# Patient Record
Sex: Male | Born: 1955 | Race: Black or African American | Hispanic: No | Smoking: Former smoker
Health system: Southern US, Community
[De-identification: ages and names within clinical notes are randomized; demographics above are authoritative.]

## PROBLEM LIST (undated history)

## (undated) DIAGNOSIS — Z860100 Personal history of colon polyps, unspecified: Secondary | ICD-10-CM

## (undated) DIAGNOSIS — R945 Abnormal results of liver function studies: Secondary | ICD-10-CM

## (undated) DIAGNOSIS — R350 Frequency of micturition: Secondary | ICD-10-CM

## (undated) DIAGNOSIS — M1990 Unspecified osteoarthritis, unspecified site: Secondary | ICD-10-CM

## (undated) DIAGNOSIS — D693 Immune thrombocytopenic purpura: Secondary | ICD-10-CM

## (undated) DIAGNOSIS — F32A Depression, unspecified: Secondary | ICD-10-CM

## (undated) DIAGNOSIS — F419 Anxiety disorder, unspecified: Secondary | ICD-10-CM

## (undated) DIAGNOSIS — K219 Gastro-esophageal reflux disease without esophagitis: Secondary | ICD-10-CM

## (undated) DIAGNOSIS — Z9081 Acquired absence of spleen: Secondary | ICD-10-CM

## (undated) DIAGNOSIS — D696 Thrombocytopenia, unspecified: Secondary | ICD-10-CM

## (undated) DIAGNOSIS — R600 Localized edema: Secondary | ICD-10-CM

## (undated) DIAGNOSIS — R7989 Other specified abnormal findings of blood chemistry: Secondary | ICD-10-CM

## (undated) DIAGNOSIS — F329 Major depressive disorder, single episode, unspecified: Secondary | ICD-10-CM

## (undated) DIAGNOSIS — I10 Essential (primary) hypertension: Secondary | ICD-10-CM

## (undated) DIAGNOSIS — E785 Hyperlipidemia, unspecified: Secondary | ICD-10-CM

## (undated) DIAGNOSIS — J302 Other seasonal allergic rhinitis: Secondary | ICD-10-CM

## (undated) HISTORY — DX: Immune thrombocytopenic purpura: D69.3

## (undated) HISTORY — DX: Personal history of colonic polyps: Z86.010

## (undated) HISTORY — PX: LACERATION REPAIR: SHX5168

## (undated) HISTORY — DX: Abnormal results of liver function studies: R94.5

## (undated) HISTORY — DX: Personal history of colon polyps, unspecified: Z86.0100

## (undated) HISTORY — DX: Hyperlipidemia, unspecified: E78.5

## (undated) HISTORY — DX: Acquired absence of spleen: Z90.81

## (undated) HISTORY — DX: Essential (primary) hypertension: I10

## (undated) HISTORY — PX: POLYPECTOMY: SHX149

## (undated) HISTORY — DX: Other seasonal allergic rhinitis: J30.2

## (undated) HISTORY — DX: Other specified abnormal findings of blood chemistry: R79.89

---
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
## 2001-08-25 ENCOUNTER — Ambulatory Visit (HOSPITAL_BASED_OUTPATIENT_CLINIC_OR_DEPARTMENT_OTHER): Admission: RE | Admit: 2001-08-25 | Discharge: 2001-08-25 | Payer: Self-pay | Admitting: Orthopaedic Surgery

## 2001-08-25 HISTORY — PX: SHOULDER ARTHROSCOPY: SHX128

## 2004-10-17 ENCOUNTER — Ambulatory Visit: Payer: Self-pay | Admitting: Internal Medicine

## 2004-10-30 ENCOUNTER — Ambulatory Visit: Payer: Self-pay | Admitting: Internal Medicine

## 2005-01-15 ENCOUNTER — Ambulatory Visit: Payer: Self-pay | Admitting: Internal Medicine

## 2005-10-25 ENCOUNTER — Ambulatory Visit: Payer: Self-pay | Admitting: Internal Medicine

## 2005-12-02 ENCOUNTER — Ambulatory Visit: Payer: Self-pay | Admitting: Internal Medicine

## 2005-12-17 ENCOUNTER — Encounter: Payer: Self-pay | Admitting: Internal Medicine

## 2005-12-17 ENCOUNTER — Encounter (INDEPENDENT_AMBULATORY_CARE_PROVIDER_SITE_OTHER): Payer: Self-pay | Admitting: *Deleted

## 2005-12-17 ENCOUNTER — Ambulatory Visit: Payer: Self-pay | Admitting: Internal Medicine

## 2005-12-18 LAB — HM COLONOSCOPY: HM Colonoscopy: NORMAL

## 2006-02-21 ENCOUNTER — Ambulatory Visit: Payer: Self-pay | Admitting: Internal Medicine

## 2007-08-10 DIAGNOSIS — I10 Essential (primary) hypertension: Secondary | ICD-10-CM

## 2007-08-12 ENCOUNTER — Ambulatory Visit: Payer: Self-pay | Admitting: Internal Medicine

## 2007-08-14 LAB — CONVERTED CEMR LAB
ALT: 33 units/L (ref 0–53)
AST: 30 units/L (ref 0–37)
Albumin: 4 g/dL (ref 3.5–5.2)
Alkaline Phosphatase: 73 units/L (ref 39–117)
BUN: 16 mg/dL (ref 6–23)
Bilirubin, Direct: 0.1 mg/dL (ref 0.0–0.3)
CO2: 31 meq/L (ref 19–32)
Calcium: 9.4 mg/dL (ref 8.4–10.5)
Chloride: 103 meq/L (ref 96–112)
Cholesterol: 209 mg/dL (ref 0–200)
Creatinine, Ser: 1.4 mg/dL (ref 0.4–1.5)
Direct LDL: 128 mg/dL
GFR calc Af Amer: 68 mL/min
GFR calc non Af Amer: 56 mL/min
Glucose, Bld: 82 mg/dL (ref 70–99)
HDL: 28.2 mg/dL — ABNORMAL LOW (ref >39.0)
Potassium: 4.1 meq/L (ref 3.5–5.1)
Sodium: 141 meq/L (ref 135–145)
Total Bilirubin: 0.7 mg/dL (ref 0.3–1.2)
Total CHOL/HDL Ratio: 7.4
Total Protein: 6.6 g/dL (ref 6.0–8.3)
Triglycerides: 261 mg/dL (ref 0–149)
VLDL: 52 mg/dL — ABNORMAL HIGH (ref 0–40)

## 2007-10-14 ENCOUNTER — Ambulatory Visit: Payer: Self-pay | Admitting: Internal Medicine

## 2007-10-20 LAB — CONVERTED CEMR LAB
ALT: 62 units/L — ABNORMAL HIGH (ref 0–53)
AST: 52 units/L — ABNORMAL HIGH (ref 0–37)
Albumin: 4.2 g/dL (ref 3.5–5.2)
Alkaline Phosphatase: 81 units/L (ref 39–117)
Bilirubin, Direct: 0.1 mg/dL (ref 0.0–0.3)
Cholesterol: 175 mg/dL (ref 0–200)
HDL: 40.1 mg/dL (ref >39.0)
LDL Cholesterol: 115 mg/dL — ABNORMAL HIGH (ref 0–99)
Total Bilirubin: 0.9 mg/dL (ref 0.3–1.2)
Total CHOL/HDL Ratio: 4.4
Total Protein: 7.4 g/dL (ref 6.0–8.3)
Triglycerides: 97 mg/dL (ref 0–149)
VLDL: 19 mg/dL (ref 0–40)

## 2008-04-15 ENCOUNTER — Ambulatory Visit: Payer: Self-pay | Admitting: Internal Medicine

## 2008-04-18 LAB — CONVERTED CEMR LAB
BUN: 16 mg/dL (ref 6–23)
CO2: 30 meq/L (ref 19–32)
Calcium: 9.4 mg/dL (ref 8.4–10.5)
Chloride: 108 meq/L (ref 96–112)
Cholesterol: 193 mg/dL (ref 0–200)
Creatinine, Ser: 1.2 mg/dL (ref 0.4–1.5)
GFR calc Af Amer: 81 mL/min
GFR calc non Af Amer: 67 mL/min
Glucose, Bld: 90 mg/dL (ref 70–99)
HDL: 32.2 mg/dL — ABNORMAL LOW (ref >39.0)
LDL Cholesterol: 129 mg/dL — ABNORMAL HIGH (ref 0–99)
Potassium: 4.1 meq/L (ref 3.5–5.1)
Sodium: 141 meq/L (ref 135–145)
Total CHOL/HDL Ratio: 6
Triglycerides: 159 mg/dL — ABNORMAL HIGH (ref 0–149)
VLDL: 32 mg/dL (ref 0–40)

## 2008-04-22 ENCOUNTER — Ambulatory Visit: Payer: Self-pay | Admitting: Internal Medicine

## 2008-09-12 ENCOUNTER — Telehealth: Payer: Self-pay | Admitting: *Deleted

## 2009-03-14 ENCOUNTER — Ambulatory Visit: Payer: Self-pay | Admitting: Internal Medicine

## 2009-03-14 LAB — CONVERTED CEMR LAB
Bilirubin Urine: NEGATIVE
Ketones, urine, test strip: NEGATIVE
Specific Gravity, Urine: 1.02
Urobilinogen, UA: 0.2

## 2009-03-16 LAB — CONVERTED CEMR LAB
BUN: 14 mg/dL (ref 6–23)
Basophils Relative: 5.5 % — ABNORMAL HIGH (ref 0.0–3.0)
Bilirubin, Direct: 0 mg/dL (ref 0.0–0.3)
CO2: 30 meq/L (ref 19–32)
Chloride: 104 meq/L (ref 96–112)
Cholesterol: 162 mg/dL (ref 0–200)
Creatinine, Ser: 1.2 mg/dL (ref 0.4–1.5)
Eosinophils Absolute: 0.1 10*3/uL (ref 0.0–0.7)
Glucose, Bld: 82 mg/dL (ref 70–99)
MCHC: 33.2 g/dL (ref 30.0–36.0)
MCV: 91 fL (ref 78.0–100.0)
Monocytes Absolute: 0.5 10*3/uL (ref 0.1–1.0)
Neutrophils Relative %: 45.3 % (ref 43.0–77.0)
PSA: 1.32 ng/mL (ref 0.10–4.00)
Platelets: 265 10*3/uL (ref 150.0–400.0)
Total Bilirubin: 0.9 mg/dL (ref 0.3–1.2)
Total Protein: 7.3 g/dL (ref 6.0–8.3)
Triglycerides: 102 mg/dL (ref 0.0–149.0)

## 2009-09-12 ENCOUNTER — Ambulatory Visit: Payer: Self-pay | Admitting: Internal Medicine

## 2009-10-06 ENCOUNTER — Ambulatory Visit: Payer: Self-pay | Admitting: Internal Medicine

## 2009-10-06 LAB — CONVERTED CEMR LAB
AST: 41 units/L — ABNORMAL HIGH (ref 0–37)
Basophils Absolute: 0.1 10*3/uL (ref 0.0–0.1)
CO2: 28 meq/L (ref 19–32)
Calcium: 9 mg/dL (ref 8.4–10.5)
Chloride: 105 meq/L (ref 96–112)
Eosinophils Absolute: 0.1 10*3/uL (ref 0.0–0.7)
Glucose, Bld: 96 mg/dL (ref 70–99)
HCT: 40.3 % (ref 39.0–52.0)
Hemoglobin: 13.5 g/dL (ref 13.0–17.0)
Lymphocytes Relative: 36.8 % (ref 12.0–46.0)
Lymphs Abs: 1.4 10*3/uL (ref 0.7–4.0)
MCHC: 33.4 g/dL (ref 30.0–36.0)
MCV: 93.1 fL (ref 78.0–100.0)
Monocytes Absolute: 0.5 10*3/uL (ref 0.1–1.0)
Neutro Abs: 1.6 10*3/uL (ref 1.4–7.7)
RDW: 13.2 % (ref 11.5–14.6)
Sodium: 143 meq/L (ref 135–145)
Total Bilirubin: 0.9 mg/dL (ref 0.3–1.2)

## 2009-10-13 ENCOUNTER — Ambulatory Visit: Payer: Self-pay | Admitting: Internal Medicine

## 2010-01-15 ENCOUNTER — Ambulatory Visit: Payer: Self-pay | Admitting: Internal Medicine

## 2010-01-23 ENCOUNTER — Telehealth: Payer: Self-pay | Admitting: *Deleted

## 2010-01-23 LAB — CONVERTED CEMR LAB
Albumin: 3.9 g/dL (ref 3.5–5.2)
Total Protein: 7.3 g/dL (ref 6.0–8.3)

## 2010-04-13 ENCOUNTER — Ambulatory Visit: Payer: Self-pay | Admitting: Internal Medicine

## 2010-04-13 LAB — CONVERTED CEMR LAB
ALT: 46 units/L (ref 0–53)
AST: 42 units/L — ABNORMAL HIGH (ref 0–37)
Albumin: 4.1 g/dL (ref 3.5–5.2)
Alkaline Phosphatase: 82 units/L (ref 39–117)
CO2: 30 meq/L (ref 19–32)
Chloride: 106 meq/L (ref 96–112)
Cholesterol: 145 mg/dL (ref 0–200)
Creatinine, Ser: 1.2 mg/dL (ref 0.4–1.5)
Direct LDL: 73 mg/dL
Eosinophils Relative: 2.2 % (ref 0.0–5.0)
HCT: 39.5 % (ref 39.0–52.0)
Hemoglobin: 13.3 g/dL (ref 13.0–17.0)
Lymphocytes Relative: 27.5 % (ref 12.0–46.0)
Lymphs Abs: 1.3 10*3/uL (ref 0.7–4.0)
Monocytes Relative: 13 % — ABNORMAL HIGH (ref 3.0–12.0)
Neutro Abs: 2.6 10*3/uL (ref 1.4–7.7)
PSA: 1.86 ng/mL (ref 0.10–4.00)
Potassium: 4.1 meq/L (ref 3.5–5.1)
RBC: 4.33 M/uL (ref 4.22–5.81)
TSH: 1.25 microintl units/mL (ref 0.35–5.50)
Total Bilirubin: 0.6 mg/dL (ref 0.3–1.2)
Total CHOL/HDL Ratio: 4
VLDL: 66.2 mg/dL — ABNORMAL HIGH (ref 0.0–40.0)
WBC: 4.7 10*3/uL (ref 4.5–10.5)

## 2010-04-20 ENCOUNTER — Ambulatory Visit: Payer: Self-pay | Admitting: Internal Medicine

## 2010-06-06 ENCOUNTER — Telehealth: Payer: Self-pay | Admitting: Internal Medicine

## 2010-10-17 ENCOUNTER — Ambulatory Visit: Payer: Self-pay | Admitting: Internal Medicine

## 2010-10-17 LAB — CONVERTED CEMR LAB
Bilirubin, Direct: 0.1 mg/dL (ref 0.0–0.3)
Total Bilirubin: 0.5 mg/dL (ref 0.3–1.2)
Total CHOL/HDL Ratio: 3.9
VLDL: 26 mg/dL (ref 0–40)

## 2010-10-26 ENCOUNTER — Ambulatory Visit: Payer: Self-pay | Admitting: Internal Medicine

## 2010-12-25 NOTE — Progress Notes (Signed)
Summary: Schedule Colonoscopy  Phone Note Outgoing Call Call back at [facility] Phone [phone]   Call placed by: Harlow Mares CMA Duncan Dull),  June 06, 2010 9:57 AM Call placed to: Patient Summary of Call: patient is due for a colonoscopy, due to pt having family hx of colon cancer. Left message on patients machine to call back.  Initial call taken by: Harlow Mares CMA Duncan Dull),  June 06, 2010 9:58 AM  Follow-up for Phone Call        patient states that he will call back and schedule   Follow-up by: Harlow Mares CMA Duncan Dull),  June 13, 2010 4:15 PM

## 2010-12-25 NOTE — Assessment & Plan Note (Signed)
Summary: 6 MO ROV/MM   Vital Signs:  Patient profile:   55 year old male Height:      68 inches Weight:      211 pounds BMI:     32.20 BP sitting:   120 / 80  (right arm)  Vitals Entered By: Kathrynn Speed CMA (Apr 13, 2010 8:36 AM)  Nutrition Counseling: Patient's BMI is greater than 25 and therefore counseled on weight management options. CC: Refills, remoles of moles, Hypertension Management   History of Present Illness: Scott Waters comes in today  for follow up of multiple medical problems . HT: no se of meds  and readings seem controlled .   trying to lose weight by  small frequent meals  Allergies : helped by the allegra  no sob  or wheeze LIPIDS: no se of meds and takign regularly .  SKin tags moles around neck and right axilla get irritated with his clothes and sometimes shear off .  would like removal.     Hypertension History:      He denies headache, chest pain, palpitations, dyspnea with exertion, peripheral edema, visual symptoms, neurologic problems, syncope, and side effects from treatment.  He notes no problems with any antihypertensive medication side effects.        Positive major cardiovascular risk factors include male age 11 years old or older, hyperlipidemia, and hypertension.  Negative major cardiovascular risk factors include non-tobacco-user status.      Preventive Screening-Counseling & Management  Alcohol-Tobacco     Alcohol drinks/day: 3     Alcohol type: vodka and beer     Alcohol Counseling: to decrease amount and/or frequency of alcohol intake     Smoking Status: quit     Year Quit: 2005  Caffeine-Diet-Exercise     Caffeine use/day: 2 -3     Does Patient Exercise: no  Current Medications (verified): 1)  Lisinopril-Hydrochlorothiazide 20-12.5 Mg  Tabs (Lisinopril-Hydrochlorothiazide) .... 2  By Mouth Once Daily. 2)  Zocor 40 Mg  Tabs (Simvastatin) .Marland Kitchen.. 1 By Mouth Once Daily 3)  Glucosamine Sulfate   Powd (Glucosamine Sulfate) 4)   Acetaminophen 500 Mg  Caps (Acetaminophen) 5)  Omega-3 1000 Mg  Caps (Omega-3 Fatty Acids) 6)  Flonase 50 Mcg/act  Susp (Fluticasone Propionate) .... 2 Sprays  Each Nares Once Daily 7)  Fexofenadine Hcl 180 Mg  Tabs (Fexofenadine Hcl) .Marland Kitchen.. 1 By Mouth Once Daily  For Allergy 8)  Vitamin C 500 Mg Tabs (Ascorbic Acid) .... Once Daily  Allergies (verified): No Known Drug Allergies  Past History:  Past medical, surgical, family and social histories (including risk factors) reviewed, and no changes noted (except as noted below).  Past Medical History: Reviewed history from 03/14/2009 and no changes required. Alleriges Hypertension Allergic rhinitis Hyperlipidemia Colonic polyps, hx of 2007    Past Surgical History: Reviewed history from 03/14/2009 and no changes required. Tonsillectomy  polypectomy   Family History: Reviewed history from 03/14/2009 and no changes required. Family History of Alcoholism/Addiction Family History of Colon CA 1st degree relative <60 father 73 died 29  Family History Diabetes 1st degree relative Family History Kidney disease  dyalisi from DM ht mom  Family History Psychiatric care Family History of Stroke M 1st degree relative <50   Social History: Reviewed history from 03/14/2009 and no changes required. Occupation:  Chartered certified accountant Married Former Smoker still  tobacco free Alcohol use-yes Drug use-no Regular exercise-yes HH of 3    no pets      Caffeine use/day:  2 -3  Review of Systems  The patient denies anorexia, fever, weight gain, vision loss, chest pain, syncope, dyspnea on exertion, peripheral edema, prolonged cough, headaches, hemoptysis, abdominal pain, melena, hematochezia, severe indigestion/heartburn, hematuria, muscle weakness, transient blindness, difficulty walking, depression, unusual weight change, abnormal bleeding, enlarged lymph nodes, and angioedema.    Physical Exam  General:  Well-developed,well-nourished,in no acute distress;  alert,appropriate and cooperative throughout examination Head:  normocephalic and atraumatic.   Eyes:  vision grossly intact, pupils equal, and pupils round.   Neck:  No deformities, masses, or tenderness noted. no bruits heard Lungs:  Normal respiratory effort, chest expands symmetrically. Lungs are clear to auscultation, no crackles or wheezes. Heart:  Normal rate and regular rhythm. S1 and S2 normal without gallop, murmur, click, rub or other extra sounds. Abdomen:  Bowel sounds positive,abdomen soft and non-tender without masses, organomegaly or   noted. Pulses:  pulses intact without delay  no bruits heard  Extremities:  no clubbing cyanosis or edema  Neurologic:  grossly non focal  Skin:  turgor normal, color normal, no ecchymoses, and no petechiae.    multiple achrochordon around neck and 2 right axilla no bleeding Cervical Nodes:  No lymphadenopathy noted Psych:  Oriented X3, good eye contact, not anxious appearing, and not depressed appearing.     Impression & Recommendations:  Problem # 1:  HYPERLIPIDEMIA (ICD-272.4) due for labs  His updated medication list for this problem includes:    Zocor 40 Mg Tabs (Simvastatin) .Marland Kitchen... 1 by mouth once daily  Orders: TLB-TSH (Thyroid Stimulating Hormone) (84443-TSH) TLB-Lipid Panel (80061-LIPID) TLB-Hepatic/Liver Function Pnl (80076-HEPATIC) Venipuncture (16109)  Labs Reviewed: SGOT: 36 (01/15/2010)   SGPT: 39 (01/15/2010)  Prior 10 Yr Risk Heart Disease: 6 % (10/13/2009)   HDL:42.20 (03/14/2009), 32.2 (04/15/2008)  LDL:99 (03/14/2009), 129 (60/45/4098)  Chol:162 (03/14/2009), 193 (04/15/2008)  Trig:102.0 (03/14/2009), 159 (04/15/2008)  Problem # 2:  HYPERTENSION (ICD-401.9) Assessment: Unchanged  His updated medication list for this problem includes:    Lisinopril-hydrochlorothiazide 20-12.5 Mg Tabs (Lisinopril-hydrochlorothiazide) .Marland Kitchen... 2  by mouth once daily.  Orders: TLB-BMP (Basic Metabolic Panel-BMET)  (80048-METABOL) Venipuncture (11914)  BP today: 120/80 Prior BP: 120/80 (10/13/2009)  Prior 10 Yr Risk Heart Disease: 6 % (10/13/2009)  Labs Reviewed: K+: 4.0 (10/06/2009) Creat: : 1.2 (10/06/2009)   Chol: 162 (03/14/2009)   HDL: 42.20 (03/14/2009)   LDL: 99 (03/14/2009)   TG: 102.0 (03/14/2009)  Problem # 3:  LEUKOPENIA, MILD (ICD-288.50) no infection    recheck labs  Orders: TLB-CBC Platelet - w/Differential (85025-CBCD)  Problem # 4:  ALLERGIC RHINITIS (ICD-477.9) Assessment: Unchanged continue meds  His updated medication list for this problem includes:    Flonase 50 Mcg/act Susp (Fluticasone propionate) .Marland Kitchen... 2 sprays  each nares once daily    Fexofenadine Hcl 180 Mg Tabs (Fexofenadine hcl) .Marland Kitchen... 1 by mouth once daily  for allergy  Problem # 5:  ACROCHORDON (ICD-701.9) multiple    irritated   resched for  removal.  Complete Medication List: 1)  Lisinopril-hydrochlorothiazide 20-12.5 Mg Tabs (Lisinopril-hydrochlorothiazide) .... 2  by mouth once daily. 2)  Zocor 40 Mg Tabs (Simvastatin) .Marland Kitchen.. 1 by mouth once daily 3)  Glucosamine Sulfate Powd (Glucosamine sulfate) 4)  Acetaminophen 500 Mg Caps (Acetaminophen) 5)  Omega-3 1000 Mg Caps (Omega-3 fatty acids) 6)  Flonase 50 Mcg/act Susp (Fluticasone propionate) .... 2 sprays  each nares once daily 7)  Fexofenadine Hcl 180 Mg Tabs (Fexofenadine hcl) .Marland Kitchen.. 1 by mouth once daily  for allergy 8)  Vitamin C 500  Mg Tabs (Ascorbic acid) .... Once daily  Other Orders: TLB-PSA (Prostate Specific Antigen) (84153-PSA)  Hypertension Assessment/Plan:      The patient's hypertensive risk group is category B: At least one risk factor (excluding diabetes) with no target organ damage.  His calculated 10 year risk of coronary heart disease is 6 %.  Today's blood pressure is 120/80.  His blood pressure goal is < 140/90.  Patient Instructions: 1)  schedule for mole skin tag removals    2)  can make a slot    in afternoon that fits with his  schedule .   3)  You will be informed of lab results when available. and then  4)  plan follow up . Prescriptions: FEXOFENADINE HCL 180 MG  TABS (FEXOFENADINE HCL) 1 by mouth once daily  for allergy  #30 Tablet x 12   Entered and Authorized by:   Madelin Headings MD   Signed by:   Madelin Headings MD on 04/13/2010   Method used:   Electronically to        [facility] (retail)       [address]       [place], [place]  [id number]       Ph: [phone]       Fax: [phone]   RxID:   [id number] ZOCOR 40 MG  TABS (SIMVASTATIN) 1 by mouth once daily  #30 Tablet x 12   Entered and Authorized by:   Madelin Headings MD   Signed by:   Madelin Headings MD on 04/13/2010   Method used:   Electronically to        [facility] (retail)       [address]       [place], [place]  [id number]       Ph: [phone]       Fax: [phone]   RxID:   [id number]

## 2010-12-25 NOTE — Assessment & Plan Note (Signed)
Summary: 6 MONTH FUP//CCM   Vital Signs:  Patient profile:   55 year old male Weight:      212 pounds Temp:     100.1 degrees F oral Pulse rate:   84 / minute Pulse rhythm:   regular BP sitting:   130 / 90  (right arm) Cuff size:   regular  Vitals Entered By: Kern Reap CMA Duncan Dull) (October 26, 2010 8:39 AM)  Serial Vital Signs/Assessments:  Time      Position  BP       Pulse  Resp  Temp     By                     130/78                         Madelin Headings MD  Comments: large cuff sitting By: Madelin Headings MD   CC: follow-up visit Is Patient Diabetic? No Pain Assessment Patient in pain? no        History of Present Illness: Scott Waters comes in today  for   follow up of BP and  LIPIDs  BP doing ok and no se but ran out of med LIPDS: no se of meds  Acitivy high at work feels good no sp sob or swelling or cough. Allergy on nose stpray allegra but hard to get  cause OTC.   Preventive Screening-Counseling & Management  Alcohol-Tobacco     Alcohol drinks/day: 3     Alcohol type: vodka and beer     Alcohol Counseling: to decrease amount and/or frequency of alcohol intake     Smoking Status: quit     Year Quit: 2005  Current Problems (verified): 1)  Leukopenia, Mild  (ICD-288.50) 2)  Colonic Polyps, Hx of  (ICD-V12.72) 3)  Health Maintenance Exam, Adult  (ICD-V70.0) 4)  Hyperlipidemia  (ICD-272.4) 5)  Allergic Rhinitis  (ICD-477.9) 6)  Family History Diabetes 1st Degree Relative  (ICD-V18.0) 7)  Family History of Colon Ca 1st Degree Relative <60  (ICD-V16.0) 8)  Family History of Alcoholism/addiction  (ICD-V61.41) 9)  Hypertension  (ICD-401.9)  Allergies (verified): No Known Drug Allergies  Past History:  Past medical, surgical, family and social histories (including risk factors) reviewed, and no changes noted (except as noted below).  Past Medical History: Reviewed history from 03/14/2009 and no changes  required. Alleriges Hypertension Allergic rhinitis Hyperlipidemia Colonic polyps, hx of 2007    Past Surgical History: Reviewed history from 03/14/2009 and no changes required. Tonsillectomy  polypectomy   Family History: Reviewed history from 03/14/2009 and no changes required. Family History of Alcoholism/Addiction Family History of Colon CA 1st degree relative <60 father 20 died 25  Family History Diabetes 1st degree relative Family History Kidney disease  dyalisi from DM ht mom  Family History Psychiatric care Family History of Stroke M 1st degree relative <50   Social History: Reviewed history from 03/14/2009 and no changes required. Occupation:  Chartered certified accountant Married Former Smoker still  tobacco free Alcohol use-yes Drug use-no Regular exercise-yes HH of 3    no pets       Review of Systems       neg cv pulm gi    no fever   temp poss from  drink before   Physical Exam  General:  Well-developed,well-nourished,in no acute distress; alert,appropriate and cooperative throughout examination Head:  normocephalic and atraumatic.   Neck:  No deformities, masses,  or tenderness noted. Lungs:  Normal respiratory effort, chest expands symmetrically. Lungs are clear to auscultation, no crackles or wheezes. Heart:  Normal rate and regular rhythm. S1 and S2 normal without gallop, murmur, click, rub or other extra sounds. Extremities:  neg edema Neurologic:  non focal  Skin:  turgor normal, color normal, and no ecchymoses.   Cervical Nodes:  No lymphadenopathy noted Psych:  Oriented X3, normally interactive, and good eye contact.     Impression & Recommendations:  Problem # 1:  HYPERTENSION (ICD-401.9) out of med for a few days     monitor  His updated medication list for this problem includes:    Lisinopril-hydrochlorothiazide 20-12.5 Mg Tabs (Lisinopril-hydrochlorothiazide) .Marland Kitchen... 2  by mouth once daily.  BP today: 130/90 Prior BP: 142/82 (04/20/2010)  Prior 10 Yr Risk  Heart Disease: 6 % (10/13/2009)  Labs Reviewed: K+: 4.1 (04/13/2010) Creat: : 1.2 (04/13/2010)   Chol: 166 (10/17/2010)   HDL: 43 (10/17/2010)   LDL: 97 (10/17/2010)   TG: 132 (10/17/2010)  Problem # 2:  HYPERLIPIDEMIA (ICD-272.4) Assessment: Improved  His updated medication list for this problem includes:    Zocor 40 Mg Tabs (Simvastatin) .Marland Kitchen... 1 by mouth once daily  Labs Reviewed: SGOT: 33 (10/17/2010)   SGPT: 38 (10/17/2010)  Prior 10 Yr Risk Heart Disease: 6 % (10/13/2009)   HDL:43 (10/17/2010), 37.90 (04/13/2010)  LDL:97 (10/17/2010), 99 (03/14/2009)  Chol:166 (10/17/2010), 145 (04/13/2010)  Trig:132 (10/17/2010), 331.0 (04/13/2010)  Complete Medication List: 1)  Lisinopril-hydrochlorothiazide 20-12.5 Mg Tabs (Lisinopril-hydrochlorothiazide) .... 2  by mouth once daily. 2)  Zocor 40 Mg Tabs (Simvastatin) .Marland Kitchen.. 1 by mouth once daily 3)  Glucosamine Sulfate Powd (Glucosamine sulfate) 4)  Acetaminophen 500 Mg Caps (Acetaminophen) 5)  Omega-3 1000 Mg Caps (Omega-3 fatty acids) 6)  Flonase 50 Mcg/act Susp (Fluticasone propionate) .... 2 sprays  each nares once daily 7)  Fexofenadine Hcl 180 Mg Tabs (Fexofenadine hcl) .Marland Kitchen.. 1 by mouth once daily  for allergy 8)  Vitamin C 500 Mg Tabs (Ascorbic acid) .... Once daily  Patient Instructions: 1)  continue  medications. 2)  healthy eating  get sleep.  3)  CPX with labs  in 6 months.  Prescriptions: LISINOPRIL-HYDROCHLOROTHIAZIDE 20-12.5 MG  TABS (LISINOPRIL-HYDROCHLOROTHIAZIDE) 2  by mouth once daily.  #180 Tablet x 3   Entered and Authorized by:   Madelin Headings MD   Signed by:   Madelin Headings MD on 10/26/2010   Method used:   Electronically to        [facility] [address] (retail)       [address]       [place], [place]  [id number]       Ph: [phone]       Fax: [phone]   RxID:   [id number]    Orders Added: 1)  Est. Patient Level III [[address]253]

## 2010-12-25 NOTE — Procedures (Signed)
Summary: Colonoscopy / [facility]  Colonoscopy /  Healthcare   Imported By: Lennie Odor 05/10/2010 16:20:06  _____________________________________________________________________  External Attachment:    Type:   Image     Comment:   External Document

## 2010-12-25 NOTE — Progress Notes (Signed)
Summary: cell ph #  Phone Note Call from Patient   Caller: Spouse Summary of Call: cell ph [phone] Initial call taken by: Raechel Ache, RN,  January 23, 2010 2:48 PM  Follow-up for Phone Call        See lab results. Follow-up by: Romualdo Bolk, CMA (AAMA),  January 23, 2010 3:26 PM

## 2010-12-25 NOTE — Assessment & Plan Note (Signed)
Summary: skin tag removal--ok per dr Amauri Keefe//ccm   Vital Signs:  Patient profile:   55 year old male Weight:      214 pounds Temp:     98.6 degrees F Resp:     12 per minute BP sitting:   142 / 82  Vitals Entered By: Lynann Beaver CMA (Apr 20, 2010 4:28 PM) CC: Skin tag excision Is Patient Diabetic? No Pain Assessment Patient in pain? no        History of Present Illness: Scott Waters comesin  for skin tag removal  ( see last ov)   No change in health since last visit    Procedure Note Last Tetanus: Tdap (03/14/2009)  Skin Tag Removal: Onset of lesion: > 3 months Indication: inflamed lesion Consent signed: no  Procedure # 1: skin tag(s) removed    Location: neck and   right axilla     # lesions removed: 10    Instrument used: scissors    Anesthesia: 1% lidocaine w/epinephrine    Comment: drysol  local care   Additional Instructions: local care  antibioitc ointment   recheck as needed for this   Current Medications (verified): 1)  Lisinopril-Hydrochlorothiazide 20-12.5 Mg  Tabs (Lisinopril-Hydrochlorothiazide) .... 2  By Mouth Once Daily. 2)  Zocor 40 Mg  Tabs (Simvastatin) .Marland Kitchen.. 1 By Mouth Once Daily 3)  Glucosamine Sulfate   Powd (Glucosamine Sulfate) 4)  Acetaminophen 500 Mg  Caps (Acetaminophen) 5)  Omega-3 1000 Mg  Caps (Omega-3 Fatty Acids) 6)  Flonase 50 Mcg/act  Susp (Fluticasone Propionate) .... 2 Sprays  Each Nares Once Daily 7)  Fexofenadine Hcl 180 Mg  Tabs (Fexofenadine Hcl) .Marland Kitchen.. 1 By Mouth Once Daily  For Allergy 8)  Vitamin C 500 Mg Tabs (Ascorbic Acid) .... Once Daily  Allergies (verified): No Known Drug Allergies   Complete Medication List: 1)  Lisinopril-hydrochlorothiazide 20-12.5 Mg Tabs (Lisinopril-hydrochlorothiazide) .... 2  by mouth once daily. 2)  Zocor 40 Mg Tabs (Simvastatin) .Marland Kitchen.. 1 by mouth once daily 3)  Glucosamine Sulfate Powd (Glucosamine sulfate) 4)  Acetaminophen 500 Mg Caps (Acetaminophen) 5)  Omega-3 1000 Mg Caps  (Omega-3 fatty acids) 6)  Flonase 50 Mcg/act Susp (Fluticasone propionate) .... 2 sprays  each nares once daily 7)  Fexofenadine Hcl 180 Mg Tabs (Fexofenadine hcl) .Marland Kitchen.. 1 by mouth once daily  for allergy 8)  Vitamin C 500 Mg Tabs (Ascorbic acid) .... Once daily  Other Orders: Removal of Skin Tags up to 15 Lesions (11200)  Patient Instructions: 1)  rov in 6 months   Appended Document: skin tag removal--ok per dr Scott Waters//ccm lTell patient that labs are  normal except minor liver  elevation and  very elevated triglycerides .blood count is now normal .   Can send copy of labs to patient and advise to minimize  sweets sugars and animal fats and simple carbohydrates . Increase exercise .  ROV in 6 months as we discussed  and should repeat lipids and liver before that visit .  Appended Document: skin tag removal--ok per dr Scott Waters//ccm LMTOCB  Appended Document: skin tag removal--ok per dr Scott Waters//ccm Pt aware and will call back to schedule the lab appt 1 week prior to appt.

## 2010-12-29 ENCOUNTER — Other Ambulatory Visit: Payer: Self-pay | Admitting: Internal Medicine

## 2011-04-12 NOTE — Op Note (Signed)
[place]  Patient:    Scott Waters, Scott Waters Visit Number: [id number] MRN: [id number]          Service Type: DSU Location: [facility] Attending Physician:  Marcene Corning Proc. Date: 08/25/01 Admit Date:  08/25/2001                             Operative Report  PREOPERATIVE DIAGNOSIS:  Left shoulder impingement.  POSTOPERATIVE DIAGNOSIS:  Left shoulder impingement.  PROCEDURE:  Left shoulder arthroscopic acromioplasty.  Left shoulder arthroscopic debridement.  ANESTHESIA:  General and block.  SURGEON:  Lubertha Basque. Jerl Santos, M.D.  ASSISTANT:  Prince Rome, P.A.  INDICATIONS:  The patient is a 54 year old man with about a year of left greater than right shoulder pain.  This occurs mostly with overhead use and it makes it very difficult for him to sleep at night.  He failed some oral anti-inflammatories.  By x-ray he has a very large subacromial spur.  He is offered an arthroscopy.  The procedure was discussed with the patient and informed consent was obtained under discussion of possible complications of, reaction to anesthesia, and infection.  DESCRIPTION OF PROCEDURE:  The patient was taken to the operating room after preanesthetic block was given.  He was then given a general anesthesia and positioned in beach chair position.  He was prepped and draped in the usual sterile fashion.  After administration of preoperative IV antibiotics, an arthroscopy of the left shoulder was performed through a total of two portals. Glenohumeral joint showed no degenerative change in the biceps tendon and all labral structures were well attached.  The rotator cuff appeared benign on undersurface inspection.  From above he had some bursitis and perhaps some partial thickness cuff tearing.  This was addressed with a brief debridement. He had a very prominent subacromial morphology with a spur in the anterior aspect.  This was addressed with an acromioplasty.   The corticoacromial ligament was released, but not resected.  The acromioplasty was done with a bur in the lateral position followed by transfer of the bur to the posterior position.  The [facility] joint was not addressed as he had no pain in that area and no significant spurring on the undersurface of this joint.  The soulder was thoroughly irrigated followed by the placement of Marcaine with epinephrine and morphine.  Simple sutures of nylon were used to reapproximate the portals followed by Adaptic and a dry gauze dressing and tape.  Estimated blood loss and intraoperative fluids can be obtained from anesthesia records.  DISPOSITION:  The patient was extubated in the operating room and taken to the recovery room in stable condition.  Plans were for him to go home the same day and to follow up in the office in less than a week.  I will contact him by phone tonight. Attending Physician:  Marcene Corning DD:  08/25/01 TD:  08/25/01 Job: [id number]

## 2011-04-16 ENCOUNTER — Encounter: Payer: Self-pay | Admitting: Internal Medicine

## 2011-04-16 ENCOUNTER — Other Ambulatory Visit (INDEPENDENT_AMBULATORY_CARE_PROVIDER_SITE_OTHER): Payer: BC Managed Care – PPO | Admitting: Internal Medicine

## 2011-04-16 DIAGNOSIS — Z0000 Encounter for general adult medical examination without abnormal findings: Secondary | ICD-10-CM

## 2011-04-16 LAB — LIPID PANEL
Cholesterol: 148 mg/dL (ref 0–200)
Triglycerides: 195 mg/dL — ABNORMAL HIGH (ref 0.0–149.0)

## 2011-04-16 LAB — TSH: TSH: 1.9 u[IU]/mL (ref 0.35–5.50)

## 2011-04-16 LAB — BASIC METABOLIC PANEL
BUN: 18 mg/dL (ref 6–23)
Creatinine, Ser: 1.1 mg/dL (ref 0.4–1.5)
GFR: 88.69 mL/min (ref >60.00)
Glucose, Bld: 97 mg/dL (ref 70–99)
Potassium: 4.4 mEq/L (ref 3.5–5.1)

## 2011-04-16 LAB — POCT URINALYSIS DIPSTICK
Glucose, UA: NEGATIVE
Ketones, UA: NEGATIVE
Spec Grav, UA: 1.02

## 2011-04-16 LAB — HEPATIC FUNCTION PANEL
ALT: 47 U/L (ref 0–53)
AST: 39 U/L — ABNORMAL HIGH (ref 0–37)
Albumin: 3.9 g/dL (ref 3.5–5.2)

## 2011-04-16 LAB — CBC WITH DIFFERENTIAL/PLATELET
Basophils Absolute: 0 10*3/uL (ref 0.0–0.1)
Basophils Relative: 1.1 % (ref 0.0–3.0)
Eosinophils Relative: 5.6 % — ABNORMAL HIGH (ref 0.0–5.0)
Hemoglobin: 13.2 g/dL (ref 13.0–17.0)
Lymphocytes Relative: 35.2 % (ref 12.0–46.0)
Monocytes Relative: 16 % — ABNORMAL HIGH (ref 3.0–12.0)
Neutro Abs: 1.8 10*3/uL (ref 1.4–7.7)
RBC: 4.38 Mil/uL (ref 4.22–5.81)
WBC: 4.3 10*3/uL — ABNORMAL LOW (ref 4.5–10.5)

## 2011-04-16 LAB — PSA: PSA: 1.69 ng/mL (ref 0.10–4.00)

## 2011-04-16 NOTE — Progress Notes (Signed)
Addended by: Rossie Muskrat on: 04/16/2011 08:30 AM   Modules accepted: Orders

## 2011-04-19 ENCOUNTER — Other Ambulatory Visit: Payer: Self-pay | Admitting: Internal Medicine

## 2011-04-23 ENCOUNTER — Ambulatory Visit (INDEPENDENT_AMBULATORY_CARE_PROVIDER_SITE_OTHER): Payer: BC Managed Care – PPO | Admitting: Internal Medicine

## 2011-04-23 ENCOUNTER — Encounter: Payer: Self-pay | Admitting: Internal Medicine

## 2011-04-23 VITALS — BP 144/90 | HR 88 | Ht 67.5 in | Wt 216.0 lb

## 2011-04-23 DIAGNOSIS — M1990 Unspecified osteoarthritis, unspecified site: Secondary | ICD-10-CM

## 2011-04-23 DIAGNOSIS — Z0000 Encounter for general adult medical examination without abnormal findings: Secondary | ICD-10-CM

## 2011-04-23 DIAGNOSIS — I10 Essential (primary) hypertension: Secondary | ICD-10-CM

## 2011-04-23 DIAGNOSIS — J309 Allergic rhinitis, unspecified: Secondary | ICD-10-CM

## 2011-04-23 DIAGNOSIS — E785 Hyperlipidemia, unspecified: Secondary | ICD-10-CM

## 2011-04-23 DIAGNOSIS — Z86010 Personal history of colonic polyps: Secondary | ICD-10-CM

## 2011-04-23 DIAGNOSIS — R945 Abnormal results of liver function studies: Secondary | ICD-10-CM

## 2011-04-23 DIAGNOSIS — D72819 Decreased white blood cell count, unspecified: Secondary | ICD-10-CM

## 2011-04-23 NOTE — Patient Instructions (Signed)
Your blood pressure is elevated today. Poss from taking the ibuprofen that can interfere with the BP medication. For now stop the supplement in case there is an issue with this. Check you own readings at least 3 x per week.    Then ROV  In about  4-5 week   And bring in machine . If  BP is 155 and over call and we may add medication in addition to above.  Avoid salt and sodium products.

## 2011-04-23 NOTE — Progress Notes (Signed)
  Subjective:    Patient ID: Scott Waters, male    DOB: 1956/11/15, 55 y.o.   MRN: [id number]  HPI Comes in today for PV and also fu of medical issues . Since last visit he had a joint problem  ? Gout 2 months ago.    Right big toe  Was painful and  and used soaking and epsoms and finally went away  .  NO hx of gout.  No other redness. Has had some other foot toenail issue but this was different.   BP:  Usu 130 128     Has been taking  A supplement   For joint .   Allergy stable   Review of Systems Knees  12 system review neg cv pulmonary...nocturia x 1 snores but no OSA  Hx of right great toe injury.    Objective:   Physical Exam Physical Exam: Vital signs reviewed XLK:GMWN is a well-developed well-nourished alert cooperative   AAmale  who appears   stated age in no acute distress.  HEENT: normocephalic  traumatic , Eyes: PERRL EOM's full, conjunctiva clear, Nares: patent no deformity discharge or tenderness., Ears: no deformity EAC's clear TMs with normal landmarks. Mouth: clear OP, no lesions, edema.  Moist mucous membranes. Dentition in adequate repair. NECK: supple without masses, thyromegaly or bruit CHEST/PULM:  Clear to auscultation and percussion breath sounds equal no wheeze , rales or rhonchi. No chest wall deformities or tenderness. CV: PMI is nondisplaced, S1 S2 no gallops, murmurs, rubs. Peripheral pulses are full without delay.No JVD .  See bp readings  ABDOMEN: Bowel sounds normal nontender  No guard or rebound, no hepato splenomegal no CVA tenderness.  No hernia. Extremtities:  No clubbing cyanosis or edema, no acute joint swelling or redness no focal atrophy NEURO:  Oriented x3, cranial nerves 3-12 appear to be intact, no obvious focal weakness,gait within normal limits no abnormal reflexes or asymmetrical SKIN: No acute rashes normal turgor, color, no bruising or petechiae. PSYCH: Oriented, good eye contact, no obvious depression anxiety, cognition and judgment appear  normal. LN:  No cervical axillary or inguinal adenopathy Rectal no masses prostate 1+ no nodules    Labs reviewed  . EKG NSR  Assessment & Plan:  Preventive Health Care Counseled regarding healthy nutrition, exercise, sleep, injury prevention, calcium vit d and healthy weight .  UTD Hypertension  A bit up today Lipids look good today Joint pains  / mild djd  And using  nsaids ibu Lfts up and down poss from the nsaid or other will folllow ... WBC about the same follow yearly.

## 2011-05-24 ENCOUNTER — Ambulatory Visit (INDEPENDENT_AMBULATORY_CARE_PROVIDER_SITE_OTHER): Payer: BC Managed Care – PPO | Admitting: Internal Medicine

## 2011-05-24 ENCOUNTER — Encounter: Payer: Self-pay | Admitting: Internal Medicine

## 2011-05-24 VITALS — BP 151/104 | HR 83 | Wt 215.0 lb

## 2011-05-24 DIAGNOSIS — I10 Essential (primary) hypertension: Secondary | ICD-10-CM

## 2011-05-24 MED ORDER — VERAPAMIL HCL ER 180 MG PO TBCR: 180.0000 mg | EXTENDED_RELEASE_TABLET | Freq: Every day | ORAL | Status: DC

## 2011-05-24 NOTE — Progress Notes (Signed)
  Subjective:    Patient ID: Scott Waters, male    DOB: 09/15/1956, 55 y.o.   MRN: [id number]  HPI Patient comes in today for followup as directed for his high blood pressure. Since his last visit he has his own machine and has been checking his readings and they tend to be in the 1 40-150 range. He is taking his medicine regularly and has no side effect. New change in his health status.   Review of Systems Negative for chest pain shortness of breath syncope unusual swelling or bleeding Past history family history social history reviewed in the electronic medical record.     Objective:   Physical Exam Well-developed well-nourished in no acute distress blood pressure readings reviewed patient  Bp machine 151/104 Nursing 140/100  Left  ... MD  right 146/96        Assessment & Plan:  HT  Not controlled  Although poss slight WC phenom  Disc options  Will add on verapamil 180 hs and then follow up rov  in 2 months or so  He may be due for bmp and lfts at that point.

## 2011-05-24 NOTE — Patient Instructions (Signed)
Add on  Verapamil medication for BP  Continue monitoring bp reading.   Return in 2 months  To reevaluate but call in the meantime  If very high   160 and up.

## 2011-07-22 ENCOUNTER — Ambulatory Visit (INDEPENDENT_AMBULATORY_CARE_PROVIDER_SITE_OTHER): Payer: BC Managed Care – PPO | Admitting: Internal Medicine

## 2011-07-22 ENCOUNTER — Encounter: Payer: Self-pay | Admitting: Internal Medicine

## 2011-07-22 VITALS — BP 170/90 | HR 72 | Wt 217.0 lb

## 2011-07-22 DIAGNOSIS — I10 Essential (primary) hypertension: Secondary | ICD-10-CM

## 2011-07-22 MED ORDER — CHLORTHALIDONE 25 MG PO TABS: 25.0000 mg | ORAL_TABLET | Freq: Every day | ORAL | Status: DC

## 2011-07-22 NOTE — Patient Instructions (Addendum)
Continue the verapamil  Add low dose diuretic as we discussed  Water is ok but decrease  Salt   Sodium  And increase potassium rich foods.  Recheck in 6-8 weeks .  Or as needed  Sodium-Controlled Diet Sodium is a mineral. It is found in many foods. Sodium may be found naturally or added during the making of a food. The most common form of sodium is salt, which is made up of sodium and chloride. Reducing your sodium intake involves changing your eating habits. The following guidelines will help you reduce the sodium in your diet:  Stop using the salt shaker.   Use salt sparingly in cooking and baking.   Substitute with sodium-free seasonings and spices.   Do not use a salt substitute (potassium chloride) without your caregiver's permission.   Include a variety of fresh, unprocessed foods in your diet.   Limit the use of processed and convenience foods that are high in sodium.  USE THE FOLLOWING FOODS SPARINGLY: Breads/Starches  Commercial bread stuffing, commercial pancake or waffle mixes, coating mixes. Waffles. Croutons. Prepared (boxed or frozen) potato, rice, or noodle mixes that contain salt or sodium. Salted [place] fries or hash browns. Salted popcorn, breads, crackers, chips, or snack foods.  Vegetables  Vegetables canned with salt or prepared in cream, butter, or cheese sauces. Sauerkraut. Tomato or vegetable juices canned with salt.   Fresh vegetables are allowed if rinsed thoroughly.  Fruit  Fruit is okay to eat.  Meat and Meat Substitutes  Salted or smoked meats, such as bacon or Canadian bacon, chipped or corned beef, hot dogs, salt pork, luncheon meats, pastrami, ham, or sausage. Canned or smoked fish, poultry, or meat. Processed cheese or cheese spreads, blue or Roquefort cheese. Battered or frozen fish products. Prepared spaghetti sauce. Baked beans. Reuben sandwiches. Salted nuts. Caviar.  Milk  Limit buttermilk to 1 cup per week.  Soups and Combination  Foods  Bouillon cubes, canned or dried soups, broth, consomm. Convenience (frozen or packaged) dinners with more than 600 milligrams (mg) sodium. Pot pies, pizza, Asian food, fast food cheeseburgers, and specialty sandwiches.  Desserts and Sweets  Regular (salted) desserts, pie, commercial fruit snack pies, commercial snack cakes, canned puddings.   Eat desserts and sweets in moderation.  Fats and Oils  Gravy mixes or canned gravy. No more than 1 to 2 tablespoons of salad dressing. Chip dips.   Eat fats and oils in moderation.  Beverages  See those listed under the vegetables and milk groups.  Condiments/Miscellaneous  Ketchup, mustard, meat sauces, salsa, regular (salted) and lite soy sauce or mustard. Dill pickles, olives, meat tenderizer. Prepared horseradish or pickle relish. Dutch-processed cocoa. Baking powder or baking soda used medicinally. Worcestershire sauce. "Light" salt. Salt substitute, unless approved by your caregiver.  Document Released: 05/03/2002 Document Re-Released: 02/05/2010 [facility] Patient Information 2011 [place], [place].

## 2011-07-22 NOTE — Progress Notes (Signed)
  Subjective:    Patient ID: Scott Waters, male    DOB: [birth year], 55 y.o.   MRN: [id number]  HPI comesin for follow up of hypertension since last visit he has increased the verapamil without sig se . Bp high at home.   Also 170   TO 150 AT HOME  Stress could be contributing.  Sleep  Ok  Does use etoh every night 2-3  . No osa . No cp sob swelling  Is snot taking the Lis hctz.  ? thoguth we said to stop cause not working [employer] Neg change vision cp sob some ha feels ok. Just stresssed. Past history family history social history reviewed in the electronic medical record.     Objective:   Physical Exam  WDWN in nad  Repeat bp right arm  160/82  Large cuff sitting Wt Readings from Last 3 Encounters:  07/22/11 217 lb (98.431 kg)  05/24/11 215 lb (97.523 kg)  04/23/11 216 lb (97.977 kg)   Boots on today Chest:  Clear to A&P without wheezes rales or rhonchi CV:  S1-S2 no gallops or murmurs peripheral perfusion is normal Oriented x 3 and no noted deficits in memory, attention, and speech.  Record review  For med tracking very confusing in EPIC EHR ...  Much time spent    Assessment & Plan:  Hypertension   Not controlled but now only on monotherapy and  I don't remember nor see in record that we asked to dc the past med although not extremely effective.   No se noted. Poss miscommunication .  Disc about this limit  etoh contributing stress also dec salt in diet  And increase fruits and veges.  Will add low dose chlorthalidone and monitor the potassium  .   Take in am to avoid urinary disruption.  Total visit > 50% spent counseling and coordinating care

## 2011-07-27 NOTE — Assessment & Plan Note (Signed)
Unclear why on mono therapy poss miscommunication  Was on lis hctz without se .  Will add low dose chlorthalidone , dec sodium inc fruits veges and limit etoh and the follow up.

## 2011-08-19 ENCOUNTER — Other Ambulatory Visit: Payer: Self-pay | Admitting: Internal Medicine

## 2011-09-09 ENCOUNTER — Encounter: Payer: Self-pay | Admitting: Internal Medicine

## 2011-09-09 ENCOUNTER — Ambulatory Visit (INDEPENDENT_AMBULATORY_CARE_PROVIDER_SITE_OTHER): Payer: BC Managed Care – PPO | Admitting: Internal Medicine

## 2011-09-09 VITALS — BP 152/80 | HR 80 | Wt 215.0 lb

## 2011-09-09 DIAGNOSIS — R7989 Other specified abnormal findings of blood chemistry: Secondary | ICD-10-CM

## 2011-09-09 DIAGNOSIS — Z9189 Other specified personal risk factors, not elsewhere classified: Secondary | ICD-10-CM

## 2011-09-09 DIAGNOSIS — Z5739 Occupational exposure to other air contaminants: Secondary | ICD-10-CM

## 2011-09-09 DIAGNOSIS — R945 Abnormal results of liver function studies: Secondary | ICD-10-CM

## 2011-09-09 DIAGNOSIS — I10 Essential (primary) hypertension: Secondary | ICD-10-CM

## 2011-09-09 LAB — BASIC METABOLIC PANEL
GFR: 81.67 mL/min (ref >60.00)
Glucose, Bld: 102 mg/dL — ABNORMAL HIGH (ref 70–99)
Potassium: 3.1 mEq/L — ABNORMAL LOW (ref 3.5–5.1)
Sodium: 139 mEq/L (ref 135–145)

## 2011-09-09 LAB — HEPATIC FUNCTION PANEL
AST: 43 U/L — ABNORMAL HIGH (ref 0–37)
Albumin: 4.2 g/dL (ref 3.5–5.2)
Alkaline Phosphatase: 80 U/L (ref 39–117)
Bilirubin, Direct: 0 mg/dL (ref 0.0–0.3)

## 2011-09-09 LAB — MAGNESIUM: Magnesium: 2.1 mg/dL (ref 1.5–2.5)

## 2011-09-09 MED ORDER — VERAPAMIL HCL ER 240 MG PO TBCR: 240.0000 mg | EXTENDED_RELEASE_TABLET | Freq: Every day | ORAL | Status: DC

## 2011-09-09 NOTE — Assessment & Plan Note (Signed)
Better  Nl with large cuff slightly elevated with reg cuff.  Increase to 240 verapamil and check bmp mg today .  Continue lifestyle intervention healthy eating and exercise . And then follow up.

## 2011-09-09 NOTE — Patient Instructions (Addendum)
Increase the verapamil to 240 mg per day and stay on the chlorthalidone.   Recheck in 3 months or as needed. Will notify you  of labs when available.

## 2011-09-09 NOTE — Progress Notes (Signed)
  Subjective:    Patient ID: Scott Waters, male    DOB: 14-Feb-1956, 55 y.o.   MRN: [id number]  HPI Pt comes in for follow up of HT management.  Since last visit has begun chlorthalidone . No se but some incr urination. No cp sob  Has had ur congestion .Marland Kitchen Dust and smoked from work machines  . No fever wheezing   Review of Systems See  Above.   No cramps other change in health Past history family history social history reviewed in the electronic medical record.     Objective:   Physical Exam  Wt Readings from Last 3 Encounters:  09/09/11 215 lb (97.523 kg)  07/22/11 217 lb (98.431 kg)  05/24/11 215 lb (97.523 kg)   wdwn in nad Respirations quiet   BP readings 140/84 right reg cuff 128/72 large cuff sitting  Cv rr No clubbing cyanosis or edema.      Assessment & Plan:  HT  Better increase to 240 verapamil and Continue lifestyle intervention healthy eating and exercise .  Labs today and recheck in 3 months Occupational exposures  With resp sx  Wear mask  And consider see pulm if having any chronic sx.   Routine monitoring of lfts  Minor abnormalities.

## 2011-09-11 ENCOUNTER — Telehealth: Payer: Self-pay | Admitting: *Deleted

## 2011-09-11 DIAGNOSIS — E876 Hypokalemia: Secondary | ICD-10-CM

## 2011-09-11 MED ORDER — POTASSIUM CHLORIDE ER 10 MEQ PO TBCR: 10.0000 meq | EXTENDED_RELEASE_TABLET | Freq: Every day | ORAL | Status: DC

## 2011-09-11 NOTE — Telephone Encounter (Signed)
Pt aware of results. Rx sent to pharmacy and order placed in Epic.

## 2011-09-11 NOTE — Telephone Encounter (Signed)
Left message for pt to call back  °

## 2011-09-11 NOTE — Telephone Encounter (Signed)
Message copied by Romualdo Bolk on Wed Sep 11, 2011 10:05 AM ------      Message from: [facility], [place] [facility]      Created: Tue Sep 10, 2011  5:31 PM       Advise patient that potassium level has dropped some .  We need to add potassium supplement  20 meq  1 po qd and recheck BMP in   2-3 weeks( nonfasting ok  dont need ov if ok)  Continue other meds .            Potassium 20 meq 1 po qd disp 30 refill x 3

## 2011-10-04 ENCOUNTER — Other Ambulatory Visit (INDEPENDENT_AMBULATORY_CARE_PROVIDER_SITE_OTHER): Payer: BC Managed Care – PPO

## 2011-10-04 DIAGNOSIS — E876 Hypokalemia: Secondary | ICD-10-CM

## 2011-10-04 LAB — BASIC METABOLIC PANEL
BUN: 21 mg/dL (ref 6–23)
CO2: 29 mEq/L (ref 19–32)
Calcium: 9.1 mg/dL (ref 8.4–10.5)
Creatinine, Ser: 1.2 mg/dL (ref 0.4–1.5)
Glucose, Bld: 111 mg/dL — ABNORMAL HIGH (ref 70–99)

## 2011-11-24 ENCOUNTER — Other Ambulatory Visit: Payer: Self-pay | Admitting: Internal Medicine

## 2011-12-02 ENCOUNTER — Ambulatory Visit (INDEPENDENT_AMBULATORY_CARE_PROVIDER_SITE_OTHER): Payer: BC Managed Care – PPO | Admitting: Internal Medicine

## 2011-12-02 ENCOUNTER — Encounter: Payer: Self-pay | Admitting: Internal Medicine

## 2011-12-02 VITALS — BP 130/80 | HR 78 | Wt 218.0 lb

## 2011-12-02 DIAGNOSIS — I10 Essential (primary) hypertension: Secondary | ICD-10-CM

## 2011-12-02 NOTE — Patient Instructions (Signed)
10 # weight loss  May help  Your  BP control.   Continue same meds for now.

## 2011-12-02 NOTE — Progress Notes (Signed)
  Subjective:    Patient ID: Scott Waters, male    DOB: 09-Jul-1956, 56 y.o.   MRN: [id number]  HPI Patient comes in today for follow up of  medical problems.   HT: since last visit we there has been an increase in his  Verapamil . No problem  With this and was 128 range and up some he feels from Stress    At home.   No chest pain shortness of breath edema  Review of Systems A cyst on left knee and back no change  Past history family history social history reviewed in the electronic medical record.     Objective:   Physical Exam Wt Readings from Last 3 Encounters:  12/02/11 218 lb (98.884 kg)  09/09/11 215 lb (97.523 kg)  07/22/11 217 lb (98.431 kg)  WDWN in nad  Bp 130/80 right sitting No clubbing cyanosis or edema Chest:  Clear to A&P without wheezes rales or rhonchi CV:  S1-S2 no gallops or murmurs peripheral perfusion is normal Lipoma question left upper scapula small cyst left laterally skin superficial Last potassium 3.5     Assessment & Plan:  Hypertension  grow could be somewhat better but adequate control today. Reviewed diet sodium cutting out french fries sweet drinks etc. Weight loss of 10 pounds may be quite helpful for control to work on this.  The lipoma left scapular area. Observe recheck as needed  otherwise see BX with labs in 6 months.

## 2012-02-29 ENCOUNTER — Other Ambulatory Visit: Payer: Self-pay | Admitting: Internal Medicine

## 2012-02-29 NOTE — Telephone Encounter (Signed)
Per last office it states medication was discontinued.  Pls advise.

## 2012-03-02 NOTE — Telephone Encounter (Signed)
Call patient  And ask exactly what he is taking    As i dont remember stopping this  i dont see where we stopped it . It is poss the EHR dropped it from the list .  Would make sure he gets his refill x 6 months if  On it and correct the EHR

## 2012-03-06 NOTE — Telephone Encounter (Signed)
Left a message for pt to return call 

## 2012-03-09 NOTE — Telephone Encounter (Signed)
Pt states he has been taking the OTC potassium for about a month or two since his prescription ran out.  Pls advise.

## 2012-03-10 NOTE — Telephone Encounter (Signed)
Refill th e potassium   X 3 months   He is due for cpx with labs in July have him make appt.

## 2012-03-11 MED ORDER — POTASSIUM CHLORIDE CRYS ER 10 MEQ PO TBCR: 10.0000 meq | EXTENDED_RELEASE_TABLET | Freq: Every day | ORAL | Status: DC

## 2012-03-11 NOTE — Telephone Encounter (Signed)
Rx sent to pharmacy.  Left a message for pt to call to make a cpx appt for July.

## 2012-03-11 NOTE — Telephone Encounter (Signed)
Addended by: Azucena Freed on: 03/11/2012 11:30 AM   Modules accepted: Orders

## 2012-05-02 ENCOUNTER — Other Ambulatory Visit: Payer: Self-pay | Admitting: Internal Medicine

## 2012-06-04 ENCOUNTER — Encounter: Payer: Self-pay | Admitting: Internal Medicine

## 2012-06-04 ENCOUNTER — Ambulatory Visit (INDEPENDENT_AMBULATORY_CARE_PROVIDER_SITE_OTHER): Payer: BC Managed Care – PPO | Admitting: Internal Medicine

## 2012-06-04 VITALS — BP 178/100 | HR 79 | Temp 99.2°F | Wt 212.0 lb

## 2012-06-04 DIAGNOSIS — W57XXXA Bitten or stung by nonvenomous insect and other nonvenomous arthropods, initial encounter: Secondary | ICD-10-CM

## 2012-06-04 DIAGNOSIS — IMO0001 Reserved for inherently not codable concepts without codable children: Secondary | ICD-10-CM

## 2012-06-04 DIAGNOSIS — T148XXA Other injury of unspecified body region, initial encounter: Secondary | ICD-10-CM

## 2012-06-04 DIAGNOSIS — I10 Essential (primary) hypertension: Secondary | ICD-10-CM

## 2012-06-04 MED ORDER — DOXYCYCLINE HYCLATE 100 MG PO CAPS: 100.0000 mg | ORAL_CAPSULE | Freq: Two times a day (BID) | ORAL | Status: AC

## 2012-06-04 NOTE — Patient Instructions (Addendum)
Taking antibiotic for probable infection.  Contact us if it is not improving after 3-4 days next week or you get fever or other concerns.  Check your blood pressure to make sure it is coming down and controlled.  Second reading today was 154/88.

## 2012-06-04 NOTE — Progress Notes (Signed)
Subjective:    Patient ID: Scott Waters, male    DOB: 1956/08/25, 56 y.o.   MRN: [id number]  HPI Patient comes in today for SDA for  new problem evaluation. He was working in the yard this weekend about 4-5 days ago. pulled a tick off of his left leg  and since then has had a rash around his it is getting bigger mildly itchy somewhat irritated using hydrocortisone and Neosporin on it. He denies any systemic symptoms such as fever chills or cough. He thinks it was a regular tick and not a small deer tick. Moderate discomfort . Blood pressures been okay was a little what rushed getting here thinks his blood pressure might be up because of stress.  Review of Systems Negative for constitutional symptoms cough chest pain shortness of breath swelling joint pains. Other rashes. Rest as per history of present illness Past history family history social history reviewed in the electronic medical record.  Outpatient Encounter Prescriptions as of 06/04/2012  Medication Sig Dispense Refill  . Ascorbic Acid (VITAMIN C) 500 MG tablet Take 500 mg by mouth daily.        . chlorthalidone (HYGROTON) 25 MG tablet take 1 tablet by mouth once daily  30 tablet  2  . Cholecalciferol (VITAMIN D3) 3000 UNITS TABS Take by mouth.        . Cyanocobalamin (VITAMIN B 12) 100 MCG LOZG Take by mouth.        . fexofenadine (ALLEGRA) 180 MG tablet Take 180 mg by mouth daily.        . fish oil-omega-3 fatty acids 1000 MG capsule Take 1 g by mouth daily.        . fluticasone (FLONASE) 50 MCG/ACT nasal spray INSTILL 2 SPRAYS INTO EACH NOSTRIL ONCE DAILY  16 g  3  . glucosamine-chondroitin 500-400 MG tablet Take 1 tablet by mouth 3 (three) times daily.        Marland Kitchen ibuprofen (ADVIL,MOTRIN) 200 MG tablet Take 200 mg by mouth every 6 (six) hours as needed.        Marland Kitchen OVER THE COUNTER MEDICATION Liver aid       . OVER THE COUNTER MEDICATION Text x 180 Testosterone Booster       . potassium chloride (K-DUR) 10 MEQ tablet Take 20 mEq by  mouth daily.        . potassium chloride (KLOR-CON M10) 10 MEQ tablet Take 1 tablet (10 mEq total) by mouth daily.  30 tablet  3  . simvastatin (ZOCOR) 40 MG tablet take 1 tablet by mouth once daily  30 tablet  2  . verapamil (CALAN-SR) 240 MG CR tablet Take 1 tablet (240 mg total) by mouth at bedtime.  30 tablet  5  . vitamin A 40981 UNIT capsule Take 10,000 Units by mouth daily.        . vitamin E 100 UNIT capsule Take 100 Units by mouth daily.        Marland Kitchen DISCONTD: chlorthalidone (HYGROTON) 25 MG tablet take 1 tablet by mouth once daily  30 tablet  0       Objective:   Physical Exam BP 178/100  Pulse 79  Temp 99.2 F (37.3 C) (Oral)  Wt 212 lb (96.163 kg)  SpO2 99%  Repeat blood pressure was 150s 8/88 range Well-developed well-nourished in no acute distress looks well. Right lower extremity shows a right central with irregular beefy redness about 5 cm round with some blotchiness interrupted there is no induration  fluctuance discharge or vesicles. There is no ulcer. No joint pain or edema Cor rr      Assessment & Plan:  Tick bite  either large local reaction or secondary infection not typical of tick fever or DM but is getting worse after 4-5 days. We'll add antibiotic doxycycline 100 mg 1 by mouth twice a day for 7 days.  Expectant management contact us with fever or lack of resolution or as directed.  Ht up today.  Second reading coming down monitor at home to make sure it's in the normal range. Contact us if it is not controlled.

## 2012-06-21 ENCOUNTER — Other Ambulatory Visit: Payer: Self-pay | Admitting: Internal Medicine

## 2012-06-23 NOTE — Telephone Encounter (Signed)
Pt needs CPX.  Left message on voicemail for him to return my call.

## 2012-07-08 ENCOUNTER — Other Ambulatory Visit: Payer: Self-pay | Admitting: Internal Medicine

## 2012-09-07 ENCOUNTER — Other Ambulatory Visit: Payer: Self-pay | Admitting: Internal Medicine

## 2012-09-21 ENCOUNTER — Other Ambulatory Visit: Payer: Self-pay | Admitting: Internal Medicine

## 2012-10-01 ENCOUNTER — Ambulatory Visit (INDEPENDENT_AMBULATORY_CARE_PROVIDER_SITE_OTHER): Payer: BC Managed Care – PPO | Admitting: Internal Medicine

## 2012-10-01 ENCOUNTER — Encounter: Payer: Self-pay | Admitting: Internal Medicine

## 2012-10-01 VITALS — BP 154/92 | HR 89 | Temp 98.7°F | Wt 218.0 lb

## 2012-10-01 DIAGNOSIS — N411 Chronic prostatitis: Secondary | ICD-10-CM

## 2012-10-01 DIAGNOSIS — Z125 Encounter for screening for malignant neoplasm of prostate: Secondary | ICD-10-CM

## 2012-10-01 DIAGNOSIS — E785 Hyperlipidemia, unspecified: Secondary | ICD-10-CM

## 2012-10-01 DIAGNOSIS — D72819 Decreased white blood cell count, unspecified: Secondary | ICD-10-CM

## 2012-10-01 DIAGNOSIS — I10 Essential (primary) hypertension: Secondary | ICD-10-CM

## 2012-10-01 LAB — LIPID PANEL
Cholesterol: 129 mg/dL (ref 0–200)
HDL: 33 mg/dL — ABNORMAL LOW (ref >39.00)
LDL Cholesterol: 59 mg/dL (ref 0–99)
Triglycerides: 186 mg/dL — ABNORMAL HIGH (ref 0.0–149.0)
VLDL: 37.2 mg/dL (ref 0.0–40.0)

## 2012-10-01 LAB — CBC WITH DIFFERENTIAL/PLATELET
Basophils Absolute: 0 10*3/uL (ref 0.0–0.1)
Eosinophils Absolute: 0.2 10*3/uL (ref 0.0–0.7)
HCT: 40.1 % (ref 39.0–52.0)
Lymphs Abs: 1.1 10*3/uL (ref 0.7–4.0)
MCHC: 32.5 g/dL (ref 30.0–36.0)
MCV: 91.2 fl (ref 78.0–100.0)
Monocytes Absolute: 0.6 10*3/uL (ref 0.1–1.0)
Neutrophils Relative %: 57.3 % (ref 43.0–77.0)
Platelets: 339 10*3/uL (ref 150.0–400.0)
RDW: 14.4 % (ref 11.5–14.6)

## 2012-10-01 LAB — HEPATIC FUNCTION PANEL
ALT: 56 U/L — ABNORMAL HIGH (ref 0–53)
Bilirubin, Direct: 0.1 mg/dL (ref 0.0–0.3)
Total Bilirubin: 0.6 mg/dL (ref 0.3–1.2)

## 2012-10-01 LAB — BASIC METABOLIC PANEL
BUN: 17 mg/dL (ref 6–23)
Chloride: 103 mEq/L (ref 96–112)
Creatinine, Ser: 1.2 mg/dL (ref 0.4–1.5)
Glucose, Bld: 110 mg/dL — ABNORMAL HIGH (ref 70–99)
Potassium: 3.7 mEq/L (ref 3.5–5.1)

## 2012-10-01 MED ORDER — AMLODIPINE-OLMESARTAN 5-20 MG PO TABS: 1.0000 | ORAL_TABLET | Freq: Every day | ORAL | Status: DC

## 2012-10-01 NOTE — Patient Instructions (Addendum)
Your blood pressure is still not in control. Limit alcohol to 14 beverages a week to avoid it affecting your blood pressure. Switch ibuprofen to naproxen or Aleve-type products to reduce cardiovascular risk. These medicines can still interfere with blood pressure medications. Would stop the extra vitamins and take a regular multivitamin for men should have the majority of the ingredients  require included. Decrease processed foods with extra salt and fats. Continued to get good sleep.  Stop the verapamil potassium and chlorthalidone and change to the new medication one pill a day. Decrease the simvastatin to 20 mg per day or take 1/2 tablet to avoid drug interactions.  We will notify you of lab reports when we get that  Followup visit for your blood pressure evaluation in 4-6 weeks earlier if needed contact us if you are unable to follow through with the above plan.

## 2012-10-01 NOTE — Progress Notes (Signed)
Chief Complaint  Patient presents with  . Follow-up    HPI: Patient comes in today for follow up of  multiple medical problems.  BP has been taking meds  No se of these not checking bp readings . Fasting today and didn't take med today.  Taking a lot of Supplements  For presumption of positive health effects .  MS : for various aches and pain takes 2 ibupr in am  LIPIDS taking med no se per pt .  2-3 etoh per day.  Sleep ok   ROS: See pertinent positives and negatives per HPI. No cp sob syncope rash swelling cramps.   Past Medical History  Diagnosis Date  . Seasonal allergies   . Hypertension   . Hyperlipidemia   . Hx of colonic polyps 2007    Family History  Problem Relation Age of Onset  . Colon cancer Father 10    Died at 35  . Hypertension Mother   . Diabetes Mother   . Kidney disease Mother     diailysis from dm   . Stroke      History   Social History  . Marital Status: Married    Spouse Name: N/A    Number of Children: N/A  . Years of Education: N/A   Social History Main Topics  . Smoking status: Former Smoker    Types: Cigarettes  . Smokeless tobacco: None  . Alcohol Use: 7.0 oz/week    14 drink(s) per week  . Drug Use: No  . Sexually Active: None   Other Topics Concern  . 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  , Disp: , Rfl: ;  Cholecalciferol (VITAMIN D3) 3000 UNITS TABS, Take by mouth.  , Disp: , Rfl:  Cyanocobalamin (VITAMIN B 12) 100 MCG LOZG, Take by mouth.  , Disp: , Rfl: ;  fexofenadine (ALLEGRA) 180 MG tablet, Take 180 mg by mouth  daily.  , Disp: , Rfl: ;  fish oil-omega-3 fatty acids 1000 MG capsule, Take 1 g by mouth daily.  , Disp: , Rfl: ;  fluticasone (FLONASE) 50 MCG/ACT nasal spray, INSTILL 2 SPRAYS INTO EACH NOSTRIL ONCE DAILY, Disp: 16 g, Rfl: 3 glucosamine-chondroitin 500-400 MG tablet, Take 1 tablet by mouth 3 (three) times daily.  , Disp: , Rfl: ;  ibuprofen (ADVIL,MOTRIN) 200 MG tablet, Take 200 mg by mouth every 6 (six) hours as needed.  , Disp: , Rfl: ;  OVER THE COUNTER MEDICATION, Liver aid , Disp: , Rfl: ;  potassium chloride (K-DUR) 10 MEQ tablet, Take 20 mEq by mouth daily.  , Disp: , Rfl:  simvastatin (ZOCOR) 40 MG tablet, Take 0.5 tablets (20 mg total) by mouth at bedtime., Disp: 30 tablet, Rfl: 0;  vitamin A 96045 UNIT capsule, Take 10,000 Units by mouth daily.  , Disp: , Rfl: ;  vitamin E 100 UNIT capsule, Take 100 Units by mouth daily.  , Disp: , Rfl: ;  [DISCONTINUED] simvastatin (ZOCOR) 40 MG tablet, take 1 tablet by mouth once daily, Disp:  30 tablet, Rfl: 0  EXAM: BP 154/92  Pulse 89  Temp 98.7 F (37.1 C) (Oral)  Wt 218 lb (98.884 kg)  SpO2 97%  GENERAL: vitals reviewed and listed above, alert, oriented, appears well hydrated and in no acute distress  HEENT: atraumatic, conjunttiva clear, no obvious abnormalities on inspection of external nose and ears    NECK: no obvious masses on inspection palpation   LUNGS: clear to auscultation bilaterally, no wheezes, rales or rhonchi, good air movement  CV: HRRR, no clubbing cyanosis or  peripheral edema nl cap refill   MS: moves all extremities without noticeable focal  abnormality  PSYCH: pleasant and cooperative, no obvious depression or anxiety   ASSESSMENT AND PLAN:  Discussed the following assessment and plan:  1. HYPERLIPIDEMIA  CINNAMON PO, GINSENG PO, amLODipine-olmesartan (AZOR) 5-20 MG per tablet, Basic metabolic panel, CBC with Differential, Hepatic function panel, Lipid panel, TSH  2. LEUKOPENIA, MILD  CINNAMON PO, GINSENG PO,  amLODipine-olmesartan (AZOR) 5-20 MG per tablet, Basic metabolic panel, CBC with Differential, Hepatic function panel, Lipid panel, TSH  3. HYPERTENSION  CINNAMON PO, GINSENG PO, amLODipine-olmesartan (AZOR) 5-20 MG per tablet, Basic metabolic panel, CBC with Differential, Hepatic function panel, Lipid panel, TSH  4. Requests screen PSA   Labs monitoring today  Counseled. Total visit > 50% spent counseling and coordinating care   -Patient advised to return or notify us immediately ifnew concerns arise. Change to aleve type products  Instead of Ibuprofen .  No need for all of these supplements . lsi better  Reduce etoh use can contribute to problems. Patient Instructions  Your blood pressure is still not in control. Limit alcohol to 14 beverages a week to avoid it affecting your blood pressure. Switch ibuprofen to naproxen or Aleve-type products to reduce cardiovascular risk. These medicines can still interfere with blood pressure medications. Would stop the extra vitamins and take a regular multivitamin for men should have the majority of the ingredients  require included. Decrease processed foods with extra salt and fats. Continued to get good sleep.  Stop the verapamil potassium and chlorthalidone and change to the new medication one pill a day. Decrease the simvastatin to 20 mg per day or take 1/2 tablet to avoid drug interactions.  We will notify you of lab reports when we get that  Followup visit for your blood pressure evaluation in 4-6 weeks earlier if needed contact us if you are unable to follow through with the above plan.     Lorretta Harp

## 2012-10-29 ENCOUNTER — Encounter: Payer: Self-pay | Admitting: Internal Medicine

## 2012-10-29 ENCOUNTER — Ambulatory Visit (INDEPENDENT_AMBULATORY_CARE_PROVIDER_SITE_OTHER): Payer: BC Managed Care – PPO | Admitting: Internal Medicine

## 2012-10-29 VITALS — BP 130/80 | HR 76 | Temp 99.0°F | Wt 222.0 lb

## 2012-10-29 DIAGNOSIS — R945 Abnormal results of liver function studies: Secondary | ICD-10-CM

## 2012-10-29 DIAGNOSIS — R7989 Other specified abnormal findings of blood chemistry: Secondary | ICD-10-CM

## 2012-10-29 DIAGNOSIS — E785 Hyperlipidemia, unspecified: Secondary | ICD-10-CM

## 2012-10-29 DIAGNOSIS — I10 Essential (primary) hypertension: Secondary | ICD-10-CM

## 2012-10-29 NOTE — Patient Instructions (Signed)
Continue same medication    ROV  in another 4-5 weeks  Bring in monitor for blood pressure that you have  Limit etoh to 2 per day max .

## 2012-10-29 NOTE — Progress Notes (Signed)
Chief Complaint  Patient presents with  . Follow-up  . Hypertension    HPI: Pt comes in after change in blood pressure meds . Now on Azore and off diuretic and potassium  No se of meds samples.. Has also dec simva to 1/2 tab  .  No change.  Has dec etoh to 2-3 per night . No new sx  bp readings at homw seem to be 140 range . ROS: See pertinent positives and negatives per HPI.  Past Medical History  Diagnosis Date  . Seasonal allergies   . Hypertension   . Hyperlipidemia   . Hx of colonic polyps 2007    Family History  Problem Relation Age of Onset  . Colon cancer Father 77    Died at 49  . Hypertension Mother   . Diabetes Mother   . Kidney disease Mother     diailysis from dm   . Stroke      History   Social History  . Marital Status: Married    Spouse Name: N/A    Number of Children: N/A  . Years of Education: N/A   Social History Main Topics  . Smoking status: Former Smoker    Types: Cigarettes  . Smokeless tobacco: None  . Alcohol Use: 7.0 oz/week    14 drink(s) per week  . Drug Use: No  . Sexually Active: None   Other Topics Concern  . 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 BP on left arm large cuff 136/80 GENERAL: vitals reviewed and listed above, alert, oriented, appears well hydrated and in no acute distress  HEENT: atraumatic, conjunctiva  clear, no obvious abnormalities on inspection of external nose and ears NECK: no obvious masses on inspection palpation   LUNGS: clear to auscultation bilaterally, no wheezes, rales or rhonchi, good air movement  CV: HRRR, no clubbing cyanosis or  peripheral edema nl cap refill   MS: moves all extremities without noticeable focal  Abnormality no sig edema    PSYCH: pleasant and cooperative, no obvious depression or anxiety Lab Results  Component Value Date   WBC 4.6 10/01/2012   HGB 13.0 10/01/2012   HCT 40.1 10/01/2012   PLT 339.0 10/01/2012   GLUCOSE 110* 10/01/2012   CHOL 129 10/01/2012   TRIG 186.0* 10/01/2012   HDL 33.00* 10/01/2012   LDLDIRECT 73.0 04/13/2010   LDLCALC 59 10/01/2012   ALT 56* 10/01/2012   AST 46* 10/01/2012   NA 140 10/01/2012   K 3.7 10/01/2012   CL 103 10/01/2012   CREATININE 1.2 10/01/2012   BUN 17 10/01/2012   CO2 29 10/01/2012   TSH 1.24 10/01/2012   PSA 1.60 10/01/2012    ASSESSMENT AND PLAN:  Discussed the following assessment and plan:  1. HYPERTENSION    some better with my readings and less etoh close fu in 1 months plan labs then   2. Abnormal LFTs    will recheck at next visit inanother month  3. HYPERLIPIDEMIA    plan recheck on dec dose  of simva at next blood draw   -Patient advised to return or notify health care team  immediately if symptoms  worsen or persist or new concerns arise.  Patient Instructions  Continue same medication    ROV  in another 4-5 weeks  Bring in monitor for blood pressure that you have  Limit etoh to 2 per day max .    Lipid lfts bmp at next visit or as appropriate.  He is not on potassium but the med list prints out as active med . Cant fix it.right now. Neta Mends. Mionna Advincula M.D.

## 2012-11-25 HISTORY — PX: MULTIPLE TOOTH EXTRACTIONS: SHX2053

## 2012-11-30 ENCOUNTER — Encounter: Payer: Self-pay | Admitting: Internal Medicine

## 2012-11-30 ENCOUNTER — Ambulatory Visit (INDEPENDENT_AMBULATORY_CARE_PROVIDER_SITE_OTHER): Payer: BC Managed Care – PPO | Admitting: Internal Medicine

## 2012-11-30 VITALS — BP 148/80 | HR 74 | Temp 98.6°F | Wt 220.0 lb

## 2012-11-30 DIAGNOSIS — E785 Hyperlipidemia, unspecified: Secondary | ICD-10-CM

## 2012-11-30 DIAGNOSIS — R7989 Other specified abnormal findings of blood chemistry: Secondary | ICD-10-CM

## 2012-11-30 DIAGNOSIS — I10 Essential (primary) hypertension: Secondary | ICD-10-CM

## 2012-11-30 DIAGNOSIS — R945 Abnormal results of liver function studies: Secondary | ICD-10-CM

## 2012-11-30 LAB — BASIC METABOLIC PANEL
BUN: 16 mg/dL (ref 6–23)
Chloride: 103 mEq/L (ref 96–112)
GFR: 88.18 mL/min (ref >60.00)
Potassium: 3.5 mEq/L (ref 3.5–5.1)
Sodium: 140 mEq/L (ref 135–145)

## 2012-11-30 LAB — LIPID PANEL
Cholesterol: 157 mg/dL (ref 0–200)
LDL Cholesterol: 86 mg/dL (ref 0–99)
Total CHOL/HDL Ratio: 4
VLDL: 31.8 mg/dL (ref 0.0–40.0)

## 2012-11-30 LAB — HEPATIC FUNCTION PANEL
AST: 34 U/L (ref 0–37)
Bilirubin, Direct: 0.1 mg/dL (ref 0.0–0.3)
Total Bilirubin: 0.8 mg/dL (ref 0.3–1.2)

## 2012-11-30 MED ORDER — AMLODIPINE-OLMESARTAN 5-40 MG PO TABS: 1.0000 | ORAL_TABLET | Freq: Every day | ORAL | Status: DC

## 2012-11-30 NOTE — Progress Notes (Signed)
Chief Complaint  Patient presents with  . Follow-up    HPI: FU HT management bp readings once a week   etoh 2-3 per day.  Feeling ok.  bp readings in 150 range  No se of med urinary frequency/ no  New sx  .  ROS: See pertinent positives and negatives per HPI.  Past Medical History  Diagnosis Date  . Seasonal allergies   . Hypertension   . Hyperlipidemia   . Hx of colonic polyps 2007    Family History  Problem Relation Age of Onset  . Colon cancer Father 71    Died at 45  . Hypertension Mother   . Diabetes Mother   . Kidney disease Mother     diailysis from dm   . Stroke      History   Social History  . Marital Status: Married    Spouse Name: N/A    Number of Children: N/A  . Years of Education: N/A   Social History Main Topics  . Smoking status: Former Smoker    Types: Cigarettes  . Smokeless tobacco: None  . Alcohol Use: 7.0 oz/week    14 drink(s) per week  . Drug Use: No  . Sexually Active: None   Other Topics Concern  . 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 amLODipine-olmesartan (AZOR) 5-20 MG per tablet Take 1 tablet by mouth daily.  30 tablet  1  . Ascorbic Acid (VITAMIN C) 500 MG tablet Take 500 mg by mouth daily.        Marland Kitchen CINNAMON PO Take by mouth.      . fexofenadine (ALLEGRA) 180 MG tablet Take 180 mg by mouth daily.        . fish oil-omega-3 fatty acids 1000 MG capsule Take 1 g by mouth daily.        . fluticasone (FLONASE) 50 MCG/ACT nasal spray INSTILL 2 SPRAYS INTO EACH NOSTRIL ONCE DAILY  16 g  3  . GINSENG PO Take by mouth.      Marland Kitchen glucosamine-chondroitin 500-400 MG tablet Take 1 tablet by mouth 3 (three) times daily.        Marland Kitchen ibuprofen (ADVIL,MOTRIN) 200 MG tablet Take 200 mg by mouth every 6 (six) hours as needed.        Marland Kitchen OVER THE COUNTER  MEDICATION Liver aid       . simvastatin (ZOCOR) 40 MG tablet Take 0.5 tablets (20 mg total) by mouth at bedtime.  30 tablet  0  . vitamin A 16109 UNIT capsule Take 10,000 Units by mouth daily.        . vitamin E 100 UNIT capsule Take 100 Units by mouth daily.        . potassium chloride (K-DUR) 10 MEQ tablet Take 20 mEq by mouth daily.        . [DISCONTINUED] Cholecalciferol (VITAMIN D3) 3000 UNITS TABS Take by mouth.        . [DISCONTINUED] Cyanocobalamin (VITAMIN B 12) 100 MCG LOZG Take by mouth.          EXAM:  BP 148/80  Pulse 74  Temp 98.6 F (37 C) (Oral)  Wt 220 lb (99.791 kg)  SpO2 96%  There is no height on file to calculate BMI.  GENERAL: vitals reviewed and listed above, alert, oriented, appears well hydrated and in no acute distress  HEENT: grossly normal  NECK: no obvious masses on inspection palpation     CV: HRRR, no clubbing cyanosis or  peripheral edema nl cap refill   MS: moves all extremities without noticeable focal  abnormality  PSYCH: pleasant and cooperative, no obvious depression or anxiety  ASSESSMENT AND PLAN:  Discussed the following assessment and plan:  1. HYPERTENSION  Basic metabolic panel, Hepatic function panel, Lipid panel   back up increase to 40/5  fo rnow although may do better with inc ccb and restart diuretic tyring to minimize pill count. consider carvediloladd on also   2. HYPERLIPIDEMIA  Basic metabolic panel, Hepatic function panel, Lipid panel   check on dec dose of med  3. Abnormal LFTs  Basic metabolic panel, Hepatic function panel, Lipid panel    -Patient advised to return or notify health care team  immediately if symptoms worsen or persist or new concerns arise.  Patient Instructions  Increase to new samples azor 40 / 5      There  Is a higher dose of 40 10 also that is avaialbe .   rov in another 6 weeks   May need to add diuretic back into mix or other med  .  Will notify you  of labs when  available.    Declined flu vaccine today   Burna Mortimer K. Mikiah Durall M.D.

## 2012-11-30 NOTE — Addendum Note (Signed)
Addended byMadelin Headings on: 11/30/2012 09:42 AM   Modules accepted: Orders

## 2012-11-30 NOTE — Patient Instructions (Signed)
Increase to new samples azor 40 / 5      There  Is a higher dose of 40 10 also that is avaialbe .   rov in another 6 weeks   May need to add diuretic back into mix or other med  .  Will notify you  of labs when available.

## 2012-12-02 NOTE — Progress Notes (Signed)
Quick Note:  Left a message for pt to return call. ______ 

## 2012-12-04 ENCOUNTER — Other Ambulatory Visit: Payer: Self-pay | Admitting: Internal Medicine

## 2013-01-11 ENCOUNTER — Ambulatory Visit (INDEPENDENT_AMBULATORY_CARE_PROVIDER_SITE_OTHER): Payer: BC Managed Care – PPO | Admitting: Internal Medicine

## 2013-01-11 ENCOUNTER — Encounter: Payer: Self-pay | Admitting: Internal Medicine

## 2013-01-11 VITALS — BP 158/98 | HR 90 | Temp 98.8°F | Wt 222.0 lb

## 2013-01-11 DIAGNOSIS — E669 Obesity, unspecified: Secondary | ICD-10-CM

## 2013-01-11 DIAGNOSIS — R945 Abnormal results of liver function studies: Secondary | ICD-10-CM

## 2013-01-11 DIAGNOSIS — I10 Essential (primary) hypertension: Secondary | ICD-10-CM

## 2013-01-11 DIAGNOSIS — R7989 Other specified abnormal findings of blood chemistry: Secondary | ICD-10-CM

## 2013-01-11 MED ORDER — HYDROCHLOROTHIAZIDE 25 MG PO TABS: 25.0000 mg | ORAL_TABLET | Freq: Every day | ORAL | Status: DC

## 2013-01-11 MED ORDER — AMLODIPINE-OLMESARTAN 10-40 MG PO TABS: 1.0000 | ORAL_TABLET | Freq: Every day | ORAL | Status: DC

## 2013-01-11 NOTE — Assessment & Plan Note (Signed)
Change ot azore 40 10 and add hctz    Check 4-6 weeks

## 2013-01-11 NOTE — Progress Notes (Signed)
Chief Complaint  Patient presents with  . Follow-up    Patient is not sure whether he should be taking potassium.    HPI: Fu bp and  lfts  Since last visit    Since last visit no change no cp sob . Not losing weight etoh some less then sometimes 3 -4 drinks per night . Taking sample azore 5 40 no se feels fine. bp readings a t home are in the 160 range!.  taking 20 simva no change not taking potassium ROS: See pertinent positives and negatives per HPI.  Past Medical History  Diagnosis Date  . Seasonal allergies   . Hypertension   . Hyperlipidemia   . Hx of colonic polyps 2007  . Gout 04/27/2011    One episode of joint pain at [facility]   Presumed    . Abnormal LFTs 04/27/2011    Family History  Problem Relation Age of Onset  . Colon cancer Father 27    Died at 53  . Hypertension Mother   . Diabetes Mother   . Kidney disease Mother     diailysis from dm   . Stroke      History   Social History  . Marital Status: Married    Spouse Name: N/A    Number of Children: N/A  . Years of Education: N/A   Social History Main Topics  . Smoking status: Former Smoker    Types: Cigarettes  . Smokeless tobacco: None  . Alcohol Use: 7.0 oz/week    14 drink(s) per week  . Drug Use: No  . Sexually Active: None   Other Topics Concern  . 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 amLODipine-olmesartan (AZOR) 10-40 MG per tablet Take 1 tablet by mouth daily.  42 tablet  0  . Ascorbic Acid (VITAMIN C) 500 MG tablet Take 500 mg by mouth daily.        Marland Kitchen CINNAMON PO Take by mouth.      . fexofenadine (ALLEGRA) 180 MG tablet Take 180 mg by mouth daily.        . fish oil-omega-3 fatty acids 1000 MG capsule Take 1 g by mouth daily.        . fluticasone (FLONASE) 50 MCG/ACT nasal  spray INSTILL 2 SPRAYS INTO EACH NOSTRIL ONCE DAILY  16 g  3  . GINSENG PO Take by mouth.      Marland Kitchen glucosamine-chondroitin 500-400 MG tablet Take 1 tablet by mouth 3 (three) times daily.        . hydrochlorothiazide (HYDRODIURIL) 25 MG tablet Take 1 tablet (25 mg total) by mouth daily.  90 tablet  3  . ibuprofen (ADVIL,MOTRIN) 200 MG tablet Take 200 mg by mouth every 6 (six) hours as needed.        Marland Kitchen OVER THE COUNTER MEDICATION Liver aid       . potassium chloride (K-DUR) 10 MEQ tablet Take 20 mEq by mouth daily.        . simvastatin (ZOCOR) 40 MG tablet Take 0.5 tablets (20 mg total) by mouth at bedtime.  30 tablet  0  . vitamin A 96295 UNIT capsule Take 10,000 Units by mouth daily.        Marland Kitchen  vitamin E 100 UNIT capsule Take 100 Units by mouth daily.        . [DISCONTINUED] amLODipine-olmesartan (AZOR) 5-40 MG per tablet Take 1 tablet by mouth daily.  56 tablet  0  . [DISCONTINUED] simvastatin (ZOCOR) 40 MG tablet take 1 tablet by mouth once daily  30 tablet  1   No facility-administered encounter medications on file as of 01/11/2013.    EXAM:  BP 158/98  Pulse 90  Temp(Src) 98.8 F (37.1 C) (Oral)  Wt 222 lb (100.699 kg)  BMI 34.24 kg/m2  SpO2 98%  Body mass index is 34.24 kg/(m^2).  GENERAL: vitals reviewed and listed above, alert, oriented, appears well hydrated and in no acute distress  HEENT: atraumatic, conjunctiva  clear, no obvious abnormalities on inspection of external nose and ears   NECK: no obvious masses on inspection palpation  No jvd   LUNGS: clear to auscultation bilaterally, no wheezes, rales or rhonchi, good air movement  CV: HRRR, no clubbing cyanosis trc   peripheral edema nl cap refill   MS: moves all extremities without noticeable focal  abnormality  PSYCH: pleasant and cooperative, no obvious depression or anxiety   Chemistry      Component Value Date/Time   NA 140 11/30/2012 0859   K 3.5 11/30/2012 0859   CL 103 11/30/2012 0859   CO2 26 11/30/2012 0859    BUN 16 11/30/2012 0859   CREATININE 1.1 11/30/2012 0859      Component Value Date/Time   CALCIUM 9.3 11/30/2012 0859   ALKPHOS 87 11/30/2012 0859   AST 34 11/30/2012 0859   ALT 43 11/30/2012 0859   BILITOT 0.8 11/30/2012 0859     Wt Readings from Last 3 Encounters:  01/11/13 222 lb (100.699 kg)  11/30/12 220 lb (99.791 kg)  10/29/12 222 lb (100.699 kg)    ASSESSMENT AND PLAN:  Discussed the following assessment and plan:  HYPERTENSION - not better  maximize amol dose and ad back diuretic sampels of azore 10 40 and hcts 25 given fu 4-6 wks  Abnormal LFTs - better at last check  Obesity, unspecified - weight loss will help  If not responding to max of 3 meds  consider resistant ?    Consider  Carvedilol  potass sparing. Clonidine  But tring to minimized se  -Patient advised to return or notify health care team  if symptoms worsen or persist or new concerns arise.  Patient Instructions  xchange dosage  Of azore   And add back the extra fuid pill( a different one)     ROV in 1 month at that time will recheck potassium   Level and bp    Neta Mends. Tishara Pizano M.D.

## 2013-01-11 NOTE — Patient Instructions (Addendum)
xchange dosage  Of azore   And add back the extra fuid pill( a different one)     ROV in 1 month at that time will recheck potassium   Level and bp

## 2013-02-08 ENCOUNTER — Encounter: Payer: Self-pay | Admitting: Internal Medicine

## 2013-02-08 ENCOUNTER — Ambulatory Visit (INDEPENDENT_AMBULATORY_CARE_PROVIDER_SITE_OTHER): Payer: BC Managed Care – PPO | Admitting: Internal Medicine

## 2013-02-08 VITALS — BP 154/70 | HR 92 | Temp 99.2°F | Wt 217.0 lb

## 2013-02-08 DIAGNOSIS — I10 Essential (primary) hypertension: Secondary | ICD-10-CM

## 2013-02-08 LAB — MAGNESIUM: Magnesium: 2.1 mg/dL (ref 1.5–2.5)

## 2013-02-08 LAB — BASIC METABOLIC PANEL
BUN: 19 mg/dL (ref 6–23)
Creatinine, Ser: 1.1 mg/dL (ref 0.4–1.5)
GFR: 86.31 mL/min (ref >60.00)
Potassium: 3.5 mEq/L (ref 3.5–5.1)

## 2013-02-08 MED ORDER — AMLODIPINE-OLMESARTAN 10-40 MG PO TABS: 1.0000 | ORAL_TABLET | Freq: Every day | ORAL | Status: DC

## 2013-02-08 NOTE — Patient Instructions (Addendum)
Check bp readings out of office and record ; Sounds like you need to get a different BP monitor cuff.  It is  possible  You  Have a component of   White coat syndrome.  Will notify you  of labs when available. To check potassium.   Levels.    Wellness visit in 2 months   ( no labs  )  BRING YOUR MONITOR WITH YOU WHEN YOU COME>

## 2013-02-08 NOTE — Progress Notes (Signed)
Chief Complaint  Patient presents with  . Follow-up    HPI: Pt here for ht fu.  Since his last visit he has been taking medication every day a sore 1040 and HCTZ except for today.  At health screening    this past week his blood pressure Was 128 range .  hasnt checked at home . The cuff doesn't adhere well to his arm azore 10 /40 and hctz 25  Since last visit  Not on  Potassium; ran out and for a while did otc but prob hasnt been on this for a few months .  No muscle cramps but does get some numbness when he wakes up in the morning on some of his extremities that goes away with position change.  Is down to 2 alcohol beverages a day. Asks about prostate screening.   ROS: See pertinent positives and negatives per HPI.  Past Medical History  Diagnosis Date  . Seasonal allergies   . Hypertension   . Hyperlipidemia   . Hx of colonic polyps 2007  . Gout 04/27/2011    One episode of joint pain at [facility]   Presumed    . Abnormal LFTs 04/27/2011    Family History  Problem Relation Age of Onset  . Colon cancer Father 2    Died at 58  . Hypertension Mother   . Diabetes Mother   . Kidney disease Mother     diailysis from dm   . Stroke      History   Social History  . Marital Status: Married    Spouse Name: N/A    Number of Children: N/A  . Years of Education: N/A   Social History Main Topics  . Smoking status: Former Smoker    Types: Cigarettes  . Smokeless tobacco: None  . Alcohol Use: 7.0 oz/week    14 drink(s) per week  . Drug Use: No  . Sexually Active: None   Other Topics Concern  . 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 amLODipine-olmesartan (AZOR) 10-40 MG per tablet Take 1 tablet by mouth daily.  30 tablet  6  . Ascorbic Acid (VITAMIN C) 500  MG tablet Take 500 mg by mouth daily.        Marland Kitchen CINNAMON PO Take by mouth.      . fexofenadine (ALLEGRA) 180 MG tablet Take 180 mg by mouth daily.        . fish oil-omega-3 fatty acids 1000 MG capsule Take 1 g by mouth daily.        . fluticasone (FLONASE) 50 MCG/ACT nasal spray INSTILL 2 SPRAYS INTO EACH NOSTRIL ONCE DAILY  16 g  3  . GINSENG PO Take by mouth.      Marland Kitchen glucosamine-chondroitin 500-400 MG tablet Take 1 tablet by mouth 3 (three) times daily.        . hydrochlorothiazide (HYDRODIURIL) 25 MG tablet Take 1 tablet (25 mg total) by mouth daily.  90 tablet  3  . ibuprofen (ADVIL,MOTRIN) 200 MG tablet Take 200 mg by mouth every 6 (six) hours as needed.        Marland Kitchen OVER THE COUNTER MEDICATION Liver aid       .  simvastatin (ZOCOR) 40 MG tablet Take 0.5 tablets (20 mg total) by mouth at bedtime.  30 tablet  0  . vitamin A 16109 UNIT capsule Take 10,000 Units by mouth daily.        . vitamin E 100 UNIT capsule Take 100 Units by mouth daily.        . [DISCONTINUED] amLODipine-olmesartan (AZOR) 10-40 MG per tablet Take 1 tablet by mouth daily.  42 tablet  0  . potassium chloride (K-DUR) 10 MEQ tablet Take 20 mEq by mouth daily.         No facility-administered encounter medications on file as of 02/08/2013.    EXAM:  BP 154/70  Pulse 92  Temp(Src) 99.2 F (37.3 C) (Oral)  Wt 217 lb (98.431 kg)  BMI 33.47 kg/m2  SpO2 98%  Body mass index is 33.47 kg/(m^2).  GENERAL: vitals reviewed and listed above, alert, oriented, appears well hydrated and in no acute distress  HEENT: atraumatic, conjunctiva  clear, no obvious abnormalities on inspection of external nose and ears  NECK: no obvious masses on inspection palpation   LUNGS: clear to auscultation bilaterally, no wheezes, rales or rhonchi, good air movement  CV: HRRR, no clubbing cyanosis or  peripheral edema nl cap refill   MS: moves all extremities without noticeable focal  abnormality  PSYCH: pleasant and cooperative, no obvious  depression or anxiety Lab Results  Component Value Date   WBC 4.6 10/01/2012   HGB 13.0 10/01/2012   HCT 40.1 10/01/2012   PLT 339.0 10/01/2012   GLUCOSE 94 11/30/2012   CHOL 157 11/30/2012   TRIG 159.0* 11/30/2012   HDL 39.50 11/30/2012   LDLDIRECT 73.0 04/13/2010   LDLCALC 86 11/30/2012   ALT 43 11/30/2012   AST 34 11/30/2012   NA 140 11/30/2012   K 3.5 11/30/2012   CL 103 11/30/2012   CREATININE 1.1 11/30/2012   BUN 16 11/30/2012   CO2 26 11/30/2012   TSH 1.24 10/01/2012   PSA 1.60 10/01/2012    ASSESSMENT AND PLAN:  Discussed the following assessment and plan:  HYPERTENSION poss WC effect. - Possible white coat effect based on patient information productive bring his machine today important to document out of office readings. - Plan: Basic metabolic panel, Magnesium Samples and rx and coupon.  For now  Check k and decide on  potass supp plan.   Contact us if blood pressure readings are elevated at home. Until he comes back in in 2 months. -Patient advised to return or notify health care team  if symptoms worsen or persist or new concerns arise. Reviewed record for patient PSA was done about 6 months ago was normal uncertain when his last wellness visit was and prostate exam. We'll plan for this at the next check. Patient Instructions  Check bp readings out of office and record ; Sounds like you need to get a different BP monitor cuff.  It is  possible  You  Have a component of   White coat syndrome.  Will notify you  of labs when available. To check potassium.   Levels.    Wellness visit in 2 months   ( no labs  )  Teacher, adult education MONITOR WITH YOU WHEN YOU COME>       Neta Mends. Panosh M.D.  We'll decide on potassium refill depending on lab tests.

## 2013-04-01 ENCOUNTER — Other Ambulatory Visit: Payer: Self-pay | Admitting: Internal Medicine

## 2013-04-12 ENCOUNTER — Ambulatory Visit (INDEPENDENT_AMBULATORY_CARE_PROVIDER_SITE_OTHER): Payer: BC Managed Care – PPO | Admitting: Internal Medicine

## 2013-04-12 VITALS — BP 142/82 | HR 77 | Temp 98.5°F | Ht 67.75 in | Wt 214.0 lb

## 2013-04-12 DIAGNOSIS — I10 Essential (primary) hypertension: Secondary | ICD-10-CM

## 2013-04-12 DIAGNOSIS — Z860100 Personal history of colon polyps, unspecified: Secondary | ICD-10-CM

## 2013-04-12 DIAGNOSIS — R609 Edema, unspecified: Secondary | ICD-10-CM

## 2013-04-12 DIAGNOSIS — E785 Hyperlipidemia, unspecified: Secondary | ICD-10-CM

## 2013-04-12 DIAGNOSIS — Z0000 Encounter for general adult medical examination without abnormal findings: Secondary | ICD-10-CM

## 2013-04-12 NOTE — Progress Notes (Signed)
Chief Complaint  Patient presents with  . Annual Exam    Pt is not sure if he is taking potassium.    HPI: Patient comes in today for Preventive Health Care visit    a bit of swelling right leg at end of day.  No injury   2 ibuprofen .     Feet hurt  Standing all day.   etoh still 6 + many days with son   Taking mht meds no cp sob no se of meds   hcm ? Dur for colon check hx of polyps  Scott Waters   Lipids no se of med reported   Ran out of potassium and thus not taking and uncertain if should take it  ROS:  GEN/ HEENT: No fever, significant weight changes sweats headaches vision problems hearing changes, CV/ PULM; No chest pain shortness of breath cough, syncope,edema  change in exercise tolerance. GI /GU: No adominal pain, vomiting, change in bowel habits. No blood in the stool. No significant GU symptoms. SKIN/HEME: ,no acute skin rashes suspicious lesions or bleeding. No lymphadenopathy, nodules, masses.  NEURO/ PSYCH:  No neurologic signs such as weakness numbness. No depression anxiety. IMM/ Allergy: No unusual infections.  Allergy .   REST of 12 system review negative except as per HPI   Past Medical History  Diagnosis Date  . Seasonal allergies   . Hypertension   . Hyperlipidemia   . Hx of colonic polyps 2007  . Gout 04/27/2011    One episode of joint pain at [facility]   Presumed    . Abnormal LFTs 04/27/2011    Family History  Problem Relation Age of Onset  . Colon cancer Father 5    Died at 5  . Hypertension Mother   . Diabetes Mother   . Kidney disease Mother     diailysis from dm   . Stroke      History   Social History  . Marital Status: Married    Spouse Name: N/A    Number of Children: N/A  . Years of Education: N/A   Social History Main Topics  . Smoking status: Former Smoker    Types: Cigarettes  . Smokeless tobacco: Not on file  . Alcohol Use: 7.0 oz/week    14 drink(s) per week  . Drug Use: No  . Sexually Active: Not on file   Other Topics  Concern  . 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 amLODipine-olmesartan (AZOR) 10-40 MG per tablet Take 1 tablet by mouth daily.  30 tablet  6  . Ascorbic Acid (VITAMIN C) 500 MG tablet Take 500 mg by mouth daily.        Marland Kitchen CINNAMON PO Take by mouth.      . fexofenadine (ALLEGRA) 180 MG tablet Take 180 mg by mouth daily.        . fish oil-omega-3 fatty acids 1000 MG capsule Take 1 g by mouth daily.        . fluticasone (FLONASE) 50 MCG/ACT nasal spray INSTILL 2 SPRAYS INTO EACH NOSTRIL ONCE DAILY  16 g  3  . GINSENG PO Take by mouth.      Marland Kitchen glucosamine-chondroitin 500-400  MG tablet Take 1 tablet by mouth 3 (three) times daily.        . hydrochlorothiazide (HYDRODIURIL) 25 MG tablet Take 1 tablet (25 mg total) by mouth daily.  90 tablet  3  . ibuprofen (ADVIL,MOTRIN) 200 MG tablet Take 200 mg by mouth every 6 (six) hours as needed.        Marland Kitchen OVER THE COUNTER MEDICATION Liver aid       . simvastatin (ZOCOR) 40 MG tablet take 1 tablet by mouth once daily  30 tablet  10  . vitamin A 65784 UNIT capsule Take 10,000 Units by mouth daily.        . vitamin E 100 UNIT capsule Take 100 Units by mouth daily.        . potassium chloride (K-DUR) 10 MEQ tablet Take 20 mEq by mouth daily.        . [DISCONTINUED] simvastatin (ZOCOR) 40 MG tablet Take 0.5 tablets (20 mg total) by mouth at bedtime.  30 tablet  0   No facility-administered encounter medications on file as of 04/12/2013.    EXAM:  BP 142/82  Pulse 77  Temp(Src) 98.5 F (36.9 C) (Oral)  Ht 5' 7.75" (1.721 m)  Wt 214 lb (97.07 kg)  BMI 32.77 kg/m2  SpO2 96%  Body mass index is 32.77 kg/(m^2). 144/72  142/82      Also oat monitor 152/95    Patient  Monitor right  Physical Exam: Vital signs reviewed ONG:EXBM  is a well-developed well-nourished alert cooperative   male who appears  stated age in no acute distress.  HEENT: normocephalic atraumatic , Eyes: PERRL EOM's full, conjunctiva clear, Nares: paten,t no deformity discharge or tenderness., Ears: no deformity EAC's clear TMs with normal landmarks. Mouth: clear OP, no lesions, edema.  Moist mucous membranes. Dentition in adequate repair. NECK: supple without masses, thyromegaly or bruits. CHEST/PULM:  Clear to auscultation and percussion breath sounds equal no wheeze , rales or rhonchi. No chest wall deformities or tenderness. CV: PMI is nondisplaced, S1 S2 no gallops, murmurs, rubs. Peripheral pulses are full without delay.No JVD .  ABDOMEN: Bowel sounds normal nontender  No guard or rebound, no hepato splenomegal no CVA tenderness.  No hernia. Extremtities:  No clubbing cyanosis or trc to 1 + edema  Lower exxt no redness or warmth  No ulcers or skin changes , no acute joint swelling or redness no focal atrophy NEURO:  Oriented x3, cranial nerves 3-12 appear to be intact, no obvious focal weakness,gait within normal limits no abnormal reflexes or asymmetrical SKIN: No acute rashes normal turgor, color, no bruising or petechiae. PSYCH: Oriented, good eye contact, no obvious depression anxiety, cognition and judgment appear normal. LN: no cervical axillary inguinal adenopathy Rectal prostate no nodules 1 +   Lab Results  Component Value Date   WBC 4.6 10/01/2012   HGB 13.0 10/01/2012   HCT 40.1 10/01/2012   PLT 339.0 10/01/2012   GLUCOSE 97 02/08/2013   CHOL 157 11/30/2012   TRIG 159.0* 11/30/2012   HDL 39.50 11/30/2012   LDLDIRECT 73.0 04/13/2010   LDLCALC 86 11/30/2012   ALT 43 11/30/2012   AST 34 11/30/2012   NA 139 02/08/2013   K 3.5 02/08/2013   CL 102 02/08/2013   CREATININE 1.1 02/08/2013   BUN 19 02/08/2013   CO2 29 02/08/2013   TSH 1.24 10/01/2012   PSA 1.60 10/01/2012    ASSESSMENT AND PLAN:  Discussed the following assessment and  plan:  Encounter  for preventive health examination  HYPERTENSION - monitor here reads slightly higher but farily accurate  still not optimal i think etoh contributing consider change if not working   HYPERLIPIDEMIA  COLONIC POLYPS, HX OF  Edema - seems dependent  poss med contirbutor and weight     Consider change to  Chlorthalidone instead of hctz  Patient Care Team: Madelin Headings, MD as PCP - General Hilarie Fredrickson, MD as Attending Physician (Gastroenterology) Patient Instructions  Intensify lifestyle interventions. Call and get your colonoscopy as you are  Late fore this.  Cut down on etoh.  Take    azor and  hctz  At the same time to help  Your blood pressure monitor  Reads  A bit higher than our readings with a larger cuff. But that being said  If  Better  At home then control may be better than in the office .   Labs and  OV in 4 months or as needed.     Neta Mends. Page Lancon M.D.  Health Maintenance  Topic Date Due  . Influenza Vaccine  07/26/2013  . Colonoscopy  12/19/2015  . Tetanus/tdap  03/15/2019   Health Maintenance Review

## 2013-04-12 NOTE — Patient Instructions (Addendum)
Intensify lifestyle interventions. Call and get your colonoscopy as you are  Late fore this.  Cut down on etoh.  Take    azor and  hctz  At the same time to help  Your blood pressure monitor  Reads  A bit higher than our readings with a larger cuff. But that being said  If  Better  At home then control may be better than in the office .   Labs and  OV in 4 months or as needed.

## 2013-04-18 ENCOUNTER — Encounter: Payer: Self-pay | Admitting: Internal Medicine

## 2013-06-25 ENCOUNTER — Other Ambulatory Visit: Payer: Self-pay | Admitting: Internal Medicine

## 2013-08-03 ENCOUNTER — Other Ambulatory Visit: Payer: Self-pay | Admitting: Internal Medicine

## 2013-08-06 ENCOUNTER — Other Ambulatory Visit (INDEPENDENT_AMBULATORY_CARE_PROVIDER_SITE_OTHER): Payer: BC Managed Care – PPO

## 2013-08-06 DIAGNOSIS — I10 Essential (primary) hypertension: Secondary | ICD-10-CM

## 2013-08-06 LAB — BASIC METABOLIC PANEL
BUN: 19 mg/dL (ref 6–23)
Chloride: 107 mEq/L (ref 96–112)
Creatinine, Ser: 1.4 mg/dL (ref 0.4–1.5)
GFR: 69.46 mL/min (ref >60.00)

## 2013-08-13 ENCOUNTER — Ambulatory Visit (INDEPENDENT_AMBULATORY_CARE_PROVIDER_SITE_OTHER): Payer: BC Managed Care – PPO | Admitting: Internal Medicine

## 2013-08-13 ENCOUNTER — Encounter: Payer: Self-pay | Admitting: Internal Medicine

## 2013-08-13 VITALS — BP 130/80 | HR 83 | Temp 98.3°F | Wt 205.0 lb

## 2013-08-13 DIAGNOSIS — E876 Hypokalemia: Secondary | ICD-10-CM

## 2013-08-13 DIAGNOSIS — I10 Essential (primary) hypertension: Secondary | ICD-10-CM

## 2013-08-13 MED ORDER — POTASSIUM CHLORIDE ER 10 MEQ PO TBCR: 20.0000 meq | EXTENDED_RELEASE_TABLET | Freq: Every day | ORAL | Status: DC

## 2013-08-13 NOTE — Patient Instructions (Signed)
bp is better  Add on potassium  As we discussed .  Check bmp  Level in 3-4 weeks and if ok then  Wellness in MAY  2014 .

## 2013-08-13 NOTE — Progress Notes (Signed)
Chief Complaint  Patient presents with  . Follow-up    HPI: Patient comes in today for follow up of  multiple medical problems.  Mostly for his hypertension bp at home seems ok  No se of med. Seems to be in the 12/15/1928 range. Lots of stresses work but is trying to cope with it still drinking some alcohol 1-3. No other change in his health status no cramps chest pain shortness of breath. He is no longer on the potassium. Had lab work done. ROS: See pertinent positives and negatives per HPI.  Past Medical History  Diagnosis Date  . Seasonal allergies   . Hypertension   . Hyperlipidemia   . Hx of colonic polyps 2007  . Gout 04/27/2011    One episode of joint pain at [facility]   Presumed    . Abnormal LFTs 04/27/2011    Family History  Problem Relation Age of Onset  . Colon cancer Father 34    Died at 71  . Hypertension Mother   . Diabetes Mother   . Kidney disease Mother     diailysis from dm   . Stroke      History   Social History  . Marital Status: Married    Spouse Name: N/A    Number of Children: N/A  . Years of Education: N/A   Social History Main Topics  . Smoking status: Former Smoker    Types: Cigarettes  . Smokeless tobacco: None  . Alcohol Use: 7.0 oz/week    14 drink(s) per week  . Drug Use: No  . Sexual Activity: None   Other Topics Concern  . 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 amLODipine-olmesartan (AZOR) 10-40 MG per tablet Take 1 tablet by mouth daily.  30 tablet  6  . CINNAMON PO Take by mouth.      . fexofenadine (ALLEGRA) 180 MG tablet Take 180 mg by mouth daily.        . fish oil-omega-3 fatty acids 1000 MG capsule Take 1 g by mouth daily.        . fluticasone (FLONASE) 50 MCG/ACT nasal spray INSTILL 2 SPRAYS INTO EACH  NOSTRIL ONCE DAILY  16 g  5  . GINSENG PO Take by mouth.      Marland Kitchen glucosamine-chondroitin 500-400 MG tablet Take 1 tablet by mouth 3 (three) times daily.        . hydrochlorothiazide (HYDRODIURIL) 25 MG tablet Take 1 tablet (25 mg total) by mouth daily.  90 tablet  3  . ibuprofen (ADVIL,MOTRIN) 200 MG tablet Take 200 mg by mouth every 6 (six) hours as needed.        . MULTIPLE VITAMIN PO Take by mouth.      Marland Kitchen OVER THE COUNTER MEDICATION Liver aid       . simvastatin (ZOCOR) 40 MG tablet take 1 tablet by mouth once daily  30 tablet  0  . potassium chloride (K-DUR) 10 MEQ tablet Take 2 tablets (20 mEq total) by mouth daily.  60 tablet  3  . [DISCONTINUED] Ascorbic Acid (VITAMIN C) 500 MG tablet Take 500 mg by mouth daily.        . [DISCONTINUED] potassium  chloride (K-DUR) 10 MEQ tablet Take 20 mEq by mouth daily.        . [DISCONTINUED] vitamin A 16109 UNIT capsule Take 10,000 Units by mouth daily.        . [DISCONTINUED] vitamin E 100 UNIT capsule Take 100 Units by mouth daily.         No facility-administered encounter medications on file as of 08/13/2013.    EXAM:  BP 130/80  Pulse 83  Temp(Src) 98.3 F (36.8 C) (Oral)  Wt 205 lb (92.987 kg)  BMI 31.4 kg/m2  SpO2 98%  Body mass index is 31.4 kg/(m^2).  GENERAL: vitals reviewed and listed above, alert, oriented, appears well hydrated and in no acute distress  LUNGS: clear to auscultation bilaterally, no wheezes, rales or rhonchi, good air movement  CV: HRRR, no clubbing cyanosis or  peripheral edema nl cap refill   MS: moves all extremities without noticeable focal  abnormality  PSYCH: pleasant and cooperative, no obvious depression or anxiety.    Chemistry      Component Value Date/Time   NA 141 08/06/2013 1331   K 3.4* 08/06/2013 1331   CL 107 08/06/2013 1331   CO2 29 08/06/2013 1331   BUN 19 08/06/2013 1331   CREATININE 1.4 08/06/2013 1331      Component Value Date/Time   CALCIUM 8.9 08/06/2013 1331   ALKPHOS 87 11/30/2012  0859   AST 34 11/30/2012 0859   ALT 43 11/30/2012 0859   BILITOT 0.8 11/30/2012 0859       ASSESSMENT AND PLAN:  Discussed the following assessment and plan:  HYPERTENSION - Improved on current regimen triple therapy continue  Low blood potassium - borderline prob from meds restart potassium supplement check chemistry 3 weeks Weight loss has helped some -Patient advised to return or notify health care team  if symptoms worsen or persist or new concerns arise.  Patient Instructions  bp is better  Add on potassium  As we discussed .  Check bmp  Level in 3-4 weeks and if ok then  Wellness in MAY  2014 .   Neta Mends. Panosh M.D.  Wt Readings from Last 3 Encounters:  08/13/13 205 lb (92.987 kg)  04/12/13 214 lb (97.07 kg)  02/08/13 217 lb (98.431 kg)

## 2013-09-07 ENCOUNTER — Other Ambulatory Visit (INDEPENDENT_AMBULATORY_CARE_PROVIDER_SITE_OTHER): Payer: BC Managed Care – PPO

## 2013-09-07 DIAGNOSIS — I10 Essential (primary) hypertension: Secondary | ICD-10-CM

## 2013-09-07 LAB — BASIC METABOLIC PANEL
Calcium: 9.4 mg/dL (ref 8.4–10.5)
GFR: 89.83 mL/min (ref >60.00)
Glucose, Bld: 105 mg/dL — ABNORMAL HIGH (ref 70–99)
Potassium: 4 mEq/L (ref 3.5–5.1)
Sodium: 140 mEq/L (ref 135–145)

## 2013-09-09 ENCOUNTER — Encounter: Payer: Self-pay | Admitting: Family Medicine

## 2013-09-12 ENCOUNTER — Other Ambulatory Visit: Payer: Self-pay | Admitting: Internal Medicine

## 2013-12-31 ENCOUNTER — Other Ambulatory Visit: Payer: Self-pay | Admitting: Internal Medicine

## 2014-01-27 ENCOUNTER — Other Ambulatory Visit: Payer: Self-pay | Admitting: Internal Medicine

## 2014-02-09 ENCOUNTER — Other Ambulatory Visit: Payer: Self-pay | Admitting: Internal Medicine

## 2014-04-15 ENCOUNTER — Inpatient Hospital Stay (HOSPITAL_COMMUNITY)
Admission: EM | Admit: 2014-04-15 | Discharge: 2014-04-22 | DRG: 813 | Disposition: A | Discharge status: Home / Self Care | Payer: BC Managed Care – PPO | Attending: Internal Medicine | Admitting: Internal Medicine

## 2014-04-15 ENCOUNTER — Telehealth: Payer: Self-pay

## 2014-04-15 ENCOUNTER — Ambulatory Visit (INDEPENDENT_AMBULATORY_CARE_PROVIDER_SITE_OTHER): Payer: BC Managed Care – PPO | Admitting: Internal Medicine

## 2014-04-15 ENCOUNTER — Ambulatory Visit: Payer: BC Managed Care – PPO

## 2014-04-15 ENCOUNTER — Encounter: Payer: Self-pay | Admitting: Internal Medicine

## 2014-04-15 ENCOUNTER — Encounter (HOSPITAL_COMMUNITY): Payer: Self-pay | Admitting: Internal Medicine

## 2014-04-15 VITALS — BP 138/60 | HR 83 | Temp 98.6°F | Ht 67.5 in | Wt 206.0 lb

## 2014-04-15 DIAGNOSIS — M3119 Other thrombotic microangiopathy: Secondary | ICD-10-CM

## 2014-04-15 DIAGNOSIS — Z79899 Other long term (current) drug therapy: Secondary | ICD-10-CM

## 2014-04-15 DIAGNOSIS — D72819 Decreased white blood cell count, unspecified: Secondary | ICD-10-CM

## 2014-04-15 DIAGNOSIS — D649 Anemia, unspecified: Secondary | ICD-10-CM | POA: Diagnosis present

## 2014-04-15 DIAGNOSIS — I10 Essential (primary) hypertension: Secondary | ICD-10-CM | POA: Diagnosis present

## 2014-04-15 DIAGNOSIS — T148XXA Other injury of unspecified body region, initial encounter: Secondary | ICD-10-CM

## 2014-04-15 DIAGNOSIS — D696 Thrombocytopenia, unspecified: Secondary | ICD-10-CM | POA: Diagnosis present

## 2014-04-15 DIAGNOSIS — R040 Epistaxis: Secondary | ICD-10-CM

## 2014-04-15 DIAGNOSIS — D693 Immune thrombocytopenic purpura: Principal | ICD-10-CM | POA: Diagnosis present

## 2014-04-15 DIAGNOSIS — E785 Hyperlipidemia, unspecified: Secondary | ICD-10-CM

## 2014-04-15 DIAGNOSIS — K921 Melena: Secondary | ICD-10-CM

## 2014-04-15 DIAGNOSIS — M1990 Unspecified osteoarthritis, unspecified site: Secondary | ICD-10-CM

## 2014-04-15 DIAGNOSIS — M311 Thrombotic microangiopathy: Secondary | ICD-10-CM

## 2014-04-15 DIAGNOSIS — Z0000 Encounter for general adult medical examination without abnormal findings: Secondary | ICD-10-CM

## 2014-04-15 DIAGNOSIS — K148 Other diseases of tongue: Secondary | ICD-10-CM

## 2014-04-15 DIAGNOSIS — R609 Edema, unspecified: Secondary | ICD-10-CM

## 2014-04-15 DIAGNOSIS — J309 Allergic rhinitis, unspecified: Secondary | ICD-10-CM

## 2014-04-15 DIAGNOSIS — Z87891 Personal history of nicotine dependence: Secondary | ICD-10-CM

## 2014-04-15 DIAGNOSIS — Z86010 Personal history of colonic polyps: Secondary | ICD-10-CM

## 2014-04-15 DIAGNOSIS — Z5739 Occupational exposure to other air contaminants: Secondary | ICD-10-CM

## 2014-04-15 HISTORY — DX: Thrombocytopenia, unspecified: D69.6

## 2014-04-15 HISTORY — DX: Gastro-esophageal reflux disease without esophagitis: K21.9

## 2014-04-15 HISTORY — DX: Unspecified osteoarthritis, unspecified site: M19.90

## 2014-04-15 LAB — CBC WITH DIFFERENTIAL/PLATELET
BASOS ABS: 0 10*3/uL (ref 0.0–0.1)
Basophils Relative: 0.4 % (ref 0.0–3.0)
Eosinophils Absolute: 0.2 10*3/uL (ref 0.0–0.7)
Eosinophils Relative: 3.7 % (ref 0.0–5.0)
HCT: 32.6 % — ABNORMAL LOW (ref 39.0–52.0)
Hemoglobin: 10.9 g/dL — ABNORMAL LOW (ref 13.0–17.0)
LYMPHS PCT: 15.6 % (ref 12.0–46.0)
Lymphs Abs: 1 10*3/uL (ref 0.7–4.0)
MCHC: 33.4 g/dL (ref 30.0–36.0)
MCV: 89.7 fl (ref 78.0–100.0)
MONOS PCT: 7.3 % (ref 3.0–12.0)
Monocytes Absolute: 0.5 10*3/uL (ref 0.1–1.0)
Neutro Abs: 4.7 10*3/uL (ref 1.4–7.7)
Neutrophils Relative %: 73 % (ref 43.0–77.0)
RBC: 3.63 Mil/uL — ABNORMAL LOW (ref 4.22–5.81)
RDW: 15.9 % — ABNORMAL HIGH (ref 11.5–15.5)
WBC: 6.4 10*3/uL (ref 4.0–10.5)

## 2014-04-15 LAB — PROTIME-INR
INR: 1 ratio (ref 0.8–1.0)
Prothrombin Time: 10.9 s (ref 9.6–13.1)

## 2014-04-15 LAB — LIPID PANEL
CHOL/HDL RATIO: 4
Cholesterol: 144 mg/dL (ref 0–200)
HDL: 39.9 mg/dL (ref >39.00)
LDL CALC: 76 mg/dL (ref 0–99)
Triglycerides: 142 mg/dL (ref 0.0–149.0)
VLDL: 28.4 mg/dL (ref 0.0–40.0)

## 2014-04-15 LAB — CBC
HCT: 31.2 % — ABNORMAL LOW (ref 39.0–52.0)
Hemoglobin: 10.6 g/dL — ABNORMAL LOW (ref 13.0–17.0)
MCH: 30 pg (ref 26.0–34.0)
MCHC: 34 g/dL (ref 30.0–36.0)
MCV: 88.4 fL (ref 78.0–100.0)
Platelets: 15 10*3/uL — CL (ref 150–400)
RBC: 3.53 MIL/uL — ABNORMAL LOW (ref 4.22–5.81)
RDW: 15.5 % (ref 11.5–15.5)
WBC: 7.4 10*3/uL (ref 4.0–10.5)

## 2014-04-15 LAB — COMPREHENSIVE METABOLIC PANEL
ALT: 42 U/L (ref 0–53)
AST: 47 U/L — ABNORMAL HIGH (ref 0–37)
Albumin: 3.6 g/dL (ref 3.5–5.2)
Alkaline Phosphatase: 110 U/L (ref 39–117)
BUN: 21 mg/dL (ref 6–23)
CO2: 22 mEq/L (ref 19–32)
Calcium: 8.9 mg/dL (ref 8.4–10.5)
Chloride: 105 mEq/L (ref 96–112)
Creatinine, Ser: 1.09 mg/dL (ref 0.50–1.35)
GFR calc Af Amer: 83 mL/min — ABNORMAL LOW (ref >90)
GFR calc non Af Amer: 72 mL/min — ABNORMAL LOW (ref >90)
Glucose, Bld: 124 mg/dL — ABNORMAL HIGH (ref 70–99)
Potassium: 3.4 mEq/L — ABNORMAL LOW (ref 3.7–5.3)
Sodium: 141 mEq/L (ref 137–147)
Total Bilirubin: 0.5 mg/dL (ref 0.3–1.2)
Total Protein: 6.8 g/dL (ref 6.0–8.3)

## 2014-04-15 LAB — TSH: TSH: 1.69 u[IU]/mL (ref 0.35–4.50)

## 2014-04-15 LAB — HEPATIC FUNCTION PANEL
ALK PHOS: 97 U/L (ref 39–117)
ALT: 44 U/L (ref 0–53)
AST: 49 U/L — AB (ref 0–37)
Albumin: 3.8 g/dL (ref 3.5–5.2)
BILIRUBIN DIRECT: 0.2 mg/dL (ref 0.0–0.3)
BILIRUBIN TOTAL: 1.1 mg/dL (ref 0.2–1.2)
Total Protein: 7 g/dL (ref 6.0–8.3)

## 2014-04-15 LAB — BASIC METABOLIC PANEL
BUN: 15 mg/dL (ref 6–23)
CALCIUM: 9.2 mg/dL (ref 8.4–10.5)
CO2: 25 mEq/L (ref 19–32)
Chloride: 105 mEq/L (ref 96–112)
Creatinine, Ser: 1 mg/dL (ref 0.4–1.5)
GFR: 103.95 mL/min (ref >60.00)
Glucose, Bld: 105 mg/dL — ABNORMAL HIGH (ref 70–99)
Potassium: 3.5 mEq/L (ref 3.5–5.1)
SODIUM: 139 meq/L (ref 135–145)

## 2014-04-15 LAB — TYPE AND SCREEN
ABO/RH(D): A POS
ANTIBODY SCREEN: NEGATIVE

## 2014-04-15 LAB — LACTATE DEHYDROGENASE: LDH: 535 U/L — ABNORMAL HIGH (ref 94–250)

## 2014-04-15 LAB — SAVE SMEAR

## 2014-04-15 LAB — APTT: aPTT: 27.8 s (ref 21.7–28.8)

## 2014-04-15 LAB — POC OCCULT BLOOD, ED: Fecal Occult Bld: POSITIVE — AB

## 2014-04-15 LAB — TECHNOLOGIST SMEAR REVIEW

## 2014-04-15 LAB — D-DIMER, QUANTITATIVE (NOT AT ARMC): D-Dimer, Quant: 1.41 ug/mL-FEU — ABNORMAL HIGH (ref 0.00–0.48)

## 2014-04-15 MED ORDER — SODIUM CHLORIDE 0.9 % IJ SOLN
3.0000 mL | Freq: Two times a day (BID) | INTRAMUSCULAR | Status: DC
  Administered 2014-04-15 – 2014-04-21 (×10): 3 mL via INTRAVENOUS

## 2014-04-15 MED ORDER — VITAMIN B-1 100 MG PO TABS
100.0000 mg | ORAL_TABLET | Freq: Every day | ORAL | Status: DC
  Administered 2014-04-16 – 2014-04-22 (×8): 100 mg via ORAL
  Filled 2014-04-15 (×8): qty 1

## 2014-04-15 MED ORDER — IMMUNE GLOBULIN (HUMAN) 10 GM/100ML IV SOLN: 1.0000 g/kg | INTRAVENOUS | Status: DC

## 2014-04-15 MED ORDER — FLUTICASONE PROPIONATE 50 MCG/ACT NA SUSP
2.0000 | Freq: Every day | NASAL | Status: DC | PRN
  Filled 2014-04-15: qty 16

## 2014-04-15 MED ORDER — BIOTENE DRY MOUTH MT LIQD
15.0000 mL | Freq: Two times a day (BID) | OROMUCOSAL | Status: DC
  Administered 2014-04-16 – 2014-04-20 (×9): 15 mL via OROMUCOSAL

## 2014-04-15 MED ORDER — FOLIC ACID 1 MG PO TABS
1.0000 mg | ORAL_TABLET | Freq: Every day | ORAL | Status: DC
  Administered 2014-04-16 – 2014-04-19 (×5): 1 mg via ORAL
  Filled 2014-04-15 (×6): qty 1

## 2014-04-15 MED ORDER — AMLODIPINE BESYLATE 5 MG PO TABS
5.0000 mg | ORAL_TABLET | Freq: Every day | ORAL | Status: DC
  Administered 2014-04-15 – 2014-04-22 (×8): 5 mg via ORAL
  Filled 2014-04-15 (×8): qty 1

## 2014-04-15 MED ORDER — IMMUNE GLOBULIN (HUMAN) 10 GM/200ML IV SOLN
1.0000 g/kg | INTRAVENOUS | Status: DC
  Administered 2014-04-15: 90 g via INTRAVENOUS
  Filled 2014-04-15 (×2): qty 1800

## 2014-04-15 MED ORDER — CHLORHEXIDINE GLUCONATE 0.12 % MT SOLN
15.0000 mL | Freq: Two times a day (BID) | OROMUCOSAL | Status: DC
  Administered 2014-04-15 – 2014-04-22 (×14): 15 mL via OROMUCOSAL
  Filled 2014-04-15 (×16): qty 15

## 2014-04-15 MED ORDER — LORAZEPAM 1 MG PO TABS: 1.0000 mg | ORAL_TABLET | Freq: Four times a day (QID) | ORAL | Status: AC | PRN

## 2014-04-15 MED ORDER — ACETAMINOPHEN 325 MG PO TABS: 650.0000 mg | ORAL_TABLET | Freq: Four times a day (QID) | ORAL | Status: DC | PRN

## 2014-04-15 MED ORDER — ACETAMINOPHEN 650 MG RE SUPP: 650.0000 mg | Freq: Four times a day (QID) | RECTAL | Status: DC | PRN

## 2014-04-15 MED ORDER — PANTOPRAZOLE SODIUM 40 MG IV SOLR
40.0000 mg | Freq: Two times a day (BID) | INTRAVENOUS | Status: DC
  Administered 2014-04-15 – 2014-04-16 (×3): 40 mg via INTRAVENOUS
  Filled 2014-04-15 (×5): qty 40

## 2014-04-15 MED ORDER — LORAZEPAM 2 MG/ML IJ SOLN: 1.0000 mg | Freq: Four times a day (QID) | INTRAMUSCULAR | Status: AC | PRN

## 2014-04-15 MED ORDER — DEXAMETHASONE 4 MG PO TABS
40.0000 mg | ORAL_TABLET | Freq: Every day | ORAL | Status: DC
  Administered 2014-04-16 (×2): 40 mg via ORAL
  Filled 2014-04-15 (×3): qty 1

## 2014-04-15 MED ORDER — THIAMINE HCL 100 MG/ML IJ SOLN
100.0000 mg | Freq: Every day | INTRAMUSCULAR | Status: DC
  Filled 2014-04-15 (×6): qty 1

## 2014-04-15 NOTE — ED Notes (Signed)
Doctors are aware of Platelet count

## 2014-04-15 NOTE — Telephone Encounter (Signed)
Spoke to the pt.  Advised that he go directly to [facility] ED per Dr. Regis Bill and Dr. Beryle Beams.  Asked if he had any questions.  No questions at this time.

## 2014-04-15 NOTE — ED Notes (Signed)
Pt came to the ED because his doctor told him that he had abnormal labs. Pt stated that he has been having intermittent nose bleeds, bleeding of gums and dark tarry stools for several weeks to months. No cardiac or respiratory distress.

## 2014-04-15 NOTE — ED Notes (Addendum)
Presents from [facility] over due to abnormal labs. Pt has no complaints. He says he feels fine but the doctor called and told him to go to the ER because he had no "White blood cells and could bleed out" The labs in chart show a slightly lower RBC and Hemoglobin of 10.6- pt reports dark stools for one week. Denies pain, denies dizziness. denies use of blood thinners.

## 2014-04-15 NOTE — Patient Instructions (Addendum)
Continue lifestyle intervention healthy eating and exercise . Advise seeing counselor about anger  Lab today about LE bruising. . Use compressino stockings  15 - 20 when working and legs down .  Use lots of moisturizer for the flaking area s  Saline nose spray everu day for the nose blood and if not getting better  We can have ent see you . Plan to refer to get someone to look at leasion on  Your tongue Get your eye check. We may need to stop the potassium if you are off the diuretic   unbeknowns to me  If you bp is controlled without it we can continue off of this medication . Will let you know after labs.  Plan rov in 3-4 months depending on labs and how  You are doing,

## 2014-04-15 NOTE — H&P (Signed)
[facility] History and Physical  Patient: Scott Waters  XBM:[id number]  DOB: Dec 22, 1955  DOS: the patient was seen and examined on 04/15/2014 PCP: [name], MD  Chief Complaint: Nosebleed  HPI: [name] is a 58 y.o. male with Past medical history of hypertension, dyslipidemia, gout. The patient was sent from his PCPs office. 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 He mentions that since last month he has been having on and off spontaneous nosebleed as well as become bleed. He denies any loose tarry black stool but does mention he has daily hard black bowel movement. He denies any abdominal pain or acid reflux. At present he mentions he does not have any active bleeding. He denies any focal neurological deficit. He denies being on any new medication in last to 3 months. He also denies being on any antibiotics recently. He denies any travel. Denies any rash or any bug bites. He denies any shortness of breath or chest pain but does complain of some dizziness and generalized weakness. He denies any similar episodes in the past. He denies any blood transfusion in recent years. He mentions he is compliant with his medication and does not take any over-the-counter medications. He denies any blood in his urine or bright red blood anywhere else. He mentions almost a year ago he had injured his right ankle and had some swelling there this was followed by spontaneous bruise on his left ankle 6 months ago and has chronic swelling that. He denies any immobilization and mentions he walks 6-8 miles daily on his work.  The patient is coming from home. And at his baseline independent for most of his ADL.  Review of Systems: as mentioned in the history of present illness.  A Comprehensive review of the other systems is negative.  Past Medical History  Diagnosis Date  . Seasonal allergies   . Hypertension   . Hyperlipidemia   . Hx of colonic polyps 2007  . Gout  04/27/2011    One episode of joint pain at [facility]   Presumed    . Abnormal LFTs 04/27/2011  . Osteoarthritis    Past Surgical History  Procedure Laterality Date  . Tonsillectomy    . Polypectomy    . Multiple tooth extractions     Social History:  reports that he has quit smoking. His smoking use included Cigarettes. He smoked 0.00 packs per day. He does not have any smokeless tobacco history on file. He reports that he drinks about 10.5 ounces of alcohol per week. He reports that he does not use illicit drugs.  No Known Allergies  Family History  Problem Relation Age of Onset  . Colon cancer Father 57    Died at 82  . Hypertension Mother   . Diabetes Mother   . Kidney disease Mother     diailysis from dm   . Stroke      Prior to Admission medications   Medication Sig Start Date End Date Taking? Authorizing Provider  amLODipine-olmesartan (AZOR) 10-40 MG per tablet Take 1 tablet by mouth daily.   Yes Historical Provider, MD  CINNAMON PO Take 2 capsules by mouth daily.    Yes Historical Provider, MD  fexofenadine (ALLEGRA) 180 MG tablet Take 180 mg by mouth daily.    Yes Historical Provider, MD  fish oil-omega-3 fatty acids 1000 MG capsule Take 2 g by mouth daily.    Yes Historical Provider, MD  fluticasone (FLONASE) 50 MCG/ACT nasal spray Place 2 sprays  into both nostrils daily as needed for allergies.   Yes Historical Provider, MD  GINSENG PO Take 2 capsules by mouth daily.    Yes Historical Provider, MD  GLUCOSAMINE HCL-MSM PO Take 3 tablets by mouth daily.   Yes Historical Provider, MD  ibuprofen (ADVIL,MOTRIN) 200 MG tablet Take 400 mg by mouth 2 (two) times daily as needed (pain).    Yes Historical Provider, MD  Multiple Vitamin (MULTIVITAMIN WITH MINERALS) TABS tablet Take 1 tablet by mouth daily.   Yes Historical Provider, MD  OVER THE COUNTER MEDICATION Take 2 tablets by mouth daily. Liver aid from [place]   Yes Historical Provider, MD  potassium chloride (K-DUR) 10 MEQ tablet  Take 10 mEq by mouth daily.   Yes Historical Provider, MD  simvastatin (ZOCOR) 40 MG tablet Take 40 mg by mouth daily.   Yes Historical Provider, MD    Physical Exam: Filed Vitals:   04/15/14 1652 04/15/14 2000 04/15/14 2015 04/15/14 2030  BP: 148/83 120/61 133/75 134/82  Pulse: 100 89 86 84  Temp: 98.7 F (37.1 C)     TempSrc: Oral     Resp: 16     Height: 5\' 8"  (1.727 m)     Weight: 92.987 kg (205 lb)     SpO2: 100% 98% 98% 99%    General: Alert, Awake and Oriented to Time, Place and Person. Appear in mild distress Eyes: PERRL ENT: Oral Mucosa clear moist. Neck: No  JVD Cardiovascular: S1 and S2 Present, no  Murmur, Peripheral Pulses Present Respiratory: Bilateral Air entry equal and Decreased, Clear to Auscultation,  No  Crackles,no  wheezes Abdomen: Bowel Sound Present, Soft and Non tender Skin: No  Rash Extremities: Trace  Pedal edema, no  calf tenderness Neurologic: Grossly no focal neuro deficit. Labs on Admission:  CBC:  Recent Labs Lab 04/15/14 0956 04/15/14 1700  WBC 6.4 7.4  NEUTROABS 4.7  --   HGB 10.9* 10.6*  HCT 32.6* 31.2*  MCV 89.7 88.4  PLT <20.0 Repeated and verified X2.* 15*    CMP     Component Value Date/Time   NA 141 04/15/2014 1700   K 3.4* 04/15/2014 1700   CL 105 04/15/2014 1700   CO2 22 04/15/2014 1700   GLUCOSE 124* 04/15/2014 1700   BUN 21 04/15/2014 1700   CREATININE 1.09 04/15/2014 1700   CALCIUM 8.9 04/15/2014 1700   PROT 6.8 04/15/2014 1700   ALBUMIN 3.6 04/15/2014 1700   AST 47* 04/15/2014 1700   ALT 42 04/15/2014 1700   ALKPHOS 110 04/15/2014 1700   BILITOT 0.5 04/15/2014 1700   GFRNONAA 72* 04/15/2014 1700   GFRAA 83* 04/15/2014 1700    No results found for this basename: LIPASE, AMYLASE,  in the last 168 hours No results found for this basename: AMMONIA,  in the last 168 hours  No results found for this basename: CKTOTAL, CKMB, CKMBINDEX, TROPONINI,  in the last 168 hours BNP (last 3 results) No results found for this basename:  PROBNP,  in the last 8760 hours  Radiological Exams on Admission: No results found.   Assessment/Plan Principal Problem:   Thrombocytopenia Active Problems:   HYPERTENSION   Nosebleed   Bruising   1. Thrombocytopenia The patient is presenting with pancytopenia. Hematologic has consulted and has evaluated the patient. 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 There is no significant evidence of hemolysis. Patient is alert he started on Decadron and IVIG. At present we will be trying to avoid any medication that  can affect platelet count. Continue to monitor his platelet counts. Monitor on telemetry at present.  2.Positive Hemoccult. Most likely secondary to gum bleeding as well as nose bleeding. Patient does not appear to have any active GI bleeding as he does not have any significant diarrhea. Would continue to monitor his telemetry, Hemoccult stool as well as CBC. 2 doses of IV Protonix at present GI consult in the morning.  3.Hypertension Continue amlodipine.  Consults: Hematology oncology appreciate consult  DVT Prophylaxis: mechanical compression device Nutrition: N.p.o. except medication  Code Status: Full  Disposition: Admitted to inpatient in telemetry unit.  Author: Berle Mull, MD [facility] Pager: [phone] 04/15/2014, 9:17 PM    If 7PM-7AM, please contact night-coverage [email or web address] Password [email or web address]

## 2014-04-15 NOTE — Consult Note (Signed)
ID: Scott Waters   DOB: 1956-07-29  MR#: [id number]  SPQ#:[id number]  PCP: [name], MD  HISTORY OF PRESENT ILLNESS: Scott Waters is a 58 y.o. male with h/o HTN and HLD who presents with thrombocytopenia.  He was in his usual state of health until about 4 weeks ago when he noticed increased bruising and formation of hematomas on his legs bilaterally. He works at a [employer] and frequently has minor injuries associated with the job. He never had this problem prior to 4 weeks ago. In addition, he notes increasing epistaxis. This started ~1-2 weeks ago and was unprovoked, but self-limited. In the last few days he hasn't had any outright bleeding, but has had blood-streeked sputum. He does have melena for a few weeks.   All of these symptoms are given in retrospect. His actually reason for presentation was for an undetectable PLT count found by his PCP on routine lab testing. He was initially sent to the hematology clinic for consultation, but was instead advised to present directly to the ED.  He currently denies any recent exposure to antibiotics, blood transfusions, IV drugs, or risk factors for HIV or Hepatitis C. He has no f/c, night sweats, weight loss, headache, sob, cough, wheezing, cp, ap, n/v/d/c, dysuria. He has had BLE edema for about a year which is followed by his PCP. In regards to bleeding history, he had a full upper and partial lower dental extraction about a year ago and did not have any significant bleeding. He had a polypectomy about 7 years ago and did not have any significant bleeding after that, either.  REVIEW OF SYSTEMS: 10-system review was negative except as per hpi  PAST MEDICAL HISTORY: Past Medical History  Diagnosis Date  . Seasonal allergies   . Hypertension   . Hyperlipidemia   . Hx of colonic polyps 2007  . Gout 04/27/2011    One episode of joint pain at [facility]   Presumed    . Abnormal LFTs 04/27/2011  . Osteoarthritis     PAST SURGICAL HISTORY: Past  Surgical History  Procedure Laterality Date  . Tonsillectomy    . Polypectomy    . Multiple tooth extractions      FAMILY HISTORY Family History  Problem Relation Age of Onset  . Colon cancer Father 3    Died at 45  . Hypertension Mother   . Diabetes Mother   . Kidney disease Mother     diailysis from dm   . Stroke      SOCIAL HISTORY: He lives at home with his son. He is employed at [employer] and works loading/unloading trucks. He quit tobacco 7 years ago, drinks 3-4 beers per night, and denies any history of IVDU. He was married for >20 years and fully monogamous with his wife.   HEALTH MAINTENANCE: History  Substance Use Topics  . Smoking status: Former Smoker    Types: Cigarettes  . Smokeless tobacco: Not on file  . Alcohol Use: 10.5 oz/week    21 drink(s) per week     Colonoscopy: 6-7 years ago. Polyps, but no cancer  Lipid panel: drawn today, see labs  No Known Allergies  No current facility-administered medications for this encounter.    OBJECTIVE: Filed Vitals:   04/15/14 2030  BP: 134/82  Pulse: 84  Temp:   Resp:      Body mass index is 31.18 kg/(m^2).    ECOG FS:  GEN: well-nourished male in NAD HEENT: op shows prior dental extraction  with dentures in place. There is blood in the oropharynx with adherent clot to the posterior aspect of the lower incisors. There are no bullae or wet purpura. His sclerae are anicteric CV: nrrr. No mrg. PULM: ctab, no wrr GI: soft, ntnd, no rebound/guarding, nabs, no HSM MSK: no spinal tenderness GU: no CVA tenderness LYMPH: no cervical, supra/infraclavicular, or axillary LAD EXT: wwp. No cc. BLE have 1+ pitting edema. There are scattered hematomas from the ankles to 1/2 way up the shins bilaterally. SKIN: petechiae noted in the BLE from the ankles to 1/2 way up the calves. NEURO: A&Ox4. CNII-XII intact. Muscle tone and bulk is normal. Sensation is normal throughout and strength is 5/5 in all 4 extremities.  LAB  RESULTS: Lab Results  Component Value Date   WBC 7.4 04/15/2014   NEUTROABS 4.7 04/15/2014   HGB 10.6* 04/15/2014   HCT 31.2* 04/15/2014   MCV 88.4 04/15/2014   PLT 15* 04/15/2014     Recent Labs Lab 04/15/14 0956  INR 1.0    Urinalysis    Component Value Date/Time   COLORURINE yellow 03/14/2009 0848   APPEARANCEUR Clear 03/14/2009 0848   LABSPEC 1.020 03/14/2009 0848   PHURINE 5.0 03/14/2009 0848   HGBUR negative 03/14/2009 0848   BILIRUBINUR n 04/16/2011   BILIRUBINUR negative 03/14/2009 0848   PROTEINUR n 04/16/2011   UROBILINOGEN 0.2 04/16/2011   UROBILINOGEN 0.2 03/14/2009 0848   NITRITE n 04/16/2011   NITRITE negative 03/14/2009 0848   LEUKOCYTESUR n 04/16/2011    No imaging studies done.  Assessment: Scott Waters is a 58 y.o. male with severe thrombocytopenia that began within the last several weeks based on history. His last normal platelet count in the EPIC system is fro 10/01/12 (339) and he has normal counts going back to 02/2009. His history is significant for oropharyngeal bleeding, epistaxis, and likely melena. He has multiple drug exposures, though none of them are a slam dunk for drug-induced ITP. He is on ibuprofen several times per day, but has been on this for years. He has not had any recent antibiotic or heparin exposure and has never had a blood transfusion. He does drink 3-4 drinks of alcohol per day, which may be having some overall suppressive effect. The physical exam is significant for petechiae, but no hepatosplenomegaly, lymphadenopathy.   The peripheral smear was damaged and not ideal for interpretation, but shows thrombocytopenia without evidence of platelet clumping. There are rare schistocytes, but <1 per HPF and certainly not the predominant finding.  The white cell population is polytypic. Other pertinent labs show Platelets of 15, Hb 10.6, with normal values for WBC, ANC, PT/INR, aPTT, creatinine, and TBili. Fibrinogen, LDH, haptoglobin are all pending. D-dimer  was elevated, but this is nonspecific. There are no sign/symptoms of VTE at this point.  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 Whether or not the ITP is secondary to drug exposure is more difficult to elucidate There's no clear evidence of DIC or thrombotic microangiopathy and he has no risk facotrs for HIT or posttransfusion purpura.  Plan:  - dexamethasone 56m po x 4 days - IVIg 1g/kg daily for 1-2 days depending on platelet response - follow up ldh, haptoglobin, fibrinogen  - would check BU38 Folic acid. Also HIV and HepC (can cause ITP) - Type and screen. Avoid plt transfusion unless there is clinically significant bleeding (more because they'll be ineffective in ITP). No need for  pRBC at this point. Would transfuse for symptoms related to anemia or Hb<8 in the setting of low-grade bleeding - consider consultation by GI for GI bleed/melena (not urgent). Rarely, H. Pylori can cause ITP, so consider evaluating this as well. - no need for bone marrow biopsy at this point. - would d/c ibuprofen and avoid other NSAIDS (he can use APAP for pain) - recommend scaling back on EtOH use - Padua score = 1 (for obesity), so low risk for VTE. He does not meet criteria for requiring DVT prophylaxis, however if prophylaxis is used by the primary team, please use SCD and avoid medications until/unless platelet count is >50.  Dr. [name] will follow up with the patient in the morning.  Scott Waters    04/15/2014

## 2014-04-15 NOTE — ED Notes (Signed)
Completed Occoult of patient. Robin EMT witness. Pt agreed. No issues or occurences.

## 2014-04-15 NOTE — Telephone Encounter (Signed)
Left a message on home/cell for the pt to return my call ASAP to discuss lab work.

## 2014-04-15 NOTE — ED Notes (Signed)
Transported

## 2014-04-15 NOTE — Progress Notes (Signed)
Chief Complaint  Patient presents with  . Annual Exam    HPI: Patient comes in today for Preventive Health Care visit  Number of issues to discuss.  Hypertension taking medication but ran out of the HCTZ couple months ago and he didn't get refilled however he is taking one potassium a day.  Sinuses are bothering him and gets Clots of blood    Nose . No fever    tongue spot off and on. Over the last few months. Due to see his dentist soon no pain  No weight loss does get swelling of his legs and briefly looking areas Above no bruising on torso sometimes on extremities has an active job.  . Taking fish oil supplement / ibuprofen every day for joint pain.  LIFESTYLE:  Exercise:  work Tobacco/ETS:no Alcohol: per day  2-+  Sugar beverages: Sleep:pk  Drug use: no  Colonoscopy:    Health Maintenance  Topic Date Due  . Zostavax  02/20/2014  . Influenza Vaccine  06/25/2014  . Colonoscopy  12/19/2015  . Tetanus/tdap  03/15/2019   Health Maintenance Review ROS:  GEN/ HEENT: No fever, significant weight changes sweats headaches vision problems hearing changes, CV/ PULM; No chest pain shortness of breath cough, syncope,edema  change in exercise tolerance. GI /GU: No adominal pain, vomiting, change in bowel habits. No blood in the stool. No significant GU symptoms. SKIN/HEME: ,no acute skin rashes are on tongue off and on for months  . No lymphadenopathy, nodules, masses.  Some bruising spots on le .  NEURO/ PSYCH:  No neurologic signs such as weakness numbness. No depression anxiety. Does get mad about things happening in his life IMM/ Allergy: No unusual infections.  Allergy .   REST of 12 system review negative except as per HPI   Past Medical History  Diagnosis Date  . Seasonal allergies   . Hypertension   . Hyperlipidemia   . Hx of colonic polyps 2007  . Gout 04/27/2011    One episode of joint pain at [facility]   Presumed    . Abnormal LFTs 04/27/2011    Family History    Problem Relation Age of Onset  . Colon cancer Father 60    Died at 15  . Hypertension Mother   . Diabetes Mother   . Kidney disease Mother     diailysis from dm   . Stroke      History   Social History  . Marital Status: Married    Spouse Name: N/A    Number of Children: N/A  . Years of Education: N/A   Social History Main Topics  . Smoking status: Former Smoker    Types: Cigarettes  . Smokeless tobacco: None  . Alcohol Use: 7.0 oz/week    14 drink(s) per week  . Drug Use: No  . Sexual Activity: None   Other Topics Concern  . 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 AZOR 10-40 MG per tablet take 1 tablet by mouth once daily  . CINNAMON PO Take by mouth.  . fexofenadine (ALLEGRA) 180 MG tablet Take 180 mg by mouth daily.    . fish oil-omega-3 fatty acids 1000 MG capsule  Take 1 g by mouth daily.    Marland Kitchen GINSENG PO Take by mouth.  Marland Kitchen glucosamine-chondroitin 500-400 MG tablet Take 1 tablet by mouth 3 (three) times daily.    Marland Kitchen ibuprofen (ADVIL,MOTRIN) 200 MG tablet Take 200 mg by mouth every 6 (six) hours as needed.    . MULTIPLE VITAMIN PO Take by mouth.  Marland Kitchen OVER THE COUNTER MEDICATION Liver aid   . potassium chloride (K-DUR,KLOR-CON) 10 MEQ tablet take 2 tablets by mouth once daily  . simvastatin (ZOCOR) 40 MG tablet take 1 tablet by mouth once daily  . fluticasone (FLONASE) 50 MCG/ACT nasal spray INSTILL 2 SPRAYS INTO EACH NOSTRIL ONCE DAILY  . hydrochlorothiazide (HYDRODIURIL) 25 MG tablet take 1 tablet by mouth once daily    EXAM:  BP 138/60  Pulse 83  Temp(Src) 98.6 F (37 C) (Oral)  Ht 5' 7.5" (1.715 m)  Wt 206 lb (93.441 kg)  BMI 31.77 kg/m2  SpO2 98%  Body mass index is 31.77 kg/(m^2).  Physical Exam: Vital signs reviewed IEP:PIRJ is a  well-developed well-nourished alert cooperative    who appearsr stated age in no acute distress.  HEENT: normocephalic atraumatic , Eyes: PERRL EOM's full, conjunctiva clear, Nares: paten,t no deformity discharge or tenderness. Has small amount of blood in each nostril noted specific lesions no active bleeding, Ears: no deformity at the tip at this time he has a 1 mm dark.that I believe is purplish black no bleeding no petechiae EAC's clear TMs with normal landmarks. Mouth: clear OP, no , edema.  Moist mucous membranes. Dentition i no acute findings NECK: supple without masses, thyromegaly or bruits. CHEST/PULM:  Clear to auscultation and percussion breath sounds equal no wheeze , rales or rhonchi. No chest wall deformities or tenderness. CV: PMI is nondisplaced, S1 S2 no gallops, murmurs, rubs. Peripheral pulses are full without delay.No JVD .  ABDOMEN: Bowel sounds normal nontender  No guard or rebound, no hepato splenomegal felt, no CVA tenderness.  Extremtities:  No clubbing cyanosis , no acute joint swelling or redness no focal atrophy +1-2 edema lower extremity mid calf down NEURO:  Oriented x3, cranial nerves 3-12 appear to be intact, no obvious focal weakness,gait within normal limits no abnormal reflexes or asymmetrical SKIN: No acute rashes normal turgor, color,    fadings bruise left medial thigh  Petechial cayenne  type rash on pretibial area   fiarly extensive  Palms soles ok  PSYCH: Oriented, good eye contact, no obvious depression anxiety, cognition and judgment appear normal. LN: no cervical axillary inguinal adenopathy obvious   ASSESSMENT AND PLAN:  Discussed the following assessment and plan:  Encounter for preventive health examination - Plan: Basic metabolic panel, CBC with Differential, Hepatic function panel, Lipid panel, TSH, Protime-INR, APTT  HYPERLIPIDEMIA - Plan: Basic metabolic panel, CBC with Differential, Hepatic function panel, Lipid panel, TSH, Protime-INR,  APTT  HYPERTENSION - Plan: Basic metabolic panel, CBC with Differential, Hepatic function panel, Lipid panel, TSH, Protime-INR, APTT  Bruising - Plan: Basic metabolic panel, CBC with Differential, Hepatic function panel, Lipid panel, TSH, Protime-INR, APTT  Nosebleed - Plan: Basic metabolic panel, CBC with Differential, Hepatic function panel, Lipid panel, TSH, Protime-INR, APTT  Lesion of tongue - Plan: Basic metabolic panel, CBC with Differential, Hepatic function panel, Lipid panel, TSH, Protime-INR, APTT  Thrombocytopenia, unspecified - new onset  below 20 with physical findings   Patient Care Team: Burnis Medin, MD as PCP - General Irene Shipper, MD as Attending Physician (Gastroenterology) Patient Instructions  Continue lifestyle intervention healthy eating and exercise . Advise seeing counselor about anger  Lab today about LE bruising. . Use compressino stockings  15 - 20 when working and legs down .  Use lots of moisturizer for the flaking area s  Saline nose spray everu day for the nose blood and if not getting better  We can have ent see you . Plan to refer to get someone to look at leasion on  Your tongue Get your eye check. We may need to stop the potassium if you are off the diuretic   unbeknowns to me  If you bp is controlled without it we can continue off of this medication . Will let you know after labs.  Plan rov in 3-4 months depending on labs and how  You are doing,   Standley Brooking. Panosh M.D. Pre visit review using our clinic review tool, if applicable. No additional management support is needed unless otherwise documented below in the visit note.   Lab Results  Component Value Date   WBC 6.4 04/15/2014   HGB 10.9* 04/15/2014   HCT 32.6* 04/15/2014   PLT <20.0 Repeated and verified X2.* 04/15/2014   GLUCOSE 105* 04/15/2014   CHOL 144 04/15/2014   TRIG 142.0 04/15/2014   HDL 39.90 04/15/2014   LDLDIRECT 73.0 04/13/2010   LDLCALC 76 04/15/2014   ALT 44 04/15/2014    AST 49* 04/15/2014   NA 139 04/15/2014   K 3.5 04/15/2014   CL 105 04/15/2014   CREATININE 1.0 04/15/2014   BUN 15 04/15/2014   CO2 25 04/15/2014   TSH 1.69 04/15/2014   PSA 1.60 10/01/2012   INR 1.0 04/15/2014  platelets undetectable on  Machine  Discussed with Dr. Beryle Beams on call  Advised have pt to go to [facility] ED  Patient notified and  To go to ed . By MA CMA .

## 2014-04-15 NOTE — ED Notes (Signed)
Pt transported upstairs by Robin EMT. No tele orders noted.

## 2014-04-15 NOTE — Telephone Encounter (Signed)
Pt's platelet count was less than 20; the machine is getting 5 (critical low).  Scott Waters does not feel that machine can handle that low.  It will be sent for a path review.

## 2014-04-15 NOTE — ED Notes (Signed)
Pt transported upstairs by Robin EMT. No tele order noted.

## 2014-04-15 NOTE — ED Provider Notes (Signed)
CSN: [id number]     Arrival date & time 04/15/14  1646 History   First MD Initiated Contact with Patient 04/15/14 1844     Chief Complaint  Patient presents with  . abnormal labs      (Consider location/radiation/quality/duration/timing/severity/associated sxs/prior Treatment) HPI  [age]-year-old male referred to the emergency room for further evaluation of thrombocytopenia. For the past 3-4 weeks patient has noted gingival bleeding, bruising, epistaxis and black colored stool. She had labs drawn by PCP which is notable for mild anemia and platelets less than 20,000.  No history of prior thrombocytopenia. No recent medication changes. No history of any malignancy or significant rheumatological disease. Denies any headaches.   Past Medical History  Diagnosis Date  . Seasonal allergies   . Hypertension   . Hyperlipidemia   . Hx of colonic polyps 2007  . Gout 04/27/2011    One episode of joint pain at [facility]   Presumed    . Abnormal LFTs 04/27/2011   Past Surgical History  Procedure Laterality Date  . Tonsillectomy    . Polypectomy     Family History  Problem Relation Age of Onset  . Colon cancer Father 3    Died at 42  . Hypertension Mother   . Diabetes Mother   . Kidney disease Mother     diailysis from dm   . Stroke     History  Substance Use Topics  . Smoking status: Former Smoker    Types: Cigarettes  . Smokeless tobacco: Not on file  . Alcohol Use: 7.0 oz/week    14 drink(s) per week    Review of Systems  All systems reviewed and negative, other than as noted in HPI.   Allergies  Review of patient's allergies indicates no known allergies.  Home Medications   Prior to Admission medications   Medication Sig Start Date End Date Taking? Authorizing Provider  AZOR 10-40 MG per tablet take 1 tablet by mouth once daily 12/31/13   Burnis Medin, MD  CINNAMON PO Take by mouth.    Historical Provider, MD  fexofenadine (ALLEGRA) 180 MG tablet Take 180 mg by mouth daily.       Historical Provider, MD  fish oil-omega-3 fatty acids 1000 MG capsule Take 1 g by mouth daily.      Historical Provider, MD  fluticasone (FLONASE) 50 MCG/ACT nasal spray INSTILL 2 SPRAYS INTO EACH NOSTRIL ONCE DAILY 06/25/13   Burnis Medin, MD  GINSENG PO Take by mouth.    Historical Provider, MD  glucosamine-chondroitin 500-400 MG tablet Take 1 tablet by mouth 3 (three) times daily.      Historical Provider, MD  hydrochlorothiazide (HYDRODIURIL) 25 MG tablet take 1 tablet by mouth once daily    Burnis Medin, MD  ibuprofen (ADVIL,MOTRIN) 200 MG tablet Take 200 mg by mouth every 6 (six) hours as needed.      Historical Provider, MD  MULTIPLE VITAMIN PO Take by mouth.    Historical Provider, MD  OVER THE COUNTER MEDICATION Liver aid     Historical Provider, MD  potassium chloride (K-DUR,KLOR-CON) 10 MEQ tablet take 2 tablets by mouth once daily 01/27/14   Burnis Medin, MD  simvastatin (ZOCOR) 40 MG tablet take 1 tablet by mouth once daily 09/12/13   Burnis Medin, MD   BP 148/83  Pulse 100  Temp(Src) 98.7 F (37.1 C) (Oral)  Resp 16  Ht 5\' 8"  (1.727 m)  Wt 205 lb (92.987 kg)  BMI  31.18 kg/m2  SpO2 100% Physical Exam  Nursing note and vitals reviewed. Constitutional: He appears well-developed and well-nourished. No distress.  HENT:  Head: Normocephalic and atraumatic.  Eyes: Conjunctivae are normal. Right eye exhibits no discharge. Left eye exhibits no discharge.  Neck: Neck supple.  Cardiovascular: Normal rate, regular rhythm and normal heart sounds.  Exam reveals no gallop and no friction rub.   No murmur heard. Pulmonary/Chest: Effort normal and breath sounds normal. No respiratory distress.  Abdominal: Soft. He exhibits no distension. There is no tenderness.  Musculoskeletal: He exhibits no edema and no tenderness.  Neurological: He is alert.  Skin: Skin is warm and dry.  Scattered ecchymosis to the extremities  Psychiatric: He has a normal mood and affect. His behavior  is normal. Thought content normal.    ED Course  Procedures (including critical care time) Labs Review Labs Reviewed  CBC - Abnormal; Notable for the following:    RBC 3.53 (*)    Hemoglobin 10.6 (*)    HCT 31.2 (*)    Platelets 15 (*)    All other components within normal limits  COMPREHENSIVE METABOLIC PANEL - Abnormal; Notable for the following:    Potassium 3.4 (*)    Glucose, Bld 124 (*)    AST 47 (*)    GFR calc non Af Amer 72 (*)    GFR calc Af Amer 83 (*)    All other components within normal limits  LACTATE DEHYDROGENASE - Abnormal; Notable for the following:    LDH 535 (*)    All other components within normal limits  D-DIMER, QUANTITATIVE - Abnormal; Notable for the following:    D-Dimer, Quant 1.41 (*)    All other components within normal limits  HAPTOGLOBIN - Abnormal; Notable for the following:    Haptoglobin <25 (*)    All other components within normal limits  COMPREHENSIVE METABOLIC PANEL - Abnormal; Notable for the following:    Sodium 134 (*)    Potassium 3.5 (*)    Glucose, Bld 172 (*)    Albumin 3.0 (*)    GFR calc non Af Amer 88 (*)    All other components within normal limits  CBC - Abnormal; Notable for the following:    RBC 3.28 (*)    Hemoglobin 9.6 (*)    HCT 29.5 (*)    Platelets 3 (*)    All other components within normal limits  RETICULOCYTES - Abnormal; Notable for the following:    Retic Ct Pct 4.1 (*)    RBC. 3.15 (*)    All other components within normal limits  POC OCCULT BLOOD, ED - Abnormal; Notable for the following:    Fecal Occult Bld POSITIVE (*)    All other components within normal limits  SAVE SMEAR  TECHNOLOGIST SMEAR REVIEW  PROTIME-INR  VITAMIN B12  FOLATE  HIV ANTIBODY (ROUTINE TESTING)  HEPATITIS C ANTIBODY  URINALYSIS W MICROSCOPIC  ADAMTS13 ACTIVITY  RETICULOCYTES  CBC  LACTATE DEHYDROGENASE  COMPREHENSIVE METABOLIC PANEL  TYPE AND SCREEN  ABO/RH  THERAPEUTIC PLASMA EXCHANGE (BLOOD BANK)    Imaging  Review No results found.   EKG Interpretation None      MDM   Final diagnoses:  Thrombocytopenia    [age]-year-old male with thrombocytopenia. Platelets 15,000. Reports gingival bleeding, epistaxis, bruising and melanotic-appearing stool. Stool was heme-positive. Mild anemia. No history of this. Etiology is not completely clear. Appreciate prompt evaluation by Heme/Onc on patient's arrival to the emergency room. Possible ITP. Treatment  recommendations pending review of peripheral smear. Discussed with medicine for admission.   Virgel Manifold, MD 04/16/14 [phone]

## 2014-04-15 NOTE — Progress Notes (Signed)
Attempted to call report. Jasmine , RN asked if she could call back. Given number [phone].

## 2014-04-15 NOTE — Telephone Encounter (Signed)
Tell patient that his platelet count is very low  And could be cause of the bleeding  dont know why it is low   . Do not take any aspirin ibuprofen . Stop the fish oil and any supplements  Can stay on the BP meds  We need  To get him a  Hematology consult stat. If any excess bleeding over the weekend go to the ed.

## 2014-04-15 NOTE — ED Notes (Signed)
2 RNs attempted to complete IV. No success. IV team called.

## 2014-04-15 NOTE — ED Notes (Signed)
Pt has swelling in left hand from lab attempt. Icepack given. 5W RN made aware. 5W RN also made aware of bleeding in mouth and previous nose bleeds. Also, that sign and held orders in system. Will continue to monitor.

## 2014-04-15 NOTE — ED Notes (Signed)
Oncologist at bedside. 

## 2014-04-15 NOTE — Progress Notes (Signed)
Report received from [place], [place] in ED.

## 2014-04-15 NOTE — Telephone Encounter (Signed)
Also tried reaching the contact person.

## 2014-04-15 NOTE — ED Notes (Signed)
IV team stated they will come start an IV

## 2014-04-16 ENCOUNTER — Inpatient Hospital Stay (HOSPITAL_COMMUNITY): Payer: BC Managed Care – PPO

## 2014-04-16 ENCOUNTER — Encounter (HOSPITAL_COMMUNITY): Payer: Self-pay | Admitting: General Practice

## 2014-04-16 DIAGNOSIS — R799 Abnormal finding of blood chemistry, unspecified: Secondary | ICD-10-CM

## 2014-04-16 DIAGNOSIS — R7401 Elevation of levels of liver transaminase levels: Secondary | ICD-10-CM

## 2014-04-16 DIAGNOSIS — I10 Essential (primary) hypertension: Secondary | ICD-10-CM

## 2014-04-16 DIAGNOSIS — R7402 Elevation of levels of lactic acid dehydrogenase (LDH): Secondary | ICD-10-CM

## 2014-04-16 DIAGNOSIS — R740 Nonspecific elevation of levels of transaminase and lactic acid dehydrogenase [LDH]: Secondary | ICD-10-CM

## 2014-04-16 DIAGNOSIS — K921 Melena: Secondary | ICD-10-CM

## 2014-04-16 DIAGNOSIS — D65 Disseminated intravascular coagulation [defibrination syndrome]: Secondary | ICD-10-CM

## 2014-04-16 LAB — CBC
HEMATOCRIT: 29.5 % — AB (ref 39.0–52.0)
HEMOGLOBIN: 9.6 g/dL — AB (ref 13.0–17.0)
MCH: 29.3 pg (ref 26.0–34.0)
MCHC: 32.5 g/dL (ref 30.0–36.0)
MCV: 89.9 fL (ref 78.0–100.0)
Platelets: 3 10*3/uL — CL (ref 150–400)
RBC: 3.28 MIL/uL — ABNORMAL LOW (ref 4.22–5.81)
RDW: 15.5 % (ref 11.5–15.5)
WBC: 6.9 10*3/uL (ref 4.0–10.5)

## 2014-04-16 LAB — URINALYSIS W MICROSCOPIC (NOT AT ARMC)
BILIRUBIN URINE: NEGATIVE
GLUCOSE, UA: NEGATIVE mg/dL
Hgb urine dipstick: NEGATIVE
Ketones, ur: NEGATIVE mg/dL
LEUKOCYTES UA: NEGATIVE
NITRITE: NEGATIVE
PH: 5.5 (ref 5.0–8.0)
Protein, ur: 30 mg/dL — AB
Specific Gravity, Urine: 1.023 (ref 1.005–1.030)
Urobilinogen, UA: 0.2 mg/dL (ref 0.0–1.0)

## 2014-04-16 LAB — PROTIME-INR
INR: 1.07 (ref 0.00–1.49)
Prothrombin Time: 13.7 seconds (ref 11.6–15.2)

## 2014-04-16 LAB — COMPREHENSIVE METABOLIC PANEL
ALT: 32 U/L (ref 0–53)
AST: 33 U/L (ref 0–37)
Albumin: 3 g/dL — ABNORMAL LOW (ref 3.5–5.2)
Alkaline Phosphatase: 93 U/L (ref 39–117)
BUN: 15 mg/dL (ref 6–23)
CALCIUM: 8.4 mg/dL (ref 8.4–10.5)
CO2: 24 mEq/L (ref 19–32)
Chloride: 100 mEq/L (ref 96–112)
Creatinine, Ser: 0.96 mg/dL (ref 0.50–1.35)
GFR calc Af Amer: >90 mL/min (ref >90)
GFR calc non Af Amer: 88 mL/min — ABNORMAL LOW (ref >90)
Glucose, Bld: 172 mg/dL — ABNORMAL HIGH (ref 70–99)
Potassium: 3.5 mEq/L — ABNORMAL LOW (ref 3.7–5.3)
SODIUM: 134 meq/L — AB (ref 137–147)
TOTAL PROTEIN: 7.7 g/dL (ref 6.0–8.3)
Total Bilirubin: 0.7 mg/dL (ref 0.3–1.2)

## 2014-04-16 LAB — HEPATITIS C ANTIBODY: HCV AB: NEGATIVE

## 2014-04-16 LAB — ABO/RH: ABO/RH(D): A POS

## 2014-04-16 LAB — RETICULOCYTES
RBC.: 3.15 MIL/uL — ABNORMAL LOW (ref 4.22–5.81)
Retic Count, Absolute: 129.2 10*3/uL (ref 19.0–186.0)
Retic Ct Pct: 4.1 % — ABNORMAL HIGH (ref 0.4–3.1)

## 2014-04-16 LAB — HIV ANTIBODY (ROUTINE TESTING W REFLEX): HIV: NONREACTIVE

## 2014-04-16 LAB — FOLATE: Folate: >20 ng/mL

## 2014-04-16 LAB — HAPTOGLOBIN: Haptoglobin: <25 mg/dL — ABNORMAL LOW (ref 45–215)

## 2014-04-16 LAB — VITAMIN B12: Vitamin B-12: 326 pg/mL (ref 211–911)

## 2014-04-16 MED ORDER — ACD FORMULA A 0.73-2.45-2.2 GM/100ML VI SOLN
Status: AC
  Administered 2014-04-16: 500 mL via INTRAVENOUS
  Filled 2014-04-16: qty 500

## 2014-04-16 MED ORDER — HEPARIN SODIUM (PORCINE) 1000 UNIT/ML IJ SOLN
INTRAMUSCULAR | Status: AC
  Filled 2014-04-16: qty 1

## 2014-04-16 MED ORDER — ACD FORMULA A 0.73-2.45-2.2 GM/100ML VI SOLN
500.0000 mL | Status: DC
  Administered 2014-04-16: 500 mL via INTRAVENOUS
  Filled 2014-04-16: qty 500

## 2014-04-16 MED ORDER — DIPHENHYDRAMINE HCL 25 MG PO CAPS
25.0000 mg | ORAL_CAPSULE | Freq: Four times a day (QID) | ORAL | Status: DC | PRN
  Administered 2014-04-16 – 2014-04-18 (×2): 25 mg via ORAL

## 2014-04-16 MED ORDER — ACETAMINOPHEN 325 MG PO TABS: 650.0000 mg | ORAL_TABLET | ORAL | Status: DC | PRN

## 2014-04-16 MED ORDER — ANTICOAGULANT SODIUM CITRATE 4% (200MG/5ML) IV SOLN
5.0000 mL | Freq: Once | Status: AC
  Administered 2014-04-16: 5 mL
  Filled 2014-04-16: qty 250

## 2014-04-16 MED ORDER — DIPHENHYDRAMINE HCL 50 MG/ML IJ SOLN
INTRAMUSCULAR | Status: AC
  Filled 2014-04-16: qty 1

## 2014-04-16 MED ORDER — DIPHENHYDRAMINE HCL 50 MG/ML IJ SOLN
12.5000 mg | Freq: Once | INTRAMUSCULAR | Status: AC
  Administered 2014-04-16: 12.5 mg via INTRAVENOUS

## 2014-04-16 MED ORDER — CALCIUM GLUCONATE 10 % IV SOLN
4.0000 g | Freq: Every day | INTRAVENOUS | Status: DC
  Administered 2014-04-16: 4 g via INTRAVENOUS
  Filled 2014-04-16 (×4): qty 40

## 2014-04-16 MED ORDER — FAMOTIDINE IN NACL 20-0.9 MG/50ML-% IV SOLN
20.0000 mg | Freq: Once | INTRAVENOUS | Status: AC
  Administered 2014-04-16: 20 mg via INTRAVENOUS
  Filled 2014-04-16 (×2): qty 50

## 2014-04-16 MED ORDER — CALCIUM CARBONATE ANTACID 500 MG PO CHEW
2.0000 | CHEWABLE_TABLET | ORAL | Status: AC
  Administered 2014-04-16 (×2): 400 mg via ORAL
  Filled 2014-04-16 (×2): qty 2

## 2014-04-16 MED ORDER — DIPHENHYDRAMINE HCL 25 MG PO CAPS
ORAL_CAPSULE | ORAL | Status: AC
  Filled 2014-04-16: qty 1

## 2014-04-16 MED ORDER — CALCIUM CARBONATE ANTACID 500 MG PO CHEW
CHEWABLE_TABLET | ORAL | Status: AC
  Filled 2014-04-16: qty 2

## 2014-04-16 NOTE — Progress Notes (Signed)
Pt admitted to unit by stretcher, pt walks to bed in room [phone]. Pt alert and oriented x4. IV intact, skin intact. Pt has old dried blood to tongue, and ecchymotic areas to BLE. Oral care protocol placed. Pt understands and can demonstrate on how to use call bell and get into contact with nursing staff. Call bell within reach. Will continue to monitor patient per MD orders.

## 2014-04-16 NOTE — Progress Notes (Signed)
Noted pt had small hive like bumps upon return from plasmapheresis. Dr. Fenton Malling orders received will cont to monitor.

## 2014-04-16 NOTE — Progress Notes (Signed)
PATIENT DETAILS Name: Scott Waters Age: 58 y.o. 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 Have contacted IR for catheter placement. Dr [name] will make arrangements for Plasmapharesis. Dr [name] recommends to stop IVIG, but to continue to steroids. Currently completely awake and alert, no further nose bleeding. Mild bruising in left lower leg-since last week-but no new bruises seen. Stools now brown in color  Epistaxis -resolved -secondary to thrombocytopenia  Melena -resolved-likely secondary to swallowed blood from Epistaxis. Do not think this is a GI bleed at this time. Continue PPI, and follow clinical course. Admitting MD d/w GI on call-unable to perform endoscopic procedures given severe thrombytopenia  Anemia/Thrombocytopenia -at this time, this is likely secondary to TTP-for plasmapharesis once Catheter is placed  HTN -stable with Amlodipine  Disposition: Remain inpatient  DVT Prophylaxis: TED Hose  Code Status: Full code   Family Communication None at bedside  Procedures:  None  CONSULTS:  hematology/oncology and IR  Time spent 40 minutes-which includes 50% of the time with face-to-face with patient/ family and coordinating care related to the above assessment and plan.    MEDICATIONS: Scheduled Meds: . amLODipine  5 mg Oral Daily  . antiseptic oral rinse  15 mL Mouth Rinse q12n4p  . chlorhexidine  15 mL Mouth Rinse BID  . dexamethasone  40 mg Oral Daily  . folic acid  1 mg Oral Daily  . Immune Globulin 5%  1 g/kg Intravenous Q24 Hr x 2  .  pantoprazole (PROTONIX) IV  40 mg Intravenous Q12H  . sodium chloride  3 mL Intravenous Q12H  . thiamine  100 mg Oral Daily   Or  . thiamine  100 mg Intravenous Daily   Continuous Infusions:  PRN Meds:.acetaminophen, acetaminophen, fluticasone, LORazepam, LORazepam  Antibiotics: Anti-infectives   None       PHYSICAL EXAM: Vital signs in last 24 hours: Filed Vitals:   04/16/14 0254 04/16/14 0500 04/16/14 0539 04/16/14 1013  BP: 134/75  125/73 100/80  Pulse: 81  76   Temp: 98.6 F (37 C)  98.2 F (36.8 C)   TempSrc: Oral  Oral   Resp:   16   Height:      Weight:  92.4 kg (203 lb 11.3 oz)    SpO2: 98%  97%     Weight change:  Filed Weights   04/15/14 1652 04/15/14 2125 04/16/14 0500  Weight: 92.987 kg (205 lb) 91.8 kg (202 lb 6.1 oz) 92.4 kg (203 lb 11.3 oz)   Body mass index is 30.98 kg/(m^2).   Gen Exam: Awake and alert with clear speech.   Neck: Supple, No JVD.   Chest: B/L Clear.   CVS: S1 S2 Regular, no murmurs.  Abdomen: soft, BS +, non tender, non distended.  Extremities: no edema, lower extremities warm to touch. Neurologic: Non Focal.   Skin: Mild bruising in the left lower leg-no other purpuric sites seen.  Wounds: N/A.    Intake/Output from previous day:  Intake/Output Summary (Last 24 hours) at 04/16/14 1128 Last data filed at 04/16/14 0014  Gross per 24 hour  Intake    120 ml  Output  0 ml  Net    120 ml     LAB RESULTS: CBC  Recent Labs Lab 04/15/14 0956 04/15/14 1700 04/16/14 0428  WBC 6.4 7.4 6.9  HGB 10.9* 10.6* 9.6*  HCT 32.6* 31.2* 29.5*  PLT <20.0 Repeated and verified X2.* 15* 3*  MCV 89.7 88.4 89.9  MCH  --  30.0 29.3  MCHC 33.4 34.0 32.5  RDW 15.9* 15.5 15.5  LYMPHSABS 1.0  --   --   MONOABS 0.5  --   --   EOSABS 0.2  --   --   BASOSABS 0.0  --   --     Chemistries   Recent Labs Lab 04/15/14 0956 04/15/14 1700 04/16/14 0428  NA 139 141 134*  K 3.5 3.4* 3.5*  CL 105 105 100  CO2 25 22 24   GLUCOSE 105*  124* 172*  BUN 15 21 15   CREATININE 1.0 1.09 0.96  CALCIUM 9.2 8.9 8.4    CBG: No results found for this basename: GLUCAP,  in the last 168 hours  GFR Estimated Creatinine Clearance: 90.3 ml/min (by C-G formula based on Cr of 0.96).  Coagulation profile  Recent Labs Lab 04/15/14 0956 04/16/14 0428  INR 1.0 1.07    Cardiac Enzymes No results found for this basename: CK, CKMB, TROPONINI, MYOGLOBIN,  in the last 168 hours  No components found with this basename: POCBNP,   Recent Labs  04/15/14 1920  DDIMER 1.41*   No results found for this basename: HGBA1C,  in the last 72 hours  Recent Labs  04/15/14 0956  CHOL 144  HDL 39.90  LDLCALC 76  TRIG 142.0  CHOLHDL 4    Recent Labs  04/15/14 0956  TSH 1.69   No results found for this basename: VITAMINB12, FOLATE, FERRITIN, TIBC, IRON, RETICCTPCT,  in the last 72 hours No results found for this basename: LIPASE, AMYLASE,  in the last 72 hours  Urine Studies No results found for this basename: UACOL, UAPR, USPG, UPH, UTP, UGL, UKET, UBIL, UHGB, UNIT, UROB, ULEU, UEPI, UWBC, URBC, UBAC, CAST, CRYS, UCOM, BILUA,  in the last 72 hours  MICROBIOLOGY: No results found for this or any previous visit (from the past 240 hour(s)).  RADIOLOGY STUDIES/RESULTS: No results found.  Jonetta Osgood, MD  [facility] Pager:[phone]  If 7PM-7AM, please contact night-coverage [email or web address] Password [email or web address] 04/16/2014, 11:28 AM   LOS: 1 day   **Disclaimer: This note may have been dictated with voice recognition software. Similar sounding words can inadvertently be transcribed and this note may contain transcription errors which may not have been corrected upon publication of note.**

## 2014-04-16 NOTE — Procedures (Signed)
Interventional Radiology Procedure Note  Procedure:  Placement of a right IJ Trialysis catheter.  Tip in mid RA and ready for use.  Complications: none Recommendations: - Routine line care  Signed,  Criselda Peaches, MD [facility]

## 2014-04-16 NOTE — Progress Notes (Addendum)
He is looking real good.  However, I think that he may have TTP.  This is the 1 diagnoses that we cannot overlook. It appears that he has hemolysis. His LDH is high. His haptoglobin is low. We do not have a reticulocyte count on him. However, I think that given the profound thrombocytopenia and the hemolysis, I do we have to presume that this is TTP and treated with plasma exchange.  I looked at his blood smear. He has a rare schistocytes. However, with acute TTP, there may not be schistocytes.  He has no neurological issues. There is no fever. He is not bleeding.  He did get some Decadron and then IVIG. His platelet count is 3000 today. Hemoglobin is 9.6.  On his exam, he is afebrile. Temperature is 98.2. Pulse 76. Blood pressure 100/80. Head and neck exam shows no ocular or oral lesions. He has no palpable cervical or supraclavicular lymph nodes. Lungs are clear. Cardiac exam regular rate and rhythm. Abdomen soft. There is no palpable liver or spleen tip. Extremities shows some ecchymoses. She has good range of motion of the joints. Skin exam shows ecchymoses. He has some petechia. Neurological exam is non-focal.  Again, I think we have to presume TTP unless proven negative. I think we need to be proactive and start plasma  exchange on him.  [name] send off a ADAMTS-13 assay. Of course, with the weekend,we probably will not get this back for several days.  We will continue the Decadron for now. I will d/c the IVIG.  I spoke to Mr. Millington at length. I told him of my concerns. I told him that would have to be aggressive and start plasma exchange  just so that we can prevent any potential complication if he does have TTP. He does understand this. This is. a lot for him to handle right now. He is doing a good job.  I have spoken with the dialysis unit. I have spoken with the blood bank. Have spoken with Dr. Sloan Leiter. We will start everything today.  We will have to follow along closely.  This is a  potentially serious problem and I believe that we have to be aggressive.  He is doing well and I want to make sure that we try to minimize any complications if there is TTP.  [name]  Romans 8:28  I have

## 2014-04-17 DIAGNOSIS — M1990 Unspecified osteoarthritis, unspecified site: Secondary | ICD-10-CM

## 2014-04-17 DIAGNOSIS — J309 Allergic rhinitis, unspecified: Secondary | ICD-10-CM

## 2014-04-17 DIAGNOSIS — M311 Thrombotic microangiopathy, unspecified: Secondary | ICD-10-CM

## 2014-04-17 DIAGNOSIS — R609 Edema, unspecified: Secondary | ICD-10-CM

## 2014-04-17 DIAGNOSIS — D72819 Decreased white blood cell count, unspecified: Secondary | ICD-10-CM

## 2014-04-17 DIAGNOSIS — T148XXA Other injury of unspecified body region, initial encounter: Secondary | ICD-10-CM

## 2014-04-17 DIAGNOSIS — D696 Thrombocytopenia, unspecified: Secondary | ICD-10-CM

## 2014-04-17 LAB — THERAPEUTIC PLASMA EXCHANGE (BLOOD BANK)
Plasma Exchange: 3501
Plasma volume needed: 3500
UNIT DIVISION: 0
UNIT DIVISION: 0
UNIT DIVISION: 0
UNIT DIVISION: 0
UNIT DIVISION: 0
UNIT DIVISION: 0
Unit division: 0
Unit division: 0
Unit division: 0
Unit division: 0
Unit division: 0
Unit division: 0
Unit division: 0

## 2014-04-17 LAB — COMPREHENSIVE METABOLIC PANEL
ALBUMIN: 3.2 g/dL — AB (ref 3.5–5.2)
ALT: 23 U/L (ref 0–53)
AST: 22 U/L (ref 0–37)
Alkaline Phosphatase: 79 U/L (ref 39–117)
BUN: 22 mg/dL (ref 6–23)
CHLORIDE: 101 meq/L (ref 96–112)
CO2: 26 mEq/L (ref 19–32)
CREATININE: 0.98 mg/dL (ref 0.50–1.35)
Calcium: 8.8 mg/dL (ref 8.4–10.5)
GFR calc Af Amer: >90 mL/min (ref >90)
GFR calc non Af Amer: 88 mL/min — ABNORMAL LOW (ref >90)
Glucose, Bld: 147 mg/dL — ABNORMAL HIGH (ref 70–99)
Potassium: 3.5 mEq/L — ABNORMAL LOW (ref 3.7–5.3)
Sodium: 139 mEq/L (ref 137–147)
TOTAL PROTEIN: 6.9 g/dL (ref 6.0–8.3)
Total Bilirubin: 0.4 mg/dL (ref 0.3–1.2)

## 2014-04-17 LAB — LACTATE DEHYDROGENASE: LDH: 297 U/L — AB (ref 94–250)

## 2014-04-17 LAB — RETICULOCYTES
RBC.: 3.07 MIL/uL — AB (ref 4.22–5.81)
RETIC CT PCT: 4.6 % — AB (ref 0.4–3.1)
Retic Count, Absolute: 141.2 10*3/uL (ref 19.0–186.0)

## 2014-04-17 LAB — CBC
HCT: 27.2 % — ABNORMAL LOW (ref 39.0–52.0)
Hemoglobin: 9.3 g/dL — ABNORMAL LOW (ref 13.0–17.0)
MCH: 30.3 pg (ref 26.0–34.0)
MCHC: 34.2 g/dL (ref 30.0–36.0)
MCV: 88.6 fL (ref 78.0–100.0)
PLATELETS: 18 10*3/uL — AB (ref 150–400)
RBC: 3.07 MIL/uL — AB (ref 4.22–5.81)
RDW: 15.7 % — AB (ref 11.5–15.5)
WBC: 19.3 10*3/uL — ABNORMAL HIGH (ref 4.0–10.5)

## 2014-04-17 MED ORDER — CALCIUM GLUCONATE 10 % IV SOLN
4.0000 g | Freq: Once | INTRAVENOUS | Status: DC
  Filled 2014-04-17: qty 40

## 2014-04-17 MED ORDER — SODIUM CHLORIDE 0.9 % IJ SOLN
10.0000 mL | INTRAMUSCULAR | Status: DC | PRN
  Administered 2014-04-17 – 2014-04-22 (×9): 10 mL

## 2014-04-17 MED ORDER — CALCIUM CARBONATE ANTACID 500 MG PO CHEW
2.0000 | CHEWABLE_TABLET | ORAL | Status: AC
  Administered 2014-04-18: 400 mg via ORAL
  Filled 2014-04-17 (×2): qty 2

## 2014-04-17 MED ORDER — ACD FORMULA A 0.73-2.45-2.2 GM/100ML VI SOLN
500.0000 mL | Status: DC
Start: 2014-04-17 — End: 2014-04-17

## 2014-04-17 MED ORDER — ACETAMINOPHEN 325 MG PO TABS: 650.0000 mg | ORAL_TABLET | ORAL | Status: DC | PRN

## 2014-04-17 MED ORDER — PANTOPRAZOLE SODIUM 40 MG PO TBEC
40.0000 mg | DELAYED_RELEASE_TABLET | Freq: Two times a day (BID) | ORAL | Status: DC
  Administered 2014-04-17 – 2014-04-19 (×5): 40 mg via ORAL
  Filled 2014-04-17 (×5): qty 1

## 2014-04-17 MED ORDER — DIPHENHYDRAMINE HCL 25 MG PO CAPS
25.0000 mg | ORAL_CAPSULE | Freq: Four times a day (QID) | ORAL | Status: DC | PRN
  Administered 2014-04-18: 25 mg via ORAL

## 2014-04-17 MED ORDER — SODIUM CHLORIDE 0.9 % IJ SOLN
10.0000 mL | Freq: Two times a day (BID) | INTRAMUSCULAR | Status: DC
Start: 2014-04-17 — End: 2014-04-22

## 2014-04-17 MED ORDER — ANTICOAGULANT SODIUM CITRATE 4% (200MG/5ML) IV SOLN
5.0000 mL | Freq: Once | Status: AC
  Administered 2014-04-18: 2.8 mL
  Filled 2014-04-17: qty 250

## 2014-04-17 NOTE — Progress Notes (Signed)
PATIENT DETAILS Name: Scott Waters Age: 58 y.o. Sex: male Date of Birth: 01-Jan-1956 Admit Date: 04/15/2014 Admitting Physician Berle Mull, MD GMW:[id number] Harmon Pier, MD  Subjective Denies any complaints, denies any bleeding from his mouth or rectally.  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 Have contacted IR for catheter placement. Dr [name] will make arrangements for Plasmapharesis.  -Steroids and IVIG discontinued per oncology recommendation. -Started plasmapheresis yesterday, platelets improved to 18 from 3. -Per oncology continue diaphoresis and follow platelets closely, likely to continue plasmapheresis till platelets >100,000.  Epistaxis -resolved -secondary to thrombocytopenia  Melena -resolved-likely secondary to swallowed blood from Epistaxis. Do not think this is a GI bleed at this time. Continue PPI, and follow clinical course. Admitting MD d/w GI on call-unable to perform endoscopic procedures given severe thrombytopenia  Anemia/Thrombocytopenia -at this time, this is likely secondary to TTP-for plasmapharesis once Catheter is placed  HTN -stable with Amlodipine  Disposition: Remain inpatient  DVT Prophylaxis: TED Hose  Code Status: Full code   Family Communication None at bedside  Procedures:  None  CONSULTS:  hematology/oncology and IR  Time spent 40 minutes-which includes 50% of the time with face-to-face with patient/ family and coordinating care related to the above assessment and plan.    MEDICATIONS: Scheduled Meds: . amLODipine  5 mg Oral Daily  . antiseptic oral rinse  15 mL Mouth Rinse q12n4p  . calcium gluconate IVPB  4 g Intravenous Daily  . chlorhexidine  15 mL Mouth Rinse BID  . folic acid  1 mg Oral Daily  . pantoprazole  40 mg Oral BID  . sodium chloride  10-40 mL Intracatheter Q12H   . sodium chloride  3 mL Intravenous Q12H  . thiamine  100 mg Oral Daily   Or  . thiamine  100 mg Intravenous Daily   Continuous Infusions: . citrate dextrose     PRN Meds:.acetaminophen, acetaminophen, diphenhydrAMINE, fluticasone, LORazepam, LORazepam, sodium chloride  Antibiotics: Anti-infectives   None       PHYSICAL EXAM: Vital signs in last 24 hours: Filed Vitals:   04/16/14 2134 04/17/14 0500 04/17/14 0534 04/17/14 1004  BP: 132/71  118/66 151/74  Pulse: 82  82   Temp: 97.9 F (36.6 C)  98.1 F (36.7 C)   TempSrc: Oral  Oral   Resp: 18  18   Height:      Weight:  92.9 kg (204 lb 12.9 oz)    SpO2: 97%  98%     Weight change: -0.087 kg (-3.1 oz) Filed Weights   04/15/14 2125 04/16/14 0500 04/17/14 0500  Weight: 91.8 kg (202 lb 6.1 oz) 92.4 kg (203 lb 11.3 oz) 92.9 kg (204 lb 12.9 oz)   Body mass index is 31.15 kg/(m^2).   Gen Exam: Awake and alert with clear speech.   Neck: Supple, No JVD.   Chest: B/L Clear.   CVS: S1 S2 Regular, no murmurs.  Abdomen: soft, BS +, non tender, non distended.  Extremities: no edema, lower extremities warm to touch. Neurologic: Non Focal.   Skin: Mild bruising in the left lower leg-no other purpuric sites seen.  Wounds: N/A.    Intake/Output from previous day:  Intake/Output Summary (Last 24 hours) at 04/17/14 1144 Last data filed at 04/16/14 2000  Gross per 24 hour  Intake    480 ml  Output  0 ml  Net    480 ml     LAB RESULTS: CBC  Recent Labs Lab 04/15/14 0956 04/15/14 1700 04/16/14 0428 04/17/14 0650  WBC 6.4 7.4 6.9 19.3*  HGB 10.9* 10.6* 9.6* 9.3*  HCT 32.6* 31.2* 29.5* 27.2*  PLT <20.0 Repeated and verified X2.* 15* 3* 18*  MCV 89.7 88.4 89.9 88.6  MCH  --  30.0 29.3 30.3  MCHC 33.4 34.0 32.5 34.2  RDW 15.9* 15.5 15.5 15.7*  LYMPHSABS 1.0  --   --   --   MONOABS 0.5  --   --   --   EOSABS 0.2  --   --   --   BASOSABS 0.0  --   --   --     Chemistries   Recent Labs Lab 04/15/14 0956  04/15/14 1700 04/16/14 0428 04/17/14 0650  NA 139 141 134* 139  K 3.5 3.4* 3.5* 3.5*  CL 105 105 100 101  CO2 25 22 24 26   GLUCOSE 105* 124* 172* 147*  BUN 15 21 15 22   CREATININE 1.0 1.09 0.96 0.98  CALCIUM 9.2 8.9 8.4 8.8    CBG: No results found for this basename: GLUCAP,  in the last 168 hours  GFR Estimated Creatinine Clearance: 88.7 ml/min (by C-G formula based on Cr of 0.98).  Coagulation profile  Recent Labs Lab 04/15/14 0956 04/16/14 0428  INR 1.0 1.07    Cardiac Enzymes No results found for this basename: CK, CKMB, TROPONINI, MYOGLOBIN,  in the last 168 hours  No components found with this basename: POCBNP,   Recent Labs  04/15/14 1920  DDIMER 1.41*   No results found for this basename: HGBA1C,  in the last 72 hours  Recent Labs  04/15/14 0956  CHOL 144  HDL 39.90  LDLCALC 76  TRIG 142.0  CHOLHDL 4    Recent Labs  04/15/14 0956  TSH 1.69    Recent Labs  04/15/14 2250 04/16/14 1345 04/17/14 0650  VITAMINB12 326  --   --   FOLATE >20.0  --   --   RETICCTPCT  --  4.1* 4.6*   No results found for this basename: LIPASE, AMYLASE,  in the last 72 hours  Urine Studies No results found for this basename: UACOL, UAPR, USPG, UPH, UTP, UGL, UKET, UBIL, UHGB, UNIT, UROB, ULEU, UEPI, UWBC, URBC, UBAC, CAST, CRYS, UCOM, BILUA,  in the last 72 hours  MICROBIOLOGY: No results found for this or any previous visit (from the past 240 hour(s)).  RADIOLOGY STUDIES/RESULTS: No results found.  Verlee Monte, MD  [facility] Pager:[phone]  If 7PM-7AM, please contact night-coverage [email or web address] Password [email or web address] 04/17/2014, 11:44 AM   LOS: 2 days   **Disclaimer: This note may have been dictated with voice recognition software. Similar sounding words can inadvertently be transcribed and this note may contain transcription errors which may not have been corrected upon publication of note.**

## 2014-04-17 NOTE — Progress Notes (Signed)
Scott Waters tolerated the first day of plasma exchange. He had no problems with bleeding. His central I went without difficulties.  He's had no fever. He's had no bleeding. He's had no nausea vomiting.  His platelet count is 18,000 today. His hemoglobin is 9.3. LDH is down to 297. Reticulocyte count is 4.6.  His vital signs are all stable. Temperature is 98.1. Blood pressure 151/74. His head and neck exam shows no ocular or oral lesions. There are no palpable cervical or supraclavicular lymph nodes. Lungs are clear. Cardiac exam regular in rhythm. Abdomen is soft. Has good bowel sounds. There is no fluid wave. There is no palpable liver or spleen tip. Extremities shows some slight edema in his lower legs. Has good range of motion of his joints. He has no rashes. There's still some scattered ecchymoses. Neurological exam is nonfocal.  We will continue the plasma exchange for right now. I still feel that the diagnosis of TTP is probable. We will await the ADAMTS-13 assay. This is sent out. Am not sure how long it will take him back.  We will continue to monitor his counts and labs daily.  Once we get his platelet count above 100,000, that we can begin to transition him as an outpatient.  [name]

## 2014-04-18 DIAGNOSIS — R110 Nausea: Secondary | ICD-10-CM

## 2014-04-18 DIAGNOSIS — M7989 Other specified soft tissue disorders: Secondary | ICD-10-CM

## 2014-04-18 DIAGNOSIS — R040 Epistaxis: Secondary | ICD-10-CM

## 2014-04-18 LAB — THERAPEUTIC PLASMA EXCHANGE (BLOOD BANK)
PLASMA VOLUME NEEDED: 3500
UNIT DIVISION: 0
UNIT DIVISION: 0
UNIT DIVISION: 0
UNIT DIVISION: 0
UNIT DIVISION: 0
UNIT DIVISION: 0
UNIT DIVISION: 0
Unit division: 0
Unit division: 0
Unit division: 0
Unit division: 0
Unit division: 0
Unit division: 0
Unit division: 0

## 2014-04-18 LAB — CBC
HCT: 28.2 % — ABNORMAL LOW (ref 39.0–52.0)
HCT: 30.4 % — ABNORMAL LOW (ref 39.0–52.0)
Hemoglobin: 9.1 g/dL — ABNORMAL LOW (ref 13.0–17.0)
Hemoglobin: 9.8 g/dL — ABNORMAL LOW (ref 13.0–17.0)
MCH: 29.3 pg (ref 26.0–34.0)
MCH: 29.3 pg (ref 26.0–34.0)
MCHC: 32.3 g/dL (ref 30.0–36.0)
MCHC: 32.2 g/dL (ref 30.0–36.0)
MCV: 90.7 fL (ref 78.0–100.0)
MCV: 91 fL (ref 78.0–100.0)
PLATELETS: 22 10*3/uL — AB (ref 150–400)
PLATELETS: 26 10*3/uL — AB (ref 150–400)
RBC: 3.11 MIL/uL — ABNORMAL LOW (ref 4.22–5.81)
RBC: 3.34 MIL/uL — AB (ref 4.22–5.81)
RDW: 15.7 % — AB (ref 11.5–15.5)
RDW: 15.4 % (ref 11.5–15.5)
WBC: 13.9 10*3/uL — AB (ref 4.0–10.5)
WBC: 14.6 10*3/uL — ABNORMAL HIGH (ref 4.0–10.5)

## 2014-04-18 LAB — RENAL FUNCTION PANEL
Albumin: 3.5 g/dL (ref 3.5–5.2)
BUN: 16 mg/dL (ref 6–23)
CO2: 32 meq/L (ref 19–32)
Calcium: 9.1 mg/dL (ref 8.4–10.5)
Chloride: 100 mEq/L (ref 96–112)
Creatinine, Ser: 0.96 mg/dL (ref 0.50–1.35)
GFR, EST NON AFRICAN AMERICAN: 88 mL/min — AB (ref >90)
Glucose, Bld: 137 mg/dL — ABNORMAL HIGH (ref 70–99)
PHOSPHORUS: 2.9 mg/dL (ref 2.3–4.6)
Potassium: 3 mEq/L — ABNORMAL LOW (ref 3.7–5.3)
Sodium: 142 mEq/L (ref 137–147)

## 2014-04-18 LAB — COMPREHENSIVE METABOLIC PANEL
ALBUMIN: 3.3 g/dL — AB (ref 3.5–5.2)
ALT: 26 U/L (ref 0–53)
AST: 25 U/L (ref 0–37)
Alkaline Phosphatase: 63 U/L (ref 39–117)
BUN: 16 mg/dL (ref 6–23)
CALCIUM: 9.2 mg/dL (ref 8.4–10.5)
CO2: 36 mEq/L — ABNORMAL HIGH (ref 19–32)
CREATININE: 0.97 mg/dL (ref 0.50–1.35)
Chloride: 100 mEq/L (ref 96–112)
GFR calc Af Amer: >90 mL/min (ref >90)
GFR calc non Af Amer: 88 mL/min — ABNORMAL LOW (ref >90)
Glucose, Bld: 100 mg/dL — ABNORMAL HIGH (ref 70–99)
Potassium: 3.2 mEq/L — ABNORMAL LOW (ref 3.7–5.3)
Sodium: 144 mEq/L (ref 137–147)
TOTAL PROTEIN: 6 g/dL (ref 6.0–8.3)
Total Bilirubin: 0.4 mg/dL (ref 0.3–1.2)

## 2014-04-18 LAB — POCT I-STAT, CHEM 8
BUN: 15 mg/dL (ref 6–23)
CALCIUM ION: 1.19 mmol/L (ref 1.13–1.30)
Chloride: 95 mEq/L — ABNORMAL LOW (ref 96–112)
Creatinine, Ser: 1.1 mg/dL (ref 0.50–1.35)
Glucose, Bld: 141 mg/dL — ABNORMAL HIGH (ref 70–99)
HEMATOCRIT: 37 % — AB (ref 39.0–52.0)
HEMOGLOBIN: 12.6 g/dL — AB (ref 13.0–17.0)
Potassium: 2.9 mEq/L — CL (ref 3.7–5.3)
Sodium: 143 mEq/L (ref 137–147)
TCO2: 30 mmol/L (ref 0–100)

## 2014-04-18 LAB — DIRECT ANTIGLOBULIN TEST (NOT AT ARMC)
DAT, IgG: NEGATIVE
DAT, complement: NEGATIVE

## 2014-04-18 LAB — RETICULOCYTES
RBC.: 3.11 MIL/uL — ABNORMAL LOW (ref 4.22–5.81)
RETIC CT PCT: 4.9 % — AB (ref 0.4–3.1)
Retic Count, Absolute: 152.4 10*3/uL (ref 19.0–186.0)

## 2014-04-18 LAB — LACTATE DEHYDROGENASE: LDH: 280 U/L — ABNORMAL HIGH (ref 94–250)

## 2014-04-18 MED ORDER — TAMSULOSIN HCL 0.4 MG PO CAPS: 0.4000 mg | ORAL_CAPSULE | Freq: Every day | ORAL | Status: DC

## 2014-04-18 MED ORDER — ACD FORMULA A 0.73-2.45-2.2 GM/100ML VI SOLN: 500.0000 mL | Status: DC

## 2014-04-18 MED ORDER — POTASSIUM CHLORIDE CRYS ER 20 MEQ PO TBCR
40.0000 meq | EXTENDED_RELEASE_TABLET | Freq: Every day | ORAL | Status: DC
  Administered 2014-04-18 – 2014-04-22 (×5): 40 meq via ORAL
  Filled 2014-04-18 (×6): qty 2

## 2014-04-18 MED ORDER — CALCIUM CARBONATE ANTACID 500 MG PO CHEW
CHEWABLE_TABLET | ORAL | Status: AC
  Administered 2014-04-18: 400 mg via ORAL
  Filled 2014-04-18: qty 2

## 2014-04-18 MED ORDER — SODIUM CHLORIDE 0.9 % IV SOLN
4.0000 g | Freq: Once | INTRAVENOUS | Status: DC
  Filled 2014-04-18: qty 40

## 2014-04-18 MED ORDER — DIPHENHYDRAMINE HCL 25 MG PO CAPS
ORAL_CAPSULE | ORAL | Status: AC
  Administered 2014-04-18: 25 mg via ORAL
  Filled 2014-04-18: qty 1

## 2014-04-18 MED ORDER — CALCIUM CARBONATE ANTACID 500 MG PO CHEW
2.0000 | CHEWABLE_TABLET | ORAL | Status: AC
  Administered 2014-04-18 (×2): 400 mg via ORAL
  Filled 2014-04-18: qty 2

## 2014-04-18 MED ORDER — ACD FORMULA A 0.73-2.45-2.2 GM/100ML VI SOLN
Status: AC
  Filled 2014-04-18: qty 500

## 2014-04-18 MED ORDER — DIPHENHYDRAMINE HCL 25 MG PO CAPS
ORAL_CAPSULE | ORAL | Status: AC
  Filled 2014-04-18: qty 1

## 2014-04-18 MED ORDER — DIPHENHYDRAMINE HCL 25 MG PO CAPS: 25.0000 mg | ORAL_CAPSULE | Freq: Four times a day (QID) | ORAL | Status: DC | PRN

## 2014-04-18 MED ORDER — ANTICOAGULANT SODIUM CITRATE 4% (200MG/5ML) IV SOLN
5.0000 mL | Freq: Once | Status: DC
  Filled 2014-04-18: qty 250

## 2014-04-18 MED ORDER — ACETAMINOPHEN 325 MG PO TABS: 650.0000 mg | ORAL_TABLET | ORAL | Status: DC | PRN

## 2014-04-18 MED ORDER — CALCIUM CARBONATE ANTACID 500 MG PO CHEW
CHEWABLE_TABLET | ORAL | Status: AC
  Administered 2014-04-18: 200 mg
  Filled 2014-04-18: qty 2

## 2014-04-18 NOTE — Progress Notes (Signed)
Scott Waters is still doing okay. He is getting his third exchange today. He has tolerated these so far without any problems. He's had no fever. He's had no bleeding. He's had no headache. He's had a little nausea but no vomiting.  His platelet count is 22,000. His LDH is down to 280. His reticulocyte count, corrected is about 3.5. He still has good renal function.  He's had no fever. His vital signs are stable. On his physical exam, there is no neurological findings. He has some slight swelling in his legs. There is decreased petechia on his legs. Lungs are clear. Cardiac exam regular rate and rhythm. Abdomen is soft. There is no palpable liver or spleen tip.  We will continue his platelets changes for right now. He is tolerating these well. We will see how his platelet count response.  I did send off the ADAMTS-13 assay. I am not sure when this will come back.  [name] 1:5-7

## 2014-04-18 NOTE — Progress Notes (Signed)
PATIENT DETAILS Name: Scott Waters Age: 58 y.o. 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 Subsequently IR consulted for Dialysis catheter placement, and patient underwent Plasmapharesis. Initially started on Steroids and IVIG-which have been discontinued. Platelet count now rebounding.  Epistaxis -resolved -secondary to thrombocytopenia  Melena -resolved-likely secondary to swallowed blood from Epistaxis. Do not think this is a GI bleed at this time. Continue PPI, and follow clinical course. Admitting MD d/w GI on call-unable to perform endoscopic procedures given severe thrombytopenia. Hb stable.  Anemia/Thrombocytopenia -at this time, this is likely secondary to TTP. Platelets improving   HTN -stable with Amlodipine  Disposition: Remain inpatient  DVT Prophylaxis: TED Hose  Code Status: Full code   Family Communication None at bedside  Procedures:  None  CONSULTS:  hematology/oncology and IR  MEDICATIONS: Scheduled Meds: . amLODipine  5 mg Oral Daily  . antiseptic oral rinse  15 mL Mouth Rinse q12n4p  . chlorhexidine  15 mL Mouth Rinse BID  . citrate dextrose      . citrate dextrose      . diphenhydrAMINE      . folic acid  1 mg Oral Daily  . pantoprazole  40 mg Oral BID  . sodium chloride  10-40 mL Intracatheter Q12H  . sodium chloride  3 mL Intravenous Q12H  . thiamine  100 mg Oral Daily   Or  . thiamine  100 mg Intravenous Daily   Continuous Infusions:  PRN Meds:.acetaminophen, fluticasone, LORazepam, LORazepam, sodium chloride  Antibiotics: Anti-infectives   None       PHYSICAL EXAM: Vital signs in last 24 hours: Filed Vitals:   04/18/14 1214 04/18/14 1219 04/18/14 1224 04/18/14 1231  BP: 138/70 139/80 138/79 140/81  Pulse: 84 92 88 89  Temp: 98.1 F (36.7 C) 98.3 F (36.8 C) 98.1 F (36.7 C) 98.1 F (36.7 C)  TempSrc: Oral Oral Oral Oral  Resp: 15 16 21 25   Height:      Weight:      SpO2: 96% 95% 99% 100%    Weight change: -0.729 kg (-1 lb 9.7 oz) Filed Weights   04/17/14 0500 04/18/14 0550 04/18/14 1020  Weight: 92.9 kg (204 lb 12.9 oz) 92.171 kg (203 lb 3.2 oz) 92.1 kg (203 lb 0.7 oz)   Body mass index is 30.88 kg/(m^2).   Gen Exam: Awake and alert with clear speech.   Neck: Supple, No JVD.   Chest: B/L Clear.   CVS: S1 S2 Regular, no murmurs.  Abdomen: soft, BS +, non tender, non distended.  Extremities: no edema, lower extremities warm to touch. Neurologic: Non Focal.   Skin: Mild bruising in the left lower leg-no other purpuric sites seen.  Wounds: N/A.    Intake/Output from previous day:  Intake/Output Summary (Last 24 hours) at 04/18/14 1320 Last data filed at 04/18/14 0915  Gross per 24 hour  Intake    243 ml  Output      0 ml  Net    243 ml     LAB RESULTS: CBC  Recent Labs Lab 04/15/14 0956 04/15/14 1700 04/16/14 0428 04/17/14 0650 04/18/14 0500 04/18/14 1148  WBC 6.4 7.4 6.9 19.3* 13.9* 14.6*  HGB 10.9* 10.6* 9.6* 9.3*  9.1* 9.8*  HCT 32.6* 31.2* 29.5* 27.2* 28.2* 30.4*  PLT <20.0 Repeated and verified X2.* 15* 3* 18* 22* 26*  MCV 89.7 88.4 89.9 88.6 90.7 91.0  MCH  --  30.0 29.3 30.3 29.3 29.3  MCHC 33.4 34.0 32.5 34.2 32.3 32.2  RDW 15.9* 15.5 15.5 15.7* 15.7* 15.4  LYMPHSABS 1.0  --   --   --   --   --   MONOABS 0.5  --   --   --   --   --   EOSABS 0.2  --   --   --   --   --   BASOSABS 0.0  --   --   --   --   --     Chemistries   Recent Labs Lab 04/15/14 1700 04/16/14 0428 04/17/14 0650 04/18/14 0500 04/18/14 1148  NA 141 134* 139 144 142  K 3.4* 3.5* 3.5* 3.2* 3.0*  CL 105 100 101 100 100  CO2 22 24 26  36* 32  GLUCOSE 124* 172* 147* 100* 137*    BUN 21 15 22 16 16   CREATININE 1.09 0.96 0.98 0.97 0.96  CALCIUM 8.9 8.4 8.8 9.2 9.1    CBG: No results found for this basename: GLUCAP,  in the last 168 hours  GFR Estimated Creatinine Clearance: 90.2 ml/min (by C-G formula based on Cr of 0.96).  Coagulation profile  Recent Labs Lab 04/15/14 0956 04/16/14 0428  INR 1.0 1.07    Cardiac Enzymes No results found for this basename: CK, CKMB, TROPONINI, MYOGLOBIN,  in the last 168 hours  No components found with this basename: POCBNP,   Recent Labs  04/15/14 1920  DDIMER 1.41*   No results found for this basename: HGBA1C,  in the last 72 hours No results found for this basename: CHOL, HDL, LDLCALC, TRIG, CHOLHDL, LDLDIRECT,  in the last 72 hours No results found for this basename: TSH, T4TOTAL, FREET3, T3FREE, THYROIDAB,  in the last 72 hours  Recent Labs  04/15/14 2250  04/17/14 0650 04/18/14 0500  VITAMINB12 326  --   --   --   FOLATE >20.0  --   --   --   RETICCTPCT  --   < > 4.6* 4.9*  < > = values in this interval not displayed. No results found for this basename: LIPASE, AMYLASE,  in the last 72 hours  Urine Studies No results found for this basename: UACOL, UAPR, USPG, UPH, UTP, UGL, UKET, UBIL, UHGB, UNIT, UROB, ULEU, UEPI, UWBC, URBC, UBAC, CAST, CRYS, UCOM, BILUA,  in the last 72 hours  MICROBIOLOGY: No results found for this or any previous visit (from the past 240 hour(s)).  RADIOLOGY STUDIES/RESULTS: No results found.  Jonetta Osgood, MD  [facility] Pager:[phone]  If 7PM-7AM, please contact night-coverage [email or web address] Password [email or web address] 04/18/2014, 1:20 PM   LOS: 3 days   **Disclaimer: This note may have been dictated with voice recognition software. Similar sounding words can inadvertently be transcribed and this note may contain transcription errors which may not have been corrected upon publication of note.**

## 2014-04-19 ENCOUNTER — Telehealth: Payer: Self-pay | Admitting: Internal Medicine

## 2014-04-19 LAB — POCT I-STAT, CHEM 8
BUN: 15 mg/dL (ref 6–23)
CALCIUM ION: 0.94 mmol/L — AB (ref 1.13–1.30)
CREATININE: 1.2 mg/dL (ref 0.50–1.35)
Chloride: 95 mEq/L — ABNORMAL LOW (ref 96–112)
Glucose, Bld: 141 mg/dL — ABNORMAL HIGH (ref 70–99)
HCT: 29 % — ABNORMAL LOW (ref 39.0–52.0)
Hemoglobin: 9.9 g/dL — ABNORMAL LOW (ref 13.0–17.0)
Potassium: 3.6 mEq/L — ABNORMAL LOW (ref 3.7–5.3)
Sodium: 140 mEq/L (ref 137–147)
TCO2: 27 mmol/L (ref 0–100)

## 2014-04-19 LAB — THERAPEUTIC PLASMA EXCHANGE (BLOOD BANK)
Plasma Exchange: 3506
Plasma volume needed: 3500
Unit division: 0
Unit division: 0
Unit division: 0
Unit division: 0
Unit division: 0
Unit division: 0
Unit division: 0
Unit division: 0
Unit division: 0
Unit division: 0
Unit division: 0
Unit division: 0

## 2014-04-19 LAB — BASIC METABOLIC PANEL
BUN: 18 mg/dL (ref 6–23)
CO2: 32 mEq/L (ref 19–32)
CREATININE: 0.95 mg/dL (ref 0.50–1.35)
Calcium: 8.6 mg/dL (ref 8.4–10.5)
Chloride: 101 mEq/L (ref 96–112)
GFR calc Af Amer: >90 mL/min (ref >90)
GFR, EST NON AFRICAN AMERICAN: 89 mL/min — AB (ref >90)
Glucose, Bld: 103 mg/dL — ABNORMAL HIGH (ref 70–99)
Potassium: 3.5 mEq/L — ABNORMAL LOW (ref 3.7–5.3)
Sodium: 141 mEq/L (ref 137–147)

## 2014-04-19 LAB — CBC
HCT: 28.4 % — ABNORMAL LOW (ref 39.0–52.0)
Hemoglobin: 9.3 g/dL — ABNORMAL LOW (ref 13.0–17.0)
MCH: 29.6 pg (ref 26.0–34.0)
MCHC: 32.7 g/dL (ref 30.0–36.0)
MCV: 90.4 fL (ref 78.0–100.0)
Platelets: 16 10*3/uL — CL (ref 150–400)
RBC: 3.14 MIL/uL — ABNORMAL LOW (ref 4.22–5.81)
RDW: 15.4 % (ref 11.5–15.5)
WBC: 8.9 10*3/uL (ref 4.0–10.5)

## 2014-04-19 LAB — RETICULOCYTES
RBC.: 3.14 MIL/uL — ABNORMAL LOW (ref 4.22–5.81)
RETIC CT PCT: 5 % — AB (ref 0.4–3.1)
Retic Count, Absolute: 157 10*3/uL (ref 19.0–186.0)

## 2014-04-19 LAB — LACTATE DEHYDROGENASE: LDH: 230 U/L (ref 94–250)

## 2014-04-19 LAB — PATHOLOGIST SMEAR REVIEW

## 2014-04-19 MED ORDER — SODIUM CHLORIDE 0.9 % IV SOLN
4.0000 g | Freq: Once | INTRAVENOUS | Status: AC
  Administered 2014-04-19: 4 g via INTRAVENOUS
  Filled 2014-04-19: qty 40

## 2014-04-19 MED ORDER — ANTICOAGULANT SODIUM CITRATE 4% (200MG/5ML) IV SOLN
5.0000 mL | Freq: Once | Status: AC
  Administered 2014-04-19: 2.8 mL
  Filled 2014-04-19: qty 250

## 2014-04-19 MED ORDER — CALCIUM CARBONATE ANTACID 500 MG PO CHEW
CHEWABLE_TABLET | ORAL | Status: AC
  Filled 2014-04-19: qty 2

## 2014-04-19 MED ORDER — DEXAMETHASONE SODIUM PHOSPHATE 10 MG/ML IJ SOLN
40.0000 mg | INTRAMUSCULAR | Status: DC
  Administered 2014-04-19: 40 mg via INTRAVENOUS
  Filled 2014-04-19 (×2): qty 4

## 2014-04-19 MED ORDER — DIPHENHYDRAMINE HCL 25 MG PO CAPS
ORAL_CAPSULE | ORAL | Status: AC
  Administered 2014-04-19: 25 mg via ORAL
  Filled 2014-04-19: qty 1

## 2014-04-19 MED ORDER — DEXAMETHASONE SODIUM PHOSPHATE 4 MG/ML IJ SOLN
40.0000 mg | INTRAMUSCULAR | Status: DC
  Filled 2014-04-19 (×2): qty 10

## 2014-04-19 MED ORDER — ACETAMINOPHEN 325 MG PO TABS: 650.0000 mg | ORAL_TABLET | ORAL | Status: DC | PRN

## 2014-04-19 MED ORDER — ACD FORMULA A 0.73-2.45-2.2 GM/100ML VI SOLN
Status: AC
  Filled 2014-04-19: qty 500

## 2014-04-19 MED ORDER — CALCIUM CARBONATE ANTACID 500 MG PO CHEW
2.0000 | CHEWABLE_TABLET | ORAL | Status: AC
Start: 2014-04-19 — End: 2014-04-19
  Administered 2014-04-19 (×2): 400 mg via ORAL
  Filled 2014-04-19: qty 2

## 2014-04-19 MED ORDER — CALCIUM CARBONATE ANTACID 500 MG PO CHEW
CHEWABLE_TABLET | ORAL | Status: AC
  Administered 2014-04-19: 400 mg via ORAL
  Filled 2014-04-19: qty 2

## 2014-04-19 MED ORDER — PANTOPRAZOLE SODIUM 40 MG PO TBEC
40.0000 mg | DELAYED_RELEASE_TABLET | Freq: Every day | ORAL | Status: DC
  Administered 2014-04-20 – 2014-04-22 (×3): 40 mg via ORAL
  Filled 2014-04-19 (×4): qty 1

## 2014-04-19 MED ORDER — ACD FORMULA A 0.73-2.45-2.2 GM/100ML VI SOLN
500.0000 mL | Status: DC
  Filled 2014-04-19: qty 500

## 2014-04-19 MED ORDER — DIPHENHYDRAMINE HCL 25 MG PO CAPS
25.0000 mg | ORAL_CAPSULE | Freq: Four times a day (QID) | ORAL | Status: DC | PRN
Start: 2014-04-19 — End: 2014-04-20
  Administered 2014-04-19: 25 mg via ORAL

## 2014-04-19 NOTE — Progress Notes (Signed)
CRITICAL VALUE ALERT  Critical value received:  Platelets 16  Date of notification:  04/19/2014  Time of notification:  0639  Critical value read back:yes  Nurse who received alert:  Aamya Orellana, RN  MD notified (1st page):  Schorr, NP  Time of first page:  [phone]  MD notified (2nd page):  Time of second page:  Responding MD:    Time MD responded:    Notified NP that pt to receive plasmapheresis today. No new orders given.

## 2014-04-19 NOTE — Progress Notes (Signed)
Scott Waters has a tolerated plasma exchange okay. Unfortunately,, his platelet count has come back down a little bit.. I am not sure exactly what this is indicative of. His LDH has kept coming down. It is now 230.  His reticulocyte count still slightly elevated.  We did do a direct Coombs test. This was negative.  He says his legs are doing better. Says ecchymoses or no better.  He's had a little nausea but otherwise has tolerated the plasma exchange okay. He's had no bleeding. He's had no fever. He's had no problems with bowels or bladder. Is up has been doing okay. He's had no cough or shortness of breath.  His vital signs are all stable. His blood pressure is 151/76. He is afebrile. Lungs are clear. Cardiac exam regular rate and rhythm with no murmurs rubs or bruits. Abdomen is soft. Has good bowel sounds. There is no fluid wave. There is no palpable liver or spleen tip. Back exam no tenderness over the spine ribs or hips. Extremities shows no clubbing cyanosis or edema. Neurological exam is nonfocal. Skin exam shows some scattered ecchymoses.  Again, his platelet count has come down a little bit. I think we just have to be persistent and continue with the plasma exchange. I will re\re add Decadron. I do not see a problem with doing this.  He is asymptomatic.  I will check a haptoglobin level on him again.  I am not sure when his ADAMTS-13 assay will return.

## 2014-04-19 NOTE — Telephone Encounter (Signed)
Relevant patient education mailed to patient.  

## 2014-04-19 NOTE — Progress Notes (Signed)
PATIENT DETAILS Name: Scott Waters Age: 58 y.o. 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 Subsequently IR consulted for Dialysis catheter placement, and patient underwent Plasmapharesis. Initially started on Steroids and IVIG-which have been discontinued. Platelet count slowly improving, but has decreased down to 16 K today. Await ADAMTS13 level, and await further Hematology recommendations.   Epistaxis -resolved -secondary to thrombocytopenia  Melena -resolved-likely secondary to swallowed blood from Epistaxis. Do not think this is a GI bleed at this time. Continue PPI, and follow clinical course. Admitting MD d/w GI on call-unable to perform endoscopic procedures given severe thrombytopenia. Hb stable.  Anemia/Thrombocytopenia -at this time, this is likely secondary to TTP. Platelets improving   HTN -stable with Amlodipine  Disposition: Remain inpatient  DVT Prophylaxis: TED Hose  Code Status: Full code   Family Communication None at bedside  Procedures:  None  CONSULTS:  hematology/oncology and IR  MEDICATIONS: Scheduled Meds: . amLODipine  5 mg Oral Daily  . anticoagulant sodium citrate  5 mL Intracatheter Once  . antiseptic oral rinse  15 mL Mouth Rinse q12n4p  . calcium carbonate  2 tablet Oral Q3H  . calcium gluconate IVPB  4 g Intravenous Once  . chlorhexidine  15 mL Mouth Rinse BID  . citrate dextrose      . folic acid  1 mg Oral Daily  . pantoprazole  40 mg Oral BID  . potassium chloride  40 mEq Oral Daily  . sodium chloride  10-40 mL Intracatheter Q12H  . sodium chloride  3 mL Intravenous Q12H  . thiamine  100 mg Oral Daily   Or  .  thiamine  100 mg Intravenous Daily   Continuous Infusions: . citrate dextrose     PRN Meds:.acetaminophen, acetaminophen, diphenhydrAMINE, fluticasone, sodium chloride  Antibiotics: Anti-infectives   None       PHYSICAL EXAM: Vital signs in last 24 hours: Filed Vitals:   04/19/14 1040 04/19/14 1107 04/19/14 1117 04/19/14 1129  BP: 151/76 137/82 140/72 135/81  Pulse: 80 81 77 80  Temp: 98.1 F (36.7 C) 98.3 F (36.8 C) 98.3 F (36.8 C) 98.2 F (36.8 C)  TempSrc: Oral Oral Oral Oral  Resp: 25 18 18 20   Height:      Weight:      SpO2: 99% 100% 100% 98%    Weight change: -0.071 kg (-2.5 oz) Filed Weights   04/18/14 0550 04/18/14 1020 04/19/14 0522  Weight: 92.171 kg (203 lb 3.2 oz) 92.1 kg (203 lb 0.7 oz) 91.763 kg (202 lb 4.8 oz)   Body mass index is 30.77 kg/(m^2).   Gen Exam: Awake and alert with clear speech.   Neck: Supple, No JVD.   Chest: B/L Clear.   CVS: S1 S2 Regular, no murmurs.  Abdomen: soft, BS +, non tender, non distended.  Extremities: no edema, lower extremities warm to touch. Neurologic: Non Focal.   Skin: Mild bruising in the left lower leg and left upper thigh-no other purpuric sites seen.  Wounds: N/A.    Intake/Output from previous day:  Intake/Output Summary (Last 24 hours) at 04/19/14 1135 Last data filed at 04/19/14 1017  Gross per 24 hour  Intake      3 ml  Output  0 ml  Net      3 ml     LAB RESULTS: CBC  Recent Labs Lab 04/15/14 0956  04/16/14 0428 04/17/14 0650 04/18/14 0500 04/18/14 1110 04/18/14 1148 04/19/14 0520  WBC 6.4  < > 6.9 19.3* 13.9*  --  14.6* 8.9  HGB 10.9*  < > 9.6* 9.3* 9.1* 12.6* 9.8* 9.3*  HCT 32.6*  < > 29.5* 27.2* 28.2* 37.0* 30.4* 28.4*  PLT <20.0 Repeated and verified X2.*  < > 3* 18* 22*  --  26* 16*  MCV 89.7  < > 89.9 88.6 90.7  --  91.0 90.4  MCH  --   < > 29.3 30.3 29.3  --  29.3 29.6  MCHC 33.4  < > 32.5 34.2 32.3  --  32.2 32.7  RDW 15.9*  < > 15.5 15.7* 15.7*  --  15.4 15.4    LYMPHSABS 1.0  --   --   --   --   --   --   --   MONOABS 0.5  --   --   --   --   --   --   --   EOSABS 0.2  --   --   --   --   --   --   --   BASOSABS 0.0  --   --   --   --   --   --   --   < > = values in this interval not displayed.  Chemistries   Recent Labs Lab 04/16/14 0428 04/17/14 0650 04/18/14 0500 04/18/14 1110 04/18/14 1148 04/19/14 0520  NA 134* 139 144 143 142 141  K 3.5* 3.5* 3.2* 2.9* 3.0* 3.5*  CL 100 101 100 95* 100 101  CO2 24 26 36*  --  32 32  GLUCOSE 172* 147* 100* 141* 137* 103*  BUN 15 22 16 15 16 18   CREATININE 0.96 0.98 0.97 1.10 0.96 0.95  CALCIUM 8.4 8.8 9.2  --  9.1 8.6    CBG: No results found for this basename: GLUCAP,  in the last 168 hours  GFR Estimated Creatinine Clearance: 91 ml/min (by C-G formula based on Cr of 0.95).  Coagulation profile  Recent Labs Lab 04/15/14 0956 04/16/14 0428  INR 1.0 1.07    Cardiac Enzymes No results found for this basename: CK, CKMB, TROPONINI, MYOGLOBIN,  in the last 168 hours  No components found with this basename: POCBNP,  No results found for this basename: DDIMER,  in the last 72 hours No results found for this basename: HGBA1C,  in the last 72 hours No results found for this basename: CHOL, HDL, LDLCALC, TRIG, CHOLHDL, LDLDIRECT,  in the last 72 hours No results found for this basename: TSH, T4TOTAL, FREET3, T3FREE, THYROIDAB,  in the last 72 hours  Recent Labs  04/18/14 0500 04/19/14 0520  RETICCTPCT 4.9* 5.0*   No results found for this basename: LIPASE, AMYLASE,  in the last 72 hours  Urine Studies No results found for this basename: UACOL, UAPR, USPG, UPH, UTP, UGL, UKET, UBIL, UHGB, UNIT, UROB, ULEU, UEPI, UWBC, URBC, UBAC, CAST, CRYS, UCOM, BILUA,  in the last 72 hours  MICROBIOLOGY: No results found for this or any previous visit (from the past 240 hour(s)).  RADIOLOGY STUDIES/RESULTS: No results found.  Jonetta Osgood, MD  [facility] Pager:[phone]  [phone]  If 7PM-7AM, please contact night-coverage [email or web address] Password [email or web address] 04/19/2014, 11:35 AM   LOS: 4 days   **Disclaimer:  This note may have been dictated with voice recognition software. Similar sounding words can inadvertently be transcribed and this note may contain transcription errors which may not have been corrected upon publication of note.**

## 2014-04-20 LAB — THERAPEUTIC PLASMA EXCHANGE (BLOOD BANK)
Plasma volume needed: 3500
UNIT DIVISION: 0
UNIT DIVISION: 0
UNIT DIVISION: 0
UNIT DIVISION: 0
UNIT DIVISION: 0
Unit division: 0
Unit division: 0
Unit division: 0
Unit division: 0
Unit division: 0
Unit division: 0

## 2014-04-20 LAB — RETICULOCYTES
RBC.: 3.09 MIL/uL — ABNORMAL LOW (ref 4.22–5.81)
Retic Count, Absolute: 151.4 10*3/uL (ref 19.0–186.0)
Retic Ct Pct: 4.9 % — ABNORMAL HIGH (ref 0.4–3.1)

## 2014-04-20 LAB — POCT I-STAT, CHEM 8
BUN: 17 mg/dL (ref 6–23)
Calcium, Ion: 1.11 mmol/L — ABNORMAL LOW (ref 1.13–1.30)
Chloride: 97 mEq/L (ref 96–112)
Creatinine, Ser: 1.1 mg/dL (ref 0.50–1.35)
Glucose, Bld: 171 mg/dL — ABNORMAL HIGH (ref 70–99)
HEMATOCRIT: 27 % — AB (ref 39.0–52.0)
HEMOGLOBIN: 9.2 g/dL — AB (ref 13.0–17.0)
POTASSIUM: 3.7 meq/L (ref 3.7–5.3)
Sodium: 139 mEq/L (ref 137–147)
TCO2: 24 mmol/L (ref 0–100)

## 2014-04-20 LAB — HAPTOGLOBIN: Haptoglobin: 84 mg/dL (ref 45–215)

## 2014-04-20 LAB — ADAMTS13 ACTIVITY: Adamts 13 Activity: 99 % Activity (ref 68–163)

## 2014-04-20 LAB — LACTATE DEHYDROGENASE: LDH: 282 U/L — ABNORMAL HIGH (ref 94–250)

## 2014-04-20 LAB — CBC
HEMATOCRIT: 27.5 % — AB (ref 39.0–52.0)
HEMOGLOBIN: 8.9 g/dL — AB (ref 13.0–17.0)
MCH: 28.8 pg (ref 26.0–34.0)
MCHC: 32.4 g/dL (ref 30.0–36.0)
MCV: 89 fL (ref 78.0–100.0)
Platelets: 23 10*3/uL — CL (ref 150–400)
RBC: 3.09 MIL/uL — ABNORMAL LOW (ref 4.22–5.81)
RDW: 14.9 % (ref 11.5–15.5)
WBC: 12.4 10*3/uL — ABNORMAL HIGH (ref 4.0–10.5)

## 2014-04-20 LAB — SAVE SMEAR

## 2014-04-20 MED ORDER — DIPHENHYDRAMINE HCL 25 MG PO CAPS: 25.0000 mg | ORAL_CAPSULE | Freq: Four times a day (QID) | ORAL | Status: DC | PRN

## 2014-04-20 MED ORDER — ACD FORMULA A 0.73-2.45-2.2 GM/100ML VI SOLN
Status: AC
  Filled 2014-04-20: qty 500

## 2014-04-20 MED ORDER — SODIUM CHLORIDE 0.9 % IV SOLN
40.0000 mg | Freq: Once | INTRAVENOUS | Status: AC
  Administered 2014-04-20: 40 mg via INTRAVENOUS
  Filled 2014-04-20 (×2): qty 4

## 2014-04-20 MED ORDER — SODIUM CHLORIDE 0.9 % IV SOLN
4.0000 g | Freq: Once | INTRAVENOUS | Status: AC
  Administered 2014-04-20: 4 g via INTRAVENOUS
  Filled 2014-04-20 (×2): qty 40

## 2014-04-20 MED ORDER — ACD FORMULA A 0.73-2.45-2.2 GM/100ML VI SOLN
Status: AC
  Administered 2014-04-20: 12:00:00
  Filled 2014-04-20: qty 500

## 2014-04-20 MED ORDER — DIPHENHYDRAMINE HCL 25 MG PO CAPS
ORAL_CAPSULE | ORAL | Status: AC
  Administered 2014-04-20: 25 mg
  Filled 2014-04-20: qty 1

## 2014-04-20 MED ORDER — ACETAMINOPHEN 325 MG PO TABS: 650.0000 mg | ORAL_TABLET | ORAL | Status: DC | PRN

## 2014-04-20 MED ORDER — CALCIUM CARBONATE ANTACID 500 MG PO CHEW
CHEWABLE_TABLET | ORAL | Status: AC
  Administered 2014-04-21: 500 mg
  Filled 2014-04-20: qty 2

## 2014-04-20 MED ORDER — FOLIC ACID 1 MG PO TABS
2.0000 mg | ORAL_TABLET | Freq: Every day | ORAL | Status: DC
  Administered 2014-04-20 – 2014-04-22 (×3): 2 mg via ORAL
  Filled 2014-04-20 (×3): qty 2

## 2014-04-20 MED ORDER — CALCIUM CARBONATE ANTACID 500 MG PO CHEW
2.0000 | CHEWABLE_TABLET | ORAL | Status: DC
  Administered 2014-04-20: 400 mg via ORAL
  Filled 2014-04-20 (×2): qty 2

## 2014-04-20 MED ORDER — ACD FORMULA A 0.73-2.45-2.2 GM/100ML VI SOLN
500.0000 mL | Status: DC
  Administered 2014-04-20: 500 mL via INTRAVENOUS

## 2014-04-20 MED ORDER — ANTICOAGULANT SODIUM CITRATE 4% (200MG/5ML) IV SOLN
5.0000 mL | Freq: Once | Status: AC
  Administered 2014-04-20: 5 mL
  Filled 2014-04-20 (×2): qty 250

## 2014-04-20 NOTE — Progress Notes (Signed)
PATIENT DETAILS Name: Scott Waters Age: 58 y.o. Sex: male Date of Birth: [birth year] Admit Date: 04/15/2014 Admitting Physician Berle Mull, MD [id number] KOTVAN, MD  Subjective Feels well.  Has been walking.  No complaints.  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 Subsequently IR consulted for Dialysis catheter placement, and patient underwent Plasmapharesis. Initially started on IVIG-which has been discontinued. Platelet count slowly improving.. Await ADAMTS13 level, and await further Hematology recommendations.   Epistaxis -resolved -secondary to thrombocytopenia  Melena -resolved-likely secondary to swallowed blood from Epistaxis. Do not think this is a GI bleed at this time. Continue PPI, and follow clinical course. Admitting MD d/w GI on call-unable to perform endoscopic procedures given severe thrombytopenia. Hb stable.  Anemia/Thrombocytopenia -at this time, this is likely secondary to TTP. Platelets improving  -Hgb slowly trending down (5/27).  No frank bleeding or buising.  HTN -stable with Amlodipine  Disposition: Remain inpatient  DVT Prophylaxis: TED Hose  Code Status: Full code   Family Communication None at bedside  Procedures:  None  CONSULTS:  hematology/oncology and IR  MEDICATIONS: Scheduled Meds: . amLODipine  5 mg Oral Daily  . anticoagulant sodium citrate  5 mL Intracatheter Once  . antiseptic oral rinse  15 mL Mouth Rinse q12n4p  . calcium carbonate      . calcium carbonate  2 tablet Oral Q3H  . calcium gluconate IVPB  4 g Intravenous Once  . chlorhexidine  15 mL Mouth Rinse BID  . citrate dextrose      . dexamethasone (DECADRON) IVPB  40 mg Intravenous Once  . diphenhydrAMINE      . folic acid  2 mg Oral Daily  . pantoprazole  40 mg Oral Daily  . potassium chloride  40 mEq Oral  Daily  . sodium chloride  10-40 mL Intracatheter Q12H  . sodium chloride  3 mL Intravenous Q12H  . thiamine  100 mg Oral Daily   Continuous Infusions: . citrate dextrose     PRN Meds:.acetaminophen, acetaminophen, diphenhydrAMINE, fluticasone, sodium chloride  Antibiotics: Anti-infectives   None       PHYSICAL EXAM: Vital signs in last 24 hours: Filed Vitals:   04/19/14 1240 04/19/14 2100 04/20/14 0500 04/20/14 0503  BP: 185/91 153/83  131/80  Pulse: 87 89  70  Temp: 97.5 F (36.4 C) 98.2 F (36.8 C)  97.8 F (36.6 C)  TempSrc: Axillary Oral  Oral  Resp: 18 18  18   Height:      Weight:   92 kg (202 lb 13.2 oz) 92.6 kg (204 lb 2.3 oz)  SpO2: 100% 100%  97%    Weight change: -0.1 kg (-3.5 oz) Filed Weights   04/19/14 0522 04/20/14 0500 04/20/14 0503  Weight: 91.763 kg (202 lb 4.8 oz) 92 kg (202 lb 13.2 oz) 92.6 kg (204 lb 2.3 oz)   Body mass index is 31.05 kg/(m^2).   Gen Exam: Awake and alert with clear speech.  Pleasant.  Well appearing. Neck: Supple, No JVD.   Chest: B/L Clear.   CVS: S1 S2 Regular, no murmurs.  Abdomen: soft, BS +, non tender, non distended.  Extremities: no edema, lower extremities warm to touch. Neurologic: Non Focal.   Skin: Mild bruising in the left lower leg and left upper thigh-no other purpuric sites seen.  Wounds: N/A.    Intake/Output from previous day:  Intake/Output  Summary (Last 24 hours) at 04/20/14 0929 Last data filed at 04/19/14 1017  Gross per 24 hour  Intake      3 ml  Output      0 ml  Net      3 ml     LAB RESULTS: CBC  Recent Labs Lab 04/15/14 0956  04/17/14 0650 04/18/14 0500 04/18/14 1110 04/18/14 1148 04/19/14 0520 04/19/14 1106 04/20/14 0700  WBC 6.4  < > 19.3* 13.9*  --  14.6* 8.9  --  12.4*  HGB 10.9*  < > 9.3* 9.1* 12.6* 9.8* 9.3* 9.9* 8.9*  HCT 32.6*  < > 27.2* 28.2* 37.0* 30.4* 28.4* 29.0* 27.5*  PLT <20.0 Repeated and verified X2.*  < > 18* 22*  --  26* 16*  --  23*  MCV 89.7  < > 88.6  90.7  --  91.0 90.4  --  89.0  MCH  --   < > 30.3 29.3  --  29.3 29.6  --  28.8  MCHC 33.4  < > 34.2 32.3  --  32.2 32.7  --  32.4  RDW 15.9*  < > 15.7* 15.7*  --  15.4 15.4  --  14.9  LYMPHSABS 1.0  --   --   --   --   --   --   --   --   MONOABS 0.5  --   --   --   --   --   --   --   --   EOSABS 0.2  --   --   --   --   --   --   --   --   BASOSABS 0.0  --   --   --   --   --   --   --   --   < > = values in this interval not displayed.  Chemistries   Recent Labs Lab 04/16/14 0428 04/17/14 0650 04/18/14 0500 04/18/14 1110 04/18/14 1148 04/19/14 0520 04/19/14 1106  NA 134* 139 144 143 142 141 140  K 3.5* 3.5* 3.2* 2.9* 3.0* 3.5* 3.6*  CL 100 101 100 95* 100 101 95*  CO2 24 26 36*  --  32 32  --   GLUCOSE 172* 147* 100* 141* 137* 103* 141*  BUN 15 22 16 15 16 18 15   CREATININE 0.96 0.98 0.97 1.10 0.96 0.95 1.20  CALCIUM 8.4 8.8 9.2  --  9.1 8.6  --      Coagulation profile  Recent Labs Lab 04/15/14 0956 04/16/14 0428  INR 1.0 1.07    Recent Labs  04/19/14 0520 04/20/14 0700  RETICCTPCT 5.0* 4.9*    RADIOLOGY STUDIES/RESULTS: No results found.  Karen Kitchens [facility] Pager:[phone]  If 7PM-7AM, please contact night-coverage [email or web address] Password [email or web address] 04/20/2014, 9:29 AM   LOS: 5 days   **Disclaimer: This note may have been dictated with voice recognition software. Similar sounding words can inadvertently be transcribed and this note may contain transcription errors which may not have been corrected upon publication of note.**

## 2014-04-20 NOTE — Progress Notes (Addendum)
Mr. Rembold still doing okay. His laboratories not yet back from this morning. He is still on plasma exchange. He has tolerated this well so far. The LDH has normalized. We will have to see what his haptoglobin might be. I am not sure when the ADAMTS-13 will be back. Hopefully it got sent off..  He's had no bleeding. He's ecchymoses or petechia have pretty much resolved. He's had no fever. There is no nausea vomiting.  His vital signs all stable. Blood pressure 131/80. His head and neck exam shows no ocular or oral lesions. No palatal or mucosal petechia are noted. Lungs are clear. Cardiac exam regular in rhythm. Abdomen is soft. There is no palpable liver spleen. Extremities shows no clubbing cyanosis or edema.  We will see how the platelet count today.  I still am not 100% convinced that he has TTP but I felt we had to initiate therapy for this given the lab studies that we had on Saturday. I would much rather be proactive and treat. If we find them the chance of him having TTP is decreasing, then we can stop plasma exchange and get him back on steroids. He currently is on Decadron.   Again, I would continue to do his plasma exchange today.  I really think that in the next day or so, we will have a good idea as to the chance of him having TTP.  Lum Keas  Psalm 69:29  ADDENDUM: The haptoglobin is up to 84.  retic count is corrected at 3.7%.  Platelets are 23K.  I still have to treat for TTP right now.

## 2014-04-20 NOTE — Progress Notes (Signed)
  I have directly reviewed the clinical findings, lab, imaging studies and management of this patient in detail. I have interviewed and examined the patient and agree with the documentation,  as recorded by the Physician extender.  Thurnell Lose M.D on 04/20/2014 at 2:23 PM  [facility] Office  [phone]

## 2014-04-21 ENCOUNTER — Inpatient Hospital Stay (HOSPITAL_COMMUNITY): Payer: BC Managed Care – PPO

## 2014-04-21 LAB — THERAPEUTIC PLASMA EXCHANGE (BLOOD BANK)
Plasma volume needed: 3500
UNIT DIVISION: 0
UNIT DIVISION: 0
UNIT DIVISION: 0
UNIT DIVISION: 0
UNIT DIVISION: 0
Unit division: 0
Unit division: 0
Unit division: 0
Unit division: 0
Unit division: 0
Unit division: 0
Unit division: 0

## 2014-04-21 LAB — RETICULOCYTES
RBC.: 3.03 MIL/uL — ABNORMAL LOW (ref 4.22–5.81)
Retic Count, Absolute: 163.6 10*3/uL (ref 19.0–186.0)
Retic Ct Pct: 5.4 % — ABNORMAL HIGH (ref 0.4–3.1)

## 2014-04-21 LAB — CBC
HEMATOCRIT: 27.1 % — AB (ref 39.0–52.0)
Hemoglobin: 9 g/dL — ABNORMAL LOW (ref 13.0–17.0)
MCH: 29.7 pg (ref 26.0–34.0)
MCHC: 33.2 g/dL (ref 30.0–36.0)
MCV: 89.4 fL (ref 78.0–100.0)
Platelets: 92 10*3/uL — ABNORMAL LOW (ref 150–400)
RBC: 3.03 MIL/uL — ABNORMAL LOW (ref 4.22–5.81)
RDW: 15.2 % (ref 11.5–15.5)
WBC: 14.8 10*3/uL — ABNORMAL HIGH (ref 4.0–10.5)

## 2014-04-21 LAB — POCT I-STAT, CHEM 8
BUN: 20 mg/dL (ref 6–23)
CHLORIDE: 98 meq/L (ref 96–112)
Calcium, Ion: 0.95 mmol/L — ABNORMAL LOW (ref 1.13–1.30)
Creatinine, Ser: 1.1 mg/dL (ref 0.50–1.35)
GLUCOSE: 163 mg/dL — AB (ref 70–99)
HEMATOCRIT: 29 % — AB (ref 39.0–52.0)
Hemoglobin: 9.9 g/dL — ABNORMAL LOW (ref 13.0–17.0)
Potassium: 4 mEq/L (ref 3.7–5.3)
Sodium: 140 mEq/L (ref 137–147)
TCO2: 24 mmol/L (ref 0–100)

## 2014-04-21 LAB — LACTATE DEHYDROGENASE: LDH: 288 U/L — ABNORMAL HIGH (ref 94–250)

## 2014-04-21 MED ORDER — ACETAMINOPHEN 325 MG PO TABS: 650.0000 mg | ORAL_TABLET | ORAL | Status: DC | PRN

## 2014-04-21 MED ORDER — ACD FORMULA A 0.73-2.45-2.2 GM/100ML VI SOLN
Status: AC
  Filled 2014-04-21: qty 500

## 2014-04-21 MED ORDER — SODIUM CHLORIDE 0.9 % IV SOLN
40.0000 mg | Freq: Once | INTRAVENOUS | Status: AC
  Administered 2014-04-21: 40 mg via INTRAVENOUS
  Filled 2014-04-21: qty 4

## 2014-04-21 MED ORDER — CALCIUM CARBONATE ANTACID 500 MG PO CHEW
2.0000 | CHEWABLE_TABLET | ORAL | Status: AC
  Administered 2014-04-21: 400 mg via ORAL
  Filled 2014-04-21 (×2): qty 2

## 2014-04-21 MED ORDER — DIPHENHYDRAMINE HCL 25 MG PO CAPS: 25.0000 mg | ORAL_CAPSULE | Freq: Four times a day (QID) | ORAL | Status: DC | PRN

## 2014-04-21 MED ORDER — SODIUM CHLORIDE 0.9 % IV SOLN
4.0000 g | Freq: Once | INTRAVENOUS | Status: AC
  Administered 2014-04-21: 4 g via INTRAVENOUS
  Filled 2014-04-21: qty 40

## 2014-04-21 MED ORDER — IOHEXOL 300 MG/ML  SOLN
25.0000 mL | INTRAMUSCULAR | Status: AC
  Administered 2014-04-21 (×2): 25 mL via ORAL

## 2014-04-21 MED ORDER — ANTICOAGULANT SODIUM CITRATE 4% (200MG/5ML) IV SOLN
5.0000 mL | Freq: Once | Status: AC
  Administered 2014-04-21: 5 mL
  Filled 2014-04-21: qty 250

## 2014-04-21 MED ORDER — DIPHENHYDRAMINE HCL 25 MG PO CAPS
ORAL_CAPSULE | ORAL | Status: AC
  Administered 2014-04-21: 25 mg
  Filled 2014-04-21: qty 1

## 2014-04-21 MED ORDER — ACD FORMULA A 0.73-2.45-2.2 GM/100ML VI SOLN
Status: AC
  Administered 2014-04-21: 16:00:00
  Filled 2014-04-21: qty 500

## 2014-04-21 MED ORDER — PREDNISONE 50 MG PO TABS
80.0000 mg | ORAL_TABLET | Freq: Every day | ORAL | Status: DC
  Administered 2014-04-22: 80 mg via ORAL
  Filled 2014-04-21 (×2): qty 1

## 2014-04-21 MED ORDER — ACD FORMULA A 0.73-2.45-2.2 GM/100ML VI SOLN
500.0000 mL | Status: DC
  Administered 2014-04-21: 500 mL via INTRAVENOUS
  Filled 2014-04-21: qty 500

## 2014-04-21 NOTE — Progress Notes (Signed)
PATIENT DETAILS Name: Scott Waters Age: 58 y.o. Sex: male Date of Birth: 07/31/1956 Admit Date: 04/15/2014 Admitting Physician Berle Mull, MD IRC:[id number] KOTVAN, MD  Subjective Still feels good. 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 Subsequently IR consulted for Dialysis catheter placement, and patient underwent Plasmapharesis. Initially started on IVIG-which has been discontinued. Platelet count slowly improving.  Increased significantly on 5/28. Will recheck CBC. ADAMTS13 level does not indicate TTP.   Per hematology, patient will undergo plasmapheresis on 5/28. He will likely be discharged time on 5/29 on prednisone.  Epistaxis -resolved -secondary to thrombocytopenia  Melena -resolved-likely secondary to swallowed blood from Epistaxis. No GI bleed at this time. Continue PPI, and follow clinical course. Admitting MD d/w GI on call-unable to perform endoscopic procedures given severe thrombytopenia. Hb stable at approximately 9.0.  Anemia/Thrombocytopenia -at this time, this is likely secondary to TTP. Platelets improving  -No frank bleeding or buising.  HTN -stable with Amlodipine  Disposition: Remain inpatient. Hopefully time 5/29  DVT Prophylaxis: TED Hose, patient ambulating  Code Status: Full code   Family Communication None at bedside  Procedures:  None  CONSULTS:  hematology/oncology and IR  MEDICATIONS: Scheduled Meds: . amLODipine  5 mg Oral Daily  . anticoagulant sodium citrate  5 mL Intracatheter Once  . antiseptic oral rinse  15 mL Mouth Rinse q12n4p  . calcium carbonate  2 tablet Oral Q3H  . calcium gluconate IVPB  4 g Intravenous Once  . chlorhexidine  15 mL Mouth Rinse BID  . citrate dextrose      . diphenhydrAMINE      . folic acid  2 mg Oral Daily  . pantoprazole  40 mg Oral Daily  .  potassium chloride  40 mEq Oral Daily  . sodium chloride  10-40 mL Intracatheter Q12H  . sodium chloride  3 mL Intravenous Q12H  . thiamine  100 mg Oral Daily   Continuous Infusions: . citrate dextrose     PRN Meds:.acetaminophen, acetaminophen, diphenhydrAMINE, fluticasone, sodium chloride  Antibiotics: Anti-infectives   None       PHYSICAL EXAM: Vital signs in last 24 hours: Filed Vitals:   04/21/14 0238 04/21/14 0500 04/21/14 0519 04/21/14 1004  BP: 153/68  143/84 162/94  Pulse: 59  74   Temp: 97.5 F (36.4 C)  98.4 F (36.9 C)   TempSrc: Oral  Oral   Resp: 18  18   Height:      Weight:  88.9 kg (195 lb 15.8 oz)    SpO2: 98%  98%     Weight change: 1.2 kg (2 lb 10.3 oz) Filed Weights   04/20/14 0945 04/20/14 1201 04/21/14 0500  Weight: 93.2 kg (205 lb 7.5 oz) 93.2 kg (205 lb 7.5 oz) 88.9 kg (195 lb 15.8 oz)   Body mass index is 29.81 kg/(m^2).   Gen Exam: Awake and alert with clear speech.  Pleasant.  Well appearing.  Unchanged physical exam Neck: Supple, No JVD.   Chest: B/L Clear.   CVS: S1 S2 Regular, no murmurs.  Abdomen: soft, BS +, non tender, non distended.  Extremities: no edema, lower extremities warm to touch.  TED hose in place Neurologic: Non Focal.   Skin: Mild bruising in the left lower leg and left upper thigh-no other purpuric sites seen.  Wounds: N/A.    Intake/Output from previous day:  Intake/Output  Summary (Last 24 hours) at 04/21/14 1306 Last data filed at 04/20/14 1900  Gross per 24 hour  Intake     54 ml  Output      0 ml  Net     54 ml     LAB RESULTS: CBC  Recent Labs Lab 04/15/14 0956  04/18/14 0500  04/18/14 1148 04/19/14 0520 04/19/14 1106 04/20/14 0700 04/20/14 0954 04/21/14 0530  WBC 6.4  < > 13.9*  --  14.6* 8.9  --  12.4*  --  14.8*  HGB 10.9*  < > 9.1*  < > 9.8* 9.3* 9.9* 8.9* 9.2* 9.0*  HCT 32.6*  < > 28.2*  < > 30.4* 28.4* 29.0* 27.5* 27.0* 27.1*  PLT <20.0 Repeated and verified X2.*  < > 22*  --  26*  16*  --  23*  --  92*  MCV 89.7  < > 90.7  --  91.0 90.4  --  89.0  --  89.4  MCH  --   < > 29.3  --  29.3 29.6  --  28.8  --  29.7  MCHC 33.4  < > 32.3  --  32.2 32.7  --  32.4  --  33.2  RDW 15.9*  < > 15.7*  --  15.4 15.4  --  14.9  --  15.2  LYMPHSABS 1.0  --   --   --   --   --   --   --   --   --   MONOABS 0.5  --   --   --   --   --   --   --   --   --   EOSABS 0.2  --   --   --   --   --   --   --   --   --   BASOSABS 0.0  --   --   --   --   --   --   --   --   --   < > = values in this interval not displayed.  Chemistries   Recent Labs Lab 04/16/14 0428 04/17/14 0650 04/18/14 0500 04/18/14 1110 04/18/14 1148 04/19/14 0520 04/19/14 1106 04/20/14 0954  NA 134* 139 144 143 142 141 140 139  K 3.5* 3.5* 3.2* 2.9* 3.0* 3.5* 3.6* 3.7  CL 100 101 100 95* 100 101 95* 97  CO2 24 26 36*  --  32 32  --   --   GLUCOSE 172* 147* 100* 141* 137* 103* 141* 171*  BUN 15 22 16 15 16 18 15 17   CREATININE 0.96 0.98 0.97 1.10 0.96 0.95 1.20 1.10  CALCIUM 8.4 8.8 9.2  --  9.1 8.6  --   --      Coagulation profile  Recent Labs Lab 04/15/14 0956 04/16/14 0428  INR 1.0 1.07    Recent Labs  04/20/14 0700 04/21/14 0530  RETICCTPCT 4.9* 5.4*    RADIOLOGY STUDIES/RESULTS: No results found.  Karen Kitchens [facility] Pager:[phone]  If 7PM-7AM, please contact night-coverage [email or web address] Password [email or web address] 04/21/2014, 1:06 PM   LOS: 6 days   **Disclaimer: This note may have been dictated with voice recognition software. Similar sounding words can inadvertently be transcribed and this note may contain transcription errors which may not have been corrected upon publication of note.**

## 2014-04-21 NOTE — Progress Notes (Signed)
  I have directly reviewed the clinical findings, lab, imaging studies and management of this patient in detail. I have interviewed and examined the patient and agree with the documentation,  as recorded by the Physician extender.  Thurnell Lose M.D on 04/21/2014 at 2:17 PM  [facility] Office  [phone]

## 2014-04-21 NOTE — Progress Notes (Signed)
Scott Waters continues to do okay. The lab results for his ADAMTS-13 did come back normal. This would make TTP unlikely. However, I'm still seeing some improvement with his"numbers". I would like to do another plasma exchange today. After today, I think we can probably get him on to some oral prednisone.  I do want to get a CT scan just to make sure how not overlooking an underlying lympho-proliferative process.  He's had no fever. He's had no bleeding he is a well. He is out of bed. He's had no cough or shortness of breath.  His vital signs look stable. Blood pressure is 143/84. He is afebrile. Lungs are clear. Oral exam shows no mucositis or thrush. Cardiac exam regular in rhythm. Abdomen is soft. There is no obvious liver or spleen tip. Back exam no tenderness over the spine. Extremities shows no clubbing cyanosis or edema. He has good strength and range of motion of his joints. Neurological exam shows no focal neurological deficits. Skin exam shows no ecchymoses or petechia.  Again, I think TTP would be on likely. I looked at his blood smear. 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  I still am glad that we were "proactive" with the plasma exchange. I think that we can probably begin to change our strategy.  Again, 1 last plasma exchange today. If we see dramatic improvement in his CBC, LDH, etc., and then maybe we continue the plasma exchange. If not, then I think we can probably get him on oral prednisone and see about discharge tomorrow.  I very much appreciate the outstanding care from the hospitalist and from the dialysis unit and from the staff on [address].  [name]  Romans 8:28

## 2014-04-22 ENCOUNTER — Telehealth: Payer: Self-pay | Admitting: Hematology & Oncology

## 2014-04-22 LAB — THERAPEUTIC PLASMA EXCHANGE (BLOOD BANK)
PLASMA VOLUME NEEDED: 3500
Plasma Exchange: 3500
Plasma volume needed: 3500
UNIT DIVISION: 0
UNIT DIVISION: 0
UNIT DIVISION: 0
UNIT DIVISION: 0
UNIT DIVISION: 0
UNIT DIVISION: 0
UNIT DIVISION: 0
UNIT DIVISION: 0
UNIT DIVISION: 0
Unit division: 0
Unit division: 0
Unit division: 0
Unit division: 0
Unit division: 0
Unit division: 0
Unit division: 0
Unit division: 0
Unit division: 0
Unit division: 0
Unit division: 0
Unit division: 0
Unit division: 0
Unit division: 0
Unit division: 0
Unit division: 0

## 2014-04-22 LAB — RETICULOCYTES
RBC.: 2.98 MIL/uL — ABNORMAL LOW (ref 4.22–5.81)
RETIC COUNT ABSOLUTE: 178.8 10*3/uL (ref 19.0–186.0)
Retic Ct Pct: 6 % — ABNORMAL HIGH (ref 0.4–3.1)

## 2014-04-22 LAB — CBC
HCT: 27.1 % — ABNORMAL LOW (ref 39.0–52.0)
Hemoglobin: 8.6 g/dL — ABNORMAL LOW (ref 13.0–17.0)
MCH: 28.9 pg (ref 26.0–34.0)
MCHC: 31.7 g/dL (ref 30.0–36.0)
MCV: 90.9 fL (ref 78.0–100.0)
PLATELETS: 137 10*3/uL — AB (ref 150–400)
RBC: 2.98 MIL/uL — ABNORMAL LOW (ref 4.22–5.81)
RDW: 15.2 % (ref 11.5–15.5)
WBC: 15.9 10*3/uL — ABNORMAL HIGH (ref 4.0–10.5)

## 2014-04-22 MED ORDER — PREDNISONE 20 MG PO TABS: 80.0000 mg | ORAL_TABLET | Freq: Every day | ORAL | Status: DC

## 2014-04-22 MED ORDER — FAMCICLOVIR 500 MG PO TABS: 250.0000 mg | ORAL_TABLET | Freq: Every day | ORAL | Status: DC

## 2014-04-22 MED ORDER — DIPHENHYDRAMINE HCL 25 MG PO CAPS
25.0000 mg | ORAL_CAPSULE | Freq: Four times a day (QID) | ORAL | Status: DC | PRN
Start: 2014-04-22 — End: 2014-04-22

## 2014-04-22 MED ORDER — ACD FORMULA A 0.73-2.45-2.2 GM/100ML VI SOLN
Status: AC
  Filled 2014-04-22: qty 500

## 2014-04-22 MED ORDER — PANTOPRAZOLE SODIUM 40 MG PO TBEC
40.0000 mg | DELAYED_RELEASE_TABLET | Freq: Every day | ORAL | Status: DC
Start: 2014-04-22 — End: 2014-05-23

## 2014-04-22 MED ORDER — SODIUM CHLORIDE 0.9 % IV SOLN
4.0000 g | Freq: Once | INTRAVENOUS | Status: DC
  Filled 2014-04-22: qty 40

## 2014-04-22 MED ORDER — ANTICOAGULANT SODIUM CITRATE 4% (200MG/5ML) IV SOLN
5.0000 mL | Freq: Once | Status: DC
  Filled 2014-04-22: qty 250

## 2014-04-22 MED ORDER — CALCIUM CARBONATE ANTACID 500 MG PO CHEW
2.0000 | CHEWABLE_TABLET | ORAL | Status: DC
  Filled 2014-04-22 (×2): qty 2

## 2014-04-22 MED ORDER — SULFAMETHOXAZOLE-TMP DS 800-160 MG PO TABS: 1.0000 | ORAL_TABLET | ORAL | Status: DC

## 2014-04-22 MED ORDER — IMMUNE GLOBULIN (HUMAN) 10 GM/100ML IV SOLN
1.0000 g/kg | INTRAVENOUS | Status: AC
  Administered 2014-04-22: 90 g via INTRAVENOUS
  Filled 2014-04-22: qty 900

## 2014-04-22 MED ORDER — ACETAMINOPHEN 325 MG PO TABS: 650.0000 mg | ORAL_TABLET | ORAL | Status: DC | PRN

## 2014-04-22 MED ORDER — FAMCICLOVIR 500 MG PO TABS
250.0000 mg | ORAL_TABLET | Freq: Every day | ORAL | Status: DC
  Administered 2014-04-22: 250 mg via ORAL
  Filled 2014-04-22: qty 0.5

## 2014-04-22 MED ORDER — ACD FORMULA A 0.73-2.45-2.2 GM/100ML VI SOLN: 500.0000 mL | Status: DC

## 2014-04-22 MED ORDER — SULFAMETHOXAZOLE-TMP DS 800-160 MG PO TABS
1.0000 | ORAL_TABLET | ORAL | Status: DC
  Administered 2014-04-22: 1 via ORAL
  Filled 2014-04-22: qty 1

## 2014-04-22 NOTE — Discharge Summary (Signed)
  I have directly reviewed the clinical findings, lab, imaging studies and management of this patient in detail. I have interviewed and examined the patient and agree with the documentation,  as recorded by the Physician extender.  Jodilyn Giese K Celese Banner M.D on 04/22/2014 at 1:44 PM  [facility] Office  [phone]  

## 2014-04-22 NOTE — Telephone Encounter (Signed)
Anne at [facility] will give pt 6-3 appointment time and address for hospital follow up appointment.

## 2014-04-22 NOTE — Discharge Instructions (Signed)
Please see Dr. [name] in his office in 1 week.  Take Prednisone, Bactrim, and Famvir until Seeing Dr. [name].

## 2014-04-22 NOTE — Progress Notes (Signed)
Mr. Drewes platelet count was up to 93,000 yesterday. I am not convinced that he has TTP. 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 As such, I think that we can probably stop the plasma exchange. I will give him a dose of IVIG today. We will start him on high-dose prednisone.  He looks good. He feels good. He's had a little nausea with the plasma exchange. He's had no bleeding. He's had no fever. He's had no diarrhea.  He had his CT scan done yesterday. This did not show any lymphadenopathy or splenomegaly or hepatic lesions.  I think that we are probably let him go home today after he has the IVIG.  I will put him on Famvir and Bactrim as prophylaxis with him being on prednisone.  His vital signs are stable. Blood pressure 133/82. Temperature 98.1. Pulse 69. Head and neck exam shows no scleral icterus. He has no oral lesions. Lungs are clear. Cardiac exam regular rate rhythm. Abdomen is soft. There is no palpable liver spleen. Skin exam shows no obvious petechia or ecchymoses. No neurological findings were appreciated.  Again, I think we can let him go as an outpatient now. We will discontinue the plasma exchange. We will give him IVIG. He'll start high-dose prednisone.  I will get him back in [place] office early next week to check his labs and to see how he is doing.  Given the fact that his ADAMTS-13 was normal, this gives me more confidence that we are not dealing with TTP.  Scott E.  Psalm 121:1-2

## 2014-04-22 NOTE — Discharge Summary (Signed)
Physician Discharge Summary  Scott Waters YIF:[id number] DOB: 04-15-1956 DOA: 04/15/2014  PCP: [name], MD  Admit date: 04/15/2014 Discharge date: 04/22/2014  Time spent: 50  minutes  Recommendations for Outpatient Follow-up:  1. Follow up with Dr. [name] in 1 week.  Determine end dates for prednisone, famvir, and bactrim 2. Follow with PCP for cbc and bmet 3. May return to work after being cleared by Dr. [name].   Discharge Diagnoses:  Principal Problem:   Thrombocytopenia Active Problems:   HYPERTENSION   Nosebleed   Bruising    Discharge Condition: stable.  Diet recommendation: as tolerated.  Filed Weights   04/21/14 0500 04/21/14 1350 04/22/14 0614  Weight: 88.9 kg (195 lb 15.8 oz) 90.3 kg (199 lb 1.2 oz) 90.039 kg (198 lb 8 oz)    History of present illness:  Scott Waters is a 58 y.o. male with Past medical history of hypertension, dyslipidemia, gout.  The patient was sent from his PCPs office for severe thrombocytopenia. 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 He mentions that since last month he has been having on and off spontaneous nosebleed as well as become bleed. He denies any loose tarry black stool but does mention he has daily hard black bowel movement. He denies any abdominal pain or acid reflux. At present he mentions he does not have any active bleeding.   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 Subsequently IR consulted for Dialysis catheter placement, and patient underwent Plasmapharesis. Initially started on IVIG-which was then discontinued. Platelet count improved dramatically 5/28 - 5/29 to 137.  ADAMTS13 level does not indicate TTP.  He will be discharged  on 5/29 on prednisone, famvir, and bactrim with close hematology follow up.   Epistaxis  -resolved  -secondary to thrombocytopenia   Melena  -resolved-likely  secondary to swallowed blood from Epistaxis. No GI bleed at this time. Continue PPI, and follow clinical course. Admitting MD d/w GI on call-unable to perform endoscopic procedures given severe thrombytopenia. Hb stable at approximately 9.0.   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 Platelets improving  -No frank bleeding or buising.   HTN  -stable with Amlodipine      Procedures:  Plasmapharesis  Consultations:  hematology  Discharge Exam: Filed Vitals:   04/22/14 1220  BP: 150/64  Pulse: 79  Temp: 98.4 F (36.9 C)  Resp: 16   Gen Exam: Awake and alert with clear speech. Pleasant. Well appearing. Unchanged physical exam  Neck: Supple, No JVD.  Chest: B/L Clear.  CVS: S1 S2 Regular, no murmurs.  Abdomen: soft, BS +, non tender, non distended.  Extremities: no edema, lower extremities warm to touch. TED hose in place  Neurologic: Non Focal.  Skin: Mild bruising in the left lower leg and left upper thigh-no other purpuric sites seen.    Discharge Instructions       Discharge Instructions   Diet - low sodium heart healthy    Complete by:  As directed      Increase activity slowly    Complete by:  As directed             Medication List    STOP taking these medications       GINSENG PO     OVER THE COUNTER MEDICATION      TAKE these medications       AZOR 10-40 MG  per tablet  Generic drug:  amLODipine-olmesartan  Take 1 tablet by mouth daily.     CINNAMON PO  Take 2 capsules by mouth daily.     famciclovir 500 MG tablet  Commonly known as:  FAMVIR  Take 0.5 tablets (250 mg total) by mouth daily.     fexofenadine 180 MG tablet  Commonly known as:  ALLEGRA  Take 180 mg by mouth daily.     fish oil-omega-3 fatty acids 1000 MG capsule  Take 2 g by mouth daily.     fluticasone 50 MCG/ACT nasal spray  Commonly known as:  FLONASE  Place 2 sprays into both nostrils daily as needed for allergies.     GLUCOSAMINE  HCL-MSM PO  Take 3 tablets by mouth daily.     ibuprofen 200 MG tablet  Commonly known as:  ADVIL,MOTRIN  Take 400 mg by mouth 2 (two) times daily as needed (pain).     multivitamin with minerals Tabs tablet  Take 1 tablet by mouth daily.     pantoprazole 40 MG tablet  Commonly known as:  PROTONIX  Take 1 tablet (40 mg total) by mouth daily.     potassium chloride 10 MEQ tablet  Commonly known as:  K-DUR  Take 10 mEq by mouth daily.     predniSONE 20 MG tablet  Commonly known as:  DELTASONE  Take 4 tablets (80 mg total) by mouth daily with breakfast.     simvastatin 40 MG tablet  Commonly known as:  ZOCOR  Take 40 mg by mouth daily.     sulfamethoxazole-trimethoprim 800-160 MG per tablet  Commonly known as:  BACTRIM DS  Take 1 tablet by mouth 3 (three) times a week.       No Known Allergies Follow-up Information   Follow up with [name], MD On 04/27/2014. (Appointment with Dr. [name] is on june 3rd at [place])    Specialty:  Oncology   Contact information:   [place], [address] [place] [address] [phone]       Follow up with [name], MD On 05/03/2014. (Appointment with Dr. Regis Bill is on 05/03/14 at 2:30)    Specialty:  Internal Medicine   Contact information:   [place] [address] [phone]        The results of significant diagnostics from this hospitalization (including imaging, microbiology, ancillary and laboratory) are listed below for reference.    Significant Diagnostic Studies: Ct Chest W Contrast  04/21/2014   CLINICAL DATA:  Anemia, thrombocytopenia, elevated LDH. Evaluate for lymphoma.  EXAM: CT CHEST, ABDOMEN, AND PELVIS WITH CONTRAST  TECHNIQUE: Multidetector CT imaging of the chest, abdomen and pelvis was performed following the standard protocol during bolus administration of intravenous contrast.  CONTRAST:  100 mL Omnipaque 300 IV  COMPARISON:  None.  FINDINGS: CT CHEST FINDINGS  Lungs are  clear. No suspicious pulmonary nodules. No pleural effusion or pneumothorax.  Visualized thyroid is unremarkable.  The heart is normal in size. No pericardial effusion. Coronary atherosclerosis. Mild atherosclerotic calcifications of the aortic arch.  No suspicious mediastinal or hilar lymphadenopathy. Small bilateral axillary nodes measuring up to 8 mm short axis, likely within normal limits.  Mild degenerative changes of the thoracic spine.  Sclerosis involving the T9 vertebral body (sagittal image 53) and likely extending into the posterior elements (series 3/image 38). Although not definitely expansile, the coarsened trabecular architecture favors Paget's disease.  CT ABDOMEN AND PELVIS FINDINGS  Liver, pancreas, and  left adrenal gland is within normal limits.  2.2 x 2.4 cm right adrenal nodule with macroscopic fat (series 3/image 57), suggesting a benign adrenal myelolipoma.  Spleen is normal in size.  Gallbladder is unremarkable. No intrahepatic or extrahepatic ductal dilatation.  Tiny interpolar left renal cyst (series 3/ image 56). Right kidney is within normal limits. No hydronephrosis.  No evidence of bowel obstruction. Normal appendix. Colonic diverticulosis, without associated inflammatory changes.  Atherosclerotic calcifications of the abdominal aorta and branch vessels.  No abdominopelvic ascites.  Small retroperitoneal lymph nodes measuring 6-7 mm short axis, within normal limits.  Prostate is unremarkable.  Bladder is within normal limits.  Grade 1 spondylolisthesis at L5-S1. Mild degenerative changes of the visualized lumbar spine.  IMPRESSION: No findings suspicious for lymphoma in the chest, abdomen, or pelvis.  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   Electronically Signed   By: Julian Hy M.D.   On: 04/21/2014 11:05   Ct Abdomen Pelvis W Contrast  04/21/2014   CLINICAL DATA:  Anemia,  thrombocytopenia, elevated LDH. Evaluate for lymphoma.  EXAM: CT CHEST, ABDOMEN, AND PELVIS WITH CONTRAST  TECHNIQUE: Multidetector CT imaging of the chest, abdomen and pelvis was performed following the standard protocol during bolus administration of intravenous contrast.  CONTRAST:  100 mL Omnipaque 300 IV  COMPARISON:  None.  FINDINGS: CT CHEST FINDINGS  Lungs are clear. No suspicious pulmonary nodules. No pleural effusion or pneumothorax.  Visualized thyroid is unremarkable.  The heart is normal in size. No pericardial effusion. Coronary atherosclerosis. Mild atherosclerotic calcifications of the aortic arch.  No suspicious mediastinal or hilar lymphadenopathy. Small bilateral axillary nodes measuring up to 8 mm short axis, likely within normal limits.  Mild degenerative changes of the thoracic spine.  Sclerosis involving the T9 vertebral body (sagittal image 53) and likely extending into the posterior elements (series 3/image 38). Although not definitely expansile, the coarsened trabecular architecture favors Paget's disease.  CT ABDOMEN AND PELVIS FINDINGS  Liver, pancreas, and left adrenal gland is within normal limits.  2.2 x 2.4 cm right adrenal nodule with macroscopic fat (series 3/image 57), suggesting a benign adrenal myelolipoma.  Spleen is normal in size.  Gallbladder is unremarkable. No intrahepatic or extrahepatic ductal dilatation.  Tiny interpolar left renal cyst (series 3/ image 56). Right kidney is within normal limits. No hydronephrosis.  No evidence of bowel obstruction. Normal appendix. Colonic diverticulosis, without associated inflammatory changes.  Atherosclerotic calcifications of the abdominal aorta and branch vessels.  No abdominopelvic ascites.  Small retroperitoneal lymph nodes measuring 6-7 mm short axis, within normal limits.  Prostate is unremarkable.  Bladder is within normal limits.  Grade 1 spondylolisthesis at L5-S1. Mild degenerative changes of the visualized lumbar spine.   IMPRESSION: No findings suspicious for lymphoma in the chest, abdomen, or pelvis.  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   Electronically Signed   By: Julian Hy M.D.   On: 04/21/2014 11:05   Ir Fluoro Guide Cv Line Right  04/16/2014   CLINICAL DATA:  58 year old male with severe thrombocytopenia thought to be related to TTP. He requires urgent plasmapheresis. He presents to Interventional Radiology for ultrasound-guided placement of a trial assist catheter to minimize the risk of bleeding complication.  EXAM: IR RIGHT FLOURO GUIDE CV LINE; IR ULTRASOUND GUIDANCE VASC ACCESS RIGHT  FLUOROSCOPY TIME:  54 seconds  TECHNIQUE:  The right neck was prepped with chlorhexidine, draped in the usual sterile fashion using maximum barrier technique (cap and mask, sterile gown, sterile gloves, large sterile sheet, hand hygiene and cutaneous antiseptic). Local anesthesia was attained by infiltration with 1% lidocaine.  Ultrasound demonstrated patency of the right internal jugular vein, and this was documented with an image. Under real-time ultrasound guidance, this vein was accessed with a 21 gauge micropuncture needle and image documentation was performed. With the aid of the transitional 4 French micro sheath, and 035 wire was advanced into the right atrium. A Bard Trialysis catheter was then advanced over the wire in position with the tip in the mid right atrium. Fluoroscopy during the procedure and fluoro spot radiograph confirms appropriate catheter position. The catheter was flushed, secured to the skin with Prolene sutures, and covered with a sterile dressing. The dialysis lumens were locked with 1,000 units/ml heparinized saline.  COMPLICATIONS: None.  The patient tolerated the procedure well.  IMPRESSION: Successful placement of a right IJ approach Trialysis catheter with sonographic and fluoroscopic guidance. The  catheter is ready for use.  Signed,  Criselda Peaches, MD  [facility]  [facility]   Electronically Signed   By: Jacqulynn Cadet M.D.   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 He requires urgent plasmapheresis. He presents to Interventional Radiology for ultrasound-guided placement of a trial assist catheter to minimize the risk of bleeding complication.  EXAM: IR RIGHT FLOURO GUIDE CV LINE; IR ULTRASOUND GUIDANCE VASC ACCESS RIGHT  FLUOROSCOPY TIME:  54 seconds  TECHNIQUE: The right neck was prepped with chlorhexidine, draped in the usual sterile fashion using maximum barrier technique (cap and mask, sterile gown, sterile gloves, large sterile sheet, hand hygiene and cutaneous antiseptic). Local anesthesia was attained by infiltration with 1% lidocaine.  Ultrasound demonstrated patency of the right internal jugular vein, and this was documented with an image. Under real-time ultrasound guidance, this vein was accessed with a 21 gauge micropuncture needle and image documentation was performed. With the aid of the transitional 4 French micro sheath, and 035 wire was advanced into the right atrium. A Bard Trialysis catheter was then advanced over the wire in position with the tip in the mid right atrium. Fluoroscopy during the procedure and fluoro spot radiograph confirms appropriate catheter position. The catheter was flushed, secured to the skin with Prolene sutures, and covered with a sterile dressing. The dialysis lumens were locked with 1,000 units/ml heparinized saline.  COMPLICATIONS: None.  The patient tolerated the procedure well.  IMPRESSION: Successful placement of a right IJ approach Trialysis catheter with sonographic and fluoroscopic guidance. The catheter is ready for use.  Signed,  Criselda Peaches, MD  [facility]  [facility]   Electronically Signed   By: Jacqulynn Cadet M.D.   On: 04/16/2014 13:18     Labs: Basic Metabolic Panel:  Recent Labs Lab 04/16/14 0428 04/17/14 0650 04/18/14 0500 04/18/14 1110 04/18/14 1148 04/19/14 0520 04/19/14 1106 04/20/14 0954 04/21/14 1342  NA 134* 139 144 143 142 141 140 139 140  K 3.5* 3.5* 3.2* 2.9* 3.0* 3.5* 3.6* 3.7 4.0  CL 100 101 100 95* 100 101 95* 97 98  CO2 24 26 36*  --  32 32  --   --   --   GLUCOSE 172* 147* 100* 141*  137* 103* 141* 171* 163*  BUN 15 22 16 15 16 18 15 17 20   CREATININE 0.96 0.98 0.97 1.10 0.96 0.95 1.20 1.10 1.10  CALCIUM 8.4 8.8 9.2  --  9.1 8.6  --   --   --   PHOS  --   --   --   --  2.9  --   --   --   --    Liver Function Tests:  Recent Labs Lab 04/15/14 1700 04/16/14 0428 04/17/14 0650 04/18/14 0500 04/18/14 1148  AST 47* 33 22 25  --   ALT 42 32 23 26  --   ALKPHOS 110 93 79 63  --   BILITOT 0.5 0.7 0.4 0.4  --   PROT 6.8 7.7 6.9 6.0  --   ALBUMIN 3.6 3.0* 3.2* 3.3* 3.5   CBC:  Recent Labs Lab 04/18/14 1148 04/19/14 0520  04/20/14 0700 04/20/14 0954 04/21/14 0530 04/21/14 1342 04/22/14 0510  WBC 14.6* 8.9  --  12.4*  --  14.8*  --  15.9*  HGB 9.8* 9.3*  < > 8.9* 9.2* 9.0* 9.9* 8.6*  HCT 30.4* 28.4*  < > 27.5* 27.0* 27.1* 29.0* 27.1*  MCV 91.0 90.4  --  89.0  --  89.4  --  90.9  PLT 26* 16*  --  23*  --  92*  --  137*  < > = values in this interval not displayed.     SignedKaren Kitchens [phone]  [facility] 04/22/2014, 1:22 PM

## 2014-04-22 NOTE — Care Management Note (Signed)
    Page 1 of 1   04/22/2014     3:48:53 PM CARE MANAGEMENT NOTE 04/22/2014  Patient:  Scott Waters,Scott Waters   Account Number:  [id number]  Date Initiated:  04/22/2014  Documentation initiated by:  Tomi Bamberger  Subjective/Objective Assessment:   dx ttp  admit- lives with son     Action/Plan:   Anticipated DC Date:  04/22/2014   Anticipated DC Plan:  [place]  CM consult      Choice offered to / List presented to:             Status of service:  Completed, signed off Medicare Important Message given?   (If response is "NO", the following Medicare IM given date fields will be blank) Date Medicare IM given:   Date Additional Medicare IM given:    Discharge Disposition:  HOME/SELF CARE  Per UR Regulation:  Reviewed for med. necessity/level of care/duration of stay  If discussed at [place] of Stay Meetings, dates discussed:    Comments:

## 2014-04-22 NOTE — Progress Notes (Signed)
Nsg Discharge Note  Admit Date:  04/15/2014 Discharge date: 04/22/2014   Scott Waters to be D/C'd Home per MD order.  AVS completed.  Copy for chart, and copy for patient signed, and dated. Patient/caregiver able to verbalize understanding.  Discharge Medication:   Medication List    STOP taking these medications       GINSENG PO     OVER THE COUNTER MEDICATION      TAKE these medications       AZOR 10-40 MG per tablet  Generic drug:  amLODipine-olmesartan  Take 1 tablet by mouth daily.     CINNAMON PO  Take 2 capsules by mouth daily.     famciclovir 500 MG tablet  Commonly known as:  FAMVIR  Take 0.5 tablets (250 mg total) by mouth daily.     fexofenadine 180 MG tablet  Commonly known as:  ALLEGRA  Take 180 mg by mouth daily.     fish oil-omega-3 fatty acids 1000 MG capsule  Take 2 g by mouth daily.     fluticasone 50 MCG/ACT nasal spray  Commonly known as:  FLONASE  Place 2 sprays into both nostrils daily as needed for allergies.     GLUCOSAMINE HCL-MSM PO  Take 3 tablets by mouth daily.     ibuprofen 200 MG tablet  Commonly known as:  ADVIL,MOTRIN  Take 400 mg by mouth 2 (two) times daily as needed (pain).     multivitamin with minerals Tabs tablet  Take 1 tablet by mouth daily.     pantoprazole 40 MG tablet  Commonly known as:  PROTONIX  Take 1 tablet (40 mg total) by mouth daily.     potassium chloride 10 MEQ tablet  Commonly known as:  K-DUR  Take 10 mEq by mouth daily.     predniSONE 20 MG tablet  Commonly known as:  DELTASONE  Take 4 tablets (80 mg total) by mouth daily with breakfast.     simvastatin 40 MG tablet  Commonly known as:  ZOCOR  Take 40 mg by mouth daily.     sulfamethoxazole-trimethoprim 800-160 MG per tablet  Commonly known as:  BACTRIM DS  Take 1 tablet by mouth 3 (three) times a week.        Discharge Assessment: Filed Vitals:   04/22/14 1450  BP: 142/75  Pulse: 80  Temp: 98.3 F (36.8 C)  Resp: 18   Skin  clean, dry and intact without evidence of skin break down, no evidence of skin tears noted. IV catheter discontinued intact. Site without signs and symptoms of complications - no redness or edema noted at insertion site, patient denies c/o pain - only slight tenderness at site.  Dressing with slight pressure applied.  D/c Instructions-Education: Discharge instructions given to patient/family with verbalized understanding. D/c education completed with patient/family including follow up instructions, medication list, d/c activities limitations if indicated, with other d/c instructions as indicated by MD - patient able to verbalize understanding, all questions fully answered. Patient instructed to return to ED, call 911, or call MD for any changes in condition.  Patient escorted via [place], and D/C home via private auto.  Elissa Hefty Nataliya Graig, RN 04/22/2014 4:55 PM

## 2014-04-23 ENCOUNTER — Other Ambulatory Visit: Payer: Self-pay | Admitting: Internal Medicine

## 2014-04-25 NOTE — Telephone Encounter (Signed)
Sent to the pharmacy for 1 year.  Seen for CPE on 04/15/14.

## 2014-04-26 ENCOUNTER — Other Ambulatory Visit: Payer: Self-pay | Admitting: *Deleted

## 2014-04-26 DIAGNOSIS — D72819 Decreased white blood cell count, unspecified: Secondary | ICD-10-CM

## 2014-04-27 ENCOUNTER — Other Ambulatory Visit: Payer: Self-pay

## 2014-04-27 ENCOUNTER — Encounter (HOSPITAL_COMMUNITY): Payer: Self-pay

## 2014-04-27 ENCOUNTER — Inpatient Hospital Stay (HOSPITAL_COMMUNITY)
Admission: AD | Admit: 2014-04-27 | Discharge: 2014-04-30 | DRG: 813 | Disposition: A | Discharge status: Home / Self Care | Payer: BC Managed Care – PPO | Source: Ambulatory Visit | Attending: Hematology & Oncology | Admitting: Hematology & Oncology

## 2014-04-27 ENCOUNTER — Other Ambulatory Visit (HOSPITAL_BASED_OUTPATIENT_CLINIC_OR_DEPARTMENT_OTHER): Payer: BC Managed Care – PPO | Admitting: Lab

## 2014-04-27 ENCOUNTER — Ambulatory Visit (HOSPITAL_BASED_OUTPATIENT_CLINIC_OR_DEPARTMENT_OTHER): Payer: BC Managed Care – PPO | Admitting: Hematology & Oncology

## 2014-04-27 DIAGNOSIS — D696 Thrombocytopenia, unspecified: Secondary | ICD-10-CM

## 2014-04-27 DIAGNOSIS — I10 Essential (primary) hypertension: Secondary | ICD-10-CM | POA: Diagnosis present

## 2014-04-27 DIAGNOSIS — Z860100 Personal history of colon polyps, unspecified: Secondary | ICD-10-CM

## 2014-04-27 DIAGNOSIS — E785 Hyperlipidemia, unspecified: Secondary | ICD-10-CM | POA: Diagnosis present

## 2014-04-27 DIAGNOSIS — Z87891 Personal history of nicotine dependence: Secondary | ICD-10-CM

## 2014-04-27 DIAGNOSIS — R740 Nonspecific elevation of levels of transaminase and lactic acid dehydrogenase [LDH]: Secondary | ICD-10-CM

## 2014-04-27 DIAGNOSIS — R7401 Elevation of levels of liver transaminase levels: Secondary | ICD-10-CM | POA: Diagnosis present

## 2014-04-27 DIAGNOSIS — Z833 Family history of diabetes mellitus: Secondary | ICD-10-CM

## 2014-04-27 DIAGNOSIS — D693 Immune thrombocytopenic purpura: Principal | ICD-10-CM | POA: Diagnosis present

## 2014-04-27 DIAGNOSIS — Z8249 Family history of ischemic heart disease and other diseases of the circulatory system: Secondary | ICD-10-CM

## 2014-04-27 DIAGNOSIS — R7402 Elevation of levels of lactic acid dehydrogenase (LDH): Secondary | ICD-10-CM | POA: Diagnosis present

## 2014-04-27 DIAGNOSIS — Z79899 Other long term (current) drug therapy: Secondary | ICD-10-CM

## 2014-04-27 DIAGNOSIS — D72819 Decreased white blood cell count, unspecified: Secondary | ICD-10-CM

## 2014-04-27 DIAGNOSIS — K219 Gastro-esophageal reflux disease without esophagitis: Secondary | ICD-10-CM | POA: Diagnosis present

## 2014-04-27 DIAGNOSIS — Z800 Family history of malignant neoplasm of digestive organs: Secondary | ICD-10-CM

## 2014-04-27 LAB — COMPREHENSIVE METABOLIC PANEL
ALBUMIN: 3.6 g/dL (ref 3.5–5.2)
ALT: 42 U/L (ref 0–53)
AST: 34 U/L (ref 0–37)
Alkaline Phosphatase: 65 U/L (ref 39–117)
BUN: 16 mg/dL (ref 6–23)
CALCIUM: 8.5 mg/dL (ref 8.4–10.5)
CHLORIDE: 104 meq/L (ref 96–112)
CO2: 26 mEq/L (ref 19–32)
CREATININE: 1.08 mg/dL (ref 0.50–1.35)
Glucose, Bld: 84 mg/dL (ref 70–99)
POTASSIUM: 3.2 meq/L — AB (ref 3.5–5.3)
Sodium: 137 mEq/L (ref 135–145)
Total Bilirubin: 0.4 mg/dL (ref 0.2–1.2)
Total Protein: 7.3 g/dL (ref 6.0–8.3)

## 2014-04-27 LAB — TECHNOLOGIST REVIEW CHCC SATELLITE

## 2014-04-27 LAB — CBC WITH DIFFERENTIAL (CANCER CENTER ONLY)
BASO#: 0.1 10*3/uL (ref 0.0–0.2)
BASO%: 0.3 % (ref 0.0–2.0)
EOS ABS: 0.1 10*3/uL (ref 0.0–0.5)
EOS%: 0.8 % (ref 0.0–7.0)
HCT: 32.1 % — ABNORMAL LOW (ref 38.7–49.9)
HGB: 10.5 g/dL — ABNORMAL LOW (ref 13.0–17.1)
LYMPH#: 3.1 10*3/uL (ref 0.9–3.3)
LYMPH%: 19.7 % (ref 14.0–48.0)
MCH: 29.5 pg (ref 28.0–33.4)
MCHC: 32.7 g/dL (ref 32.0–35.9)
MCV: 90 fL (ref 82–98)
MONO#: 1.3 10*3/uL — ABNORMAL HIGH (ref 0.1–0.9)
MONO%: 8.2 % (ref 0.0–13.0)
NEUT%: 71 % (ref 40.0–80.0)
NEUTROS ABS: 11.2 10*3/uL — AB (ref 1.5–6.5)
Platelets: <6 10*3/uL — CL (ref 145–400)
RBC: 3.56 10*6/uL — AB (ref 4.20–5.70)
RDW: 14.9 % (ref 11.1–15.7)
WBC: 15.8 10*3/uL — ABNORMAL HIGH (ref 4.0–10.0)

## 2014-04-27 LAB — URINALYSIS, ROUTINE W REFLEX MICROSCOPIC
Bilirubin Urine: NEGATIVE
Glucose, UA: NEGATIVE mg/dL
Hgb urine dipstick: NEGATIVE
KETONES UR: NEGATIVE mg/dL
LEUKOCYTES UA: NEGATIVE
Nitrite: NEGATIVE
PROTEIN: NEGATIVE mg/dL
Specific Gravity, Urine: 1.012 (ref 1.005–1.030)
Urobilinogen, UA: 0.2 mg/dL (ref 0.0–1.0)
pH: 6 (ref 5.0–8.0)

## 2014-04-27 LAB — RETICULOCYTES (CHCC)
ABS Retic: 123.8 10*3/uL (ref 19.0–186.0)
RBC.: 3.64 MIL/uL — ABNORMAL LOW (ref 4.22–5.81)
RETIC CT PCT: 3.4 % — AB (ref 0.4–2.3)

## 2014-04-27 LAB — HAPTOGLOBIN: Haptoglobin: 87 mg/dL (ref 45–215)

## 2014-04-27 LAB — CHCC SATELLITE - SMEAR

## 2014-04-27 LAB — LACTATE DEHYDROGENASE: LDH: 270 U/L — ABNORMAL HIGH (ref 94–250)

## 2014-04-27 MED ORDER — PANTOPRAZOLE SODIUM 40 MG PO TBEC
40.0000 mg | DELAYED_RELEASE_TABLET | Freq: Every day | ORAL | Status: DC
  Administered 2014-04-27 – 2014-04-30 (×4): 40 mg via ORAL
  Filled 2014-04-27 (×4): qty 1

## 2014-04-27 MED ORDER — FLUTICASONE PROPIONATE 50 MCG/ACT NA SUSP
2.0000 | Freq: Every day | NASAL | Status: DC | PRN
  Filled 2014-04-27: qty 16

## 2014-04-27 MED ORDER — IRBESARTAN 300 MG PO TABS
300.0000 mg | ORAL_TABLET | Freq: Every day | ORAL | Status: DC
  Administered 2014-04-27 – 2014-04-30 (×4): 300 mg via ORAL
  Filled 2014-04-27 (×4): qty 1

## 2014-04-27 MED ORDER — SODIUM CHLORIDE 0.9 % IV SOLN
INTRAVENOUS | Status: DC
  Administered 2014-04-27: 16:00:00 via INTRAVENOUS

## 2014-04-27 MED ORDER — DOCUSATE SODIUM 100 MG PO CAPS
100.0000 mg | ORAL_CAPSULE | Freq: Two times a day (BID) | ORAL | Status: DC
  Administered 2014-04-28 – 2014-04-30 (×2): 100 mg via ORAL
  Filled 2014-04-27 (×7): qty 1

## 2014-04-27 MED ORDER — AMLODIPINE BESYLATE 10 MG PO TABS
10.0000 mg | ORAL_TABLET | Freq: Every day | ORAL | Status: DC
  Administered 2014-04-27 – 2014-04-30 (×4): 10 mg via ORAL
  Filled 2014-04-27 (×4): qty 1

## 2014-04-27 MED ORDER — AMLODIPINE-OLMESARTAN 10-40 MG PO TABS: 1.0000 | ORAL_TABLET | Freq: Every day | ORAL | Status: DC

## 2014-04-27 MED ORDER — SULFAMETHOXAZOLE-TMP DS 800-160 MG PO TABS
1.0000 | ORAL_TABLET | ORAL | Status: DC
  Administered 2014-04-27: 1 via ORAL
  Filled 2014-04-27 (×2): qty 1

## 2014-04-27 MED ORDER — FAMCICLOVIR 500 MG PO TABS
250.0000 mg | ORAL_TABLET | Freq: Every day | ORAL | Status: DC
  Administered 2014-04-27 – 2014-04-30 (×4): 250 mg via ORAL
  Filled 2014-04-27 (×4): qty 0.5

## 2014-04-27 MED ORDER — FOLIC ACID 1 MG PO TABS
1.0000 mg | ORAL_TABLET | Freq: Every day | ORAL | Status: DC
  Administered 2014-04-27 – 2014-04-30 (×4): 1 mg via ORAL
  Filled 2014-04-27 (×4): qty 1

## 2014-04-27 MED ORDER — DEXAMETHASONE SODIUM PHOSPHATE 10 MG/ML IJ SOLN
40.0000 mg | INTRAMUSCULAR | Status: DC
  Administered 2014-04-27 – 2014-04-29 (×3): 40 mg via INTRAVENOUS
  Filled 2014-04-27 (×4): qty 4

## 2014-04-27 NOTE — Consult Note (Signed)
Reason for Consult:Thrombocytopecnia, request for bone marrow biopsy Consulting Radiologist: Anselm Pancoast Referring Physician: [name]   HPI: Scott Waters is an 58 y.o. male with thrombocytopenia. He was last admitted with presumed TTP and was treated with plasmapheresis. His discharge PLT count was 137. He was seen by Dr. [name] today in clinic and PLT count was profoundly low at <6k He has been admitted for ongoing workup/treatment and bone marrow biopsy has been ordered. Chart, PMHx, meds, labs reviewed. Pt up walking in his room, eating blueberries, offers no complaint  Past Medical History:  Past Medical History  Diagnosis Date  . Seasonal allergies   . Hypertension   . Hyperlipidemia   . Hx of colonic polyps 2007  . Gout 04/27/2011    One episode of joint pain at [facility]   Presumed    . Abnormal LFTs 04/27/2011  . Thrombocytopenia 04/15/2014    admitted for IVIG  . GERD (gastroesophageal reflux disease)     "mild"  . Osteoarthritis   . Arthritis     "knees" (04/16/2014)    Surgical History:  Past Surgical History  Procedure Laterality Date  . Polypectomy    . Multiple tooth extractions  2014    "took out all the top teeth"  . Tonsillectomy and adenoidectomy  1973  . Laceration repair Right ~ 1983    "inner knee; steel tubing fell on me"  . Shoulder arthroscopy Left 08/2001    "removed bone spur"    Family History:  Family History  Problem Relation Age of Onset  . Colon cancer Father 19    Died at 62  . Hypertension Mother   . Diabetes Mother   . Kidney disease Mother     diailysis from dm   . Stroke      Social History:  reports that he has quit smoking. His smoking use included Cigarettes. He has a 37 pack-year smoking history. He has quit using smokeless tobacco. He reports that he drinks about 16.8 ounces of alcohol per week. He reports that he uses illicit drugs.  Allergies: No Known Allergies  Medications: Current facility-administered medications:0.9 %  sodium  chloride infusion, , Intravenous, Continuous, [name], MD;  amLODipine (NORVASC) tablet 10 mg, 10 mg, Oral, Daily, [name], MD;  dexamethasone (DECADRON) injection 40 mg, 40 mg, Intravenous, Q24H, [name], MD;  docusate sodium (COLACE) capsule 100 mg, 100 mg, Oral, BID, [name], MD;  famciclovir [facility]) tablet 250 mg, 250 mg, Oral, Daily, [name], MD fluticasone (FLONASE) 50 MCG/ACT nasal spray 2 spray, 2 spray, Each Nare, Daily PRN, [name], MD;  folic acid (FOLVITE) tablet 1 mg, 1 mg, Oral, Daily, [name], MD;  irbesartan (AVAPRO) tablet 300 mg, 300 mg, Oral, Daily, [name], MD;  pantoprazole (PROTONIX) EC tablet 40 mg, 40 mg, Oral, Daily, [name], MD sulfamethoxazole-trimethoprim (BACTRIM DS) 800-160 MG per tablet 1 tablet, 1 tablet, Oral, Once per day on Mon Wed Fri, [name], MD  ROS: See HPI for pertinent findings, otherwise complete 10 system review negative.  Physical Exam: Blood pressure 130/76, pulse 104, temperature 98.9 F (37.2 C), temperature source Oral, resp. rate 18, height 5' 8"  (1.727 m), weight 202 lb 4.8 oz (91.763 kg), SpO2 100.00%. Gen: A&O x 3, NAD, 58yo AA male ENT: unremarkable airway Lungs: CTA without w/r/r Heart: Regular     Labs: CBC  Recent Labs  04/27/14 0844  WBC 15.8*  HGB 10.5*  HCT 32.1*  PLT <6*     No results found.  Assessment/Plan: Profound thrombocytopenia For CT guided Bone marrow biopsy tomorrow am. Discussed procedure, risks, complications, use of sedation. Labs reviewed. Consent signed in chart  Ascencion Dike PA-C 04/27/2014, 3:07 PM

## 2014-04-27 NOTE — H&P (Signed)
[facility]  [phone]    ADMISSION NOTE  Admitting MD: Scott Waters  Attending MD: Scott Waters  Reason for Admission:  Severe Thrombocytopenia   HPI: Scott Waters is an 58 y.o. male with a history of thrombocytopenia  followed by Dr. Marin Waters admitted on 04/27/14 for management of low platelet counts. Initially, this was thought to be secondary to ITP. However, due to elevated LDH and abnormal haptoglobin suspicious for TTP, Plasmapheresis was given and he was initiated on IVIG, then discontinued after achieving adequate counts. As of 5/29 Platelet count was 137,000. ADAMTS13 level was not suspicious for  TTP. [name] was discharged last week in stable condition, with prednisone 80 mg daily, famvir and bactrim. He presented to the [place] for follow up. His platelet counts were less than 6,000. WBC is 15.8, with similar values since last admission.  His anemia is stable. Patient has been experiencing intermittent left nostril minimal epistaxis, last event last night. He denies hematemesis, hematuria or hematochezia. He denies abdominal pain. No recent trips to the woods, tick bites. No new areas of bruising. He denies any respiratory or cardiac complaints. No abdominal pain. Denies any fever. No confusion. He was not on ASA or NSAIDs prior to admission Patient is to be admitted for further evaluation and management of his low platelet counts. Possible bone marrow biopsy may be performed to rule out malignant process.     PMH:  Past Medical History  Diagnosis Date  . Seasonal allergies   . Hypertension   . Hyperlipidemia   . Hx of colonic polyps 2007  . Gout 04/27/2011    One episode of joint pain at [facility]   Presumed    . Abnormal LFTs 04/27/2011  . Thrombocytopenia 04/15/2014    admitted for IVIG  . GERD (gastroesophageal reflux disease)     "mild"  . Osteoarthritis   . Arthritis     "knees" (04/16/2014)    Surgeries:  Past Surgical History    Procedure Laterality Date  . Polypectomy    . Multiple tooth extractions  2014    "took out all the top teeth"  . Tonsillectomy and adenoidectomy  1973  . Laceration repair Right ~ 1983    "inner knee; steel tubing fell on me"  . Shoulder arthroscopy Left 08/2001    "removed bone spur"    Allergies: No Known Allergies  Medications:   Prior to Admission:  Prescriptions prior to admission  Medication Sig Dispense Refill  . amLODipine-olmesartan (AZOR) 10-40 MG per tablet Take 1 tablet by mouth daily.      Marland Kitchen CINNAMON PO Take 2 capsules by mouth daily.       . famciclovir (FAMVIR) 500 MG tablet Take 0.5 tablets (250 mg total) by mouth daily.  10 tablet  0  . fexofenadine (ALLEGRA) 180 MG tablet Take 180 mg by mouth daily.       . fish oil-omega-3 fatty acids 1000 MG capsule Take 2 g by mouth daily.       . fluticasone (FLONASE) 50 MCG/ACT nasal spray Place 2 sprays into both nostrils daily as needed for allergies.      Marland Kitchen GLUCOSAMINE HCL-MSM PO Take 3 tablets by mouth daily.      Marland Kitchen ibuprofen (ADVIL,MOTRIN) 200 MG tablet Take 400 mg by mouth 2 (two) times daily as needed (pain).       . Multiple Vitamin (MULTIVITAMIN WITH MINERALS) TABS tablet Take 1 tablet by mouth daily.      Marland Kitchen  pantoprazole (PROTONIX) 40 MG tablet Take 1 tablet (40 mg total) by mouth daily.  30 tablet  0  . potassium chloride (K-DUR) 10 MEQ tablet Take 10 mEq by mouth daily.      . predniSONE (DELTASONE) 20 MG tablet Take 4 tablets (80 mg total) by mouth daily with breakfast.  60 tablet  0  . simvastatin (ZOCOR) 40 MG tablet Take 40 mg by mouth daily.      Marland Kitchen sulfamethoxazole-trimethoprim (BACTRIM DS) 800-160 MG per tablet Take 1 tablet by mouth 3 (three) times a week.  6 tablet  0  . [DISCONTINUED] AZOR 10-40 MG per tablet take 1 tablet by mouth once daily  30 tablet  11    JQB:HALPFXTKWIO  XBD:ZHGDJM of Systems  Constitutional: Negative for weight loss. Negative for fever, chills and malaise/fatigue.  Eyes:  Negative for blurred vision and double vision.  Respiratory: Negative for cough, hemoptysis  shortness of breath. Minimal left epistaxis as above Cardiovascular: Positive for chest pain. GI: No nausea vomiting diarrhea constipation. No change in bowel caliber. No  Melena   Hematochezia.  GU: No blood in urine. No loss of urinary control. Skin: Negative for itching. No rash. No petechia. No bruising Neurological: No headaches. No motor or sensory deficits. Rest of ROS negative  Family History:  Family History  Problem Relation Age of Onset  . Colon cancer Father 76    Died at 58  . Hypertension Mother   . Diabetes Mother   . Kidney disease Mother     diailysis from dm   . Stroke      Social History:  reports that he has quit smoking. His smoking use included Cigarettes. He has a 37 pack-year smoking history. He has quit using smokeless tobacco. He reports that he drinks about 16.8 ounces of alcohol per week. He reports that he uses illicit drugs.  Physical Exam:   Filed Vitals:   04/27/14 1406  BP: 130/76  Pulse: 104  Temp: 98.9 F (37.2 C)  Resp: 35    58 year old [place] American male,  in no acute distress, conversant, alert and oriented to time, place and date.  General well-developed and well-nourished  HEENT: Normocephalic, atraumatic.Sclera anicteric. Oral cavity without thrush or lesions. Neck: supple. No thyromegaly,no cervical or supraclavicular adenopathy  Lungs:clear to auscultation. No wheezing,rhonchi or rales. Cardiac:regular rate and rhythm,no murmur,rubs or gallops Abdomen: soft nontender,bowel sounds x4. Nohepatosplenomegaly Extremities:no clubbing cyanosis or edema. No petechial rash Neuro:No focal or motor deficits   LABS: CBC   Recent Labs Lab 04/21/14 0530 04/21/14 1342 04/22/14 0510 04/27/14 0844  WBC 14.8*  --  15.9* 15.8*  HGB 9.0* 9.9* 8.6* 10.5*  HCT 27.1* 29.0* 27.1* 32.1*  PLT 92*  --  137* <6*  MCV 89.4  --  90.9 90  MCH 29.7   --  28.9 29.5  MCHC 33.2  --  31.7 32.7  RDW 15.2  --  15.2 14.9  LYMPHSABS  --   --   --  3.1  EOSABS  --   --   --  0.1  BASOSABS  --   --   --  0.1     Anemia panel:  No results found for this basename: VITAMINB12, FOLATE, FERRITIN, TIBC, IRON, RETICCTPCT,  in the last 72 hours  No results found for this basename: TSH, T4TOTAL, FREET3, T3FREE, THYROIDAB,  in the last 72 hours   No results found for this basename: esrsedrate      CMP  Recent Labs Lab 04/21/14 1342  NA 140  K 4.0  CL 98  GLUCOSE 163*  BUN 20  CREATININE 1.10        Component Value Date/Time   BILITOT 0.4 04/18/2014 0500   BILIDIR 0.2 04/15/2014 0956   IBILI 0.4 10/17/2010 2001      No results found for this basename: INR, PROTIME,  in the last 168 hours  No results found for this basename: DDIMER,  in the last 72 hours    Imaging Studies: No results found.      A/P: 59 y.o. male with a history of  1. Thrombocytopenia,likely  immune.  Patient is currently on high dose prednisone at 80 mg a day by mouth. Despite this dose, his platelet count decreased to a severe level of 6000 with minimal epistaxis.  Will order a peripheral blood smear to rule out schistocytes. Check  LDH No  heparin products. A Bone Marrow biopsy to rule out MDS to be performed.   He may need transfusion of 1 unit of platelets plus IVIG 1g/kg/d for 3 days with methylprednisolone 1 g/d for 3 days.  2. Anemia Likely chronic in the setting of possible MDS and blood loss Monitor for now. No transfusion needed   3. Leukocytosis Likely due to steroid use Bone marrow biopsy to be performed by IR. Will rule out other etiology. Will monitor closely  3. Full Code    Scott Waters 04/27/2014 3:05 PM    ADDENDUM:  The labs do not look like TTP.  Has normal LDH and haptoglobin.  Will need bone marrow bx to make sure that nothing else is going on!  I will give some decadron.  May need another IVIG.  Is  w/o bleeding.  I saw him in the office and his exam was non-focal except for some echymosis.  Laurey Arrow

## 2014-04-28 ENCOUNTER — Ambulatory Visit (HOSPITAL_COMMUNITY): Payer: BC Managed Care – PPO

## 2014-04-28 ENCOUNTER — Encounter (HOSPITAL_COMMUNITY): Payer: Self-pay | Admitting: Radiology

## 2014-04-28 LAB — COMPREHENSIVE METABOLIC PANEL
ALT: 44 U/L (ref 0–53)
AST: 28 U/L (ref 0–37)
Albumin: 3.3 g/dL — ABNORMAL LOW (ref 3.5–5.2)
Alkaline Phosphatase: 76 U/L (ref 39–117)
BUN: 20 mg/dL (ref 6–23)
CO2: 24 mEq/L (ref 19–32)
CREATININE: 0.91 mg/dL (ref 0.50–1.35)
Calcium: 8.9 mg/dL (ref 8.4–10.5)
Chloride: 102 mEq/L (ref 96–112)
GLUCOSE: 127 mg/dL — AB (ref 70–99)
Potassium: 4.5 mEq/L (ref 3.7–5.3)
Sodium: 137 mEq/L (ref 137–147)
Total Bilirubin: 0.3 mg/dL (ref 0.3–1.2)
Total Protein: 7.6 g/dL (ref 6.0–8.3)

## 2014-04-28 LAB — APTT: aPTT: 23 seconds — ABNORMAL LOW (ref 24–37)

## 2014-04-28 LAB — GLUCOSE, CAPILLARY: GLUCOSE-CAPILLARY: 121 mg/dL — AB (ref 70–99)

## 2014-04-28 LAB — CBC
HEMATOCRIT: 31.6 % — AB (ref 39.0–52.0)
Hemoglobin: 10.4 g/dL — ABNORMAL LOW (ref 13.0–17.0)
MCH: 29.1 pg (ref 26.0–34.0)
MCHC: 32.9 g/dL (ref 30.0–36.0)
MCV: 88.3 fL (ref 78.0–100.0)
Platelets: <5 10*3/uL — CL (ref 150–400)
RBC: 3.58 MIL/uL — ABNORMAL LOW (ref 4.22–5.81)
RDW: 15 % (ref 11.5–15.5)
WBC: 13.9 10*3/uL — AB (ref 4.0–10.5)

## 2014-04-28 LAB — PROTIME-INR
INR: 0.97 (ref 0.00–1.49)
Prothrombin Time: 12.7 seconds (ref 11.6–15.2)

## 2014-04-28 LAB — LACTATE DEHYDROGENASE: LDH: 300 U/L — ABNORMAL HIGH (ref 94–250)

## 2014-04-28 LAB — BONE MARROW EXAM

## 2014-04-28 MED ORDER — MIDAZOLAM HCL 2 MG/2ML IJ SOLN
INTRAMUSCULAR | Status: AC | PRN
  Administered 2014-04-28 (×3): 1 mg via INTRAVENOUS

## 2014-04-28 MED ORDER — FENTANYL CITRATE 0.05 MG/ML IJ SOLN
INTRAMUSCULAR | Status: AC
  Filled 2014-04-28: qty 6

## 2014-04-28 MED ORDER — IMMUNE GLOBULIN (HUMAN) 10 GM/100ML IV SOLN
1.0000 g/kg | INTRAVENOUS | Status: AC
  Administered 2014-04-28 – 2014-04-29 (×2): 90 g via INTRAVENOUS
  Filled 2014-04-28 (×2): qty 900

## 2014-04-28 MED ORDER — ROMIPLOSTIM 250 MCG ~~LOC~~ SOLR
2.0000 ug/kg | Freq: Once | SUBCUTANEOUS | Status: AC
  Administered 2014-04-28: 185 ug via SUBCUTANEOUS
  Filled 2014-04-28: qty 0.37

## 2014-04-28 MED ORDER — MIDAZOLAM HCL 2 MG/2ML IJ SOLN
INTRAMUSCULAR | Status: AC
  Filled 2014-04-28: qty 6

## 2014-04-28 MED ORDER — FENTANYL CITRATE 0.05 MG/ML IJ SOLN
INTRAMUSCULAR | Status: AC | PRN
  Administered 2014-04-28 (×2): 50 ug via INTRAVENOUS

## 2014-04-28 NOTE — Progress Notes (Signed)
Mr. Demario is doing okay. He's had no bleeding. He will go for a bone marrow test today.  His platelet count still low. It is less than 5000.  I looked at his blood smear. I did not see any schistocytes.  I still have to believe that this is immune thrombocytopenia.  I will try IVIG again. I also will try a dose of Nplate. Hopefully, can do this as an inpatient.  He's had no bleeding. He's had no fever. He's had a cough. There is no abdominal pain.  His vital signs are all stable. Blood pressure 132/84. Lungs are clear. Cardiac exam regular rate and rhythm. Oral exam shows no petechia or ecchymoses. Abdomen soft. Has good bowel sounds. There is no fluid wave. There is no palpable liver or spleen. Extremities shows no edema in his legs. Has good strength bilaterally. Skin exam shows scattered ecchymoses. Neurological exam is non-focal.  We will do a bone marrow test today. I still presuming that this is immune based. As such, we will try the IVIG. We also will try the Nplate. Marland Kitchen He is on Decadron.  We will continue to check daily labs on him.  Boyd 55:22

## 2014-04-28 NOTE — Progress Notes (Signed)
Pt ambulating in the hall frequently.

## 2014-04-28 NOTE — Procedures (Signed)
Procedure:  CT guided right iliac bone marrow biopsy Findings:  Aspirate and core samples obtained.  No complications.

## 2014-04-28 NOTE — Progress Notes (Signed)
He is being admitted because of profound thrombocytopenia.  Other than his blood smear. I did not see any schistocytes. There were no nucleated red cells.  On his labs, he had a normal haptoglobin. His LDH was also normal at 270.  I am not sure as to why his platelet count is so down. He is on prednisone.  We probably need to do a bone marrow biopsy on him.  A admission note will be written.  Scott Waters understands what we have to admit him. At

## 2014-04-29 DIAGNOSIS — D693 Immune thrombocytopenic purpura: Principal | ICD-10-CM

## 2014-04-29 DIAGNOSIS — D649 Anemia, unspecified: Secondary | ICD-10-CM

## 2014-04-29 LAB — CBC
HCT: 26.1 % — ABNORMAL LOW (ref 39.0–52.0)
Hemoglobin: 8.7 g/dL — ABNORMAL LOW (ref 13.0–17.0)
MCH: 28.9 pg (ref 26.0–34.0)
MCHC: 32.6 g/dL (ref 30.0–36.0)
MCV: 88.8 fL (ref 78.0–100.0)
Platelets: 29 10*3/uL — CL (ref 150–400)
RBC: 2.94 MIL/uL — ABNORMAL LOW (ref 4.22–5.81)
RDW: 15.2 % (ref 11.5–15.5)
WBC: 15.2 10*3/uL — ABNORMAL HIGH (ref 4.0–10.5)

## 2014-04-29 LAB — SAVE SMEAR

## 2014-04-29 LAB — GLUCOSE, CAPILLARY: GLUCOSE-CAPILLARY: 120 mg/dL — AB (ref 70–99)

## 2014-04-29 NOTE — Progress Notes (Signed)
UR COMPLETED  

## 2014-04-29 NOTE — Progress Notes (Signed)
His platelet count has come up a little bit. Today, it was 29,000.  He has bone marrow last done a yesterday. Hopefully I will have some pulmonary results later on today.  There's been no bleeding. He's had no increase in bruising.  He's had no cough. There's been no shortness of breath. He's had no nausea.  There has been no headache.  He has not noted any leg swelling.  He is out of bed without difficulty.  He is more anemic. I suspect this probably is from the IVIG.  His vital signs are stable. Blood pressure 144/78. Temperature 98.5. Pulse is 70. Lungs are clear. Cardiac exam regular rate and rhythm. Abdomen is soft. He is good bowel sounds. There is no palpable liver or spleen tip. Back exam no tenderness over the spine ribs or hips. Extremities shows no clubbing cyanosis or edema.  If we continue to see his platelet count go up, then we should be able to discharge him.  I will start some folic acid.  He did have his bone marrow done. He maybe, there will be some preliminary results today.

## 2014-04-30 ENCOUNTER — Other Ambulatory Visit: Payer: Self-pay | Admitting: Hematology & Oncology

## 2014-04-30 ENCOUNTER — Encounter: Payer: Self-pay | Admitting: Hematology & Oncology

## 2014-04-30 DIAGNOSIS — E785 Hyperlipidemia, unspecified: Secondary | ICD-10-CM

## 2014-04-30 DIAGNOSIS — D693 Immune thrombocytopenic purpura: Secondary | ICD-10-CM | POA: Insufficient documentation

## 2014-04-30 DIAGNOSIS — I10 Essential (primary) hypertension: Secondary | ICD-10-CM

## 2014-04-30 LAB — BASIC METABOLIC PANEL WITH GFR
BUN: 23 mg/dL (ref 6–23)
CO2: 26 meq/L (ref 19–32)
Calcium: 8.1 mg/dL — ABNORMAL LOW (ref 8.4–10.5)
Chloride: 104 meq/L (ref 96–112)
Creatinine, Ser: 1.09 mg/dL (ref 0.50–1.35)
GFR calc Af Amer: 83 mL/min — ABNORMAL LOW
GFR calc non Af Amer: 72 mL/min — ABNORMAL LOW
Glucose, Bld: 136 mg/dL — ABNORMAL HIGH (ref 70–99)
Potassium: 4.1 meq/L (ref 3.7–5.3)
Sodium: 138 meq/L (ref 137–147)

## 2014-04-30 LAB — SAVE SMEAR

## 2014-04-30 LAB — GLUCOSE, CAPILLARY: Glucose-Capillary: 119 mg/dL — ABNORMAL HIGH (ref 70–99)

## 2014-04-30 LAB — CBC
HCT: 25.9 % — ABNORMAL LOW (ref 39.0–52.0)
Hemoglobin: 8.4 g/dL — ABNORMAL LOW (ref 13.0–17.0)
MCH: 29.1 pg (ref 26.0–34.0)
MCHC: 32.4 g/dL (ref 30.0–36.0)
MCV: 89.6 fL (ref 78.0–100.0)
Platelets: 81 10*3/uL — ABNORMAL LOW (ref 150–400)
RBC: 2.89 MIL/uL — ABNORMAL LOW (ref 4.22–5.81)
RDW: 15.2 % (ref 11.5–15.5)
WBC: 14.5 10*3/uL — AB (ref 4.0–10.5)

## 2014-04-30 LAB — LACTATE DEHYDROGENASE: LDH: 262 U/L — AB (ref 94–250)

## 2014-04-30 MED ORDER — DEXAMETHASONE 4 MG PO TABS: 4.0000 mg | ORAL_TABLET | Freq: Three times a day (TID) | ORAL | Status: DC

## 2014-04-30 MED ORDER — SULFAMETHOXAZOLE-TMP DS 800-160 MG PO TABS: 1.0000 | ORAL_TABLET | ORAL | Status: DC

## 2014-04-30 MED ORDER — FOLIC ACID 1 MG PO TABS: 1.0000 mg | ORAL_TABLET | Freq: Every day | ORAL | Status: DC

## 2014-04-30 NOTE — Progress Notes (Signed)
Patient d/c.Stable.Sandie Ano RN

## 2014-04-30 NOTE — Progress Notes (Signed)
Patient d/c instructions given, verbalized needs. patiwent is stable. Alert and orientedx4.Pleasant.- Sandie Ano RN

## 2014-04-30 NOTE — Discharge Summary (Signed)
Scott Waters, Scott Waters NO.:  [id number]  MEDICAL RECORD NO.:  [id number]  LOCATION:  3825                         FACILITY:  [facility]  PHYSICIAN:  [name], M.D.  DATE OF BIRTH:  07-07-1956  DATE OF ADMISSION:  04/27/2014 DATE OF DISCHARGE:  04/30/2014                              DISCHARGE SUMMARY   DISCHARGE DIAGNOSES: 1. Chronic immune thrombocytopenia - relapse. 2. Hypertension. 3. Hyperlipidemia. 4. Bone marrow biopsy and aspirate done on April 28, 2014.  CONDITION UPON DISCHARGE:  Stable.  DIET:  As tolerated.  FOLLOWUP:  The patient will follow up with Dr. [name] at the [facility] on May 02, 2014.  DISCHARGE MEDICATIONS: 1. Folic acid 1 mg p.o. daily. 2. Azor (10/40) one p.o. daily. 3. Famvir 250 mg p.o. daily. 4. Flonase nasal spray two sprays daily. 5. Protonix 40 mg p.o. daily. 6. Zocor 40 mg p.o. daily. 7. Bactrim DS 1 p.o. every Monday, Wednesday, and Friday. 8. Multivitamin daily.  HOSPITAL COURSE:  [name] was admitted from our office.  He is a 58- year-old gentleman.  He had recurrent thrombocytopenia.  He had been discharged from [facility] about a week before.  We thought he may have had TTP.  He had been on plasma exchange.  However, the results from some of his labs do not seem to suggest TTP.  He was discharged on prednisone 80 mg a day.  When he came into the office, his platelet count was less than 6000.  I had to readmit him for management.  On admission, his lab work showed a white cell count of 15.8, hemoglobin 10.5, and platelet count was less than 6.  LDH was 270.  His haptoglobin was 87.  His BUN and creatinine were 16 and 1.08.  I started him on high-dose Decadron 40 mg daily IV.  I also started him on IVIG.  He got 90 g dose of IVIG on April 28, 2014, and April 29, 2014. He tolerated this well.  I had to make sure that there was no underlying bone marrow pathology that I was not seen on his  lab work or under the microscope on his blood smear.  The bone marrow biopsy was done by Radiology on April 28, 2014. The pathology report ([id number]) showed a hypercellular marrow with granulocytic and megakaryocytic hyperplasia.  I suspect that the granulocytic hyperplasia was from his steroid.  He did have increase in megakaryocytes.  There was no lymphoid aggregates.  No granulomas were noted.  Iron stains were slightly decreased.  The megakaryocytes to be normal in morphology.  As such, I felt that his diagnosis was recurrent and chronic immune thrombocytopenia.  We continued his Decadron.  His platelet count began to come up.  On April 29, 2014, his platelet count was 29.  On April 30, 2014, his platelet count was 81,000.  His hemoglobin had dropped a little bit.  I felt this was a dilutional effect from the IVIG.  He is on folic acid.  I looked at his blood smear daily.  I do not see any schistocytes.  He had improving platelet numbers.  Platelets were somewhat large and well  granulated.  He maintained a stable situation over the next day.  As such, I felt we could let him go home, as he was asymptomatic.  I did have him on Famvir.  I will get him back on to Bactrim for prophylaxis.  He was eating well.  He was having no nausea or vomiting.  There was no bleeding.  He had no problems with diarrhea or constipation.  DISCHARGE PHYSICAL EXAMINATION:  VITAL SIGNS:  Temperature of 98.7, pulse 69, blood pressure 133/81. HEAD AND NECK:  No ocular or oral lesions.  There were no palpable cervical or supraclavicular lymph nodes.  He had no thrush. LUNGS:  Clear. CARDIAC:  Regular rate and rhythm with no murmurs, rubs, or bruits. ABDOMEN:  Soft with good bowel sounds.  There is no palpable abdominal mass.  There is no palpable liver or spleen tip. EXTREMITIES:  No clubbing, cyanosis, or edema. SKIN:  He had some scattered ecchymosis.  There are no petechiae. NEUROLOGIC:   Nonfocal.     [name], M.D.     PRE/MEDQ  D:  04/30/2014  T:  04/30/2014  Job:  [id number]  cc:   Standley Brooking. Regis Bill, MD

## 2014-04-30 NOTE — Discharge Summary (Signed)
#   [id number] is d/c summary.  Scott E.  Romans 8:28

## 2014-04-30 NOTE — Discharge Instructions (Signed)
Call with any bleeding, bruising, weakness, vomiting,diarrhea!!!!  Call if chills, shortness of breath, chest pain/palpitations

## 2014-05-02 ENCOUNTER — Ambulatory Visit: Payer: BC Managed Care – PPO

## 2014-05-02 ENCOUNTER — Ambulatory Visit (HOSPITAL_BASED_OUTPATIENT_CLINIC_OR_DEPARTMENT_OTHER): Payer: BC Managed Care – PPO | Admitting: Hematology & Oncology

## 2014-05-02 ENCOUNTER — Telehealth: Payer: Self-pay | Admitting: Internal Medicine

## 2014-05-02 ENCOUNTER — Encounter: Payer: Self-pay | Admitting: Hematology & Oncology

## 2014-05-02 ENCOUNTER — Other Ambulatory Visit (HOSPITAL_BASED_OUTPATIENT_CLINIC_OR_DEPARTMENT_OTHER): Payer: BC Managed Care – PPO | Admitting: Lab

## 2014-05-02 VITALS — BP 156/68 | HR 88 | Temp 98.6°F | Resp 18 | Ht 68.0 in | Wt 208.0 lb

## 2014-05-02 DIAGNOSIS — D693 Immune thrombocytopenic purpura: Secondary | ICD-10-CM

## 2014-05-02 LAB — CBC WITH DIFFERENTIAL (CANCER CENTER ONLY)
BASO#: 0 10*3/uL (ref 0.0–0.2)
BASO%: 0.2 % (ref 0.0–2.0)
EOS%: 0.3 % (ref 0.0–7.0)
Eosinophils Absolute: 0.1 10*3/uL (ref 0.0–0.5)
HCT: 30.5 % — ABNORMAL LOW (ref 38.7–49.9)
HEMOGLOBIN: 10 g/dL — AB (ref 13.0–17.1)
LYMPH#: 2.9 10*3/uL (ref 0.9–3.3)
LYMPH%: 15.5 % (ref 14.0–48.0)
MCH: 29.7 pg (ref 28.0–33.4)
MCHC: 32.8 g/dL (ref 32.0–35.9)
MCV: 91 fL (ref 82–98)
MONO#: 1.1 10*3/uL — ABNORMAL HIGH (ref 0.1–0.9)
MONO%: 6.1 % (ref 0.0–13.0)
NEUT%: 77.9 % (ref 40.0–80.0)
NEUTROS ABS: 14.7 10*3/uL — AB (ref 1.5–6.5)
Platelets: 201 10*3/uL (ref 145–400)
RBC: 3.37 10*6/uL — ABNORMAL LOW (ref 4.20–5.70)
RDW: 15.5 % (ref 11.1–15.7)
WBC: 18.8 10*3/uL — ABNORMAL HIGH (ref 4.0–10.0)

## 2014-05-02 LAB — CHCC SATELLITE - SMEAR

## 2014-05-02 LAB — RETICULOCYTES (CHCC)
ABS Retic: 143.6 10*3/uL (ref 19.0–186.0)
RBC.: 3.42 MIL/uL — ABNORMAL LOW (ref 4.22–5.81)
Retic Ct Pct: 4.2 % — ABNORMAL HIGH (ref 0.4–2.3)

## 2014-05-02 LAB — CMP (CANCER CENTER ONLY)
ALBUMIN: 2.7 g/dL — AB (ref 3.3–5.5)
ALK PHOS: 52 U/L (ref 26–84)
ALT(SGPT): 41 U/L (ref 10–47)
AST: 34 U/L (ref 11–38)
BUN: 20 mg/dL (ref 7–22)
CALCIUM: 8.4 mg/dL (ref 8.0–10.3)
CO2: 26 meq/L (ref 18–33)
Chloride: 100 mEq/L (ref 98–108)
Creat: 1.1 mg/dl (ref 0.6–1.2)
GLUCOSE: 92 mg/dL (ref 73–118)
POTASSIUM: 3.2 meq/L — AB (ref 3.3–4.7)
Sodium: 134 mEq/L (ref 128–145)
TOTAL PROTEIN: 8.3 g/dL — AB (ref 6.4–8.1)
Total Bilirubin: 0.6 mg/dl (ref 0.20–1.60)

## 2014-05-02 LAB — TECHNOLOGIST REVIEW CHCC SATELLITE

## 2014-05-02 NOTE — Progress Notes (Signed)
Hematology and Oncology Follow Up Visit  Scott Waters [id number] 17-Apr-1956 58 y.o. 05/02/2014   Principle Diagnosis:   Chronic immune thrombocytopenia  Current Therapy:    Status post IVIG x2 cycles  Decadron 40 mg by mouth daily x4 days  Nplate x1 dose     Interim History:  Scott Waters is back for followup. We saw him a week ago. His platelet count was back down to less than 6000. I had to limit him. I would ahead and did a bone marrow biopsy on him in the hospital. Thankfully, the bone marrow biopsy did not show any obvious bone marrow disorder. There is no myelodysplastic changes. There is no infiltrative process. He had no malignancy. He had increased megakaryocytes. This is all consistent with his immune Scott P.  He is home now. 11 the home after 3 days. His platelet count was going up nicely.  He is on Decadron right now. I will taper his Decadron dose down.  He's had no rashes. He's had no bleeding. He's had no fever. He's had no shortness of breath. He's had no leg swelling.  Medications: Current outpatient prescriptions:amLODipine-olmesartan (AZOR) 10-40 MG per tablet, Take 1 tablet by mouth daily., Disp: , Rfl: ;  dexamethasone (DECADRON) 4 MG tablet, Take 1 tablet (4 mg total) by mouth 3 (three) times daily., Disp: 60 tablet, Rfl: 1;  famciclovir (FAMVIR) 500 MG tablet, Take 0.5 tablets (250 mg total) by mouth daily., Disp: 10 tablet, Rfl: 0 fluticasone (FLONASE) 50 MCG/ACT nasal spray, Place 2 sprays into both nostrils daily as needed for allergies., Disp: , Rfl: ;  folic acid (FOLVITE) 1 MG tablet, Take 1 tablet (1 mg total) by mouth daily., Disp: 30 tablet, Rfl: 6;  Multiple Vitamin (MULTIVITAMIN WITH MINERALS) TABS tablet, Take 1 tablet by mouth daily., Disp: , Rfl: ;  pantoprazole (PROTONIX) 40 MG tablet, Take 1 tablet (40 mg total) by mouth daily., Disp: 30 tablet, Rfl: 0 simvastatin (ZOCOR) 40 MG tablet, Take 40 mg by mouth daily., Disp: , Rfl: ;   sulfamethoxazole-trimethoprim (BACTRIM DS) 800-160 MG per tablet, Take 1 tablet by mouth 3 (three) times a week., Disp: 6 tablet, Rfl: 0  Allergies: No Known Allergies  Past Medical History, Surgical history, Social history, and Family History were reviewed and updated.  Review of Systems: As above  Physical Exam:  height is _0  (1.727 m) and weight is 208 lb (94.348 kg). His oral temperature is 98.6 F (37 C). His blood pressure is 156/68 and his pulse is 88. His respiration is 18.   Well-developed and well-nourished woman. Lungs are clear. Skin exam shows no ecchymosis or petechia. Cardiac exam regular in rhythm. Abdomen is soft. There is no palpable liver or spleen. Back exam no tenderness over the spine. Joints are not swollen. Neurological exam is not focal.  Lab Results  Component Value Date   WBC 18.8* 05/02/2014   HGB 10.0* 05/02/2014   HCT 30.5* 05/02/2014   MCV 91 05/02/2014   PLT 201 05/02/2014     Chemistry      Component Value Date/Time   NA 134 05/02/2014 0852   NA 138 04/30/2014 0438   K 3.2* 05/02/2014 0852   K 4.1 04/30/2014 0438   CL 100 05/02/2014 0852   CL 104 04/30/2014 0438   CO2 26 05/02/2014 0852   CO2 26 04/30/2014 0438   BUN 20 05/02/2014 0852   BUN 23 04/30/2014 0438   CREATININE 1.1 05/02/2014 0852   CREATININE  1.09 04/30/2014 0438      Component Value Date/Time   CALCIUM 8.4 05/02/2014 0852   CALCIUM 8.1* 04/30/2014 0438   ALKPHOS 52 05/02/2014 0852   ALKPHOS 76 04/28/2014 0346   AST 34 05/02/2014 0852   AST 28 04/28/2014 0346   ALT 41 05/02/2014 0852   ALT 44 04/28/2014 0346   BILITOT 0.60 05/02/2014 0852   BILITOT 0.3 04/28/2014 0346         Impression and Plan: Scott Waters is a 58 year old gentleman with chronic immune thrombocytopenia. Again we gave him a second cycle of IVIG. I prolonged pulse Decadron. This does appear to be working.  I would does blood smear. His platelets are normal in range. There are fairly large. They are well granulated. White cells appear normal in  morphology and maturation. He has increased white cells secondary to steroid. He is no hypersegmented polys. There are no immature myeloid cells.  I will plan to get him back to see [place] in another couple weeks. I will try another cycle of pulse Decadron.   [name], MD 6/8/201510:02 AM

## 2014-05-02 NOTE — Progress Notes (Signed)
Discharge summary sent to payer through [facility]  

## 2014-05-02 NOTE — Telephone Encounter (Signed)
Pt is scheduled with you in Sept.

## 2014-05-02 NOTE — Telephone Encounter (Signed)
i agree with changing appt  As he is seeing d Ennever.

## 2014-05-02 NOTE — Telephone Encounter (Signed)
Pt got out of hosp on sat.  Pt states he is doing great and will fu w/ dr [name] in 2 wks. Pt would like to know if you feel its necessary for him to fu w/ you as well.  Pt states he doesn't feel he needs to, but if you insist, he will come in.

## 2014-05-03 ENCOUNTER — Ambulatory Visit: Payer: BC Managed Care – PPO | Admitting: Internal Medicine

## 2014-05-03 ENCOUNTER — Encounter: Payer: Self-pay | Admitting: *Deleted

## 2014-05-03 NOTE — Progress Notes (Signed)
Instructed pt that Dr [name] wants him to take the Decadron he prescribed. He is to take 4mg  tablets. He is to take 2 pills a day for 3 days, 1 pill a day for 3 days, 1/2 a pill a day for 5 days. Pt verbalized understanding of these instructions.

## 2014-05-03 NOTE — Telephone Encounter (Signed)
Pt states thank you and will see you in sept.

## 2014-05-04 LAB — CHROMOSOME ANALYSIS, BONE MARROW

## 2014-05-13 ENCOUNTER — Encounter: Payer: Self-pay | Admitting: Hematology & Oncology

## 2014-05-13 ENCOUNTER — Other Ambulatory Visit (HOSPITAL_BASED_OUTPATIENT_CLINIC_OR_DEPARTMENT_OTHER): Payer: BC Managed Care – PPO | Admitting: Lab

## 2014-05-13 ENCOUNTER — Ambulatory Visit (HOSPITAL_BASED_OUTPATIENT_CLINIC_OR_DEPARTMENT_OTHER): Payer: BC Managed Care – PPO | Admitting: Hematology & Oncology

## 2014-05-13 ENCOUNTER — Encounter: Payer: Self-pay | Admitting: *Deleted

## 2014-05-13 VITALS — BP 143/93 | HR 79 | Temp 98.2°F | Resp 18

## 2014-05-13 DIAGNOSIS — D72819 Decreased white blood cell count, unspecified: Secondary | ICD-10-CM

## 2014-05-13 DIAGNOSIS — D693 Immune thrombocytopenic purpura: Secondary | ICD-10-CM

## 2014-05-13 DIAGNOSIS — D696 Thrombocytopenia, unspecified: Secondary | ICD-10-CM

## 2014-05-13 LAB — CBC WITH DIFFERENTIAL (CANCER CENTER ONLY)
BASO#: 0 10*3/uL (ref 0.0–0.2)
BASO%: 0.4 % (ref 0.0–2.0)
EOS ABS: 0 10*3/uL (ref 0.0–0.5)
EOS%: 0.5 % (ref 0.0–7.0)
HCT: 31.1 % — ABNORMAL LOW (ref 38.7–49.9)
HEMOGLOBIN: 10.1 g/dL — AB (ref 13.0–17.1)
LYMPH#: 0.7 10*3/uL — AB (ref 0.9–3.3)
LYMPH%: 8.6 % — ABNORMAL LOW (ref 14.0–48.0)
MCH: 30 pg (ref 28.0–33.4)
MCHC: 32.5 g/dL (ref 32.0–35.9)
MCV: 92 fL (ref 82–98)
MONO#: 0.4 10*3/uL (ref 0.1–0.9)
MONO%: 5.8 % (ref 0.0–13.0)
NEUT#: 6.5 10*3/uL (ref 1.5–6.5)
NEUT%: 84.7 % — ABNORMAL HIGH (ref 40.0–80.0)
Platelets: 114 10*3/uL — ABNORMAL LOW (ref 145–400)
RBC: 3.37 10*6/uL — AB (ref 4.20–5.70)
RDW: 15.6 % (ref 11.1–15.7)
WBC: 7.6 10*3/uL (ref 4.0–10.0)

## 2014-05-13 LAB — CMP (CANCER CENTER ONLY)
ALK PHOS: 53 U/L (ref 26–84)
ALT(SGPT): 47 U/L (ref 10–47)
AST: 40 U/L — ABNORMAL HIGH (ref 11–38)
Albumin: 2.8 g/dL — ABNORMAL LOW (ref 3.3–5.5)
BUN, Bld: 17 mg/dL (ref 7–22)
CO2: 29 meq/L (ref 18–33)
CREATININE: 0.9 mg/dL (ref 0.6–1.2)
Calcium: 8.7 mg/dL (ref 8.0–10.3)
Chloride: 102 mEq/L (ref 98–108)
GLUCOSE: 116 mg/dL (ref 73–118)
Potassium: 3.9 mEq/L (ref 3.3–4.7)
SODIUM: 141 meq/L (ref 128–145)
Total Bilirubin: 0.7 mg/dl (ref 0.20–1.60)
Total Protein: 7.3 g/dL (ref 6.4–8.1)

## 2014-05-13 LAB — TECHNOLOGIST REVIEW CHCC SATELLITE

## 2014-05-13 LAB — RETICULOCYTES (CHCC)
ABS Retic: 91 10*3/uL (ref 19.0–186.0)
RBC.: 3.5 MIL/uL — AB (ref 4.22–5.81)
RETIC CT PCT: 2.6 % — AB (ref 0.4–2.3)

## 2014-05-13 LAB — HAPTOGLOBIN: HAPTOGLOBIN: 99 mg/dL (ref 45–215)

## 2014-05-13 LAB — CHCC SATELLITE - SMEAR

## 2014-05-13 MED ORDER — DEXAMETHASONE 4 MG PO TABS: ORAL_TABLET | ORAL | Status: DC

## 2014-05-13 MED ORDER — SULFAMETHOXAZOLE-TMP DS 800-160 MG PO TABS: 1.0000 | ORAL_TABLET | ORAL | Status: DC

## 2014-05-13 MED ORDER — FAMCICLOVIR 500 MG PO TABS: 250.0000 mg | ORAL_TABLET | Freq: Every day | ORAL | Status: DC

## 2014-05-16 ENCOUNTER — Other Ambulatory Visit: Payer: Self-pay | Admitting: Internal Medicine

## 2014-05-16 NOTE — Progress Notes (Signed)
Hematology and Oncology Follow Up Visit  Scott Waters [id number] August 24, 1956 58 y.o. 05/16/2014   Principle Diagnosis:   Chronic immune thrombocytopenia  Current Therapy:    Status post 2 cycles of IVIG  Pulse Decadron     Interim History:  Mr.  Waters is back for followup. He's doing okay. We will go ahead and start his next cycle of pulse Decadron. I gave him a written schedule as to how to take the Decadron. He'll take 40 mg a day for 4 days and then taper down.  He has had no bleeding or bruising. He's had no headache. He is on Bactrim and Famvir as prophylaxis.  Is no diarrhea. He's had no leg swelling. He's had no rashes. He's had no headache.  He is ready to go back to work. We gave him a letter to go back to work.  Medications: Current outpatient prescriptions:amLODipine-olmesartan (AZOR) 10-40 MG per tablet, Take 1 tablet by mouth daily., Disp: , Rfl: ;  famciclovir (FAMVIR) 500 MG tablet, Take 0.5 tablets (250 mg total) by mouth daily., Disp: 30 tablet, Rfl: 3;  fluticasone (FLONASE) 50 MCG/ACT nasal spray, Place 2 sprays into both nostrils daily as needed for allergies., Disp: , Rfl:  folic acid (FOLVITE) 1 MG tablet, Take 1 tablet (1 mg total) by mouth daily., Disp: 30 tablet, Rfl: 6;  Multiple Vitamin (MULTIVITAMIN WITH MINERALS) TABS tablet, Take 1 tablet by mouth daily., Disp: , Rfl: ;  pantoprazole (PROTONIX) 40 MG tablet, Take 1 tablet (40 mg total) by mouth daily., Disp: 30 tablet, Rfl: 0;  simvastatin (ZOCOR) 40 MG tablet, Take 40 mg by mouth daily., Disp: , Rfl:  dexamethasone (DECADRON) 4 MG tablet, Take as directed by my written instructions.  MUST take WITH food!!!!, Disp: 100 tablet, Rfl: 2;  sulfamethoxazole-trimethoprim (BACTRIM DS) 800-160 MG per tablet, Take 1 tablet by mouth 3 (three) times a week., Disp: 30 tablet, Rfl: 3  Allergies: No Known Allergies  Past Medical History, Surgical history, Social history, and Family History were reviewed and  updated.  Review of Systems: As above  Physical Exam:  oral temperature is 98.2 F (36.8 C). His blood pressure is 143/93 and his pulse is 79. His respiration is 18.   Well-developed well-nourished African American gentleman . Lungs are clear. Oral exam shows no petechia. Ocular exam shows no scleral icterus. He has no palpable thyroid on the neck. Lungs are clear. Cardiac exam regular rate and rhythm. There are no murmurs. Abdomen soft. There is no fluid wave. There is no palpable liver or spleen tip. There is no palpable abdominal mass. Back exam no tenderness over the spine ribs or hips. Extremities shows no clubbing cyanosis or edema. Skin exam no rashes. Neurological exam is non-focal.  Lab Results  Component Value Date   WBC 7.6 05/13/2014   HGB 10.1* 05/13/2014   HCT 31.1* 05/13/2014   MCV 92 05/13/2014   PLT 114* 05/13/2014     Chemistry      Component Value Date/Time   NA 141 05/13/2014 1141   NA 138 04/30/2014 0438   K 3.9 05/13/2014 1141   K 4.1 04/30/2014 0438   CL 102 05/13/2014 1141   CL 104 04/30/2014 0438   CO2 29 05/13/2014 1141   CO2 26 04/30/2014 0438   BUN 17 05/13/2014 1141   BUN 23 04/30/2014 0438   CREATININE 0.9 05/13/2014 1141   CREATININE 1.09 04/30/2014 0438      Component Value Date/Time   CALCIUM 8.7  05/13/2014 1141   CALCIUM 8.1* 04/30/2014 0438   ALKPHOS 53 05/13/2014 1141   ALKPHOS 76 04/28/2014 0346   AST 40* 05/13/2014 1141   AST 28 04/28/2014 0346   ALT 47 05/13/2014 1141   ALT 44 04/28/2014 0346   BILITOT 0.70 05/13/2014 1141   BILITOT 0.3 04/28/2014 0346         Impression and Plan: Scott Waters is a 57 year old gentleman with immune thrombocytopenia. His platelet count is coming down slowly. As such, we will go ahead and start another cycle of the Decadron. I think this is very reasonable. I do not think he needs any IVIG.  We will plan to get back to see [place] in another 2 or 3 weeks.  Again, I wrote out the schedule for the Decadron. He does understand is  completely.   [name], MD 6/22/20157:40 AM

## 2014-05-17 NOTE — Telephone Encounter (Signed)
Sent to the pharmacy by e-scribe. 

## 2014-05-23 ENCOUNTER — Other Ambulatory Visit: Payer: Self-pay | Admitting: *Deleted

## 2014-05-23 DIAGNOSIS — D72819 Decreased white blood cell count, unspecified: Secondary | ICD-10-CM

## 2014-05-23 MED ORDER — PANTOPRAZOLE SODIUM 40 MG PO TBEC: 40.0000 mg | DELAYED_RELEASE_TABLET | Freq: Every day | ORAL | Status: DC

## 2014-05-23 MED ORDER — PANTOPRAZOLE SODIUM 40 MG PO TBEC: 40.0000 mg | DELAYED_RELEASE_TABLET | Freq: Every day | ORAL | Status: AC

## 2014-06-02 ENCOUNTER — Ambulatory Visit (HOSPITAL_BASED_OUTPATIENT_CLINIC_OR_DEPARTMENT_OTHER): Payer: BC Managed Care – PPO | Admitting: Family

## 2014-06-02 ENCOUNTER — Other Ambulatory Visit (HOSPITAL_BASED_OUTPATIENT_CLINIC_OR_DEPARTMENT_OTHER): Payer: BC Managed Care – PPO | Admitting: Lab

## 2014-06-02 ENCOUNTER — Ambulatory Visit (HOSPITAL_BASED_OUTPATIENT_CLINIC_OR_DEPARTMENT_OTHER): Payer: BC Managed Care – PPO

## 2014-06-02 ENCOUNTER — Encounter: Payer: Self-pay | Admitting: Family

## 2014-06-02 VITALS — BP 130/73 | HR 85 | Temp 98.1°F | Resp 18 | Ht 68.0 in | Wt 209.0 lb

## 2014-06-02 DIAGNOSIS — D693 Immune thrombocytopenic purpura: Secondary | ICD-10-CM

## 2014-06-02 LAB — CBC WITH DIFFERENTIAL (CANCER CENTER ONLY)
BASO#: 0 10*3/uL (ref 0.0–0.2)
BASO%: 0.8 % (ref 0.0–2.0)
EOS ABS: 0.1 10*3/uL (ref 0.0–0.5)
EOS%: 2 % (ref 0.0–7.0)
HCT: 31.8 % — ABNORMAL LOW (ref 38.7–49.9)
HEMOGLOBIN: 10.1 g/dL — AB (ref 13.0–17.1)
LYMPH#: 1.1 10*3/uL (ref 0.9–3.3)
LYMPH%: 28.1 % (ref 14.0–48.0)
MCH: 28.7 pg (ref 28.0–33.4)
MCHC: 31.8 g/dL — ABNORMAL LOW (ref 32.0–35.9)
MCV: 90 fL (ref 82–98)
MONO#: 0.5 10*3/uL (ref 0.1–0.9)
MONO%: 12.3 % (ref 0.0–13.0)
NEUT#: 2.3 10*3/uL (ref 1.5–6.5)
NEUT%: 56.8 % (ref 40.0–80.0)
RBC: 3.52 10*6/uL — ABNORMAL LOW (ref 4.20–5.70)
RDW: 16.3 % — ABNORMAL HIGH (ref 11.1–15.7)
WBC: 4 10*3/uL (ref 4.0–10.0)

## 2014-06-02 LAB — CMP (CANCER CENTER ONLY)
ALBUMIN: 3.1 g/dL — AB (ref 3.3–5.5)
ALT(SGPT): 62 U/L — ABNORMAL HIGH (ref 10–47)
AST: 47 U/L — AB (ref 11–38)
Alkaline Phosphatase: 65 U/L (ref 26–84)
BUN, Bld: 15 mg/dL (ref 7–22)
CHLORIDE: 108 meq/L (ref 98–108)
CO2: 26 mEq/L (ref 18–33)
Calcium: 8.3 mg/dL (ref 8.0–10.3)
Creat: 1.2 mg/dl (ref 0.6–1.2)
Glucose, Bld: 103 mg/dL (ref 73–118)
POTASSIUM: 3.2 meq/L — AB (ref 3.3–4.7)
Sodium: 145 mEq/L (ref 128–145)
TOTAL PROTEIN: 6.4 g/dL (ref 6.4–8.1)
Total Bilirubin: 0.5 mg/dl (ref 0.20–1.60)

## 2014-06-02 LAB — CHCC SATELLITE - SMEAR

## 2014-06-02 MED ORDER — POTASSIUM CHLORIDE CRYS ER 20 MEQ PO TBCR: 20.0000 meq | EXTENDED_RELEASE_TABLET | Freq: Every day | ORAL | Status: AC

## 2014-06-02 MED ORDER — ROMIPLOSTIM 250 MCG ~~LOC~~ SOLR
2.0000 ug/kg | Freq: Once | SUBCUTANEOUS | Status: AC
  Administered 2014-06-02: 190 ug via SUBCUTANEOUS
  Filled 2014-06-02: qty 0.38

## 2014-06-02 NOTE — Patient Instructions (Signed)
Romiplostim injection What is this medicine? ROMIPLOSTIM (roe mi PLOE stim) helps your body make more platelets. This medicine is used to treat low platelets caused by chronic idiopathic thrombocytopenic purpura (ITP). This medicine may be used for other purposes; ask your health care provider or pharmacist if you have questions. COMMON BRAND NAME(S): Nplate What should I tell my health care provider before I take this medicine? They need to know if you have any of these conditions: -cancer or myelodysplastic syndrome -low blood counts, like low white cell, platelet, or red cell counts -take medicines that treat or prevent blood clots -an unusual or allergic reaction to romiplostim, mannitol, other medicines, foods, dyes, or preservatives -pregnant or trying to get pregnant -breast-feeding How should I use this medicine? This medicine is for injection under the skin. It is given by a health care professional in a hospital or clinic setting. A special MedGuide will be given to you before your injection. Read this information carefully each time. Talk to your pediatrician regarding the use of this medicine in children. Special care may be needed. Overdosage: If you think you have taken too much of this medicine contact a poison control center or emergency room at once. NOTE: This medicine is only for you. Do not share this medicine with others. What if I miss a dose? It is important not to miss your dose. Call your doctor or health care professional if you are unable to keep an appointment. What may interact with this medicine? Interactions are not expected. This list may not describe all possible interactions. Give your health care provider a list of all the medicines, herbs, non-prescription drugs, or dietary supplements you use. Also tell them if you smoke, drink alcohol, or use illegal drugs. Some items may interact with your medicine. What should I watch for while using this  medicine? Your condition will be monitored carefully while you are receiving this medicine. Visit your prescriber or health care professional for regular checks on your progress and for the needed blood tests. It is important to keep all appointments. What side effects may I notice from receiving this medicine? Side effects that you should report to your doctor or health care professional as soon as possible: -allergic reactions like skin rash, itching or hives, swelling of the face, lips, or tongue -shortness of breath, chest pain, swelling in a leg -unusual bleeding or bruising Side effects that usually do not require medical attention (report to your doctor or health care professional if they continue or are bothersome): -dizziness -headache -muscle aches -pain in arms and legs -stomach pain -trouble sleeping This list may not describe all possible side effects. Call your doctor for medical advice about side effects. You may report side effects to FDA at 1-800-FDA-1088. Where should I keep my medicine? This drug is given in a hospital or clinic and will not be stored at home. NOTE: This sheet is a summary. It may not cover all possible information. If you have questions about this medicine, talk to your doctor, pharmacist, or health care provider.  2015, Elsevier/Gold Standard. (2008-07-11 15:13:04)  

## 2014-06-02 NOTE — Progress Notes (Signed)
[facility]  Telephone:(336) [phone] Fax:(336) [phone]  ID: Scott Waters OB: 27-Feb-1956 MR#: [id number] IEP#:[id number] Patient Care Team: Burnis Medin, MD as PCP - General Irene Shipper, MD as Attending Physician (Gastroenterology)  DIAGNOSIS: Chronic immune thrombocytopenia  INTERVAL HISTORY: Scott Waters is a very pleasant African American male back today for his 3 week follow-up. He's doing well but states that he is a bit tired at times. He started working at [employer] 3 weeks ago.He finished his last Decadron taper as directed. He has had no bleeding or bruising. He's had no headache. He is on Bactrim and Famvir as prophylaxis. He denies dizziness, blurred vision, N/V, rash, cough, SOB, chest pain, palpitations, abdominal pain, constipation, diarrhea, problems urinating, blood in stool or urine. He states that his appetite is good.   CURRENT THERAPY: Status post 2 cycles of  IVIG Pulse Decadron  REVIEW OF SYSTEMS: All other 10 point review of systems negative except for what is mentioned above.   PAST MEDICAL HISTORY: Past Medical History  Diagnosis Date  . Seasonal allergies   . Hypertension   . Hyperlipidemia   . Hx of colonic polyps 2007  . Gout 04/27/2011    One episode of joint pain at [facility]   Presumed    . Abnormal LFTs 04/27/2011  . Thrombocytopenia 04/15/2014    admitted for IVIG  . GERD (gastroesophageal reflux disease)     "mild"  . Osteoarthritis   . Arthritis     "knees" (04/16/2014)  . ITP (idiopathic thrombocytopenic purpura) 04/30/2014   PAST SURGICAL HISTORY: Past Surgical History  Procedure Laterality Date  . Polypectomy    . Multiple tooth extractions  2014    "took out all the top teeth"  . Tonsillectomy and adenoidectomy  1973  . Laceration repair Right ~ 1983    "inner knee; steel tubing fell on me"  . Shoulder arthroscopy Left 08/2001    "removed bone spur"   FAMILY HISTORY Family History  Problem Relation Age of Onset  . Colon  cancer Father 42    Died at 29  . Hypertension Mother   . Diabetes Mother   . Kidney disease Mother     diailysis from dm   . Stroke     GYNECOLOGIC HISTORY:  No LMP for male patient.   SOCIAL HISTORY:  History   Social History  . Marital Status: Married    Spouse Name: N/A    Number of Children: N/A  . Years of Education: N/A   Occupational History  . Not on file.   Social History Main Topics  . Smoking status: Former Smoker -- 1.00 packs/day for 33 years    Types: Cigarettes    Start date: 10/02/1970    Quit date: 01/02/2003  . Smokeless tobacco: Former Systems developer    Quit date: 10/03/2003     Comment: quit all tobacco  11 years ago  . Alcohol Use: 16.8 oz/week    14 Cans of beer, 14 Shots of liquor per week     Comment: 04/16/2014 "2, 12oz beers/day plus 2 shots liquor/day"  . Drug Use: Yes     Comment: "last drug use was in ~ 1995; marijuana"  . Sexual Activity: Yes   Other Topics Concern  . 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 Smoking status: Former Smoker -- 1.00 packs/day for 33 years    Types: Cigarettes    Start date: 10/02/1970    Quit date: 01/02/2003  . Smokeless tobacco: Former Systems developer    Quit date: 10/03/2003     Comment: quit all tobacco  11 years ago  . Alcohol Use: 16.8 oz/week    14 Cans of beer, 14 Shots of liquor per week     Comment: 04/16/2014 "2, 12oz beers/day plus 2 shots liquor/day"   Colonoscopy: PAP: Bone density: Lipid panel:  No Known Allergies  Current Outpatient Prescriptions  Medication Sig Dispense Refill  . amLODipine-olmesartan (AZOR) 10-40 MG per tablet Take 1 tablet by mouth daily.      Marland Kitchen dexamethasone (DECADRON) 4 MG tablet Take as directed by my written instructions.  MUST  take WITH food!!!!  100 tablet  2  . famciclovir (FAMVIR) 500 MG tablet Take 0.5 tablets (250 mg total) by mouth daily.  30 tablet  3  . fluticasone (FLONASE) 50 MCG/ACT nasal spray Place 2 sprays into both nostrils daily as needed for allergies.      . folic acid (FOLVITE) 1 MG tablet Take 1 tablet (1 mg total) by mouth daily.  30 tablet  6  . Multiple Vitamin (MULTIVITAMIN WITH MINERALS) TABS tablet Take 1 tablet by mouth daily.      . pantoprazole (PROTONIX) 40 MG tablet Take 1 tablet (40 mg total) by mouth daily.  30 tablet  6  . simvastatin (ZOCOR) 40 MG tablet Take 40 mg by mouth daily.      Marland Kitchen sulfamethoxazole-trimethoprim (BACTRIM DS) 800-160 MG per tablet Take 1 tablet by mouth 3 (three) times a week.  30 tablet  3   No current facility-administered medications for this visit.   Facility-Administered Medications Ordered in Other Visits  Medication Dose Route Frequency Provider Last Rate Last Dose  . romiPLOStim (NPLATE) injection 190 mcg  2 mcg/kg (Order-Specific) Subcutaneous Once [name], MD       OBJECTIVE: Filed Vitals:   06/02/14 1438  BP: 130/73  Pulse: 85  Temp: 98.1 F (36.7 C)  Resp: 18   Body mass index is 31.79 kg/(m^2). ECOG FS:0 - Asymptomatic Ocular: Sclerae unicteric, pupils equal, round and reactive to light Ear-nose-throat: Oropharynx clear, dentition fair Lymphatic: No cervical or supraclavicular adenopathy Lungs no rales or rhonchi, good excursion bilaterally Heart regular rate and rhythm, no murmur appreciated Abd soft, nontender, positive bowel sounds MSK no focal spinal tenderness, no joint edema Neuro: non-focal, well-oriented, appropriate affect  LAB RESULTS: CMP     Component Value Date/Time   NA 145 06/02/2014 1405   NA 138 04/30/2014 0438   K 3.2* 06/02/2014 1405   K 4.1 04/30/2014 0438   CL 108 06/02/2014 1405   CL 104 04/30/2014 0438   CO2 26 06/02/2014 1405   CO2 26 04/30/2014 0438   GLUCOSE 103 06/02/2014 1405   GLUCOSE 136* 04/30/2014 0438    BUN 15 06/02/2014 1405   BUN 23 04/30/2014 0438   CREATININE 1.2 06/02/2014 1405   CREATININE 1.09 04/30/2014 0438   CALCIUM 8.3 06/02/2014 1405   CALCIUM 8.1* 04/30/2014 0438   PROT 6.4 06/02/2014 1405   PROT 7.6 04/28/2014 0346   ALBUMIN 3.3* 04/28/2014 0346   AST 47* 06/02/2014 1405   AST 28 04/28/2014 0346  ALT 62* 06/02/2014 1405   ALT 44 04/28/2014 0346   ALKPHOS 65 06/02/2014 1405   ALKPHOS 76 04/28/2014 0346   BILITOT 0.50 06/02/2014 1405   BILITOT 0.3 04/28/2014 0346   GFRNONAA 72* 04/30/2014 0438   GFRAA 83* 04/30/2014 0438   No results found for this basename: SPEP, UPEP,  kappa and lambda light chains   Lab Results  Component Value Date   WBC 4.0 06/02/2014   NEUTROABS 2.3 06/02/2014   HGB 10.1* 06/02/2014   HCT 31.8* 06/02/2014   MCV 90 06/02/2014   PLT 67 Platelet count consistent in citrate* 06/02/2014   No results found for this basename: LABCA2   No components found with this basename: [id number]   No results found for this basename: INR,  in the last 168 hours  STUDIES: No results found.  ASSESSMENT/PLAN: Scott Waters is a 58 year old gentleman with immune thrombocytopenia. His platelet count has come down quite a bit since his last visit. He will receive Nplate today. We will also schedule him for IVIG next Thursday and Friday.  We will see him again in 3 weeks for labs and follow-up appointment.   We discussed his CBC and CMP in detail. We will also send a prescription to the pharmacy for KDUR 20 meq x 10 days.   All questions were answered and he is in agreement with the plan. He knows to call here with any new questions or concerns and to go to the ED in the event of an emergency. We can certainly see him sooner if need be.  The patient was discussed with and also seen by Dr. [name].    Eliezer Bottom, NP 06/02/2014 4:01 PM  Addendum by Dr. [name] as follows:   I saw and examined Scott Waters. I reviewed both assessment.  I think that we probably need to try IVIG again. I see no option  would be Rituxan or getting his spleen removed.  He did have a good response to IVIG in the hospital. As such, possibly a second dose will allow and have a more prolonged platelet response.  When I examined him, I cannot find anything unusual. He does not have a be explained. Is no hepatomegaly. There is no lymphadenopathy.  I do want to give him a dose of Nplate. His platelet count goes down by 50% each time we see him.  At some point, we may have to consider a bone marrow biopsy on him. This would be a very consider him for a splenectomy.  He wants to keep working. I don't want to interfere with this if I can.  We will set him up for the IVIG next week. We will give him 2 days worth.  I did look at his blood smear. I do not see anything unusual. White cells were mature. There were no immature or nucleated red cells. Platelets were well granulated. He had a few large platelets.  I spent a good half hour with him today. He understands the situation. He agrees to have the IVIG again.

## 2014-06-03 ENCOUNTER — Telehealth: Payer: Self-pay | Admitting: Hematology & Oncology

## 2014-06-03 MED ORDER — PEGFILGRASTIM INJECTION 6 MG/0.6ML
SUBCUTANEOUS | Status: AC
  Filled 2014-06-03: qty 0.6

## 2014-06-03 NOTE — Telephone Encounter (Addendum)
Pt has been APPROVED for [place]   Confirmation Thank you for completing your registration online. Your card is now ready to use. Your confirmation number is [id number]. Print this page to save it for your records. Simply hand your Nplate FIRST STEP Program card over to the office staff when you arrive for your Nplate (romiplostim) treatment. The card can then be used to help cover the cost of your deductible, co-insurance, and/or co-payment for your Nplate.   Phone: [phone]

## 2014-06-03 NOTE — Telephone Encounter (Signed)
[place]  However, I spoke w Apolonio Schneiders and she advised predetermination can be done. I faxed over the NPLATE form today to [phone].   P: [phone]  ITP (idiopathic thrombocytopenic purpura) - Primary 287.31   J2796 PR ROMIPLOSTIM INJECTION

## 2014-06-06 ENCOUNTER — Telehealth: Payer: Self-pay | Admitting: Hematology & Oncology

## 2014-06-06 NOTE — Telephone Encounter (Signed)
[employer] [place] PRIME [employer]ERAPEUTICS APPROVAL  Auth Nbr: [id number] Status: Approved Dates: 06/02/2014-07/27/2014 CPT: J2796 - NPLATE  P: [phone] F: [phone]   COPY SCANNED

## 2014-06-09 ENCOUNTER — Ambulatory Visit (HOSPITAL_BASED_OUTPATIENT_CLINIC_OR_DEPARTMENT_OTHER): Payer: BC Managed Care – PPO

## 2014-06-09 ENCOUNTER — Telehealth: Payer: Self-pay | Admitting: Hematology & Oncology

## 2014-06-09 ENCOUNTER — Other Ambulatory Visit (HOSPITAL_BASED_OUTPATIENT_CLINIC_OR_DEPARTMENT_OTHER): Payer: BC Managed Care – PPO | Admitting: Lab

## 2014-06-09 VITALS — BP 122/80 | HR 78 | Temp 98.0°F | Resp 16

## 2014-06-09 DIAGNOSIS — D693 Immune thrombocytopenic purpura: Secondary | ICD-10-CM

## 2014-06-09 LAB — CBC WITH DIFFERENTIAL (CANCER CENTER ONLY)
BASO#: 0 10*3/uL (ref 0.0–0.2)
BASO%: 0.8 % (ref 0.0–2.0)
EOS%: 2.5 % (ref 0.0–7.0)
Eosinophils Absolute: 0.1 10*3/uL (ref 0.0–0.5)
HEMATOCRIT: 34.3 % — AB (ref 38.7–49.9)
HEMOGLOBIN: 11.1 g/dL — AB (ref 13.0–17.1)
LYMPH#: 0.8 10*3/uL — ABNORMAL LOW (ref 0.9–3.3)
LYMPH%: 19.3 % (ref 14.0–48.0)
MCH: 28.8 pg (ref 28.0–33.4)
MCHC: 32.4 g/dL (ref 32.0–35.9)
MCV: 89 fL (ref 82–98)
MONO#: 0.5 10*3/uL (ref 0.1–0.9)
MONO%: 13 % (ref 0.0–13.0)
NEUT#: 2.5 10*3/uL (ref 1.5–6.5)
NEUT%: 64.4 % (ref 40.0–80.0)
Platelets: 350 10*3/uL (ref 145–400)
RBC: 3.86 10*6/uL — ABNORMAL LOW (ref 4.20–5.70)
RDW: 15.9 % — ABNORMAL HIGH (ref 11.1–15.7)
WBC: 3.9 10*3/uL — ABNORMAL LOW (ref 4.0–10.0)

## 2014-06-09 MED ORDER — ACETAMINOPHEN 325 MG PO TABS
ORAL_TABLET | ORAL | Status: AC
  Filled 2014-06-09: qty 2

## 2014-06-09 MED ORDER — DIPHENHYDRAMINE HCL 25 MG PO CAPS
ORAL_CAPSULE | ORAL | Status: AC
  Filled 2014-06-09: qty 1

## 2014-06-09 MED ORDER — DIPHENHYDRAMINE HCL 25 MG PO CAPS
25.0000 mg | ORAL_CAPSULE | Freq: Once | ORAL | Status: AC
  Administered 2014-06-09: 25 mg via ORAL

## 2014-06-09 MED ORDER — ACETAMINOPHEN 325 MG PO TABS
650.0000 mg | ORAL_TABLET | Freq: Once | ORAL | Status: AC
Start: 2014-06-09 — End: 2014-06-09
  Administered 2014-06-09: 650 mg via ORAL

## 2014-06-09 MED ORDER — IMMUNE GLOBULIN (HUMAN) 10 GM/100ML IV SOLN
1.0000 g/kg | INTRAVENOUS | Status: DC
  Administered 2014-06-09: 95 g via INTRAVENOUS
  Filled 2014-06-09: qty 950

## 2014-06-09 MED ORDER — SODIUM CHLORIDE 0.9 % IV SOLN
Freq: Once | INTRAVENOUS | Status: AC
  Administered 2014-06-09: 09:00:00 via INTRAVENOUS

## 2014-06-09 NOTE — Telephone Encounter (Signed)
[employer] [place] NPR   J3490 PR DRUGS UNCLASSIFIED INJECTION  Q0163 PR DIPHENHYDRAMINE HCL 50MG   J1568 OCTAGAM INJECTION 06/09/2014 - Predetermination done  ITP (idiopathic thrombocytopenic purpura) - Primary 287.31  [facility] 11/25/1997 - I spoke w Scott Waters on 06/09/2014  Ref: [id number]

## 2014-06-09 NOTE — Patient Instructions (Signed)

## 2014-06-10 ENCOUNTER — Ambulatory Visit (HOSPITAL_BASED_OUTPATIENT_CLINIC_OR_DEPARTMENT_OTHER): Payer: BC Managed Care – PPO

## 2014-06-10 VITALS — BP 140/80 | HR 78 | Temp 98.0°F | Resp 16

## 2014-06-10 DIAGNOSIS — D693 Immune thrombocytopenic purpura: Secondary | ICD-10-CM

## 2014-06-10 MED ORDER — DIPHENHYDRAMINE HCL 25 MG PO CAPS
25.0000 mg | ORAL_CAPSULE | Freq: Once | ORAL | Status: AC
  Administered 2014-06-10: 25 mg via ORAL

## 2014-06-10 MED ORDER — ACETAMINOPHEN 325 MG PO TABS
650.0000 mg | ORAL_TABLET | Freq: Once | ORAL | Status: AC
  Administered 2014-06-10: 650 mg via ORAL

## 2014-06-10 MED ORDER — IMMUNE GLOBULIN (HUMAN) 10 GM/100ML IV SOLN
1.0000 g/kg | INTRAVENOUS | Status: AC
  Administered 2014-06-10: 95 g via INTRAVENOUS
  Filled 2014-06-10: qty 950

## 2014-06-10 MED ORDER — ACETAMINOPHEN 325 MG PO TABS
ORAL_TABLET | ORAL | Status: AC
  Filled 2014-06-10: qty 2

## 2014-06-10 MED ORDER — DIPHENHYDRAMINE HCL 25 MG PO CAPS
ORAL_CAPSULE | ORAL | Status: AC
  Filled 2014-06-10: qty 1

## 2014-06-10 MED ORDER — SODIUM CHLORIDE 0.9 % IV SOLN
Freq: Once | INTRAVENOUS | Status: AC
  Administered 2014-06-10: 12:00:00 via INTRAVENOUS

## 2014-06-10 NOTE — Patient Instructions (Signed)

## 2014-06-12 ENCOUNTER — Other Ambulatory Visit: Payer: Self-pay | Admitting: Internal Medicine

## 2014-06-14 ENCOUNTER — Telehealth: Payer: Self-pay | Admitting: Hematology & Oncology

## 2014-06-14 NOTE — Telephone Encounter (Signed)
°  [employer] [place] PRIME [employer]ICS [place]PROVAL   Auth Nbr: [id number]  Status: Approved  Dates: 06/09/2014-09/08/2014  CPT: M0768 - OCTAGAM   P: [phone]  F: [phone]    COPY SCANNED

## 2014-06-14 NOTE — Telephone Encounter (Signed)
Should patient continue this medication?  Please review last note.

## 2014-06-14 NOTE — Telephone Encounter (Signed)
Yes   Last potassium was 3.2 through hematology  Ask him if  He had missed some doses   We may consider increase dose

## 2014-06-16 NOTE — Telephone Encounter (Signed)
Patient states he has been taking every day.  Will send in to the pharmacy.  Pt advised that he may need to increase this medication.  Will call him back when discussed with WP.

## 2014-06-19 NOTE — Telephone Encounter (Signed)
Increase to 3 10 meq per day ( total 30 eq per day) rx 90 -er month Then make sure he has a repeat K level in about 3 weeks ( i think  A panel CMP ) is scheduled viat hematology in 2 weeks   8/3  Fu depending on results

## 2014-06-20 ENCOUNTER — Other Ambulatory Visit: Payer: Self-pay | Admitting: Family Medicine

## 2014-06-20 DIAGNOSIS — E876 Hypokalemia: Secondary | ICD-10-CM

## 2014-06-20 NOTE — Telephone Encounter (Signed)
Left a message on home/cell for the pt to return my call.

## 2014-06-27 ENCOUNTER — Other Ambulatory Visit: Payer: Self-pay | Admitting: Family

## 2014-06-27 ENCOUNTER — Other Ambulatory Visit (HOSPITAL_BASED_OUTPATIENT_CLINIC_OR_DEPARTMENT_OTHER): Payer: BC Managed Care – PPO | Admitting: Lab

## 2014-06-27 ENCOUNTER — Ambulatory Visit (HOSPITAL_BASED_OUTPATIENT_CLINIC_OR_DEPARTMENT_OTHER): Payer: BC Managed Care – PPO

## 2014-06-27 ENCOUNTER — Ambulatory Visit (HOSPITAL_BASED_OUTPATIENT_CLINIC_OR_DEPARTMENT_OTHER): Payer: BC Managed Care – PPO | Admitting: Family

## 2014-06-27 ENCOUNTER — Encounter: Payer: Self-pay | Admitting: Family

## 2014-06-27 VITALS — BP 134/77 | HR 76 | Temp 98.0°F | Resp 18 | Ht 68.0 in | Wt 215.0 lb

## 2014-06-27 DIAGNOSIS — D693 Immune thrombocytopenic purpura: Secondary | ICD-10-CM

## 2014-06-27 DIAGNOSIS — D696 Thrombocytopenia, unspecified: Secondary | ICD-10-CM

## 2014-06-27 LAB — CBC WITH DIFFERENTIAL (CANCER CENTER ONLY)
BASO#: 0 10*3/uL (ref 0.0–0.2)
BASO%: 0.6 % (ref 0.0–2.0)
EOS%: 3.2 % (ref 0.0–7.0)
Eosinophils Absolute: 0.2 10*3/uL (ref 0.0–0.5)
HCT: 32.9 % — ABNORMAL LOW (ref 38.7–49.9)
HEMOGLOBIN: 10.9 g/dL — AB (ref 13.0–17.1)
LYMPH#: 0.9 10*3/uL (ref 0.9–3.3)
LYMPH%: 18.9 % (ref 14.0–48.0)
MCH: 28.5 pg (ref 28.0–33.4)
MCHC: 33.1 g/dL (ref 32.0–35.9)
MCV: 86 fL (ref 82–98)
MONO#: 0.7 10*3/uL (ref 0.1–0.9)
MONO%: 13.3 % — ABNORMAL HIGH (ref 0.0–13.0)
NEUT%: 64 % (ref 40.0–80.0)
NEUTROS ABS: 3.2 10*3/uL (ref 1.5–6.5)
Platelets: <6 10*3/uL — CL (ref 145–400)
RBC: 3.83 10*6/uL — AB (ref 4.20–5.70)
RDW: 16.2 % — ABNORMAL HIGH (ref 11.1–15.7)
WBC: 5 10*3/uL (ref 4.0–10.0)

## 2014-06-27 LAB — CMP (CANCER CENTER ONLY)
ALT(SGPT): 29 U/L (ref 10–47)
AST: 41 U/L — ABNORMAL HIGH (ref 11–38)
Albumin: 3.2 g/dL — ABNORMAL LOW (ref 3.3–5.5)
Alkaline Phosphatase: 58 U/L (ref 26–84)
BILIRUBIN TOTAL: 0.6 mg/dL (ref 0.20–1.60)
BUN, Bld: 10 mg/dL (ref 7–22)
CALCIUM: 8.7 mg/dL (ref 8.0–10.3)
CO2: 27 meq/L (ref 18–33)
Chloride: 101 mEq/L (ref 98–108)
Creat: 1 mg/dl (ref 0.6–1.2)
GLUCOSE: 130 mg/dL — AB (ref 73–118)
Potassium: 3.2 mEq/L — ABNORMAL LOW (ref 3.3–4.7)
SODIUM: 138 meq/L (ref 128–145)
TOTAL PROTEIN: 7.3 g/dL (ref 6.4–8.1)

## 2014-06-27 LAB — CHCC SATELLITE - SMEAR

## 2014-06-27 LAB — TECHNOLOGIST REVIEW CHCC SATELLITE

## 2014-06-27 MED ORDER — ROMIPLOSTIM 250 MCG ~~LOC~~ SOLR
2.0000 ug/kg | Freq: Once | SUBCUTANEOUS | Status: AC
  Administered 2014-06-27: 195 ug via SUBCUTANEOUS
  Filled 2014-06-27: qty 0.39

## 2014-06-27 NOTE — Progress Notes (Signed)
[place]  [phone] [phone]  ID: Scott Waters OB: 10/04/1956 MR#: [id number] Patient Care Team: Burnis Medin, MD as PCP - General Irene Shipper, MD as Attending Physician (Gastroenterology)  DIAGNOSIS: Chronic immune thrombocytopenia  INTERVAL HISTORY: Scott Waters is a very pleasant African American male back today for a follow-up. He states that he is feeling good. His Platelet count today is 6,000. He has had no bleeding. He denies fever, chills, rash, headache, SOB, chest pain, palpitations, abdominal pain, constipation, diarrhea, blood in urine or stool. He denies any swelling, tenderness, numbness or tingling in his extremities. He states that his appetite is good.   CURRENT TREATMENT: Status post 2 cycles of IVIG Pulse Decadron NPLATE injections as indicated  REVIEW OF SYSTEMS: All other 10 point review of systems is negative except for those issues mentioned above.   PAST MEDICAL HISTORY: Past Medical History  Diagnosis Date  . Seasonal allergies   . Hypertension   . Hyperlipidemia   . Hx of colonic polyps 2007  . Gout 04/27/2011    One episode of joint pain at [facility]   Presumed    . Abnormal LFTs 04/27/2011  . Thrombocytopenia 04/15/2014    admitted for IVIG  . GERD (gastroesophageal reflux disease)     "mild"  . Osteoarthritis   . Arthritis     "knees" (04/16/2014)  . ITP (idiopathic thrombocytopenic purpura) 04/30/2014   PAST SURGICAL HISTORY: Past Surgical History  Procedure Laterality Date  . Polypectomy    . Multiple tooth extractions  2014    "took out all the top teeth"  . Tonsillectomy and adenoidectomy  1973  . Laceration repair Right ~ 1983    "inner knee; steel tubing fell on me"  . Shoulder arthroscopy Left 08/2001    "removed bone spur"   FAMILY HISTORY Family History  Problem Relation Age of Onset  . Colon cancer Father 53    Died at 63  . Hypertension Mother   . Diabetes Mother   . Kidney  disease Mother     diailysis from dm   . Stroke     GYNECOLOGIC HISTORY:  No LMP for male patient.   SOCIAL HISTORY:  History   Social History  . Marital Status: Married    Spouse Name: N/A    Number of Children: N/A  . Years of Education: N/A   Occupational History  . Not on file.   Social History Main Topics  . Smoking status: Former Smoker -- 1.00 packs/day for 33 years    Types: Cigarettes    Start date: 10/02/1970    Quit date: 01/02/2003  . Smokeless tobacco: Former Systems developer    Quit date: 10/03/2003     Comment: quit all tobacco  11 years ago  . Alcohol Use: 16.8 oz/week    14 Cans of beer, 14 Shots of liquor per week     Comment: 04/16/2014 "2, 12oz beers/day plus 2 shots liquor/day"  . Drug Use: Yes     Comment: "last drug use was in ~ 1995; marijuana"  . Sexual Activity: Yes   Other Topics Concern  . 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 Smoking status: Former Smoker -- 1.00 packs/day for 33 years    Types: Cigarettes    Start date: 10/02/1970    Quit date: 01/02/2003  . Smokeless tobacco: Former Systems developer    Quit date: 10/03/2003     Comment: quit all tobacco  11 years ago  . Alcohol Use: 16.8 oz/week    14 Cans of beer, 14 Shots of liquor per week     Comment: 04/16/2014 "2, 12oz beers/day plus 2 shots liquor/day"   Colonoscopy: PAP: Bone density: Lipid panel:  No Known Allergies  Current Outpatient Prescriptions  Medication Sig Dispense Refill  . amLODipine-olmesartan (AZOR) 10-40 MG per tablet Take 1 tablet by mouth daily.      . famciclovir (FAMVIR) 500 MG tablet Take 0.5 tablets (250 mg total) by mouth daily.  30 tablet  3  . fluticasone (FLONASE) 50 MCG/ACT nasal spray Place 2 sprays into both nostrils  daily as needed for allergies.      . folic acid (FOLVITE) 1 MG tablet Take 1 tablet (1 mg total) by mouth daily.  30 tablet  6  . Multiple Vitamin (MULTIVITAMIN WITH MINERALS) TABS tablet Take 1 tablet by mouth daily.      . pantoprazole (PROTONIX) 40 MG tablet Take 1 tablet (40 mg total) by mouth daily.  30 tablet  6  . potassium chloride (K-DUR,KLOR-CON) 10 MEQ tablet take 3 tablets by mouth once daily      . simvastatin (ZOCOR) 40 MG tablet Take 40 mg by mouth daily.      Marland Kitchen sulfamethoxazole-trimethoprim (BACTRIM DS) 800-160 MG per tablet Take 1 tablet by mouth 3 (three) times a week.  30 tablet  3   No current facility-administered medications for this visit.   Facility-Administered Medications Ordered in Other Visits  Medication Dose Route Frequency Provider Last Rate Last Dose  . romiPLOStim (NPLATE) injection 195 mcg  2 mcg/kg Subcutaneous Once Eliezer Bottom, NP       OBJECTIVE: Filed Vitals:   06/27/14 1210  BP: 134/77  Pulse: 76  Temp: 98 F (36.7 C)  Resp: 18   Body mass index is 32.7 kg/(m^2). ECOG FS:0 - Asymptomatic Ocular: Sclerae unicteric, pupils equal, round and reactive to light Ear-nose-throat: Oropharynx clear, dentition fair Lymphatic: No cervical or supraclavicular adenopathy Lungs no rales or rhonchi, good excursion bilaterally Heart regular rate and rhythm, no murmur appreciated Abd soft, nontender, positive bowel sounds MSK no focal spinal tenderness, no joint edema Neuro: non-focal, well-oriented, appropriate affect  LAB RESULTS: CMP     Component Value Date/Time   NA 138 06/27/2014 1119   NA 138 04/30/2014 0438   K 3.2* 06/27/2014 1119   K 4.1 04/30/2014 0438   CL 101 06/27/2014 1119   CL 104 04/30/2014 0438   CO2 27 06/27/2014 1119   CO2 26 04/30/2014 0438   GLUCOSE 130* 06/27/2014 1119   GLUCOSE 136* 04/30/2014 0438   BUN 10 06/27/2014 1119   BUN 23 04/30/2014 0438   CREATININE 1.0 06/27/2014 1119   CREATININE 1.09 04/30/2014 0438   CALCIUM 8.7 06/27/2014 1119    CALCIUM 8.1* 04/30/2014 0438   PROT 7.3 06/27/2014 1119   PROT 7.6 04/28/2014 0346   ALBUMIN 3.3* 04/28/2014 0346   AST 41* 06/27/2014 1119   AST 28 04/28/2014 0346   ALT 29 06/27/2014 1119   ALT 44 04/28/2014 0346   ALKPHOS 58 06/27/2014 1119   ALKPHOS 76 04/28/2014 0346   BILITOT 0.60 06/27/2014  1119   BILITOT 0.3 04/28/2014 0346   GFRNONAA 72* 04/30/2014 0438   GFRAA 83* 04/30/2014 0438   No results found for this basename: SPEP, UPEP,  kappa and lambda light chains   Lab Results  Component Value Date   WBC 5.0 06/27/2014   NEUTROABS 3.2 06/27/2014   HGB 10.9* 06/27/2014   HCT 32.9* 06/27/2014   MCV 86 06/27/2014   PLT <6* 06/27/2014   No results found for this basename: LABCA2   No components found with this basename: [id number]   No results found for this basename: INR,  in the last 168 hours  STUDIES: No results found.  ASSESSMENT/PLAN: Scott Waters is a 58 year old gentleman with immune thrombocytopenia. Despite IVIG his platelet count today is < 6,000 but he is asymptomatic at this time.  He will receive Nplate today. We will see how this works for him. He may need a bone marrow biopsy. He will continue getting weekly CBCs.  We will see him again in 3 weeks for labs and follow-up appointment.  All questions were answered and he is in agreement with the plan. He knows to call here with any new questions or concerns and to go to the ED in the event of an emergency. We can certainly see him sooner if need be.   Eliezer Bottom, NP 06/27/2014 12:50 PM

## 2014-06-27 NOTE — Patient Instructions (Signed)
Romiplostim injection What is this medicine? ROMIPLOSTIM (roe mi PLOE stim) helps your body make more platelets. This medicine is used to treat low platelets caused by chronic idiopathic thrombocytopenic purpura (ITP). This medicine may be used for other purposes; ask your health care provider or pharmacist if you have questions. COMMON BRAND NAME(S): Nplate What should I tell my health care provider before I take this medicine? They need to know if you have any of these conditions: -cancer or myelodysplastic syndrome -low blood counts, like low white cell, platelet, or red cell counts -take medicines that treat or prevent blood clots -an unusual or allergic reaction to romiplostim, mannitol, other medicines, foods, dyes, or preservatives -pregnant or trying to get pregnant -breast-feeding How should I use this medicine? This medicine is for injection under the skin. It is given by a health care professional in a hospital or clinic setting. A special MedGuide will be given to you before your injection. Read this information carefully each time. Talk to your pediatrician regarding the use of this medicine in children. Special care may be needed. Overdosage: If you think you have taken too much of this medicine contact a poison control center or emergency room at once. NOTE: This medicine is only for you. Do not share this medicine with others. What if I miss a dose? It is important not to miss your dose. Call your doctor or health care professional if you are unable to keep an appointment. What may interact with this medicine? Interactions are not expected. This list may not describe all possible interactions. Give your health care provider a list of all the medicines, herbs, non-prescription drugs, or dietary supplements you use. Also tell them if you smoke, drink alcohol, or use illegal drugs. Some items may interact with your medicine. What should I watch for while using this  medicine? Your condition will be monitored carefully while you are receiving this medicine. Visit your prescriber or health care professional for regular checks on your progress and for the needed blood tests. It is important to keep all appointments. What side effects may I notice from receiving this medicine? Side effects that you should report to your doctor or health care professional as soon as possible: -allergic reactions like skin rash, itching or hives, swelling of the face, lips, or tongue -shortness of breath, chest pain, swelling in a leg -unusual bleeding or bruising Side effects that usually do not require medical attention (report to your doctor or health care professional if they continue or are bothersome): -dizziness -headache -muscle aches -pain in arms and legs -stomach pain -trouble sleeping This list may not describe all possible side effects. Call your doctor for medical advice about side effects. You may report side effects to FDA at 1-800-FDA-1088. Where should I keep my medicine? This drug is given in a hospital or clinic and will not be stored at home. NOTE: This sheet is a summary. It may not cover all possible information. If you have questions about this medicine, talk to your doctor, pharmacist, or health care provider.  2015, Elsevier/Gold Standard. (2008-07-11 15:13:04)  

## 2014-07-04 ENCOUNTER — Other Ambulatory Visit (INDEPENDENT_AMBULATORY_CARE_PROVIDER_SITE_OTHER): Payer: BC Managed Care – PPO

## 2014-07-04 DIAGNOSIS — E876 Hypokalemia: Secondary | ICD-10-CM

## 2014-07-04 LAB — POTASSIUM: Potassium: 4.3 mEq/L (ref 3.5–5.1)

## 2014-07-08 ENCOUNTER — Encounter: Payer: Self-pay | Admitting: Family Medicine

## 2014-07-11 ENCOUNTER — Encounter: Payer: Self-pay | Admitting: Hematology & Oncology

## 2014-07-11 ENCOUNTER — Ambulatory Visit (HOSPITAL_BASED_OUTPATIENT_CLINIC_OR_DEPARTMENT_OTHER): Payer: BC Managed Care – PPO | Admitting: Hematology & Oncology

## 2014-07-11 ENCOUNTER — Ambulatory Visit (HOSPITAL_BASED_OUTPATIENT_CLINIC_OR_DEPARTMENT_OTHER): Payer: BC Managed Care – PPO | Admitting: Lab

## 2014-07-11 ENCOUNTER — Ambulatory Visit (HOSPITAL_BASED_OUTPATIENT_CLINIC_OR_DEPARTMENT_OTHER): Payer: BC Managed Care – PPO

## 2014-07-11 VITALS — BP 143/77 | HR 86 | Temp 98.4°F | Resp 20 | Ht 68.0 in | Wt 210.0 lb

## 2014-07-11 DIAGNOSIS — D693 Immune thrombocytopenic purpura: Secondary | ICD-10-CM

## 2014-07-11 LAB — CBC WITH DIFFERENTIAL (CANCER CENTER ONLY)
BASO#: 0 10*3/uL (ref 0.0–0.2)
BASO%: 0.4 % (ref 0.0–2.0)
EOS%: 3.2 % (ref 0.0–7.0)
Eosinophils Absolute: 0.2 10*3/uL (ref 0.0–0.5)
HCT: 29.6 % — ABNORMAL LOW (ref 38.7–49.9)
HGB: 9.9 g/dL — ABNORMAL LOW (ref 13.0–17.1)
LYMPH#: 1 10*3/uL (ref 0.9–3.3)
LYMPH%: 18.4 % (ref 14.0–48.0)
MCH: 29.6 pg (ref 28.0–33.4)
MCHC: 33.4 g/dL (ref 32.0–35.9)
MCV: 88 fL (ref 82–98)
MONO#: 0.5 10*3/uL (ref 0.1–0.9)
MONO%: 8.4 % (ref 0.0–13.0)
NEUT%: 69.6 % (ref 40.0–80.0)
NEUTROS ABS: 3.9 10*3/uL (ref 1.5–6.5)
RBC: 3.35 10*6/uL — ABNORMAL LOW (ref 4.20–5.70)
RDW: 17.3 % — ABNORMAL HIGH (ref 11.1–15.7)
WBC: 5.6 10*3/uL (ref 4.0–10.0)

## 2014-07-11 MED ORDER — ROMIPLOSTIM 250 MCG ~~LOC~~ SOLR
3.0000 ug/kg | Freq: Once | SUBCUTANEOUS | Status: AC
  Administered 2014-07-11: 285 ug via SUBCUTANEOUS
  Filled 2014-07-11: qty 0.57

## 2014-07-11 NOTE — Patient Instructions (Signed)
Romiplostim injection What is this medicine? ROMIPLOSTIM (roe mi PLOE stim) helps your body make more platelets. This medicine is used to treat low platelets caused by chronic idiopathic thrombocytopenic purpura (ITP). This medicine may be used for other purposes; ask your health care provider or pharmacist if you have questions. COMMON BRAND NAME(S): Nplate What should I tell my health care provider before I take this medicine? They need to know if you have any of these conditions: -cancer or myelodysplastic syndrome -low blood counts, like low white cell, platelet, or red cell counts -take medicines that treat or prevent blood clots -an unusual or allergic reaction to romiplostim, mannitol, other medicines, foods, dyes, or preservatives -pregnant or trying to get pregnant -breast-feeding How should I use this medicine? This medicine is for injection under the skin. It is given by a health care professional in a hospital or clinic setting. A special MedGuide will be given to you before your injection. Read this information carefully each time. Talk to your pediatrician regarding the use of this medicine in children. Special care may be needed. Overdosage: If you think you have taken too much of this medicine contact a poison control center or emergency room at once. NOTE: This medicine is only for you. Do not share this medicine with others. What if I miss a dose? It is important not to miss your dose. Call your doctor or health care professional if you are unable to keep an appointment. What may interact with this medicine? Interactions are not expected. This list may not describe all possible interactions. Give your health care provider a list of all the medicines, herbs, non-prescription drugs, or dietary supplements you use. Also tell them if you smoke, drink alcohol, or use illegal drugs. Some items may interact with your medicine. What should I watch for while using this  medicine? Your condition will be monitored carefully while you are receiving this medicine. Visit your prescriber or health care professional for regular checks on your progress and for the needed blood tests. It is important to keep all appointments. What side effects may I notice from receiving this medicine? Side effects that you should report to your doctor or health care professional as soon as possible: -allergic reactions like skin rash, itching or hives, swelling of the face, lips, or tongue -shortness of breath, chest pain, swelling in a leg -unusual bleeding or bruising Side effects that usually do not require medical attention (report to your doctor or health care professional if they continue or are bothersome): -dizziness -headache -muscle aches -pain in arms and legs -stomach pain -trouble sleeping This list may not describe all possible side effects. Call your doctor for medical advice about side effects. You may report side effects to FDA at 1-800-FDA-1088. Where should I keep my medicine? This drug is given in a hospital or clinic and will not be stored at home. NOTE: This sheet is a summary. It may not cover all possible information. If you have questions about this medicine, talk to your doctor, pharmacist, or health care provider.  2015, Elsevier/Gold Standard. (2008-07-11 15:13:04)  

## 2014-07-11 NOTE — Progress Notes (Signed)
Hematology and Oncology Follow Up Visit  Scott Waters [id number] [birth year] 58 y.o. 07/11/2014   Principle Diagnosis:  Refractory chronic immune thrombocytopenia Current Therapy:    Nplate-dose adjusted  Patient to start Rituxan next week     Interim History:  Mr.  Waters is back for another visit. Unfortunately, his platelet count is down less than 6000. He began to have some nosebleeds and some Abreva was done over the weekend.  He got Nplate 2 weeks ago. He got a dose of 2 mcg per kilogram. I that point time, his platelet count was less than 6K. we have given him 2 doses of IVIG. We have given him pulse Decadron.  He is still working. He has had no fever. He's had no change in medications. He's had no nausea or vomiting. He he has had no change in bowel or bladder habits. He has had no headache.  Medications: Current outpatient prescriptions:amLODipine-olmesartan (AZOR) 10-40 MG per tablet, Take 1 tablet by mouth daily., Disp: , Rfl: ;  famciclovir (FAMVIR) 500 MG tablet, Take 0.5 tablets (250 mg total) by mouth daily., Disp: 30 tablet, Rfl: 3;  fluticasone (FLONASE) 50 MCG/ACT nasal spray, Place 2 sprays into both nostrils daily as needed for allergies., Disp: , Rfl:  folic acid (FOLVITE) 1 MG tablet, Take 1 tablet (1 mg total) by mouth daily., Disp: 30 tablet, Rfl: 6;  Multiple Vitamin (MULTIVITAMIN WITH MINERALS) TABS tablet, Take 1 tablet by mouth daily., Disp: , Rfl: ;  pantoprazole (PROTONIX) 40 MG tablet, Take 1 tablet (40 mg total) by mouth daily., Disp: 30 tablet, Rfl: 6;  potassium chloride (K-DUR,KLOR-CON) 10 MEQ tablet, take 3 tablets by mouth once daily, Disp: , Rfl:  simvastatin (ZOCOR) 40 MG tablet, Take 40 mg by mouth daily., Disp: , Rfl: ;  sulfamethoxazole-trimethoprim (BACTRIM DS) 800-160 MG per tablet, Take 1 tablet by mouth 3 (three) times a week., Disp: 30 tablet, Rfl: 3;  VITAMIN K PO, Take by mouth every morning. OTC med, Disp: , Rfl:  Current facility-administered  medications:romiPLOStim (NPLATE) injection 285 mcg, 3 mcg/kg (Order-Specific), Subcutaneous, Once, [name], MD  Allergies: No Known Allergies  Past Medical History, Surgical history, Social history, and Family History were reviewed and updated.  Review of Systems: As above  Physical Exam:  height is 5\' 8"  (1.727 m) and weight is 210 lb (95.255 kg). His oral temperature is 98.4 F (36.9 C). His blood pressure is 143/77 and his pulse is 86. His respiration is 20.   He does have some petechia on his lower legs. There is no lymphadenopathy. There is no palpable liver or spleen. His lungs are clear. Cardiac exam regular rate and rhythm. Neurological exam is nonfocal. Head and neck exam shows no adenopathy. There is no petechia within the oral cavity. He has good strength in his extremities. She has no joint swelling.  Lab Results  Component Value Date   WBC 5.6 07/11/2014   HGB 9.9* 07/11/2014   HCT 29.6* 07/11/2014   MCV 88 07/11/2014   PLT <6* 07/11/2014     Chemistry      Component Value Date/Time   NA 138 06/27/2014 1119   NA 138 04/30/2014 0438   K 4.3 07/04/2014 1041   K 3.2* 06/27/2014 1119   CL 101 06/27/2014 1119   CL 104 04/30/2014 0438   CO2 27 06/27/2014 1119   CO2 26 04/30/2014 0438   BUN 10 06/27/2014 1119   BUN 23 04/30/2014 0438   CREATININE 1.0 06/27/2014  1119   CREATININE 1.09 04/30/2014 0438      Component Value Date/Time   CALCIUM 8.7 06/27/2014 1119   CALCIUM 8.1* 04/30/2014 0438   ALKPHOS 58 06/27/2014 1119   ALKPHOS 76 04/28/2014 0346   AST 41* 06/27/2014 1119   AST 28 04/28/2014 0346   ALT 29 06/27/2014 1119   ALT 44 04/28/2014 0346   BILITOT 0.60 06/27/2014 1119   BILITOT 0.3 04/28/2014 0346         Impression and Plan: Scott Waters is 58 year old gentleman with refractory thrombocytopenia. We have tried if there is interventions but yet nothing has worked. He has had a transient response to IVIG. He really did not have a great response to steroids.  We will continue him on  Nplate. I will give him a 3 mcg per kilogram dose today.  I talked her about Rituxan. I think this would be the next option for [place]. I explained to him how Rituxan works. I explained to him how Rituxan tries to eliminate the lymphocytes that are producing the antibody that is attacking his platelets. I told him that the spleen is where the platelets are being destroyed.  He agrees to the Rituxan. I told him that the first time he gets Rituxan, that is when he can have side effects. I told him that the side effects might be chills, wheezing, flushing, myalgias, some shortness of breath. I told him that if this does happen, that we will give medication to help with it.  We will continue the Nplate weekly for now.  This is becoming a lot more complicated than I would have ever thought.  Again, we will start on August 26.   [name], MD 8/17/20152:41 PM

## 2014-07-20 ENCOUNTER — Other Ambulatory Visit (HOSPITAL_BASED_OUTPATIENT_CLINIC_OR_DEPARTMENT_OTHER): Payer: BC Managed Care – PPO | Admitting: Lab

## 2014-07-20 ENCOUNTER — Ambulatory Visit (HOSPITAL_BASED_OUTPATIENT_CLINIC_OR_DEPARTMENT_OTHER): Payer: BC Managed Care – PPO

## 2014-07-20 ENCOUNTER — Ambulatory Visit: Payer: BC Managed Care – PPO | Admitting: Hematology & Oncology

## 2014-07-20 ENCOUNTER — Other Ambulatory Visit: Payer: BC Managed Care – PPO | Admitting: Lab

## 2014-07-20 ENCOUNTER — Encounter: Payer: Self-pay | Admitting: Hematology & Oncology

## 2014-07-20 VITALS — BP 123/70 | HR 79 | Temp 98.8°F | Resp 18

## 2014-07-20 DIAGNOSIS — D693 Immune thrombocytopenic purpura: Secondary | ICD-10-CM

## 2014-07-20 DIAGNOSIS — D473 Essential (hemorrhagic) thrombocythemia: Secondary | ICD-10-CM

## 2014-07-20 DIAGNOSIS — Z5112 Encounter for antineoplastic immunotherapy: Secondary | ICD-10-CM

## 2014-07-20 LAB — CBC WITH DIFFERENTIAL (CANCER CENTER ONLY)
BASO#: 0 10*3/uL (ref 0.0–0.2)
BASO%: 0.4 % (ref 0.0–2.0)
EOS%: 10.9 % — ABNORMAL HIGH (ref 0.0–7.0)
Eosinophils Absolute: 0.5 10*3/uL (ref 0.0–0.5)
HEMATOCRIT: 31.6 % — AB (ref 38.7–49.9)
HGB: 10.5 g/dL — ABNORMAL LOW (ref 13.0–17.1)
LYMPH#: 1 10*3/uL (ref 0.9–3.3)
LYMPH%: 19.8 % (ref 14.0–48.0)
MCH: 29.5 pg (ref 28.0–33.4)
MCHC: 33.2 g/dL (ref 32.0–35.9)
MCV: 89 fL (ref 82–98)
MONO#: 0.5 10*3/uL (ref 0.1–0.9)
MONO%: 10.5 % (ref 0.0–13.0)
NEUT#: 2.8 10*3/uL (ref 1.5–6.5)
NEUT%: 58.4 % (ref 40.0–80.0)
Platelets: <6 10*3/uL — CL (ref 145–400)
RBC: 3.56 10*6/uL — ABNORMAL LOW (ref 4.20–5.70)
RDW: 16.8 % — AB (ref 11.1–15.7)
WBC: 4.9 10*3/uL (ref 4.0–10.0)

## 2014-07-20 LAB — CMP (CANCER CENTER ONLY)
ALK PHOS: 75 U/L (ref 26–84)
ALT(SGPT): 36 U/L (ref 10–47)
AST: 51 U/L — ABNORMAL HIGH (ref 11–38)
Albumin: 3.4 g/dL (ref 3.3–5.5)
BUN, Bld: 11 mg/dL (ref 7–22)
CHLORIDE: 103 meq/L (ref 98–108)
CO2: 27 meq/L (ref 18–33)
Calcium: 8.6 mg/dL (ref 8.0–10.3)
Creat: 0.9 mg/dl (ref 0.6–1.2)
Glucose, Bld: 133 mg/dL — ABNORMAL HIGH (ref 73–118)
Potassium: 3.3 mEq/L (ref 3.3–4.7)
SODIUM: 139 meq/L (ref 128–145)
TOTAL PROTEIN: 6.9 g/dL (ref 6.4–8.1)
Total Bilirubin: 0.8 mg/dl (ref 0.20–1.60)

## 2014-07-20 MED ORDER — SODIUM CHLORIDE 0.9 % IV SOLN
Freq: Once | INTRAVENOUS | Status: AC
  Administered 2014-07-20: 10:00:00 via INTRAVENOUS

## 2014-07-20 MED ORDER — SODIUM CHLORIDE 0.9 % IV SOLN
375.0000 mg/m2 | Freq: Once | INTRAVENOUS | Status: AC
  Administered 2014-07-20: 800 mg via INTRAVENOUS
  Filled 2014-07-20: qty 80

## 2014-07-20 MED ORDER — ACETAMINOPHEN 325 MG PO TABS
ORAL_TABLET | ORAL | Status: AC
  Filled 2014-07-20: qty 2

## 2014-07-20 MED ORDER — DIPHENHYDRAMINE HCL 25 MG PO CAPS
ORAL_CAPSULE | ORAL | Status: AC
  Filled 2014-07-20: qty 2

## 2014-07-20 MED ORDER — ACETAMINOPHEN 325 MG PO TABS
650.0000 mg | ORAL_TABLET | Freq: Once | ORAL | Status: AC
  Administered 2014-07-20: 650 mg via ORAL

## 2014-07-20 MED ORDER — DIPHENHYDRAMINE HCL 25 MG PO CAPS
50.0000 mg | ORAL_CAPSULE | Freq: Once | ORAL | Status: AC
  Administered 2014-07-20: 50 mg via ORAL

## 2014-07-20 MED ORDER — ROMIPLOSTIM INJECTION 500 MCG
5.0000 ug/kg | Freq: Once | SUBCUTANEOUS | Status: AC
  Administered 2014-07-20: 475 ug via SUBCUTANEOUS
  Filled 2014-07-20: qty 0.95

## 2014-07-20 NOTE — Patient Instructions (Signed)

## 2014-07-27 ENCOUNTER — Other Ambulatory Visit (HOSPITAL_BASED_OUTPATIENT_CLINIC_OR_DEPARTMENT_OTHER): Payer: BC Managed Care – PPO | Admitting: Lab

## 2014-07-27 ENCOUNTER — Encounter: Payer: Self-pay | Admitting: Family

## 2014-07-27 ENCOUNTER — Ambulatory Visit (HOSPITAL_BASED_OUTPATIENT_CLINIC_OR_DEPARTMENT_OTHER): Payer: BC Managed Care – PPO

## 2014-07-27 ENCOUNTER — Ambulatory Visit (HOSPITAL_BASED_OUTPATIENT_CLINIC_OR_DEPARTMENT_OTHER): Payer: BC Managed Care – PPO | Admitting: Family

## 2014-07-27 VITALS — BP 117/69 | HR 70 | Temp 98.0°F | Resp 18

## 2014-07-27 VITALS — BP 137/75 | HR 75 | Temp 98.0°F | Resp 18 | Ht 68.0 in | Wt 212.0 lb

## 2014-07-27 DIAGNOSIS — D473 Essential (hemorrhagic) thrombocythemia: Secondary | ICD-10-CM

## 2014-07-27 DIAGNOSIS — D696 Thrombocytopenia, unspecified: Secondary | ICD-10-CM

## 2014-07-27 DIAGNOSIS — D693 Immune thrombocytopenic purpura: Secondary | ICD-10-CM

## 2014-07-27 DIAGNOSIS — K055 Other periodontal diseases: Secondary | ICD-10-CM

## 2014-07-27 DIAGNOSIS — Z5112 Encounter for antineoplastic immunotherapy: Secondary | ICD-10-CM

## 2014-07-27 DIAGNOSIS — R040 Epistaxis: Secondary | ICD-10-CM

## 2014-07-27 LAB — CBC WITH DIFFERENTIAL (CANCER CENTER ONLY)
BASO#: 0 10*3/uL (ref 0.0–0.2)
BASO%: 0.4 % (ref 0.0–2.0)
EOS%: 5.4 % (ref 0.0–7.0)
Eosinophils Absolute: 0.3 10*3/uL (ref 0.0–0.5)
HCT: 30.2 % — ABNORMAL LOW (ref 38.7–49.9)
HEMOGLOBIN: 10 g/dL — AB (ref 13.0–17.1)
LYMPH#: 0.8 10*3/uL — ABNORMAL LOW (ref 0.9–3.3)
LYMPH%: 15.1 % (ref 14.0–48.0)
MCH: 29.4 pg (ref 28.0–33.4)
MCHC: 33.1 g/dL (ref 32.0–35.9)
MCV: 89 fL (ref 82–98)
MONO#: 0.6 10*3/uL (ref 0.1–0.9)
MONO%: 11.3 % (ref 0.0–13.0)
NEUT#: 3.4 10*3/uL (ref 1.5–6.5)
NEUT%: 67.8 % (ref 40.0–80.0)
Platelets: <6 10*3/uL — CL (ref 145–400)
RBC: 3.4 10*6/uL — ABNORMAL LOW (ref 4.20–5.70)
RDW: 16.3 % — ABNORMAL HIGH (ref 11.1–15.7)
WBC: 5 10*3/uL (ref 4.0–10.0)

## 2014-07-27 LAB — TECHNOLOGIST REVIEW CHCC SATELLITE

## 2014-07-27 LAB — CHCC SATELLITE - SMEAR

## 2014-07-27 MED ORDER — ROMIPLOSTIM 250 MCG ~~LOC~~ SOLR: 2.0000 ug/kg | Freq: Once | SUBCUTANEOUS | Status: DC

## 2014-07-27 MED ORDER — DIPHENHYDRAMINE HCL 25 MG PO CAPS
50.0000 mg | ORAL_CAPSULE | Freq: Once | ORAL | Status: AC
  Administered 2014-07-27: 50 mg via ORAL

## 2014-07-27 MED ORDER — DIPHENHYDRAMINE HCL 25 MG PO CAPS
ORAL_CAPSULE | ORAL | Status: AC
  Filled 2014-07-27: qty 2

## 2014-07-27 MED ORDER — ACETAMINOPHEN 325 MG PO TABS
650.0000 mg | ORAL_TABLET | Freq: Once | ORAL | Status: AC
  Administered 2014-07-27: 650 mg via ORAL

## 2014-07-27 MED ORDER — SODIUM CHLORIDE 0.9 % IV SOLN
375.0000 mg/m2 | Freq: Once | INTRAVENOUS | Status: AC
  Administered 2014-07-27: 800 mg via INTRAVENOUS
  Filled 2014-07-27: qty 80

## 2014-07-27 MED ORDER — ACETAMINOPHEN 325 MG PO TABS
ORAL_TABLET | ORAL | Status: AC
  Filled 2014-07-27: qty 2

## 2014-07-27 MED ORDER — SODIUM CHLORIDE 0.9 % IV SOLN
Freq: Once | INTRAVENOUS | Status: AC
  Administered 2014-07-27: 10:00:00 via INTRAVENOUS

## 2014-07-27 MED ORDER — ROMIPLOSTIM INJECTION 500 MCG
500.0000 ug | Freq: Once | SUBCUTANEOUS | Status: AC
  Administered 2014-07-27: 500 ug via SUBCUTANEOUS
  Filled 2014-07-27: qty 1

## 2014-07-27 MED ORDER — ROMIPLOSTIM INJECTION 500 MCG
500.0000 ug | Freq: Once | SUBCUTANEOUS | Status: DC
  Filled 2014-07-27: qty 1

## 2014-07-27 NOTE — Patient Instructions (Signed)
Rituximab injection What is this medicine? RITUXIMAB (ri TUX i mab) is a monoclonal antibody. This medicine changes the way the body's immune system works. It is used commonly to treat non-Hodgkin's lymphoma and other conditions. In cancer cells, this drug targets a specific protein within cancer cells and stops the cancer cells from growing. It is also used to treat rhuematoid arthritis (RA). In RA, this medicine slow the inflammatory process and help reduce joint pain and swelling. This medicine is often used with other cancer or arthritis medications. This medicine may be used for other purposes; ask your health care provider or pharmacist if you have questions. COMMON BRAND NAME(S): Rituxan What should I tell my health care provider before I take this medicine? They need to know if you have any of these conditions: -blood disorders -heart disease -history of hepatitis B -infection (especially a virus infection such as chickenpox, cold sores, or herpes) -irregular heartbeat -kidney disease -lung or breathing disease, like asthma -lupus -an unusual or allergic reaction to rituximab, mouse proteins, other medicines, foods, dyes, or preservatives -pregnant or trying to get pregnant -breast-feeding How should I use this medicine? This medicine is for infusion into a vein. It is administered in a hospital or clinic by a specially trained health care professional. A special MedGuide will be given to you by the pharmacist with each prescription and refill. Be sure to read this information carefully each time. Talk to your pediatrician regarding the use of this medicine in children. This medicine is not approved for use in children. Overdosage: If you think you have taken too much of this medicine contact a poison control center or emergency room at once. NOTE: This medicine is only for you. Do not share this medicine with others. What if I miss a dose? It is important not to miss a dose. Call  your doctor or health care professional if you are unable to keep an appointment. What may interact with this medicine? -cisplatin -medicines for blood pressure -some other medicines for arthritis -vaccines This list may not describe all possible interactions. Give your health care provider a list of all the medicines, herbs, non-prescription drugs, or dietary supplements you use. Also tell them if you smoke, drink alcohol, or use illegal drugs. Some items may interact with your medicine. What should I watch for while using this medicine? Report any side effects that you notice during your treatment right away, such as changes in your breathing, fever, chills, dizziness or lightheadedness. These effects are more common with the first dose. Visit your prescriber or health care professional for checks on your progress. You will need to have regular blood work. Report any other side effects. The side effects of this medicine can continue after you finish your treatment. Continue your course of treatment even though you feel ill unless your doctor tells you to stop. Call your doctor or health care professional for advice if you get a fever, chills or sore throat, or other symptoms of a cold or flu. Do not treat yourself. This drug decreases your body's ability to fight infections. Try to avoid being around people who are sick. This medicine may increase your risk to bruise or bleed. Call your doctor or health care professional if you notice any unusual bleeding. Be careful brushing and flossing your teeth or using a toothpick because you may get an infection or bleed more easily. If you have any dental work done, tell your dentist you are receiving this medicine. Avoid taking products   that contain aspirin, acetaminophen, ibuprofen, naproxen, or ketoprofen unless instructed by your doctor. These medicines may hide a fever. Do not become pregnant while taking this medicine. Women should inform their doctor  if they wish to become pregnant or think they might be pregnant. There is a potential for serious side effects to an unborn child. Talk to your health care professional or pharmacist for more information. Do not breast-feed an infant while taking this medicine. What side effects may I notice from receiving this medicine? Side effects that you should report to your doctor or health care professional as soon as possible: -allergic reactions like skin rash, itching or hives, swelling of the face, lips, or tongue -low blood counts - this medicine may decrease the number of white blood cells, red blood cells and platelets. You may be at increased risk for infections and bleeding. -signs of infection - fever or chills, cough, sore throat, pain or difficulty passing urine -signs of decreased platelets or bleeding - bruising, pinpoint red spots on the skin, black, tarry stools, blood in the urine -signs of decreased red blood cells - unusually weak or tired, fainting spells, lightheadedness -breathing problems -confused, not responsive -chest pain -fast, irregular heartbeat -feeling faint or lightheaded, falls -mouth sores -redness, blistering, peeling or loosening of the skin, including inside the mouth -stomach pain -swelling of the ankles, feet, or hands -trouble passing urine or change in the amount of urine Side effects that usually do not require medical attention (report to your doctor or other health care professional if they continue or are bothersome): -anxiety -headache -loss of appetite -muscle aches -nausea -night sweats This list may not describe all possible side effects. Call your doctor for medical advice about side effects. You may report side effects to FDA at 1-800-FDA-1088. Where should I keep my medicine? This drug is given in a hospital or clinic and will not be stored at home. NOTE: This sheet is a summary. It may not cover all possible information. If you have questions  about this medicine, talk to your doctor, pharmacist, or health care provider.  2015, [employer]vier/Gold Standard. (2008-07-11 14:04:59)  Romiplostim injection What is this medicine? ROMIPLOSTIM (roe mi PLOE stim) helps your body make more platelets. This medicine is used to treat low platelets caused by chronic idiopathic thrombocytopenic purpura (ITP). This medicine may be used for other purposes; ask your health care provider or pharmacist if you have questions. COMMON BRAND NAME(S): Nplate What should I tell my health care provider before I take this medicine? They need to know if you have any of these conditions: -cancer or myelodysplastic syndrome -low blood counts, like low white cell, platelet, or red cell counts -take medicines that treat or prevent blood clots -an unusual or allergic reaction to romiplostim, mannitol, other medicines, foods, dyes, or preservatives -pregnant or trying to get pregnant -breast-feeding How should I use this medicine? This medicine is for injection under the skin. It is given by a health care professional in a hospital or clinic setting. A special MedGuide will be given to you before your injection. Read this information carefully each time. Talk to your pediatrician regarding the use of this medicine in children. Special care may be needed. Overdosage: If you think you have taken too much of this medicine contact a poison control center or emergency room at once. NOTE: This medicine is only for you. Do not share this medicine with others. What if I miss a dose? It is important not to miss  your dose. Call your doctor or health care professional if you are unable to keep an appointment. What may interact with this medicine? Interactions are not expected. This list may not describe all possible interactions. Give your health care provider a list of all the medicines, herbs, non-prescription drugs, or dietary supplements you use. Also tell them if you smoke,  drink alcohol, or use illegal drugs. Some items may interact with your medicine. What should I watch for while using this medicine? Your condition will be monitored carefully while you are receiving this medicine. Visit your prescriber or health care professional for regular checks on your progress and for the needed blood tests. It is important to keep all appointments. What side effects may I notice from receiving this medicine? Side effects that you should report to your doctor or health care professional as soon as possible: -allergic reactions like skin rash, itching or hives, swelling of the face, lips, or tongue -shortness of breath, chest pain, swelling in a leg -unusual bleeding or bruising Side effects that usually do not require medical attention (report to your doctor or health care professional if they continue or are bothersome): -dizziness -headache -muscle aches -pain in arms and legs -stomach pain -trouble sleeping This list may not describe all possible side effects. Call your doctor for medical advice about side effects. You may report side effects to FDA at 1-800-FDA-1088. Where should I keep my medicine? This drug is given in a hospital or clinic and will not be stored at home. NOTE: This sheet is a summary. It may not cover all possible information. If you have questions about this medicine, talk to your doctor, pharmacist, or health care provider.  2015, [employer]vier/Gold Standard. (2008-07-11 15:13:04)

## 2014-07-27 NOTE — Progress Notes (Signed)
[place]  Telephone:([phone]) [phone] Fax:(336) [phone]  ID: Scott Waters OB: 04/05/1956 MR#: [id number]#:[id number] Patient Care Team: Burnis Medin, MD as PCP - General Irene Shipper, MD as Attending Physician (Gastroenterology)  DIAGNOSIS: Refractory chronic immune thrombocytopenia  INTERVAL HISTORY: Scott Waters is back today for a follow-up. Her received Rituxan and Nplate last week. His platelet count is still down less than 6000. He is still have nose bleeds and bleeding around his gums. He is still working. He has had no pain. He denies fever, chills, n/v, cough, rash, headache, dizziness, SOB, chest pain, palpitations, abdominal pain, diarrhea, constipation, blood in urine or stool. He states that his urine has been dark twice but her did not see any blood in it. He has some chronic swelling in his legs, more in the right. He wears compression stockings and it is improved. He has had no tenderness, numbness or tingling in his extremities. His appetite is good and he is trying to drink plenty of fluids.   CURRENT TREATMENT: Rituxan Nplate  REVIEW OF SYSTEMS: All other 10 point review of systems is negative except for those issues mentioned above.   PAST MEDICAL HISTORY: Past Medical History  Diagnosis Date  . Seasonal allergies   . Hypertension   . Hyperlipidemia   . Hx of colonic polyps 2007  . Gout 04/27/2011    One episode of joint pain at [facility]   Presumed    . Abnormal LFTs 04/27/2011  . Thrombocytopenia 04/15/2014    admitted for IVIG  . GERD (gastroesophageal reflux disease)     "mild"  . Osteoarthritis   . Arthritis     "knees" (04/16/2014)  . ITP (idiopathic thrombocytopenic purpura) 04/30/2014   PAST SURGICAL HISTORY: Past Surgical History  Procedure Laterality Date  . Polypectomy    . Multiple tooth extractions  2014    "took out all the top teeth"  . Tonsillectomy and adenoidectomy  1973  . Laceration repair Right ~ 1983    "inner knee; steel  tubing fell on me"  . Shoulder arthroscopy Left 08/2001    "removed bone spur"   FAMILY HISTORY Family History  Problem Relation Age of Onset  . Colon cancer Father 66    Died at 18  . Hypertension Mother   . Diabetes Mother   . Kidney disease Mother     diailysis from dm   . Stroke     SOCIAL HISTORY:  History   Social History  . Marital Status: Married    Spouse Name: N/A    Number of Children: N/A  . Years of Education: N/A   Occupational History  . Not on file.   Social History Main Topics  . Smoking status: Former Smoker -- 1.00 packs/day for 33 years    Types: Cigarettes    Start date: 10/02/1970    Quit date: 01/02/2003  . Smokeless tobacco: Former Systems developer    Quit date: 10/03/2003     Comment: quit all tobacco  11 years ago  . Alcohol Use: 16.8 oz/week    14 Cans of beer, 14 Shots of liquor per week     Comment: 04/16/2014 "2, 12oz beers/day plus 2 shots liquor/day"  . Drug Use: Yes     Comment: "last drug use was in ~ 1995; marijuana"  . Sexual Activity: Yes   Other Topics Concern  . 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 Smoking status: Former Smoker -- 1.00 packs/day for 33 years    Types: Cigarettes    Start date: 10/02/1970    Quit date: 01/02/2003  . Smokeless tobacco: Former Systems developer    Quit date: 10/03/2003     Comment: quit all tobacco  11 years ago  . Alcohol Use: 16.8 oz/week    14 Cans of beer, 14 Shots of liquor per week     Comment: 04/16/2014 "2, 12oz beers/day plus 2 shots liquor/day"   Colonoscopy: PAP: Bone density: Lipid panel:  No Known Allergies  Current Outpatient Prescriptions  Medication Sig Dispense Refill  . amLODipine-olmesartan (AZOR) 10-40 MG per tablet Take 1 tablet by mouth  daily.      . fluticasone (FLONASE) 50 MCG/ACT nasal spray Place 2 sprays into both nostrils daily as needed for allergies.      . folic acid (FOLVITE) 1 MG tablet Take 1 tablet (1 mg total) by mouth daily.  30 tablet  6  . Multiple Vitamin (MULTIVITAMIN WITH MINERALS) TABS tablet Take 1 tablet by mouth daily.      . pantoprazole (PROTONIX) 40 MG tablet Take 1 tablet (40 mg total) by mouth daily.  30 tablet  6  . potassium chloride (K-DUR,KLOR-CON) 10 MEQ tablet take 3 tablets by mouth once daily      . simvastatin (ZOCOR) 40 MG tablet Take 40 mg by mouth daily.      Marland Kitchen VITAMIN K PO Take 100 mcg by mouth every morning. OTC med       Current Facility-Administered Medications  Medication Dose Route Frequency Provider Last Rate Last Dose  . romiPLOStim (NPLATE) injection 500 mcg  500 mcg Subcutaneous Once Eliezer Bottom, NP       OBJECTIVE: Filed Vitals:   07/27/14 0933  BP: 137/75  Pulse: 75  Temp: 98 F (36.7 C)  Resp: 18   Body mass index is 32.24 kg/(m^2). ECOG FS:1 - Symptomatic but completely ambulatory Ocular: Sclerae unicteric, pupils equal, round and reactive to light Ear-nose-throat: Oropharynx clear, dentition fair Lymphatic: No cervical or supraclavicular adenopathy Lungs no rales or rhonchi, good excursion bilaterally Heart regular rate and rhythm, no murmur appreciated Abd soft, nontender, positive bowel sounds MSK no focal spinal tenderness, no joint edema Neuro: non-focal, well-oriented, appropriate affect  LAB RESULTS: CMP     Component Value Date/Time   NA 139 07/20/2014 0902   NA 138 04/30/2014 0438   K 3.3 07/20/2014 0902   K 4.3 07/04/2014 1041   CL 103 07/20/2014 0902   CL 104 04/30/2014 0438   CO2 27 07/20/2014 0902   CO2 26 04/30/2014 0438   GLUCOSE 133* 07/20/2014 0902   GLUCOSE 136* 04/30/2014 0438   BUN 11 07/20/2014 0902   BUN 23 04/30/2014 0438   CREATININE 0.9 07/20/2014 0902   CREATININE 1.09 04/30/2014 0438   CALCIUM 8.6 07/20/2014 0902   CALCIUM 8.1*  04/30/2014 0438   PROT 6.9 07/20/2014 0902   PROT 7.6 04/28/2014 0346   ALBUMIN 3.3* 04/28/2014 0346   AST 51* 07/20/2014 0902   AST 28 04/28/2014 0346   ALT 36 07/20/2014 0902   ALT 44 04/28/2014 0346   ALKPHOS 75 07/20/2014 0902   ALKPHOS 76 04/28/2014 0346  BILITOT 0.80 07/20/2014 0902   BILITOT 0.3 04/28/2014 0346   GFRNONAA 72* 04/30/2014 0438   GFRAA 83* 04/30/2014 0438   No results found for this basename: SPEP, UPEP,  kappa and lambda light chains   Lab Results  Component Value Date   WBC 5.0 07/27/2014   NEUTROABS 3.4 07/27/2014   HGB 10.0* 07/27/2014   HCT 30.2* 07/27/2014   MCV 89 07/27/2014   PLT <6* 07/27/2014   No results found for this basename: LABCA2   No components found with this basename: [id number]   No results found for this basename: INR,  in the last 168 hours  STUDIES: No results found.  ASSESSMENT/PLAN: Scott Waters is 58 year old gentleman with refractory thrombocytopenia. We have tried several courses of treatment for him and unfortunately nothing seems to be working. He has had a transient response to IVIG but did not have a great response to steroids.  His platelets today are less than 6 and her is symptomatic with nose and gum bleeding.  We will give him Nplate and Rituxan today. Hopefully this will begin to help.  We will see him back next week for labs and follow-up.  He has his appointment schedule.  He knows to call with any questions or concerns. We can certainly see him sooner if need be.   Eliezer Bottom, NP 07/27/2014 10:00 AM

## 2014-07-28 ENCOUNTER — Encounter: Payer: Self-pay | Admitting: *Deleted

## 2014-08-02 ENCOUNTER — Other Ambulatory Visit (HOSPITAL_BASED_OUTPATIENT_CLINIC_OR_DEPARTMENT_OTHER): Payer: BC Managed Care – PPO | Admitting: Lab

## 2014-08-02 ENCOUNTER — Telehealth: Payer: Self-pay | Admitting: Hematology & Oncology

## 2014-08-02 ENCOUNTER — Ambulatory Visit (HOSPITAL_BASED_OUTPATIENT_CLINIC_OR_DEPARTMENT_OTHER): Payer: BC Managed Care – PPO

## 2014-08-02 VITALS — BP 125/73 | HR 65 | Temp 98.6°F | Resp 16

## 2014-08-02 DIAGNOSIS — D693 Immune thrombocytopenic purpura: Secondary | ICD-10-CM

## 2014-08-02 DIAGNOSIS — Z5112 Encounter for antineoplastic immunotherapy: Secondary | ICD-10-CM

## 2014-08-02 LAB — CBC WITH DIFFERENTIAL (CANCER CENTER ONLY)
BASO#: 0 10*3/uL (ref 0.0–0.2)
BASO%: 0.7 % (ref 0.0–2.0)
EOS%: 3.2 % (ref 0.0–7.0)
Eosinophils Absolute: 0.2 10*3/uL (ref 0.0–0.5)
HCT: 30.1 % — ABNORMAL LOW (ref 38.7–49.9)
HEMOGLOBIN: 10 g/dL — AB (ref 13.0–17.1)
LYMPH#: 0.9 10*3/uL (ref 0.9–3.3)
LYMPH%: 15.4 % (ref 14.0–48.0)
MCH: 29.1 pg (ref 28.0–33.4)
MCHC: 33.2 g/dL (ref 32.0–35.9)
MCV: 88 fL (ref 82–98)
MONO#: 0.6 10*3/uL (ref 0.1–0.9)
MONO%: 10.6 % (ref 0.0–13.0)
NEUT%: 70.1 % (ref 40.0–80.0)
NEUTROS ABS: 4 10*3/uL (ref 1.5–6.5)
RBC: 3.44 10*6/uL — AB (ref 4.20–5.70)
RDW: 15.6 % (ref 11.1–15.7)
WBC: 5.7 10*3/uL (ref 4.0–10.0)

## 2014-08-02 LAB — TECHNOLOGIST REVIEW CHCC SATELLITE

## 2014-08-02 MED ORDER — ACETAMINOPHEN 325 MG PO TABS
ORAL_TABLET | ORAL | Status: AC
  Filled 2014-08-02: qty 2

## 2014-08-02 MED ORDER — DIPHENHYDRAMINE HCL 25 MG PO CAPS
50.0000 mg | ORAL_CAPSULE | Freq: Once | ORAL | Status: AC
  Administered 2014-08-02: 50 mg via ORAL

## 2014-08-02 MED ORDER — ACETAMINOPHEN 325 MG PO TABS
650.0000 mg | ORAL_TABLET | Freq: Once | ORAL | Status: AC
  Administered 2014-08-02: 650 mg via ORAL

## 2014-08-02 MED ORDER — DIPHENHYDRAMINE HCL 25 MG PO CAPS
ORAL_CAPSULE | ORAL | Status: AC
  Filled 2014-08-02: qty 2

## 2014-08-02 MED ORDER — SODIUM CHLORIDE 0.9 % IV SOLN: 375.0000 mg/m2 | Freq: Once | INTRAVENOUS | Status: DC

## 2014-08-02 MED ORDER — SODIUM CHLORIDE 0.9 % IV SOLN
800.0000 mg | Freq: Once | INTRAVENOUS | Status: AC
  Administered 2014-08-02: 800 mg via INTRAVENOUS
  Filled 2014-08-02: qty 80

## 2014-08-02 MED ORDER — ROMIPLOSTIM INJECTION 500 MCG
600.0000 ug | Freq: Once | SUBCUTANEOUS | Status: AC
  Administered 2014-08-02: 600 ug via SUBCUTANEOUS
  Filled 2014-08-02: qty 1.2

## 2014-08-02 MED ORDER — SODIUM CHLORIDE 0.9 % IV SOLN
Freq: Once | INTRAVENOUS | Status: AC
  Administered 2014-08-02: 09:00:00 via INTRAVENOUS

## 2014-08-02 MED ORDER — SODIUM CHLORIDE 0.9 % IV SOLN: 800.0000 mg | Freq: Once | INTRAVENOUS | Status: DC

## 2014-08-02 NOTE — Patient Instructions (Signed)
Romiplostim injection What is this medicine? ROMIPLOSTIM (roe mi PLOE stim) helps your body make more platelets. This medicine is used to treat low platelets caused by chronic idiopathic thrombocytopenic purpura (ITP). This medicine may be used for other purposes; ask your health care provider or pharmacist if you have questions. COMMON BRAND NAME(S): Nplate What should I tell my health care provider before I take this medicine? They need to know if you have any of these conditions: -cancer or myelodysplastic syndrome -low blood counts, like low white cell, platelet, or red cell counts -take medicines that treat or prevent blood clots -an unusual or allergic reaction to romiplostim, mannitol, other medicines, foods, dyes, or preservatives -pregnant or trying to get pregnant -breast-feeding How should I use this medicine? This medicine is for injection under the skin. It is given by a health care professional in a hospital or clinic setting. A special MedGuide will be given to you before your injection. Read this information carefully each time. Talk to your pediatrician regarding the use of this medicine in children. Special care may be needed. Overdosage: If you think you have taken too much of this medicine contact a poison control center or emergency room at once. NOTE: This medicine is only for you. Do not share this medicine with others. What if I miss a dose? It is important not to miss your dose. Call your doctor or health care professional if you are unable to keep an appointment. What may interact with this medicine? Interactions are not expected. This list may not describe all possible interactions. Give your health care provider a list of all the medicines, herbs, non-prescription drugs, or dietary supplements you use. Also tell them if you smoke, drink alcohol, or use illegal drugs. Some items may interact with your medicine. What should I watch for while using this  medicine? Your condition will be monitored carefully while you are receiving this medicine. Visit your prescriber or health care professional for regular checks on your progress and for the needed blood tests. It is important to keep all appointments. What side effects may I notice from receiving this medicine? Side effects that you should report to your doctor or health care professional as soon as possible: -allergic reactions like skin rash, itching or hives, swelling of the face, lips, or tongue -shortness of breath, chest pain, swelling in a leg -unusual bleeding or bruising Side effects that usually do not require medical attention (report to your doctor or health care professional if they continue or are bothersome): -dizziness -headache -muscle aches -pain in arms and legs -stomach pain -trouble sleeping This list may not describe all possible side effects. Call your doctor for medical advice about side effects. You may report side effects to FDA at 1-800-FDA-1088. Where should I keep my medicine? This drug is given in a hospital or clinic and will not be stored at home. NOTE: This sheet is a summary. It may not cover all possible information. If you have questions about this medicine, talk to your doctor, pharmacist, or health care provider.  2015, Elsevier/Gold Standard. (2008-07-11 15:13:04) Rituximab injection What is this medicine? RITUXIMAB (ri TUX i mab) is a monoclonal antibody. This medicine changes the way the body's immune system works. It is used commonly to treat non-Hodgkin's lymphoma and other conditions. In cancer cells, this drug targets a specific protein within cancer cells and stops the cancer cells from growing. It is also used to treat rhuematoid arthritis (RA). In RA, this medicine slow the  inflammatory process and help reduce joint pain and swelling. This medicine is often used with other cancer or arthritis medications. This medicine may be used for other  purposes; ask your health care provider or pharmacist if you have questions. COMMON BRAND NAME(S): Rituxan What should I tell my health care provider before I take this medicine? They need to know if you have any of these conditions: -blood disorders -heart disease -history of hepatitis B -infection (especially a virus infection such as chickenpox, cold sores, or herpes) -irregular heartbeat -kidney disease -lung or breathing disease, like asthma -lupus -an unusual or allergic reaction to rituximab, mouse proteins, other medicines, foods, dyes, or preservatives -pregnant or trying to get pregnant -breast-feeding How should I use this medicine? This medicine is for infusion into a vein. It is administered in a hospital or clinic by a specially trained health care professional. A special MedGuide will be given to you by the pharmacist with each prescription and refill. Be sure to read this information carefully each time. Talk to your pediatrician regarding the use of this medicine in children. This medicine is not approved for use in children. Overdosage: If you think you have taken too much of this medicine contact a poison control center or emergency room at once. NOTE: This medicine is only for you. Do not share this medicine with others. What if I miss a dose? It is important not to miss a dose. Call your doctor or health care professional if you are unable to keep an appointment. What may interact with this medicine? -cisplatin -medicines for blood pressure -some other medicines for arthritis -vaccines This list may not describe all possible interactions. Give your health care provider a list of all the medicines, herbs, non-prescription drugs, or dietary supplements you use. Also tell them if you smoke, drink alcohol, or use illegal drugs. Some items may interact with your medicine. What should I watch for while using this medicine? Report any side effects that you notice during  your treatment right away, such as changes in your breathing, fever, chills, dizziness or lightheadedness. These effects are more common with the first dose. Visit your prescriber or health care professional for checks on your progress. You will need to have regular blood work. Report any other side effects. The side effects of this medicine can continue after you finish your treatment. Continue your course of treatment even though you feel ill unless your doctor tells you to stop. Call your doctor or health care professional for advice if you get a fever, chills or sore throat, or other symptoms of a cold or flu. Do not treat yourself. This drug decreases your body's ability to fight infections. Try to avoid being around people who are sick. This medicine may increase your risk to bruise or bleed. Call your doctor or health care professional if you notice any unusual bleeding. Be careful brushing and flossing your teeth or using a toothpick because you may get an infection or bleed more easily. If you have any dental work done, tell your dentist you are receiving this medicine. Avoid taking products that contain aspirin, acetaminophen, ibuprofen, naproxen, or ketoprofen unless instructed by your doctor. These medicines may hide a fever. Do not become pregnant while taking this medicine. Women should inform their doctor if they wish to become pregnant or think they might be pregnant. There is a potential for serious side effects to an unborn child. Talk to your health care professional or pharmacist for more information. Do not  breast-feed an infant while taking this medicine. What side effects may I notice from receiving this medicine? Side effects that you should report to your doctor or health care professional as soon as possible: -allergic reactions like skin rash, itching or hives, swelling of the face, lips, or tongue -low blood counts - this medicine may decrease the number of white blood cells,  red blood cells and platelets. You may be at increased risk for infections and bleeding. -signs of infection - fever or chills, cough, sore throat, pain or difficulty passing urine -signs of decreased platelets or bleeding - bruising, pinpoint red spots on the skin, black, tarry stools, blood in the urine -signs of decreased red blood cells - unusually weak or tired, fainting spells, lightheadedness -breathing problems -confused, not responsive -chest pain -fast, irregular heartbeat -feeling faint or lightheaded, falls -mouth sores -redness, blistering, peeling or loosening of the skin, including inside the mouth -stomach pain -swelling of the ankles, feet, or hands -trouble passing urine or change in the amount of urine Side effects that usually do not require medical attention (report to your doctor or other health care professional if they continue or are bothersome): -anxiety -headache -loss of appetite -muscle aches -nausea -night sweats This list may not describe all possible side effects. Call your doctor for medical advice about side effects. You may report side effects to FDA at 1-800-FDA-1088. Where should I keep my medicine? This drug is given in a hospital or clinic and will not be stored at home. NOTE: This sheet is a summary. It may not cover all possible information. If you have questions about this medicine, talk to your doctor, pharmacist, or health care provider.  2015, Elsevier/Gold Standard. (2008-07-11 14:04:59)

## 2014-08-02 NOTE — Progress Notes (Signed)
Lab notified nurse that PLT< 6,000. MD informed of this critical value.

## 2014-08-02 NOTE — Telephone Encounter (Signed)
[employer] [place] - PRIME [employer] APPROVAL  Auth Nbr: [id number] Status: Approved  Dates: 07/12/2014-07/12/2015 CPT: J9310 - RITUXAN 10mg /ml INFUSE 800mg  biweekly    P: [phone]  F: [phone]  COPY SCANNED

## 2014-08-08 ENCOUNTER — Ambulatory Visit (INDEPENDENT_AMBULATORY_CARE_PROVIDER_SITE_OTHER): Payer: BC Managed Care – PPO | Admitting: Internal Medicine

## 2014-08-08 ENCOUNTER — Encounter: Payer: Self-pay | Admitting: Internal Medicine

## 2014-08-08 VITALS — BP 136/70 | Temp 99.0°F | Ht 67.5 in | Wt 212.0 lb

## 2014-08-08 DIAGNOSIS — E785 Hyperlipidemia, unspecified: Secondary | ICD-10-CM

## 2014-08-08 DIAGNOSIS — R739 Hyperglycemia, unspecified: Secondary | ICD-10-CM

## 2014-08-08 DIAGNOSIS — D696 Thrombocytopenia, unspecified: Secondary | ICD-10-CM

## 2014-08-08 DIAGNOSIS — R7309 Other abnormal glucose: Secondary | ICD-10-CM

## 2014-08-08 DIAGNOSIS — I10 Essential (primary) hypertension: Secondary | ICD-10-CM

## 2014-08-08 LAB — GLUCOSE, POCT (MANUAL RESULT ENTRY): POC GLUCOSE: 107 mg/dL — AB (ref 70–99)

## 2014-08-08 NOTE — Progress Notes (Signed)
Pre visit review using our clinic review tool, if applicable. No additional management support is needed unless otherwise documented below in the visit note.  No chief complaint on file.   HPI: Scott Waters isa  58 yo with r recent diagnosis of severe thrombocytopenia  ITP treated with steroids IVIG and now immune therapy. Currently no active bleeding but sometimes gets nosebleeds. Skin changes to work. He does have anemia with this somewhat fatigued but continues his regular activities HT:  Taking readings  .     No se of meds seems to be in control  Slight edema. No cough Gi neg pulm negative.  Fatigue  ocas Lipid med ok  s winded. Walking 8- 10 miles per day job.  No acute changes . States bg in hospital was ok  No gout  Episodes.  Taking  Med for joints . Glucosamine chondriotin. Knows not to take aspirin or anti-inflammatories. He states that his joints felt so much better when he was on the steroids for his low platelet count.  Last visit per heme  ASSESSMENT/PLAN: Scott Waters is 58 year old gentleman with refractory thrombocytopenia. We have tried several courses of treatment for him and unfortunately nothing seems to be working. He has had a transient response to IVIG but did not have a great response to steroids.  His platelets today are less than 6 and her is symptomatic with nose and gum bleeding.  We will give him Nplate and Rituxan today. Hopefully this will begin to help.  We will see him back next week for labs and follow-up.  He has his appointment schedule.  He knows to call with any questions or concerns. We can certainly see him sooner if need be.  Scott M, NP 07/27/2014 10:00 AM  ROS: See pertinent positives and negatives per HPI.  Past Medical History  Diagnosis Date  . Seasonal allergies   . Hypertension   . Hyperlipidemia   . Hx of colonic polyps 2007  . Gout 04/27/2011    One episode of joint pain at [facility]   Presumed    . Abnormal LFTs 04/27/2011  .  Thrombocytopenia 04/15/2014    admitted for IVIG  . GERD (gastroesophageal reflux disease)     "mild"  . Osteoarthritis   . Arthritis     "knees" (04/16/2014)  . ITP (idiopathic thrombocytopenic purpura) 04/30/2014    Family History  Problem Relation Age of Onset  . Colon cancer Father 36    Died at 46  . Hypertension Mother   . Diabetes Mother   . Kidney disease Mother     diailysis from dm   . Stroke      History   Social History  . Marital Status: Married    Spouse Name: N/A    Number of Children: N/A  . Years of Education: N/A   Social History Main Topics  . Smoking status: Former Smoker -- 1.00 packs/day for 33 years    Types: Cigarettes    Start date: 10/02/1970    Quit date: 01/02/2003  . Smokeless tobacco: Former Systems developer    Quit date: 10/03/2003     Comment: quit all tobacco  11 years ago  . Alcohol Use: 16.8 oz/week    14 Cans of beer, 14 Shots of liquor per week     Comment: 04/16/2014 "2, 12oz beers/day plus 2 shots liquor/day"  . Drug Use: Yes     Comment: "last drug use was in ~ 1995; marijuana"  . Sexual Activity:  Yes   Other Topics Concern  . 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 amLODipine-olmesartan (AZOR) 10-40 MG per tablet Take 1 tablet by mouth daily.  . fluticasone (FLONASE) 50 MCG/ACT nasal spray Place 2 sprays into both nostrils daily as needed for allergies.  . folic acid (FOLVITE) 1 MG tablet Take 1 tablet (1 mg total) by mouth daily.  . Multiple Vitamin (MULTIVITAMIN WITH MINERALS) TABS tablet Take 1 tablet by mouth daily.  . pantoprazole (PROTONIX) 40 MG tablet Take 1 tablet (40 mg total) by mouth daily.  . potassium chloride (K-DUR,KLOR-CON) 10 MEQ tablet take 3 tablets by mouth once daily  . simvastatin (ZOCOR) 40 MG  tablet Take 40 mg by mouth daily.  Marland Kitchen VITAMIN K PO Take 100 mcg by mouth every morning. OTC med    EXAM:  BP 136/70  Temp(Src) 99 F (37.2 C) (Oral)  Ht 5' 7.5" (1.715 Waters)  Wt 212 lb (96.163 kg)  BMI 32.69 kg/m2  Body mass index is 32.69 kg/(Waters^2).  GENERAL: vitals reviewed and listed above, alert, oriented, appears well hydrated and in no acute distress HEENT: atraumatic, conjunctiva  clear, no obvious abnormalities on inspection of external nose and ears  NECK: no obvious masses on inspection palpation  LUNGS: clear to auscultation bilaterally, no wheezes, rales or rhonchi, good air movement CV: HRRR, no clubbing cyanosis slight 1= edema near sock line   peripheral edema nl cap refill  MS: moves all extremities without noticeable focal  abnormalityno acitve bleeding noted  PSYCH: pleasant and cooperative, no obvious depression or anxiety Lab Results  Component Value Date   WBC 5.7 08/02/2014   HGB 10.0* 08/02/2014   HCT 30.1* 08/02/2014   PLT <6* 08/02/2014   GLUCOSE 133* 07/20/2014   CHOL 144 04/15/2014   TRIG 142.0 04/15/2014   HDL 39.90 04/15/2014   LDLDIRECT 73.0 04/13/2010   LDLCALC 76 04/15/2014   ALT 36 07/20/2014   AST 51* 07/20/2014   NA 139 07/20/2014   K 3.3 07/20/2014   CL 103 07/20/2014   CREATININE 0.9 07/20/2014   BUN 11 07/20/2014   CO2 27 07/20/2014   TSH 1.69 04/15/2014   PSA 1.60 10/01/2012   INR 0.97 04/28/2014    ASSESSMENT AND PLAN:  Discussed the following assessment and plan:  Unspecified essential hypertension - vcontolled  continue   Other and unspecified hyperlipidemia  Thrombocytopenia, unspecified - Severe diagnosed ITP under hematology care  Elevated blood sugar - non fasting follow may have been related to steroids it is normal 2 hours postprandial today - Plan: POC Glucose (CBG)  -Patient advised to return or notify health care team  if symptoms worsen ,persist or new concerns arise.  Patient Instructions  Continue same medications. Advise flu  vaccine at appropriate time ask hematology .  Continue lifestyle intervention healthy eating and exercise . Avoid exces sweets and sugars . Wellness visit next MAy 2016 but fu at any time with concerns    Standley Brooking. Panosh Waters.D.

## 2014-08-08 NOTE — Patient Instructions (Signed)
Continue same medications. Advise flu vaccine at appropriate time ask hematology .  Continue lifestyle intervention healthy eating and exercise . Avoid exces sweets and sugars . Wellness visit next MAy 2016 but fu at any time with concerns

## 2014-08-10 ENCOUNTER — Other Ambulatory Visit: Payer: Self-pay | Admitting: Family

## 2014-08-10 ENCOUNTER — Other Ambulatory Visit (HOSPITAL_BASED_OUTPATIENT_CLINIC_OR_DEPARTMENT_OTHER): Payer: BC Managed Care – PPO | Admitting: Lab

## 2014-08-10 ENCOUNTER — Telehealth: Payer: Self-pay | Admitting: Hematology & Oncology

## 2014-08-10 ENCOUNTER — Ambulatory Visit (HOSPITAL_BASED_OUTPATIENT_CLINIC_OR_DEPARTMENT_OTHER): Payer: BC Managed Care – PPO

## 2014-08-10 VITALS — BP 148/74 | HR 78 | Temp 98.2°F | Resp 18

## 2014-08-10 DIAGNOSIS — Z5112 Encounter for antineoplastic immunotherapy: Secondary | ICD-10-CM

## 2014-08-10 DIAGNOSIS — D693 Immune thrombocytopenic purpura: Secondary | ICD-10-CM

## 2014-08-10 LAB — CBC WITH DIFFERENTIAL (CANCER CENTER ONLY)
BASO#: 0 10*3/uL (ref 0.0–0.2)
BASO%: 0.8 % (ref 0.0–2.0)
EOS ABS: 0.3 10*3/uL (ref 0.0–0.5)
EOS%: 9.3 % — AB (ref 0.0–7.0)
HEMATOCRIT: 31.3 % — AB (ref 38.7–49.9)
HEMOGLOBIN: 10.3 g/dL — AB (ref 13.0–17.1)
LYMPH#: 0.8 10*3/uL — ABNORMAL LOW (ref 0.9–3.3)
LYMPH%: 22.4 % (ref 14.0–48.0)
MCH: 28.1 pg (ref 28.0–33.4)
MCHC: 32.9 g/dL (ref 32.0–35.9)
MCV: 86 fL (ref 82–98)
MONO#: 0.5 10*3/uL (ref 0.1–0.9)
MONO%: 13.1 % — ABNORMAL HIGH (ref 0.0–13.0)
NEUT%: 54.4 % (ref 40.0–80.0)
NEUTROS ABS: 2 10*3/uL (ref 1.5–6.5)
Platelets: 12 10*3/uL — ABNORMAL LOW (ref 145–400)
RBC: 3.66 10*6/uL — AB (ref 4.20–5.70)
RDW: 14.8 % (ref 11.1–15.7)
WBC: 3.7 10*3/uL — ABNORMAL LOW (ref 4.0–10.0)

## 2014-08-10 LAB — TECHNOLOGIST REVIEW CHCC SATELLITE

## 2014-08-10 MED ORDER — ACETAMINOPHEN 325 MG PO TABS
ORAL_TABLET | ORAL | Status: AC
  Filled 2014-08-10: qty 2

## 2014-08-10 MED ORDER — DIPHENHYDRAMINE HCL 25 MG PO CAPS
ORAL_CAPSULE | ORAL | Status: AC
  Filled 2014-08-10: qty 2

## 2014-08-10 MED ORDER — RITUXIMAB CHEMO INJECTION 10 MG/ML
800.0000 mg | Freq: Once | INTRAVENOUS | Status: AC
  Administered 2014-08-10: 800 mg via INTRAVENOUS
  Filled 2014-08-10: qty 80

## 2014-08-10 MED ORDER — SODIUM CHLORIDE 0.9 % IV SOLN
Freq: Once | INTRAVENOUS | Status: AC
  Administered 2014-08-10: 11:00:00 via INTRAVENOUS

## 2014-08-10 MED ORDER — ACETAMINOPHEN 325 MG PO TABS
650.0000 mg | ORAL_TABLET | Freq: Once | ORAL | Status: AC
  Administered 2014-08-10: 650 mg via ORAL

## 2014-08-10 MED ORDER — ROMIPLOSTIM INJECTION 500 MCG
700.0000 ug | Freq: Once | SUBCUTANEOUS | Status: AC
  Administered 2014-08-10: 700 ug via SUBCUTANEOUS
  Filled 2014-08-10: qty 1.4

## 2014-08-10 MED ORDER — DIPHENHYDRAMINE HCL 25 MG PO CAPS
50.0000 mg | ORAL_CAPSULE | Freq: Once | ORAL | Status: AC
  Administered 2014-08-10: 50 mg via ORAL

## 2014-08-10 NOTE — Telephone Encounter (Signed)
Per Theodosia Paling to cx 08/16/14 apt

## 2014-08-10 NOTE — Patient Instructions (Signed)

## 2014-08-16 ENCOUNTER — Ambulatory Visit: Payer: BC Managed Care – PPO

## 2014-08-17 ENCOUNTER — Ambulatory Visit (HOSPITAL_BASED_OUTPATIENT_CLINIC_OR_DEPARTMENT_OTHER): Payer: BC Managed Care – PPO

## 2014-08-17 ENCOUNTER — Other Ambulatory Visit (HOSPITAL_BASED_OUTPATIENT_CLINIC_OR_DEPARTMENT_OTHER): Payer: BC Managed Care – PPO | Admitting: Lab

## 2014-08-17 VITALS — BP 125/81 | HR 83 | Temp 98.2°F | Resp 18

## 2014-08-17 DIAGNOSIS — D693 Immune thrombocytopenic purpura: Secondary | ICD-10-CM

## 2014-08-17 LAB — CBC WITH DIFFERENTIAL (CANCER CENTER ONLY)
BASO#: 0 10*3/uL (ref 0.0–0.2)
BASO%: 0.8 % (ref 0.0–2.0)
EOS%: 5.8 % (ref 0.0–7.0)
Eosinophils Absolute: 0.3 10*3/uL (ref 0.0–0.5)
HCT: 31.1 % — ABNORMAL LOW (ref 38.7–49.9)
HEMOGLOBIN: 10.4 g/dL — AB (ref 13.0–17.1)
LYMPH#: 0.8 10*3/uL — AB (ref 0.9–3.3)
LYMPH%: 16.1 % (ref 14.0–48.0)
MCH: 28.3 pg (ref 28.0–33.4)
MCHC: 33.4 g/dL (ref 32.0–35.9)
MCV: 85 fL (ref 82–98)
MONO#: 0.6 10*3/uL (ref 0.1–0.9)
MONO%: 13 % (ref 0.0–13.0)
NEUT#: 3.1 10*3/uL (ref 1.5–6.5)
NEUT%: 64.3 % (ref 40.0–80.0)
Platelets: 7 10*3/uL — CL (ref 145–400)
RBC: 3.68 10*6/uL — ABNORMAL LOW (ref 4.20–5.70)
RDW: 14.9 % (ref 11.1–15.7)
WBC: 4.8 10*3/uL (ref 4.0–10.0)

## 2014-08-17 LAB — TECHNOLOGIST REVIEW CHCC SATELLITE

## 2014-08-17 MED ORDER — ROMIPLOSTIM INJECTION 500 MCG
800.0000 ug | Freq: Once | SUBCUTANEOUS | Status: AC
  Administered 2014-08-17: 800 ug via SUBCUTANEOUS
  Filled 2014-08-17: qty 1.6

## 2014-08-17 NOTE — Patient Instructions (Signed)
Romiplostim injection What is this medicine? ROMIPLOSTIM (roe mi PLOE stim) helps your body make more platelets. This medicine is used to treat low platelets caused by chronic idiopathic thrombocytopenic purpura (ITP). This medicine may be used for other purposes; ask your health care provider or pharmacist if you have questions. COMMON BRAND NAME(S): Nplate What should I tell my health care provider before I take this medicine? They need to know if you have any of these conditions: -cancer or myelodysplastic syndrome -low blood counts, like low white cell, platelet, or red cell counts -take medicines that treat or prevent blood clots -an unusual or allergic reaction to romiplostim, mannitol, other medicines, foods, dyes, or preservatives -pregnant or trying to get pregnant -breast-feeding How should I use this medicine? This medicine is for injection under the skin. It is given by a health care professional in a hospital or clinic setting. A special MedGuide will be given to you before your injection. Read this information carefully each time. Talk to your pediatrician regarding the use of this medicine in children. Special care may be needed. Overdosage: If you think you have taken too much of this medicine contact a poison control center or emergency room at once. NOTE: This medicine is only for you. Do not share this medicine with others. What if I miss a dose? It is important not to miss your dose. Call your doctor or health care professional if you are unable to keep an appointment. What may interact with this medicine? Interactions are not expected. This list may not describe all possible interactions. Give your health care provider a list of all the medicines, herbs, non-prescription drugs, or dietary supplements you use. Also tell them if you smoke, drink alcohol, or use illegal drugs. Some items may interact with your medicine. What should I watch for while using this  medicine? Your condition will be monitored carefully while you are receiving this medicine. Visit your prescriber or health care professional for regular checks on your progress and for the needed blood tests. It is important to keep all appointments. What side effects may I notice from receiving this medicine? Side effects that you should report to your doctor or health care professional as soon as possible: -allergic reactions like skin rash, itching or hives, swelling of the face, lips, or tongue -shortness of breath, chest pain, swelling in a leg -unusual bleeding or bruising Side effects that usually do not require medical attention (report to your doctor or health care professional if they continue or are bothersome): -dizziness -headache -muscle aches -pain in arms and legs -stomach pain -trouble sleeping This list may not describe all possible side effects. Call your doctor for medical advice about side effects. You may report side effects to FDA at 1-800-FDA-1088. Where should I keep my medicine? This drug is given in a hospital or clinic and will not be stored at home. NOTE: This sheet is a summary. It may not cover all possible information. If you have questions about this medicine, talk to your doctor, pharmacist, or health care provider.  2015, Elsevier/Gold Standard. (2008-07-11 15:13:04)  

## 2014-08-24 ENCOUNTER — Ambulatory Visit (HOSPITAL_BASED_OUTPATIENT_CLINIC_OR_DEPARTMENT_OTHER): Payer: BC Managed Care – PPO

## 2014-08-24 ENCOUNTER — Other Ambulatory Visit (HOSPITAL_BASED_OUTPATIENT_CLINIC_OR_DEPARTMENT_OTHER): Payer: BC Managed Care – PPO | Admitting: Lab

## 2014-08-24 VITALS — BP 140/79 | HR 85 | Temp 98.6°F | Resp 18

## 2014-08-24 DIAGNOSIS — D693 Immune thrombocytopenic purpura: Secondary | ICD-10-CM

## 2014-08-24 LAB — COMPREHENSIVE METABOLIC PANEL
ALBUMIN: 4 g/dL (ref 3.5–5.2)
ALT: 33 U/L (ref 0–53)
AST: 42 U/L — ABNORMAL HIGH (ref 0–37)
Alkaline Phosphatase: 89 U/L (ref 39–117)
BUN: 16 mg/dL (ref 6–23)
CALCIUM: 9.1 mg/dL (ref 8.4–10.5)
CHLORIDE: 106 meq/L (ref 96–112)
CO2: 26 mEq/L (ref 19–32)
Creatinine, Ser: 1.08 mg/dL (ref 0.50–1.35)
GLUCOSE: 97 mg/dL (ref 70–99)
POTASSIUM: 3.6 meq/L (ref 3.5–5.3)
SODIUM: 140 meq/L (ref 135–145)
TOTAL PROTEIN: 6.4 g/dL (ref 6.0–8.3)
Total Bilirubin: 0.4 mg/dL (ref 0.2–1.2)

## 2014-08-24 LAB — CBC WITH DIFFERENTIAL (CANCER CENTER ONLY)
BASO#: 0.1 10*3/uL (ref 0.0–0.2)
BASO%: 1.1 % (ref 0.0–2.0)
EOS%: 5.6 % (ref 0.0–7.0)
Eosinophils Absolute: 0.3 10*3/uL (ref 0.0–0.5)
HCT: 32.3 % — ABNORMAL LOW (ref 38.7–49.9)
HGB: 10.5 g/dL — ABNORMAL LOW (ref 13.0–17.1)
LYMPH#: 1 10*3/uL (ref 0.9–3.3)
LYMPH%: 18.4 % (ref 14.0–48.0)
MCH: 27.9 pg — ABNORMAL LOW (ref 28.0–33.4)
MCHC: 32.5 g/dL (ref 32.0–35.9)
MCV: 86 fL (ref 82–98)
MONO#: 0.9 10*3/uL (ref 0.1–0.9)
MONO%: 15.9 % — ABNORMAL HIGH (ref 0.0–13.0)
NEUT%: 59 % (ref 40.0–80.0)
NEUTROS ABS: 3.1 10*3/uL (ref 1.5–6.5)
PLATELETS: 83 10*3/uL — AB (ref 145–400)
RBC: 3.77 10*6/uL — AB (ref 4.20–5.70)
RDW: 14.7 % (ref 11.1–15.7)
WBC: 5.3 10*3/uL (ref 4.0–10.0)

## 2014-08-24 LAB — TECHNOLOGIST REVIEW CHCC SATELLITE

## 2014-08-24 MED ORDER — DARBEPOETIN ALFA-POLYSORBATE 300 MCG/0.6ML IJ SOLN
INTRAMUSCULAR | Status: AC
  Filled 2014-08-24: qty 0.6

## 2014-08-24 MED ORDER — TESTOSTERONE CYPIONATE 200 MG/ML IM SOLN
INTRAMUSCULAR | Status: AC
  Filled 2014-08-24: qty 2

## 2014-08-24 MED ORDER — ROMIPLOSTIM INJECTION 500 MCG
800.0000 ug | Freq: Once | SUBCUTANEOUS | Status: AC
  Administered 2014-08-24: 800 ug via SUBCUTANEOUS
  Filled 2014-08-24: qty 1.6

## 2014-08-24 NOTE — Patient Instructions (Signed)
Romiplostim injection What is this medicine? ROMIPLOSTIM (roe mi PLOE stim) helps your body make more platelets. This medicine is used to treat low platelets caused by chronic idiopathic thrombocytopenic purpura (ITP). This medicine may be used for other purposes; ask your health care provider or pharmacist if you have questions. COMMON BRAND NAME(S): Nplate What should I tell my health care provider before I take this medicine? They need to know if you have any of these conditions: -cancer or myelodysplastic syndrome -low blood counts, like low white cell, platelet, or red cell counts -take medicines that treat or prevent blood clots -an unusual or allergic reaction to romiplostim, mannitol, other medicines, foods, dyes, or preservatives -pregnant or trying to get pregnant -breast-feeding How should I use this medicine? This medicine is for injection under the skin. It is given by a health care professional in a hospital or clinic setting. A special MedGuide will be given to you before your injection. Read this information carefully each time. Talk to your pediatrician regarding the use of this medicine in children. Special care may be needed. Overdosage: If you think you have taken too much of this medicine contact a poison control center or emergency room at once. NOTE: This medicine is only for you. Do not share this medicine with others. What if I miss a dose? It is important not to miss your dose. Call your doctor or health care professional if you are unable to keep an appointment. What may interact with this medicine? Interactions are not expected. This list may not describe all possible interactions. Give your health care provider a list of all the medicines, herbs, non-prescription drugs, or dietary supplements you use. Also tell them if you smoke, drink alcohol, or use illegal drugs. Some items may interact with your medicine. What should I watch for while using this  medicine? Your condition will be monitored carefully while you are receiving this medicine. Visit your prescriber or health care professional for regular checks on your progress and for the needed blood tests. It is important to keep all appointments. What side effects may I notice from receiving this medicine? Side effects that you should report to your doctor or health care professional as soon as possible: -allergic reactions like skin rash, itching or hives, swelling of the face, lips, or tongue -shortness of breath, chest pain, swelling in a leg -unusual bleeding or bruising Side effects that usually do not require medical attention (report to your doctor or health care professional if they continue or are bothersome): -dizziness -headache -muscle aches -pain in arms and legs -stomach pain -trouble sleeping This list may not describe all possible side effects. Call your doctor for medical advice about side effects. You may report side effects to FDA at 1-800-FDA-1088. Where should I keep my medicine? This drug is given in a hospital or clinic and will not be stored at home. NOTE: This sheet is a summary. It may not cover all possible information. If you have questions about this medicine, talk to your doctor, pharmacist, or health care provider.  2015, Elsevier/Gold Standard. (2008-07-11 15:13:04)  

## 2014-08-31 ENCOUNTER — Ambulatory Visit (HOSPITAL_BASED_OUTPATIENT_CLINIC_OR_DEPARTMENT_OTHER): Payer: BC Managed Care – PPO

## 2014-08-31 ENCOUNTER — Other Ambulatory Visit (HOSPITAL_BASED_OUTPATIENT_CLINIC_OR_DEPARTMENT_OTHER): Payer: BC Managed Care – PPO | Admitting: Lab

## 2014-08-31 VITALS — BP 144/77 | HR 78 | Temp 98.7°F | Resp 18

## 2014-08-31 DIAGNOSIS — D693 Immune thrombocytopenic purpura: Secondary | ICD-10-CM

## 2014-08-31 LAB — CBC WITH DIFFERENTIAL (CANCER CENTER ONLY)
BASO#: 0 10*3/uL (ref 0.0–0.2)
BASO%: 0.9 % (ref 0.0–2.0)
EOS%: 5.7 % (ref 0.0–7.0)
Eosinophils Absolute: 0.3 10*3/uL (ref 0.0–0.5)
HCT: 33.1 % — ABNORMAL LOW (ref 38.7–49.9)
HEMOGLOBIN: 11.1 g/dL — AB (ref 13.0–17.1)
LYMPH#: 0.9 10*3/uL (ref 0.9–3.3)
LYMPH%: 19.4 % (ref 14.0–48.0)
MCH: 28 pg (ref 28.0–33.4)
MCHC: 33.5 g/dL (ref 32.0–35.9)
MCV: 83 fL (ref 82–98)
MONO#: 0.7 10*3/uL (ref 0.1–0.9)
MONO%: 14.4 % — ABNORMAL HIGH (ref 0.0–13.0)
NEUT#: 2.7 10*3/uL (ref 1.5–6.5)
NEUT%: 59.6 % (ref 40.0–80.0)
Platelets: 90 10*3/uL — ABNORMAL LOW (ref 145–400)
RBC: 3.97 10*6/uL — ABNORMAL LOW (ref 4.20–5.70)
RDW: 14.4 % (ref 11.1–15.7)
WBC: 4.6 10*3/uL (ref 4.0–10.0)

## 2014-08-31 MED ORDER — ROMIPLOSTIM INJECTION 500 MCG
800.0000 ug | Freq: Once | SUBCUTANEOUS | Status: AC
  Administered 2014-08-31: 800 ug via SUBCUTANEOUS
  Filled 2014-08-31: qty 1.6

## 2014-08-31 NOTE — Patient Instructions (Signed)
Romiplostim injection What is this medicine? ROMIPLOSTIM (roe mi PLOE stim) helps your body make more platelets. This medicine is used to treat low platelets caused by chronic idiopathic thrombocytopenic purpura (ITP). This medicine may be used for other purposes; ask your health care provider or pharmacist if you have questions. COMMON BRAND NAME(S): Nplate What should I tell my health care provider before I take this medicine? They need to know if you have any of these conditions: -cancer or myelodysplastic syndrome -low blood counts, like low white cell, platelet, or red cell counts -take medicines that treat or prevent blood clots -an unusual or allergic reaction to romiplostim, mannitol, other medicines, foods, dyes, or preservatives -pregnant or trying to get pregnant -breast-feeding How should I use this medicine? This medicine is for injection under the skin. It is given by a health care professional in a hospital or clinic setting. A special MedGuide will be given to you before your injection. Read this information carefully each time. Talk to your pediatrician regarding the use of this medicine in children. Special care may be needed. Overdosage: If you think you have taken too much of this medicine contact a poison control center or emergency room at once. NOTE: This medicine is only for you. Do not share this medicine with others. What if I miss a dose? It is important not to miss your dose. Call your doctor or health care professional if you are unable to keep an appointment. What may interact with this medicine? Interactions are not expected. This list may not describe all possible interactions. Give your health care provider a list of all the medicines, herbs, non-prescription drugs, or dietary supplements you use. Also tell them if you smoke, drink alcohol, or use illegal drugs. Some items may interact with your medicine. What should I watch for while using this  medicine? Your condition will be monitored carefully while you are receiving this medicine. Visit your prescriber or health care professional for regular checks on your progress and for the needed blood tests. It is important to keep all appointments. What side effects may I notice from receiving this medicine? Side effects that you should report to your doctor or health care professional as soon as possible: -allergic reactions like skin rash, itching or hives, swelling of the face, lips, or tongue -shortness of breath, chest pain, swelling in a leg -unusual bleeding or bruising Side effects that usually do not require medical attention (report to your doctor or health care professional if they continue or are bothersome): -dizziness -headache -muscle aches -pain in arms and legs -stomach pain -trouble sleeping This list may not describe all possible side effects. Call your doctor for medical advice about side effects. You may report side effects to FDA at 1-800-FDA-1088. Where should I keep my medicine? This drug is given in a hospital or clinic and will not be stored at home. NOTE: This sheet is a summary. It may not cover all possible information. If you have questions about this medicine, talk to your doctor, pharmacist, or health care provider.  2015, Elsevier/Gold Standard. (2008-07-11 15:13:04)  

## 2014-09-07 ENCOUNTER — Encounter: Payer: Self-pay | Admitting: Hematology & Oncology

## 2014-09-07 ENCOUNTER — Ambulatory Visit (HOSPITAL_BASED_OUTPATIENT_CLINIC_OR_DEPARTMENT_OTHER): Payer: BC Managed Care – PPO | Admitting: Hematology & Oncology

## 2014-09-07 ENCOUNTER — Other Ambulatory Visit (HOSPITAL_BASED_OUTPATIENT_CLINIC_OR_DEPARTMENT_OTHER): Payer: BC Managed Care – PPO | Admitting: Lab

## 2014-09-07 ENCOUNTER — Ambulatory Visit (HOSPITAL_BASED_OUTPATIENT_CLINIC_OR_DEPARTMENT_OTHER): Payer: BC Managed Care – PPO

## 2014-09-07 VITALS — BP 128/79 | HR 78 | Temp 98.3°F | Resp 16

## 2014-09-07 VITALS — BP 128/79 | HR 78 | Temp 97.6°F | Resp 18 | Ht 67.0 in | Wt 213.0 lb

## 2014-09-07 DIAGNOSIS — D693 Immune thrombocytopenic purpura: Secondary | ICD-10-CM

## 2014-09-07 DIAGNOSIS — D696 Thrombocytopenia, unspecified: Secondary | ICD-10-CM

## 2014-09-07 LAB — CBC WITH DIFFERENTIAL (CANCER CENTER ONLY)
BASO#: 0.1 10*3/uL (ref 0.0–0.2)
BASO%: 1.1 % (ref 0.0–2.0)
EOS%: 6 % (ref 0.0–7.0)
Eosinophils Absolute: 0.3 10*3/uL (ref 0.0–0.5)
HEMATOCRIT: 31.4 % — AB (ref 38.7–49.9)
HEMOGLOBIN: 10.6 g/dL — AB (ref 13.0–17.1)
LYMPH#: 1 10*3/uL (ref 0.9–3.3)
LYMPH%: 17.9 % (ref 14.0–48.0)
MCH: 27.5 pg — AB (ref 28.0–33.4)
MCHC: 33.8 g/dL (ref 32.0–35.9)
MCV: 82 fL (ref 82–98)
MONO#: 0.7 10*3/uL (ref 0.1–0.9)
MONO%: 13.8 % — AB (ref 0.0–13.0)
NEUT#: 3.2 10*3/uL (ref 1.5–6.5)
NEUT%: 61.2 % (ref 40.0–80.0)
Platelets: 17 10*3/uL — ABNORMAL LOW (ref 145–400)
RBC: 3.85 10*6/uL — ABNORMAL LOW (ref 4.20–5.70)
RDW: 14.3 % (ref 11.1–15.7)
WBC: 5.3 10*3/uL (ref 4.0–10.0)

## 2014-09-07 LAB — TECHNOLOGIST REVIEW CHCC SATELLITE

## 2014-09-07 MED ORDER — ROMIPLOSTIM INJECTION 500 MCG
900.0000 ug | Freq: Once | SUBCUTANEOUS | Status: AC
  Administered 2014-09-07: 900 ug via SUBCUTANEOUS
  Filled 2014-09-07: qty 1.8

## 2014-09-07 NOTE — Patient Instructions (Signed)
Romiplostim injection What is this medicine? ROMIPLOSTIM (roe mi PLOE stim) helps your body make more platelets. This medicine is used to treat low platelets caused by chronic idiopathic thrombocytopenic purpura (ITP). This medicine may be used for other purposes; ask your health care provider or pharmacist if you have questions. COMMON BRAND NAME(S): Nplate What should I tell my health care provider before I take this medicine? They need to know if you have any of these conditions: -cancer or myelodysplastic syndrome -low blood counts, like low white cell, platelet, or red cell counts -take medicines that treat or prevent blood clots -an unusual or allergic reaction to romiplostim, mannitol, other medicines, foods, dyes, or preservatives -pregnant or trying to get pregnant -breast-feeding How should I use this medicine? This medicine is for injection under the skin. It is given by a health care professional in a hospital or clinic setting. A special MedGuide will be given to you before your injection. Read this information carefully each time. Talk to your pediatrician regarding the use of this medicine in children. Special care may be needed. Overdosage: If you think you have taken too much of this medicine contact a poison control center or emergency room at once. NOTE: This medicine is only for you. Do not share this medicine with others. What if I miss a dose? It is important not to miss your dose. Call your doctor or health care professional if you are unable to keep an appointment. What may interact with this medicine? Interactions are not expected. This list may not describe all possible interactions. Give your health care provider a list of all the medicines, herbs, non-prescription drugs, or dietary supplements you use. Also tell them if you smoke, drink alcohol, or use illegal drugs. Some items may interact with your medicine. What should I watch for while using this  medicine? Your condition will be monitored carefully while you are receiving this medicine. Visit your prescriber or health care professional for regular checks on your progress and for the needed blood tests. It is important to keep all appointments. What side effects may I notice from receiving this medicine? Side effects that you should report to your doctor or health care professional as soon as possible: -allergic reactions like skin rash, itching or hives, swelling of the face, lips, or tongue -shortness of breath, chest pain, swelling in a leg -unusual bleeding or bruising Side effects that usually do not require medical attention (report to your doctor or health care professional if they continue or are bothersome): -dizziness -headache -muscle aches -pain in arms and legs -stomach pain -trouble sleeping This list may not describe all possible side effects. Call your doctor for medical advice about side effects. You may report side effects to FDA at 1-800-FDA-1088. Where should I keep my medicine? This drug is given in a hospital or clinic and will not be stored at home. NOTE: This sheet is a summary. It may not cover all possible information. If you have questions about this medicine, talk to your doctor, pharmacist, or health care provider.  2015, Elsevier/Gold Standard. (2008-07-11 15:13:04)  

## 2014-09-08 NOTE — Progress Notes (Signed)
Hematology and Oncology Follow Up Visit  Scott Waters [id number] May 04, 1956 58 y.o. 09/08/2014   Principle Diagnosis:  Refractory chronic immune thrombocytopenia Current Therapy:    Nplate-dose adjusted  Patient status post Rituxan x4 weeks     Interim History:  Mr.  Waters is back for another visit. He was asked to getting his Nplate today. Unfortunately, his platelet count is down less than 17,000. Last week it was 90K. He's had no bleeding. He's still working a little bit.  I wasn't very optimistic that his Rituxan had worked. That in combination with the Nplate I thought was finally getting his platelet count up.  I cannot remember the last time I saw a case of immune thrombocytopenia that was so refractory. He had a bone marrow biopsy done back in May. I think we have to repeat this. I talked him about this. We will set this up for October 28.  He is still working. He has had no fever. He's had no change in medications. He's had no nausea or vomiting. He he has had no change in bowel or bladder habits. He has had no headache.  Medications: Current outpatient prescriptions:amLODipine-olmesartan (AZOR) 10-40 MG per tablet, Take 1 tablet by mouth daily., Disp: , Rfl: ;  fluticasone (FLONASE) 50 MCG/ACT nasal spray, Place 2 sprays into both nostrils daily as needed for allergies., Disp: , Rfl: ;  folic acid (FOLVITE) 1 MG tablet, Take 1 tablet (1 mg total) by mouth daily., Disp: 30 tablet, Rfl: 6 Multiple Vitamin (MULTIVITAMIN WITH MINERALS) TABS tablet, Take 1 tablet by mouth daily., Disp: , Rfl: ;  pantoprazole (PROTONIX) 40 MG tablet, Take 1 tablet (40 mg total) by mouth daily., Disp: 30 tablet, Rfl: 6;  potassium chloride (K-DUR,KLOR-CON) 10 MEQ tablet, take 3 tablets by mouth once daily, Disp: , Rfl: ;  simvastatin (ZOCOR) 40 MG tablet, Take 40 mg by mouth daily., Disp: , Rfl:  VITAMIN K PO, Take 100 mcg by mouth every morning. OTC med, Disp: , Rfl:   Allergies: No Known  Allergies  Past Medical History, Surgical history, Social history, and Family History were reviewed and updated.  Review of Systems: As above  Physical Exam:  height is 5' 7"  (1.702 m) and weight is 213 lb (96.616 kg). His oral temperature is 97.6 F (36.4 C). His blood pressure is 128/79 and his pulse is 78. His respiration is 18.   He does have some petechia on his lower legs. There is no lymphadenopathy. There is no palpable liver or spleen. His lungs are clear. Cardiac exam regular rate and rhythm. Neurological exam is nonfocal. Head and neck exam shows no adenopathy. There is no petechia within the oral cavity. He has good strength in his extremities. She has no joint swelling.  Lab Results  Component Value Date   WBC 5.3 09/07/2014   HGB 10.6* 09/07/2014   HCT 31.4* 09/07/2014   MCV 82 09/07/2014   PLT 17* 09/07/2014     Chemistry      Component Value Date/Time   NA 140 08/24/2014 1054   NA 139 07/20/2014 0902   K 3.6 08/24/2014 1054   K 3.3 07/20/2014 0902   CL 106 08/24/2014 1054   CL 103 07/20/2014 0902   CO2 26 08/24/2014 1054   CO2 27 07/20/2014 0902   BUN 16 08/24/2014 1054   BUN 11 07/20/2014 0902   CREATININE 1.08 08/24/2014 1054   CREATININE 0.9 07/20/2014 0902      Component Value Date/Time  CALCIUM 9.1 08/24/2014 1054   CALCIUM 8.6 07/20/2014 0902   ALKPHOS 89 08/24/2014 1054   ALKPHOS 75 07/20/2014 0902   AST 42* 08/24/2014 1054   AST 51* 07/20/2014 0902   ALT 33 08/24/2014 1054   ALT 36 07/20/2014 0902   BILITOT 0.4 08/24/2014 1054   BILITOT 0.80 07/20/2014 0902         Impression and Plan: Scott Waters is 58 year old gentleman with refractory thrombocytopenia. I looked at his blood smear. I do not see anything that looked suspicious. We will continue him on Nplate. Go up on his dose 1 more unit.    I talked to him about the situation. I again explained why I thought that we needed to do another bone marrow test on him. He understands.  He's done everything  that we have asked him to do. We tried steroids. We have tried IVIG. We tried Rituxan.  I suppose that if we see nothing unusual on the next bone marrow biopsy, we could try the oral thrombopoietin agent- We will continue the Nplate weekly for now.  I will plan to see him back in another three out of four weeks.  This is becoming a lot more complicated than I would have ever thought.  I spent about 30 minutes with him.  Again, we will start on August 26.   [name], MD 10/15/20157:28 AM

## 2014-09-09 ENCOUNTER — Ambulatory Visit: Payer: BC Managed Care – PPO | Admitting: Hematology & Oncology

## 2014-09-14 ENCOUNTER — Other Ambulatory Visit (HOSPITAL_BASED_OUTPATIENT_CLINIC_OR_DEPARTMENT_OTHER): Payer: BC Managed Care – PPO | Admitting: Lab

## 2014-09-14 ENCOUNTER — Ambulatory Visit (HOSPITAL_BASED_OUTPATIENT_CLINIC_OR_DEPARTMENT_OTHER): Payer: BC Managed Care – PPO

## 2014-09-14 VITALS — BP 128/74 | HR 88 | Temp 97.2°F | Resp 18 | Ht 70.0 in | Wt 214.0 lb

## 2014-09-14 DIAGNOSIS — D693 Immune thrombocytopenic purpura: Secondary | ICD-10-CM

## 2014-09-14 LAB — CBC WITH DIFFERENTIAL (CANCER CENTER ONLY)
BASO#: 0.1 10*3/uL (ref 0.0–0.2)
BASO%: 1.3 % (ref 0.0–2.0)
EOS%: 3 % (ref 0.0–7.0)
Eosinophils Absolute: 0.2 10*3/uL (ref 0.0–0.5)
HEMATOCRIT: 31.1 % — AB (ref 38.7–49.9)
HEMOGLOBIN: 10.1 g/dL — AB (ref 13.0–17.1)
LYMPH#: 1 10*3/uL (ref 0.9–3.3)
LYMPH%: 15.9 % (ref 14.0–48.0)
MCH: 26.8 pg — ABNORMAL LOW (ref 28.0–33.4)
MCHC: 32.5 g/dL (ref 32.0–35.9)
MCV: 83 fL (ref 82–98)
MONO#: 0.9 10*3/uL (ref 0.1–0.9)
MONO%: 13.4 % — ABNORMAL HIGH (ref 0.0–13.0)
NEUT#: 4.2 10*3/uL (ref 1.5–6.5)
NEUT%: 66.4 % (ref 40.0–80.0)
Platelets: 83 10*3/uL — ABNORMAL LOW (ref 145–400)
RBC: 3.77 10*6/uL — ABNORMAL LOW (ref 4.20–5.70)
RDW: 14.7 % (ref 11.1–15.7)
WBC: 6.4 10*3/uL (ref 4.0–10.0)

## 2014-09-14 LAB — LACTATE DEHYDROGENASE: LDH: 439 U/L — ABNORMAL HIGH (ref 94–250)

## 2014-09-14 LAB — COMPREHENSIVE METABOLIC PANEL
ALT: 36 U/L (ref 0–53)
AST: 41 U/L — ABNORMAL HIGH (ref 0–37)
Albumin: 4.2 g/dL (ref 3.5–5.2)
Alkaline Phosphatase: 84 U/L (ref 39–117)
BUN: 15 mg/dL (ref 6–23)
CALCIUM: 9.1 mg/dL (ref 8.4–10.5)
CHLORIDE: 108 meq/L (ref 96–112)
CO2: 25 mEq/L (ref 19–32)
CREATININE: 1.09 mg/dL (ref 0.50–1.35)
GLUCOSE: 116 mg/dL — AB (ref 70–99)
POTASSIUM: 3.4 meq/L — AB (ref 3.5–5.3)
Sodium: 143 mEq/L (ref 135–145)
Total Bilirubin: 0.4 mg/dL (ref 0.2–1.2)
Total Protein: 6.4 g/dL (ref 6.0–8.3)

## 2014-09-14 MED ORDER — ROMIPLOSTIM INJECTION 500 MCG
900.0000 ug | Freq: Once | SUBCUTANEOUS | Status: AC
  Administered 2014-09-14: 900 ug via SUBCUTANEOUS
  Filled 2014-09-14: qty 1.8

## 2014-09-14 NOTE — Patient Instructions (Signed)
Romiplostim injection What is this medicine? ROMIPLOSTIM (roe mi PLOE stim) helps your body make more platelets. This medicine is used to treat low platelets caused by chronic idiopathic thrombocytopenic purpura (ITP). This medicine may be used for other purposes; ask your health care provider or pharmacist if you have questions. COMMON BRAND NAME(S): Nplate What should I tell my health care provider before I take this medicine? They need to know if you have any of these conditions: -cancer or myelodysplastic syndrome -low blood counts, like low white cell, platelet, or red cell counts -take medicines that treat or prevent blood clots -an unusual or allergic reaction to romiplostim, mannitol, other medicines, foods, dyes, or preservatives -pregnant or trying to get pregnant -breast-feeding How should I use this medicine? This medicine is for injection under the skin. It is given by a health care professional in a hospital or clinic setting. A special MedGuide will be given to you before your injection. Read this information carefully each time. Talk to your pediatrician regarding the use of this medicine in children. Special care may be needed. Overdosage: If you think you have taken too much of this medicine contact a poison control center or emergency room at once. NOTE: This medicine is only for you. Do not share this medicine with others. What if I miss a dose? It is important not to miss your dose. Call your doctor or health care professional if you are unable to keep an appointment. What may interact with this medicine? Interactions are not expected. This list may not describe all possible interactions. Give your health care provider a list of all the medicines, herbs, non-prescription drugs, or dietary supplements you use. Also tell them if you smoke, drink alcohol, or use illegal drugs. Some items may interact with your medicine. What should I watch for while using this  medicine? Your condition will be monitored carefully while you are receiving this medicine. Visit your prescriber or health care professional for regular checks on your progress and for the needed blood tests. It is important to keep all appointments. What side effects may I notice from receiving this medicine? Side effects that you should report to your doctor or health care professional as soon as possible: -allergic reactions like skin rash, itching or hives, swelling of the face, lips, or tongue -shortness of breath, chest pain, swelling in a leg -unusual bleeding or bruising Side effects that usually do not require medical attention (report to your doctor or health care professional if they continue or are bothersome): -dizziness -headache -muscle aches -pain in arms and legs -stomach pain -trouble sleeping This list may not describe all possible side effects. Call your doctor for medical advice about side effects. You may report side effects to FDA at 1-800-FDA-1088. Where should I keep my medicine? This drug is given in a hospital or clinic and will not be stored at home. NOTE: This sheet is a summary. It may not cover all possible information. If you have questions about this medicine, talk to your doctor, pharmacist, or health care provider.  2015, Elsevier/Gold Standard. (2008-07-11 15:13:04)  

## 2014-09-17 ENCOUNTER — Other Ambulatory Visit: Payer: Self-pay | Admitting: Internal Medicine

## 2014-09-19 ENCOUNTER — Other Ambulatory Visit: Payer: Self-pay | Admitting: Hematology & Oncology

## 2014-09-19 DIAGNOSIS — D693 Immune thrombocytopenic purpura: Secondary | ICD-10-CM

## 2014-09-20 ENCOUNTER — Ambulatory Visit (HOSPITAL_COMMUNITY)
Admission: RE | Admit: 2014-09-20 | Discharge: 2014-09-20 | Disposition: A | Discharge status: Home / Self Care | Payer: BC Managed Care – PPO | Source: Ambulatory Visit | Attending: Hematology & Oncology | Admitting: Hematology & Oncology

## 2014-09-20 ENCOUNTER — Encounter (HOSPITAL_COMMUNITY): Payer: Self-pay

## 2014-09-20 VITALS — BP 123/76 | HR 84 | Temp 98.5°F | Resp 19 | Ht 70.0 in | Wt 214.0 lb

## 2014-09-20 DIAGNOSIS — D693 Immune thrombocytopenic purpura: Secondary | ICD-10-CM | POA: Insufficient documentation

## 2014-09-20 LAB — CBC WITH DIFFERENTIAL/PLATELET
BASOS PCT: 1 % (ref 0–1)
Basophils Absolute: 0.1 10*3/uL (ref 0.0–0.1)
EOS PCT: 5 % (ref 0–5)
Eosinophils Absolute: 0.3 10*3/uL (ref 0.0–0.7)
HEMATOCRIT: 33.7 % — AB (ref 39.0–52.0)
HEMOGLOBIN: 11 g/dL — AB (ref 13.0–17.0)
Lymphocytes Relative: 13 % (ref 12–46)
Lymphs Abs: 0.8 10*3/uL (ref 0.7–4.0)
MCH: 26.3 pg (ref 26.0–34.0)
MCHC: 32.6 g/dL (ref 30.0–36.0)
MCV: 80.6 fL (ref 78.0–100.0)
Monocytes Absolute: 1 10*3/uL (ref 0.1–1.0)
Monocytes Relative: 15 % — ABNORMAL HIGH (ref 3–12)
NEUTROS PCT: 66 % (ref 43–77)
Neutro Abs: 4.3 10*3/uL (ref 1.7–7.7)
Platelets: 210 10*3/uL (ref 150–400)
RBC: 4.18 MIL/uL — AB (ref 4.22–5.81)
RDW: 14.6 % (ref 11.5–15.5)
WBC: 6.5 10*3/uL (ref 4.0–10.5)

## 2014-09-20 LAB — BONE MARROW EXAM

## 2014-09-20 MED ORDER — MEPERIDINE HCL 50 MG/ML IJ SOLN
50.0000 mg | Freq: Once | INTRAMUSCULAR | Status: AC
  Administered 2014-09-20: 25 mg via INTRAVENOUS
  Filled 2014-09-20: qty 1

## 2014-09-20 MED ORDER — SODIUM CHLORIDE 0.9 % IV SOLN
INTRAVENOUS | Status: DC
  Administered 2014-09-20: 500 mL via INTRAVENOUS

## 2014-09-20 MED ORDER — MIDAZOLAM HCL 10 MG/2ML IJ SOLN
10.0000 mg | Freq: Once | INTRAMUSCULAR | Status: AC
  Administered 2014-09-20: 2.5 mg via INTRAVENOUS
  Filled 2014-09-20: qty 2

## 2014-09-20 NOTE — Discharge Instructions (Signed)
Do not drive  For 24 hours Do not go into public places today May resume your regular diet and take home medications as usual May experience small amount of tingling in leg (biopsy side) May take shower and remove bandage in am For any questions or concerns, call dr If bleeding occurs at site, hold pressure x10 minutes  If continues, call doctorConscious Sedation, Adult, Care After Refer to this sheet in the next few weeks. These instructions provide you with information on caring for yourself after your procedure. Your health care provider may also give you more specific instructions. Your treatment has been planned according to current medical practices, but problems sometimes occur. Call your health care provider if you have any problems or questions after your procedure. WHAT TO EXPECT AFTER THE PROCEDURE  After your procedure:  You may feel sleepy, clumsy, and have poor balance for several hours.  Vomiting may occur if you eat too soon after the procedure. HOME CARE INSTRUCTIONS  Do not participate in any activities where you could become injured for at least 24 hours. Do not:  Drive.  Swim.  Ride a bicycle.  Operate heavy machinery.  Cook.  Use power tools.  Climb ladders.  Work from a high place.  Do not make important decisions or sign legal documents until you are improved.  If you vomit, drink water, juice, or soup when you can drink without vomiting. Make sure you have little or no nausea before eating solid foods.  Only take over-the-counter or prescription medicines for pain, discomfort, or fever as directed by your health care provider.  Make sure you and your family fully understand everything about the medicines given to you, including what side effects may occur.  You should not drink alcohol, take sleeping pills, or take medicines that cause drowsiness for at least 24 hours.  If you smoke, do not smoke without supervision.  If you are feeling better,  you may resume normal activities 24 hours after you were sedated.  Keep all appointments with your health care provider. SEEK MEDICAL CARE IF:  Your skin is pale or bluish in color.  You continue to feel nauseous or vomit.  Your pain is getting worse and is not helped by medicine.  You have bleeding or swelling.  You are still sleepy or feeling clumsy after 24 hours. SEEK IMMEDIATE MEDICAL CARE IF:  You develop a rash.  You have difficulty breathing.  You develop any type of allergic problem.  You have a fever. MAKE SURE YOU:  Understand these instructions.  Will watch your condition.  Will get help right away if you are not doing well or get worse. Document Released: 09/01/2013 Document Reviewed: 09/01/2013 [facility] Patient Information 2015 [place], [place]. This information is not intended to replace advice given to you by your health care provider. Make sure you discuss any questions you have with your health care provider. Bone Biopsy, Needle, Care After Read the instructions outlined below and refer to this sheet in the next few weeks. These discharge instructions provide you with general information on caring for yourself after you leave the hospital. Your caregiver may also give you specific instructions. While your treatment has been planned according to the most current medical practices available, unavoidable complications sometimes occur. If you have any problems or questions after discharge, call your caregiver. Finding out the results of your test Not all test results are available during your visit. If your test results are not back during the visit, make  an appointment with your caregiver to find out the results. Do not assume everything is normal if you have not heard from your caregiver or the medical facility. It is important for you to follow up on all of your test results.  SEEK MEDICAL CARE IF:   You have redness, swelling, or increasing pain at the site of  the biopsy.  You have pus coming from the biopsy site.  You have drainage from the biopsy site lasting longer than 1 day.  You notice a bad smell coming from the biopsy site or dressing.  You develop persistent nausea or vomiting. SEEK IMMEDIATE MEDICAL CARE IF:  You have a fever.  You develop a rash.  You have difficulty breathing.  You develop any reaction or side effects to medicines given. Document Released: 05/31/2005 Document Revised: 09/01/2013 Document Reviewed: 04/18/2009 [facility] Patient Information 2015 [place], [place]. This information is not intended to replace advice given to you by your health care provider. Make sure you discuss any questions you have with your health care provider.

## 2014-09-20 NOTE — Sedation Documentation (Signed)
Patient is resting comfortably. 

## 2014-09-20 NOTE — Sedation Documentation (Signed)
MD at bedside. 

## 2014-09-20 NOTE — Sedation Documentation (Signed)
dsg CDI

## 2014-09-20 NOTE — Procedures (Signed)
[name] was brought to the short stay unit for a bone marrow biopsy and aspirate. We did this because of refractory ITP.  He had IV placed in his left hand without difficulty.  His Mallampati score is 1. His ASA class is 1.  We did the appropriate timeout procedure at 7:55 AM.  He is placed onto his right side. He received a total of 2.5 mg of Versed and 25 mg of Demerol for IV sedation.  The left posterior iliac crest region was prepped and draped in sterile fashion. 5 mL of 1% lidocaine was treated under the skin down to the periosteum.  A scalpel was we was used to make an incision into the skin. We then obtained 2 bone marrow biopsies. We also obtained a bone marrow aspirate. All this was done without difficulty.  He tolerated the procedure without difficulties. We cleaned and dressed the procedure site sterilely.

## 2014-09-20 NOTE — Sedation Documentation (Signed)
Medication dose calculated and verified for: Scott Waters 25mg  demerol/2.5mg  versed

## 2014-09-20 NOTE — Sedation Documentation (Signed)
dsg applied per md/ CDI 

## 2014-09-20 NOTE — Sedation Documentation (Signed)
DSG CDI 

## 2014-09-20 NOTE — Sedation Documentation (Signed)
Patient denies pain and is resting comfortably.  

## 2014-09-21 ENCOUNTER — Ambulatory Visit: Payer: BC Managed Care – PPO

## 2014-09-21 ENCOUNTER — Other Ambulatory Visit (HOSPITAL_BASED_OUTPATIENT_CLINIC_OR_DEPARTMENT_OTHER): Payer: BC Managed Care – PPO | Admitting: Lab

## 2014-09-21 DIAGNOSIS — D693 Immune thrombocytopenic purpura: Secondary | ICD-10-CM

## 2014-09-21 LAB — CBC WITH DIFFERENTIAL (CANCER CENTER ONLY)
BASO#: 0.1 10*3/uL (ref 0.0–0.2)
BASO%: 0.7 % (ref 0.0–2.0)
EOS%: 1.6 % (ref 0.0–7.0)
Eosinophils Absolute: 0.1 10*3/uL (ref 0.0–0.5)
HEMATOCRIT: 31.6 % — AB (ref 38.7–49.9)
HGB: 10.2 g/dL — ABNORMAL LOW (ref 13.0–17.1)
LYMPH#: 1.2 10*3/uL (ref 0.9–3.3)
LYMPH%: 17.1 % (ref 14.0–48.0)
MCH: 27 pg — ABNORMAL LOW (ref 28.0–33.4)
MCHC: 32.3 g/dL (ref 32.0–35.9)
MCV: 84 fL (ref 82–98)
MONO#: 1 10*3/uL — AB (ref 0.1–0.9)
MONO%: 13.7 % — ABNORMAL HIGH (ref 0.0–13.0)
NEUT#: 4.7 10*3/uL (ref 1.5–6.5)
NEUT%: 66.9 % (ref 40.0–80.0)
PLATELETS: 287 10*3/uL (ref 145–400)
RBC: 3.78 10*6/uL — ABNORMAL LOW (ref 4.20–5.70)
RDW: 14.7 % (ref 11.1–15.7)
WBC: 7 10*3/uL (ref 4.0–10.0)

## 2014-09-21 NOTE — Telephone Encounter (Signed)
Sent to the pharmacy by e-scribe. 

## 2014-09-21 NOTE — Progress Notes (Signed)
Dr [name] informed that PLT count was 287 today. Dr [name] is very pleased with the platelet count and does not want pt to get NPlate today.

## 2014-09-21 NOTE — Telephone Encounter (Signed)
Refill x 6 months 

## 2014-09-26 ENCOUNTER — Telehealth: Payer: Self-pay | Admitting: *Deleted

## 2014-09-26 NOTE — Telephone Encounter (Signed)
-----   Message from [name], MD sent at 09/25/2014  9:12 AM EST ----- Call him and tell him that the bone marrow does not show any bad problem. He has a lot of platelets in the bone marrow which is what I want to see. Scott Waters

## 2014-09-27 LAB — CHROMOSOME ANALYSIS, BONE MARROW

## 2014-09-27 LAB — TISSUE HYBRIDIZATION (BONE MARROW)-NCBH

## 2014-09-28 ENCOUNTER — Ambulatory Visit (HOSPITAL_BASED_OUTPATIENT_CLINIC_OR_DEPARTMENT_OTHER): Payer: BC Managed Care – PPO | Admitting: Lab

## 2014-09-28 ENCOUNTER — Ambulatory Visit (HOSPITAL_BASED_OUTPATIENT_CLINIC_OR_DEPARTMENT_OTHER): Payer: BC Managed Care – PPO

## 2014-09-28 DIAGNOSIS — D693 Immune thrombocytopenic purpura: Secondary | ICD-10-CM

## 2014-09-28 DIAGNOSIS — D696 Thrombocytopenia, unspecified: Secondary | ICD-10-CM

## 2014-09-28 DIAGNOSIS — Z23 Encounter for immunization: Secondary | ICD-10-CM

## 2014-09-28 LAB — CBC WITH DIFFERENTIAL (CANCER CENTER ONLY)
BASO#: 0 10*3/uL (ref 0.0–0.2)
BASO%: 0.6 % (ref 0.0–2.0)
EOS%: 3.7 % (ref 0.0–7.0)
Eosinophils Absolute: 0.2 10*3/uL (ref 0.0–0.5)
HEMATOCRIT: 31 % — AB (ref 38.7–49.9)
HGB: 10.2 g/dL — ABNORMAL LOW (ref 13.0–17.1)
LYMPH#: 0.9 10*3/uL (ref 0.9–3.3)
LYMPH%: 18.4 % (ref 14.0–48.0)
MCH: 26.5 pg — ABNORMAL LOW (ref 28.0–33.4)
MCHC: 32.9 g/dL (ref 32.0–35.9)
MCV: 81 fL — ABNORMAL LOW (ref 82–98)
MONO#: 0.5 10*3/uL (ref 0.1–0.9)
MONO%: 11.3 % (ref 0.0–13.0)
NEUT#: 3.1 10*3/uL (ref 1.5–6.5)
NEUT%: 66 % (ref 40.0–80.0)
Platelets: <6 10*3/uL — CL (ref 145–400)
RBC: 3.85 10*6/uL — AB (ref 4.20–5.70)
RDW: 14.4 % (ref 11.1–15.7)
WBC: 4.6 10*3/uL (ref 4.0–10.0)

## 2014-09-28 MED ORDER — ROMIPLOSTIM INJECTION 500 MCG
900.0000 ug | Freq: Once | SUBCUTANEOUS | Status: AC
  Administered 2014-09-28: 900 ug via SUBCUTANEOUS
  Filled 2014-09-28: qty 1.8

## 2014-09-28 MED ORDER — PNEUMOCOCCAL VAC POLYVALENT 25 MCG/0.5ML IJ INJ
0.5000 mL | INJECTION | INTRAMUSCULAR | Status: AC
  Administered 2014-09-28: 0.5 mL via INTRAMUSCULAR
  Filled 2014-09-28: qty 0.5

## 2014-09-28 MED ORDER — MENINGOCOCCAL VAC A,C,Y,W-135 ~~LOC~~ INJ
0.5000 mL | INJECTION | Freq: Once | SUBCUTANEOUS | Status: AC
  Administered 2014-09-28: 0.5 mL via SUBCUTANEOUS
  Filled 2014-09-28: qty 0.5

## 2014-09-28 NOTE — Progress Notes (Signed)
Hematology and Oncology Follow Up Visit  Pearlie Lafosse [id number] 1956/04/22 58 y.o. 09/28/2014   Principle Diagnosis:   Refractory immune thrombocytopenia  Current Therapy:    Nplate-every 1-2 week dose     Interim History:  Mr.  Mccombie is in the treatment area for his Nplate shot. He has not had this for 2 weeks. His platelet count last week was 287. Today, his platelet count is less than 6. He has had no bleeding. He has felt okay.  I did do a bone marrow test on him 2 weeks ago. The bone marrow report ([id number]) showed a hypercellular marrow. He had numerous megakaryocytes. His cytogenetics were normal.  I think that we will have to consider him for a splenectomy now. I'm not sure how well this will work but I just do not want to keep given him injections weekly if we can avoid it.  Medications: Current outpatient prescriptions: amLODipine-olmesartan (AZOR) 10-40 MG per tablet, Take 1 tablet by mouth daily., Disp: , Rfl: ;  fluticasone (FLONASE) 50 MCG/ACT nasal spray, Place 2 sprays into both nostrils daily as needed for allergies., Disp: , Rfl: ;  folic acid (FOLVITE) 1 MG tablet, Take 1 tablet (1 mg total) by mouth daily., Disp: 30 tablet, Rfl: 6 Multiple Vitamin (MULTIVITAMIN WITH MINERALS) TABS tablet, Take 1 tablet by mouth daily., Disp: , Rfl: ;  pantoprazole (PROTONIX) 40 MG tablet, Take 1 tablet (40 mg total) by mouth daily., Disp: 30 tablet, Rfl: 6;  potassium chloride (K-DUR,KLOR-CON) 10 MEQ tablet, take 3 tablets by mouth once daily, Disp: , Rfl: ;  potassium chloride (K-DUR,KLOR-CON) 10 MEQ tablet, take 2 tablets by mouth once daily, Disp: 60 tablet, Rfl: 5 simvastatin (ZOCOR) 40 MG tablet, Take 40 mg by mouth daily., Disp: , Rfl: ;  VITAMIN K PO, Take 100 mcg by mouth every morning. OTC med, Disp: , Rfl:   Allergies: No Known Allergies  Past Medical History, Surgical history, Social history, and Family History were reviewed and updated.  Review of Systems: As  above  Physical Exam:  vitals were not taken for this visit.  He has no ocular or oral lesions. There is no lymphadenopathy. There is no palpable liver or spleen. His lungs are clear. Cardiac exam regular rate and rhythm. Neurological exam is nonfocal. Head and neck exam shows no adenopathy. There is no petechia within the oral cavity. He has good strength in his extremities. he has no joint swelling.  Lab Results  Component Value Date   WBC 4.6 09/28/2014   HGB 10.2* 09/28/2014   HCT 31.0* 09/28/2014   MCV 81* 09/28/2014   PLT <6* 09/28/2014     Chemistry      Component Value Date/Time   NA 143 09/14/2014 1358   NA 139 07/20/2014 0902   K 3.4* 09/14/2014 1358   K 3.3 07/20/2014 0902   CL 108 09/14/2014 1358   CL 103 07/20/2014 0902   CO2 25 09/14/2014 1358   CO2 27 07/20/2014 0902   BUN 15 09/14/2014 1358   BUN 11 07/20/2014 0902   CREATININE 1.09 09/14/2014 1358   CREATININE 0.9 07/20/2014 0902      Component Value Date/Time   CALCIUM 9.1 09/14/2014 1358   CALCIUM 8.6 07/20/2014 0902   ALKPHOS 84 09/14/2014 1358   ALKPHOS 75 07/20/2014 0902   AST 41* 09/14/2014 1358   AST 51* 07/20/2014 0902   ALT 36 09/14/2014 1358   ALT 36 07/20/2014 0902   BILITOT 0.4 09/14/2014 1358  BILITOT 0.80 07/20/2014 0902         Impression and Plan: Mr. Alves is 58 year old gentleman with what I would consider to be refractory immune thrombocytopenia. We have tried him on steroids. We have tried IVIG. We have tried Rituxan. He has had only transient responses to these.  I think that a splenectomy is worthwhile. I hate to put him through surgery but I think in the long run we might be able to get his platelet count up modestly. I think that if we get his reticulocyte count above 50,000, that think we will be doing okay and he will have very little risk of bleeding.  I talked him about this. He understands. He agrees to this.  He'll will need his triple vaccine. We will give him  the pneumococcal and meningococcal vaccines today. He will get his Haemophilus vaccine next week.  I spoke with Dr. Dalbert Batman of [facility] surgery. He will see Mr. Toney Rakes.   [name], MD 11/4/20156:02 PM

## 2014-09-28 NOTE — Patient Instructions (Addendum)
Pneumococcal Conjugate Vaccine: What You Need to Know Your doctor recommends that you, or your child, get a dose of PCV13 today. 1. Why get vaccinated? Pneumococcal conjugate vaccine (called PCV13 or Prevnar 13) is recommended to protect infants and toddlers, and some older children and adults with certain health conditions, from pneumococcal disease. Pneumococcal disease is caused by infection with Streptococcus pneumoniae bacteria. These bacteria can spread from person to person through close contact. Pneumococcal disease can lead to severe health problems, including pneumonia, blood infections, and meningitis. Meningitis is an infection of the covering of the brain. Pneumococcal meningitis is fairly rare (less than 1 case per 100,000 people each year), but it leads to other health problems, including deafness and brain damage. In children, it is fatal in about 1 case out of 10. Children younger than two are at higher risk for serious disease than older children. People with certain medical conditions, people over age 66, and cigarette smokers are also at higher risk. Before vaccine, pneumococcal infections caused many problems each year in the [place] in children younger than 5, including:  more than 700 cases of meningitis,  13,000 blood infections,  about 5 million ear infections, and  about 200 deaths. About 4,000 adults still die each year because of pneumococcal infections. Pneumococcal infections can be hard to treat because some strains are resistant to antibiotics. This makes prevention through vaccination even more important. 2. PCV13 vaccine There are more than 90 types of pneumococcal bacteria. PCV13 protects against 13 of them. These 13 strains cause most severe infections in children and about half of infections in adults.  PCV13 is routinely given to children at 2, 4, 6, and 2-73 months of age. Children in this age range are at greatest risk for serious diseases caused  by pneumococcal infection. PCV13 vaccine may also be recommended for some older children or adults. Your doctor can give you details. A second type of pneumococcal vaccine, called PPSV23, may also be given to some children and adults, including anyone over age 9. There is a separate Vaccine Information Statement for this vaccine. 3. Precautions  Anyone who has ever had a life-threatening allergic reaction to a dose of this vaccine, to an earlier pneumococcal vaccine called PCV7 (or Prevnar), or to any vaccine containing diphtheria toxoid (for example, DTaP), should not get PCV13. Anyone with a severe allergy to any component of PCV13 should not get the vaccine. Tell your doctor if the person being vaccinated has any severe allergies. If the person scheduled for vaccination is sick, your doctor might decide to reschedule the shot on another day. Your doctor can give you more information about any of these precautions. 4. What are the risks of PCV13 vaccine?  With any medicine, including vaccines, there is a chance of side effects. These are usually mild and go away on their own, but serious reactions are also possible. Reported problems associated with PCV13 vary by dose and age, but generally:  About half of children became drowsy after the shot, had a temporary loss of appetite, or had redness or tenderness where the shot was given.  About 1 out of 3 had swelling where the shot was given.  About 1 out of 3 had a mild fever, and about 1 in 20 had a higher fever (over 102.18F).  Up to about 8 out of 10 became fussy or irritable. Adults receiving the vaccine have reported redness, pain, and swelling where the shot was given. Mild fever, fatigue, headache, chills, or  muscle pain have also been reported. Life-threatening allergic reactions from any vaccine are very rare. 5. What if there is a serious reaction? What should I look for?  Look for anything that concerns you, such as signs of a  severe allergic reaction, very high fever, or behavior changes. Signs of a severe allergic reaction can include hives, swelling of the face and throat, difficulty breathing, a fast heartbeat, dizziness, and weakness. These would start a few minutes to a few hours after the vaccination. What should I do?  If you think it is a severe allergic reaction or other emergency that can't wait, call 9-1-1 or get the person to the nearest hospital. Otherwise, call your doctor.  Afterward, the reaction should be reported to the Vaccine Adverse Event Reporting System (VAERS). Your doctor might file this report, or you can do it yourself through the VAERS web site at www.vaers.SamedayNews.es, or by calling (575)357-9211. VAERS is only for reporting reactions. They do not give medical advice. 6. The National Vaccine Injury Compensation Program The [employer] Vaccine Injury Compensation Program (VICP) is a federal program that was created to compensate people who may have been injured by certain vaccines. Persons who believe they may have been injured by a vaccine can learn about the program and about filing a claim by calling 765-010-5787 or visiting the [place] website at GoldCloset.com.ee. 7. How can I learn more?  Ask your doctor.  Call your local or state health department.  Contact the Centers for Disease Control and Prevention (CDC):  Call 469 691 0825 (1-800-CDC-INFO) or  Visit CDC's website at http://hunter.com/ CDC PCV13 Vaccine VIS (Interim) (01/22/12) Document Released: 09/08/2006 Document Revised: 03/28/2014 Document Reviewed: 12/31/2013 [facility] Patient Information 2015 [place]. This information is not intended to replace advice given to you by your health care provider. Make sure you discuss any questions you have with your health care provider. Meningococcal Vaccines: What You Need to Know 1. What is meningococcal disease? Meningococcal disease is a serious bacterial  illness. It is a leading cause of bacterial meningitis in children 2 through 16 years old in the [place]. Meningitis is an infection of the covering of the brain and the spinal cord. Meningococcal disease also causes blood infections. About 1,000-1,200 people get meningococcal disease each year in the [place] Even when they are treated with antibiotics, 10-15% of these people die. Of those who live, another 11%-19% lose their arms or legs, have problems with their nervous systems, become deaf, or suffer seizures or strokes. Anyone can get meningococcal disease. But it is most common in infants less than one year of age and people 16-21 years. Children with certain medical conditions, such as lack of a spleen, have an increased risk of getting meningococcal disease. College freshmen living in [place] are also at increased risk. Meningococcal infections can be treated with drugs such as penicillin. Still, many people who get the disease die from it, and many others are affected for life. This is why preventing the disease through use of meningococcal vaccine is important for people at highest risk. 2. Meningococcal vaccine There are two kinds of meningococcal vaccine in the [place]  Meningococcal conjugate vaccine (MCV4) is the preferred vaccine for people 67 years of age and younger.  Meningococcal polysaccharide vaccine (MPSV4) has been available since the 1970s. It is the only meningococcal vaccine licensed for people older than 45. Both vaccines can prevent 4 types of meningococcal disease, including 2 of the 3 types most common in the [place] and a  type that causes epidemics in [place]. There are other types of meningococcal disease; the vaccines do not protect against these.  3. Who should get meningococcal vaccine and when? Routine vaccination Two doses of MCV4 are recommended for adolescents 11 through 60 years of age: the first dose at 71 or 60 years of age, with a booster dose at age  73. Adolescents in this age group with HIV infection should get 3 doses: 2 doses 2 months apart at 55 or 12 years, plus a booster at age 21. If the first dose (or series) is given between 50 and 30 years of age, the booster should be given between 36 and 50. If the first dose (or series) is given after the 16th birthday, a booster is not needed. Other people at increased risk  College freshmen living in dormitories.  Laboratory personnel who are routinely exposed to meningococcal bacteria.  [employer] recruits.  Anyone traveling to, or living in, a part of the world where meningococcal disease is common, such as parts of [place].  Anyone who has a damaged spleen, or whose spleen has been removed.  Anyone who has persistent complement component deficiency (an immune system disorder).  People who might have been exposed to meningitis during an outbreak. Children between 61 and 79 months of age, and anyone else with certain medical conditions need 2 doses for adequate protection. Ask your doctor about the number and timing of doses, and the need for booster doses. MCV4 is the preferred vaccine for people in these groups who are 9 months through 60 years of age. MPSV4 can be used for adults older than 55. 4. Some people should not get meningococcal vaccine or should wait.  Anyone who has ever had a severe (life-threatening) allergic reaction to a previous dose of MCV4 or MPSV4 vaccine should not get another dose of either vaccine.  Anyone who has a severe (life threatening) allergy to any vaccine component should not get the vaccine. Tell your doctor if you have any severe allergies.  Anyone who is moderately or severely ill at the time the shot is scheduled should probably wait until they recover. Ask your doctor. People with a mild illness can usually get the vaccine.  Meningococcal vaccines may be given to pregnant women. MCV4 is a fairly new vaccine and has not been studied in pregnant  women as much as MPSV4 has. It should be used only if clearly needed. The manufacturers of MCV4 maintain pregnancy registries for women who are vaccinated while pregnant. Except for children with sickle cell disease or without a working spleen, meningococcal vaccines may be given at the same time as other vaccines. 5. What are the risks from meningococcal vaccines? A vaccine, like any medicine, could possibly cause serious problems, such as severe allergic reactions. The risk of meningococcal vaccine causing serious harm, or death, is extremely small. Brief fainting spells and related symptoms (such as jerking or seizure-like movements) can follow a vaccination. They happen most often with adolescents, and they can result in falls and injuries. Sitting or lying down for about 15 minutes after getting the shot--especially if you feel faint--can help prevent these injuries. Mild problems As many as half the people who get meningococcal vaccines have mild side effects, such as redness or pain where the shot was given. If these problems occur, they usually last for 1 or 2 days. They are more common after MCV4 than after MPSV4. A small percentage of people who receive the vaccine develop a  mild fever. Severe problems Serious allergic reactions, within a few minutes to a few hours of the shot, are very rare. 6. What if there is a serious reaction? What should I look for? Look for anything that concerns you, such as signs of a severe allergic reaction, very high fever, or behavior changes. Signs of a severe allergic reaction can include hives, swelling of the face and throat, difficulty breathing, a fast heartbeat, dizziness, and weakness. These would start a few minutes to a few hours after the vaccination. What should I do?  If you think it is a severe allergic reaction or other emergency that can't wait, call 9-1-1 or get the person to the nearest hospital. Otherwise, call your doctor.  Afterward,  the reaction should be reported to the Vaccine Adverse Event Reporting System (VAERS). Your doctor might file this report, or you can do it yourself through the VAERS web site at www.vaers.SamedayNews.es, or by calling (737)198-3872. VAERS is only for reporting reactions. They do not give medical advice. 7. The National Vaccine Injury Compensation Program The [employer] Vaccine Injury Compensation Program (VICP) is a federal program that was created to compensate people who may have been injured by certain vaccines. Persons who believe they may have been injured by a vaccine can learn about the program and about filing a claim by calling (346)540-5653 or visiting the [place] website at GoldCloset.com.ee. 8. How can I learn more?  Ask your doctor.  Call your local or state health department.  Contact the Centers for Disease Control and Prevention (CDC):  Call 225 358 4308 (1-800-CDC-INFO) or  Visit the CDC's website at http://hunter.com/ CDC Meningococcal Vaccine (Interim) VIS (09/07/2010) Document Released: 09/08/2006 Document Revised: 03/28/2014 Document Reviewed: 03/03/2013 [facility] Patient Information 2015 [place]. This information is not intended to replace advice given to you by your health care provider. Make sure you discuss any questions you have with your health care provider. Romiplostim injection What is this medicine? ROMIPLOSTIM (roe mi PLOE stim) helps your body make more platelets. This medicine is used to treat low platelets caused by chronic idiopathic thrombocytopenic purpura (ITP). This medicine may be used for other purposes; ask your health care provider or pharmacist if you have questions. COMMON BRAND NAME(S): Nplate What should I tell my health care provider before I take this medicine? They need to know if you have any of these conditions: -cancer or myelodysplastic syndrome -low blood counts, like low white cell, platelet, or red cell  counts -take medicines that treat or prevent blood clots -an unusual or allergic reaction to romiplostim, mannitol, other medicines, foods, dyes, or preservatives -pregnant or trying to get pregnant -breast-feeding How should I use this medicine? This medicine is for injection under the skin. It is given by a health care professional in a hospital or clinic setting. A special MedGuide will be given to you before your injection. Read this information carefully each time. Talk to your pediatrician regarding the use of this medicine in children. Special care may be needed. Overdosage: If you think you have taken too much of this medicine contact a poison control center or emergency room at once. NOTE: This medicine is only for you. Do not share this medicine with others. What if I miss a dose? It is important not to miss your dose. Call your doctor or health care professional if you are unable to keep an appointment. What may interact with this medicine? Interactions are not expected. This list may not describe all possible interactions. Give your  health care provider a list of all the medicines, herbs, non-prescription drugs, or dietary supplements you use. Also tell them if you smoke, drink alcohol, or use illegal drugs. Some items may interact with your medicine. What should I watch for while using this medicine? Your condition will be monitored carefully while you are receiving this medicine. Visit your prescriber or health care professional for regular checks on your progress and for the needed blood tests. It is important to keep all appointments. What side effects may I notice from receiving this medicine? Side effects that you should report to your doctor or health care professional as soon as possible: -allergic reactions like skin rash, itching or hives, swelling of the face, lips, or tongue -shortness of breath, chest pain, swelling in a leg -unusual bleeding or bruising Side effects  that usually do not require medical attention (report to your doctor or health care professional if they continue or are bothersome): -dizziness -headache -muscle aches -pain in arms and legs -stomach pain -trouble sleeping This list may not describe all possible side effects. Call your doctor for medical advice about side effects. You may report side effects to FDA at 1-800-FDA-1088. Where should I keep my medicine? This drug is given in a hospital or clinic and will not be stored at home. NOTE: This sheet is a summary. It may not cover all possible information. If you have questions about this medicine, talk to your doctor, pharmacist, or health care provider.  2015, Elsevier/Gold Standard. (2008-07-11 15:13:04)

## 2014-09-29 ENCOUNTER — Telehealth (INDEPENDENT_AMBULATORY_CARE_PROVIDER_SITE_OTHER): Payer: Self-pay

## 2014-09-29 ENCOUNTER — Encounter: Payer: Self-pay | Admitting: Hematology & Oncology

## 2014-09-29 NOTE — Telephone Encounter (Signed)
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

## 2014-10-05 ENCOUNTER — Other Ambulatory Visit (HOSPITAL_BASED_OUTPATIENT_CLINIC_OR_DEPARTMENT_OTHER): Payer: BC Managed Care – PPO | Admitting: Lab

## 2014-10-05 ENCOUNTER — Ambulatory Visit (HOSPITAL_BASED_OUTPATIENT_CLINIC_OR_DEPARTMENT_OTHER): Payer: BC Managed Care – PPO

## 2014-10-05 ENCOUNTER — Encounter (HOSPITAL_COMMUNITY): Payer: Self-pay

## 2014-10-05 VITALS — BP 128/69 | HR 72 | Temp 98.0°F | Resp 18

## 2014-10-05 DIAGNOSIS — D693 Immune thrombocytopenic purpura: Secondary | ICD-10-CM

## 2014-10-05 DIAGNOSIS — Z23 Encounter for immunization: Secondary | ICD-10-CM

## 2014-10-05 DIAGNOSIS — D72819 Decreased white blood cell count, unspecified: Secondary | ICD-10-CM

## 2014-10-05 LAB — TECHNOLOGIST REVIEW CHCC SATELLITE

## 2014-10-05 LAB — CBC WITH DIFFERENTIAL (CANCER CENTER ONLY)
BASO#: 0.1 10*3/uL (ref 0.0–0.2)
BASO%: 1.5 % (ref 0.0–2.0)
EOS%: 3.6 % (ref 0.0–7.0)
Eosinophils Absolute: 0.2 10*3/uL (ref 0.0–0.5)
HEMATOCRIT: 31.5 % — AB (ref 38.7–49.9)
HEMOGLOBIN: 10.2 g/dL — AB (ref 13.0–17.1)
LYMPH#: 1 10*3/uL (ref 0.9–3.3)
LYMPH%: 20.5 % (ref 14.0–48.0)
MCH: 26.7 pg — AB (ref 28.0–33.4)
MCHC: 32.4 g/dL (ref 32.0–35.9)
MCV: 83 fL (ref 82–98)
MONO#: 0.6 10*3/uL (ref 0.1–0.9)
MONO%: 13.3 % — AB (ref 0.0–13.0)
NEUT#: 2.9 10*3/uL (ref 1.5–6.5)
NEUT%: 61.1 % (ref 40.0–80.0)
Platelets: 102 10*3/uL — ABNORMAL LOW (ref 145–400)
RBC: 3.82 10*6/uL — ABNORMAL LOW (ref 4.20–5.70)
RDW: 15.4 % (ref 11.1–15.7)
WBC: 4.7 10*3/uL (ref 4.0–10.0)

## 2014-10-05 MED ORDER — ROMIPLOSTIM INJECTION 500 MCG
900.0000 ug | Freq: Once | SUBCUTANEOUS | Status: AC
  Administered 2014-10-05: 900 ug via SUBCUTANEOUS
  Filled 2014-10-05: qty 1.8

## 2014-10-05 MED ORDER — HAEMOPHILUS B POLYSAC CONJ VAC IM SOLR
0.5000 mL | Freq: Once | INTRAMUSCULAR | Status: AC
  Administered 2014-10-05: 0.5 mL via INTRAMUSCULAR
  Filled 2014-10-05: qty 0.5

## 2014-10-05 NOTE — Patient Instructions (Signed)
Haemophilus influenzae type b Conjugate Vaccine injection What is this medicine? HAEMOPHILUS INFLUENZAE TYPE B CONJUGATE VACCINE (hem OFF fil [place] in floo En zuh type B KAN ji get VAK seen) is used to prevent infections of a Haemophilus bacteria. This medicine may be used for other purposes; ask your health care provider or pharmacist if you have questions. COMMON BRAND NAME(S): ActHIB, Hiberix, HibTITER, PedvaxHIB What should I tell my health care provider before I take this medicine? They need to know if you have any of these conditions: -bleeding disorder -Guillain-Barre syndrome -immune system problems -infection with fever -low levels of platelets in the blood -take medicines that treat or prevent blood clots -an unusual or allergic reaction to vaccines, other medicines, foods, dyes, or preservatives -pregnant or trying to get pregnant -breast-feeding How should I use this medicine? This vaccine is for injection into a muscle. It is given by a health care professional. A copy of Vaccine Information Statements will be given before each vaccination. Read this sheet carefully each time. The sheet may change frequently. Talk to your pediatrician regarding the use of this medicine in children. While this drug may be prescribed for children as young as 69 months old for selected conditions, precautions do apply. Overdosage: If you think you have taken too much of this medicine contact a poison control center or emergency room at once. NOTE: This medicine is only for you. Do not share this medicine with others. What if I miss a dose? Keep appointments for follow-up (booster) doses as directed. It is important not to miss your dose. Call your doctor or health care professional if you are unable to keep an appointment. What may interact with this medicine? -adalimumab -anakinra -infliximab -medicines that suppress your immune system -medicines that treat or prevent blood clots like warfarin,  enoxaparin, and dalteparin -medicines to treat cancer This list may not describe all possible interactions. Give your health care provider a list of all the medicines, herbs, non-prescription drugs, or dietary supplements you use. Also tell them if you smoke, drink alcohol, or use illegal drugs. Some items may interact with your medicine. What should I watch for while using this medicine? Visit your doctor for regular check-ups as directed. This vaccine, like all vaccines, may not fully protect everyone. What side effects may I notice from receiving this medicine? Side effects that you should report to your doctor or health care professional as soon as possible: -allergic reactions like skin rash, itching or hives, swelling of the face, lips, or tongue -breathing problems -extreme changes in behavior -fever over 100 degrees F -pain, tingling, numbness in the hands or feet -seizures -unusually weak or tired Side effects that usually do not require medical attention (report to your doctor or health care professional if they continue or are bothersome): -aches or pains -bruising, pain, swelling at site where injected -diarrhea -headache -loss of appetite -low-grade fever of 100 degrees F or less -nausea, vomiting -sleepy This list may not describe all possible side effects. Call your doctor for medical advice about side effects. You may report side effects to FDA at 1-800-FDA-1088. Where should I keep my medicine? This drug is given in a hospital or clinic and will not be stored at home. NOTE: This sheet is a summary. It may not cover all possible information. If you have questions about this medicine, talk to your doctor, pharmacist, or health care provider.  2015, Elsevier/Gold Standard. (2014-03-14 13:43:01) Romiplostim injection What is this medicine? ROMIPLOSTIM (roe mi PLOE  stim) helps your body make more platelets. This medicine is used to treat low platelets caused by chronic  idiopathic thrombocytopenic purpura (ITP). This medicine may be used for other purposes; ask your health care provider or pharmacist if you have questions. COMMON BRAND NAME(S): Nplate What should I tell my health care provider before I take this medicine? They need to know if you have any of these conditions: -cancer or myelodysplastic syndrome -low blood counts, like low white cell, platelet, or red cell counts -take medicines that treat or prevent blood clots -an unusual or allergic reaction to romiplostim, mannitol, other medicines, foods, dyes, or preservatives -pregnant or trying to get pregnant -breast-feeding How should I use this medicine? This medicine is for injection under the skin. It is given by a health care professional in a hospital or clinic setting. A special MedGuide will be given to you before your injection. Read this information carefully each time. Talk to your pediatrician regarding the use of this medicine in children. Special care may be needed. Overdosage: If you think you have taken too much of this medicine contact a poison control center or emergency room at once. NOTE: This medicine is only for you. Do not share this medicine with others. What if I miss a dose? It is important not to miss your dose. Call your doctor or health care professional if you are unable to keep an appointment. What may interact with this medicine? Interactions are not expected. This list may not describe all possible interactions. Give your health care provider a list of all the medicines, herbs, non-prescription drugs, or dietary supplements you use. Also tell them if you smoke, drink alcohol, or use illegal drugs. Some items may interact with your medicine. What should I watch for while using this medicine? Your condition will be monitored carefully while you are receiving this medicine. Visit your prescriber or health care professional for regular checks on your progress and for the  needed blood tests. It is important to keep all appointments. What side effects may I notice from receiving this medicine? Side effects that you should report to your doctor or health care professional as soon as possible: -allergic reactions like skin rash, itching or hives, swelling of the face, lips, or tongue -shortness of breath, chest pain, swelling in a leg -unusual bleeding or bruising Side effects that usually do not require medical attention (report to your doctor or health care professional if they continue or are bothersome): -dizziness -headache -muscle aches -pain in arms and legs -stomach pain -trouble sleeping This list may not describe all possible side effects. Call your doctor for medical advice about side effects. You may report side effects to FDA at 1-800-FDA-1088. Where should I keep my medicine? This drug is given in a hospital or clinic and will not be stored at home. NOTE: This sheet is a summary. It may not cover all possible information. If you have questions about this medicine, talk to your doctor, pharmacist, or health care provider.  2015, Elsevier/Gold Standard. (2008-07-11 15:13:04)

## 2014-10-10 ENCOUNTER — Other Ambulatory Visit (INDEPENDENT_AMBULATORY_CARE_PROVIDER_SITE_OTHER): Payer: Self-pay | Admitting: General Surgery

## 2014-10-10 MED ORDER — SODIUM CHLORIDE 0.9 % IV SOLN: Freq: Once | INTRAVENOUS | Status: DC

## 2014-10-12 ENCOUNTER — Ambulatory Visit: Payer: BC Managed Care – PPO

## 2014-10-12 ENCOUNTER — Other Ambulatory Visit (HOSPITAL_BASED_OUTPATIENT_CLINIC_OR_DEPARTMENT_OTHER): Payer: BC Managed Care – PPO | Admitting: Lab

## 2014-10-12 ENCOUNTER — Telehealth: Payer: Self-pay | Admitting: Hematology & Oncology

## 2014-10-12 ENCOUNTER — Other Ambulatory Visit (INDEPENDENT_AMBULATORY_CARE_PROVIDER_SITE_OTHER): Payer: Self-pay | Admitting: General Surgery

## 2014-10-12 ENCOUNTER — Encounter: Payer: Self-pay | Admitting: *Deleted

## 2014-10-12 ENCOUNTER — Encounter: Payer: Self-pay | Admitting: Hematology & Oncology

## 2014-10-12 ENCOUNTER — Ambulatory Visit (HOSPITAL_BASED_OUTPATIENT_CLINIC_OR_DEPARTMENT_OTHER): Payer: BC Managed Care – PPO | Admitting: Hematology & Oncology

## 2014-10-12 ENCOUNTER — Telehealth (INDEPENDENT_AMBULATORY_CARE_PROVIDER_SITE_OTHER): Payer: Self-pay

## 2014-10-12 VITALS — BP 141/71 | HR 86 | Temp 98.4°F | Resp 18 | Ht 70.0 in | Wt 214.0 lb

## 2014-10-12 DIAGNOSIS — D693 Immune thrombocytopenic purpura: Secondary | ICD-10-CM

## 2014-10-12 LAB — CBC WITH DIFFERENTIAL (CANCER CENTER ONLY)
BASO#: 0.1 10*3/uL (ref 0.0–0.2)
BASO%: 1.3 % (ref 0.0–2.0)
EOS ABS: 0.2 10*3/uL (ref 0.0–0.5)
EOS%: 4.4 % (ref 0.0–7.0)
HCT: 32.7 % — ABNORMAL LOW (ref 38.7–49.9)
HGB: 10.5 g/dL — ABNORMAL LOW (ref 13.0–17.1)
LYMPH#: 1.1 10*3/uL (ref 0.9–3.3)
LYMPH%: 20.6 % (ref 14.0–48.0)
MCH: 26.5 pg — ABNORMAL LOW (ref 28.0–33.4)
MCHC: 32.1 g/dL (ref 32.0–35.9)
MCV: 83 fL (ref 82–98)
MONO#: 0.7 10*3/uL (ref 0.1–0.9)
MONO%: 12.6 % (ref 0.0–13.0)
NEUT#: 3.2 10*3/uL (ref 1.5–6.5)
NEUT%: 61.1 % (ref 40.0–80.0)
PLATELETS: 186 10*3/uL (ref 145–400)
RBC: 3.96 10*6/uL — AB (ref 4.20–5.70)
RDW: 15.2 % (ref 11.1–15.7)
WBC: 5.3 10*3/uL (ref 4.0–10.0)

## 2014-10-12 LAB — TECHNOLOGIST REVIEW CHCC SATELLITE

## 2014-10-12 MED ORDER — SODIUM CHLORIDE 0.9 % IV SOLN: Freq: Once | INTRAVENOUS | Status: DC

## 2014-10-12 NOTE — Progress Notes (Signed)
No injection today per dr. ennever 

## 2014-10-12 NOTE — Progress Notes (Signed)
Received notice from Dr Darrel Hoover office that pt's surgery is scheduled for 11/08/14. Dr [name] notified

## 2014-10-12 NOTE — Telephone Encounter (Signed)
Pt aware of 11-25 will get schedule when he comes in.

## 2014-10-12 NOTE — Telephone Encounter (Signed)
-----   Message from Overton Mam sent at 10/12/2014 12:42 PM EST ----- Regarding: Patient scheduled I have this patient scheduled for surgery 12/15 with Dr. Barry Dienes to assist.    Dr. Dalbert Batman I had a call from the hospital about orders you had put in for platelets for this patient.  Since you had marked it either [facility] or [facility] they were letting me know they were cancelling that order. They advised that once we had the date and location you could put back in the orders.  Is there anything more I need to do?  Thanks  ----- Message -----    From: Illene Regulus, MA    Sent: 10/10/2014   4:39 PM      To: Dois Davenport, LPN, Overton Mam, #  When pt is scheduled for surgery please notify Delilah Shan and Jenny Reichmann of sx date b/c one of them will notify Dr Martha Clan the sx date.  Dr [name] will need to do pre treatment to raise pt's platelets count preop per Dr Dalbert Batman.  [facility] orders turned into coding today.

## 2014-10-12 NOTE — Progress Notes (Signed)
Hematology and Oncology Follow Up Visit  Scott Waters [id number] [birth year] 58 y.o. 10/12/2014   Principle Diagnosis:   Refractory immune thrombocytopenia  Current Therapy:    Nplate-every 1-2 week dose to keep platelet count above 150K     Interim History:  Mr.  Waters is for follow-up. He did see Dr. Dalbert Batman. That her interim will set of his splenectomy for 2 or 3 weeks. I called Dr. Dalbert Batman today about this.  Scott Waters has been doing okay. He's working. He's having no problems with bleeding or bruising. He's having no fatigue. The been no abdominal pain. He's having no change in bowel or bladder habits..  I did do a bone marrow test on him 3 weeks ago. The bone marrow report ([id number]) showed a hypercellular marrow. He had numerous megakaryocytes. His cytogenetics were normal.  He has had no fever. He has had no leg swelling. He has had no headache.  Medications: Current outpatient prescriptions: amLODipine-olmesartan (AZOR) 10-40 MG per tablet, Take 1 tablet by mouth daily., Disp: , Rfl: ;  fluticasone (FLONASE) 50 MCG/ACT nasal spray, Place 2 sprays into both nostrils daily as needed for allergies., Disp: , Rfl: ;  folic acid (FOLVITE) 1 MG tablet, Take 1 tablet (1 mg total) by mouth daily., Disp: 30 tablet, Rfl: 6 Multiple Vitamin (MULTIVITAMIN WITH MINERALS) TABS tablet, Take 1 tablet by mouth daily., Disp: , Rfl: ;  NON FORMULARY, Take by mouth every morning. GINSING, Disp: , Rfl: ;  Omega-3 Fatty Acids (FISH OIL) 1000 MG CPDR, Take by mouth every morning., Disp: , Rfl: ;  pantoprazole (PROTONIX) 40 MG tablet, Take 1 tablet (40 mg total) by mouth daily., Disp: 30 tablet, Rfl: 6 potassium chloride (K-DUR,KLOR-CON) 10 MEQ tablet, take 2 tablets by mouth once daily, Disp: 60 tablet, Rfl: 5;  Probiotic Product (PROBIOTIC DAILY PO), Take by mouth every morning., Disp: , Rfl: ;  simvastatin (ZOCOR) 40 MG tablet, Take 40 mg by mouth daily., Disp: , Rfl: ;  VITAMIN K PO, Take 100 mcg by  mouth every morning. OTC med, Disp: , Rfl:  No current facility-administered medications for this visit. Facility-Administered Medications Ordered in Other Visits: 0.9 %  sodium chloride infusion, , Intravenous, Once, Fanny Skates, MD  Allergies: No Known Allergies  Past Medical History, Surgical history, Social history, and Family History were reviewed and updated.  Review of Systems: As above  Physical Exam:  height is 5\' 10"  (1.778 m) and weight is 214 lb (97.07 kg). His oral temperature is 98.4 F (36.9 C). His blood pressure is 141/71 and his pulse is 86. His respiration is 18.   He has no ocular or oral lesions. There is no lymphadenopathy. There is no palpable liver or spleen. His lungs are clear. Cardiac exam regular rate and rhythm. There are no murmurs, rubs or bruits. Neurological exam is nonfocal. Head and neck exam shows no adenopathy. There is no petechia within the oral cavity. He has good strength in his extremities. he has no joint swelling. Skin exam shows no rashes, ecchymoses or petechia.  Lab Results  Component Value Date   WBC 5.3 10/12/2014   HGB 10.5* 10/12/2014   HCT 32.7* 10/12/2014   MCV 83 10/12/2014   PLT 186 10/12/2014     Chemistry      Component Value Date/Time   NA 143 09/14/2014 1358   NA 139 07/20/2014 0902   K 3.4* 09/14/2014 1358   K 3.3 07/20/2014 0902   CL 108 09/14/2014 1358  CL 103 07/20/2014 0902   CO2 25 09/14/2014 1358   CO2 27 07/20/2014 0902   BUN 15 09/14/2014 1358   BUN 11 07/20/2014 0902   CREATININE 1.09 09/14/2014 1358   CREATININE 0.9 07/20/2014 0902      Component Value Date/Time   CALCIUM 9.1 09/14/2014 1358   CALCIUM 8.6 07/20/2014 0902   ALKPHOS 84 09/14/2014 1358   ALKPHOS 75 07/20/2014 0902   AST 41* 09/14/2014 1358   AST 51* 07/20/2014 0902   ALT 36 09/14/2014 1358   ALT 36 07/20/2014 0902   BILITOT 0.4 09/14/2014 1358   BILITOT 0.80 07/20/2014 0902         Impression and Plan: Scott Waters is  58 year old gentleman with what I would consider to be refractory immune thrombocytopenia. We have tried him on steroids. We have tried IVIG. We have tried Rituxan. He has had only transient responses to these.  I think that a splenectomy is worthwhile. We did go ahead and give him his triple vaccines.  He does not need Nplate today.  He will still come in weekly for labs.  I think that if his blood count is above 75,000, he should have a very little bleeding with surgery.  I will plan to see him back in about one month.   [name], MD 11/18/20151:28 PM

## 2014-10-12 NOTE — Telephone Encounter (Signed)
Jammie at Dr Antonieta Pert office notified of date of surgery. Per Dr Darrel Hoover request.

## 2014-10-14 ENCOUNTER — Telehealth: Payer: Self-pay | Admitting: Hematology & Oncology

## 2014-10-14 NOTE — Telephone Encounter (Signed)
[employer] [place] PRIME THEERAPEUTICS [place]AL  Auth Nbr: [id number] Status: Approved Dates: 10/12/2014 - 10/12/2015 CPT: H6016 - NPLATE  P: [phone] F: [phone]   [employer] [place] P: [phone] - optn 2, optn 1 - pre-certification  Dos: 9/8, 9/16, 9/23, 9/30,10/7,10/14,10/21,10/28, 11/4, 11/11 - Will be paid with medical necessity review. Some of those dates have been paid thus far for his NPLATE. [id number]   COPY SCANNED

## 2014-10-19 ENCOUNTER — Ambulatory Visit (HOSPITAL_BASED_OUTPATIENT_CLINIC_OR_DEPARTMENT_OTHER): Payer: BC Managed Care – PPO

## 2014-10-19 ENCOUNTER — Other Ambulatory Visit (HOSPITAL_BASED_OUTPATIENT_CLINIC_OR_DEPARTMENT_OTHER): Payer: BC Managed Care – PPO | Admitting: Lab

## 2014-10-19 DIAGNOSIS — D693 Immune thrombocytopenic purpura: Secondary | ICD-10-CM

## 2014-10-19 LAB — CBC WITH DIFFERENTIAL (CANCER CENTER ONLY)
BASO#: 0.1 10*3/uL (ref 0.0–0.2)
BASO%: 1 % (ref 0.0–2.0)
EOS%: 3.5 % (ref 0.0–7.0)
Eosinophils Absolute: 0.2 10*3/uL (ref 0.0–0.5)
HEMATOCRIT: 33.9 % — AB (ref 38.7–49.9)
HGB: 11.1 g/dL — ABNORMAL LOW (ref 13.0–17.1)
LYMPH#: 1.1 10*3/uL (ref 0.9–3.3)
LYMPH%: 22.3 % (ref 14.0–48.0)
MCH: 26.3 pg — ABNORMAL LOW (ref 28.0–33.4)
MCHC: 32.7 g/dL (ref 32.0–35.9)
MCV: 80 fL — ABNORMAL LOW (ref 82–98)
MONO#: 0.7 10*3/uL (ref 0.1–0.9)
MONO%: 13.3 % — ABNORMAL HIGH (ref 0.0–13.0)
NEUT#: 3.1 10*3/uL (ref 1.5–6.5)
NEUT%: 59.9 % (ref 40.0–80.0)
Platelets: <6 10*3/uL — CL (ref 145–400)
RBC: 4.22 10*6/uL (ref 4.20–5.70)
RDW: 14.9 % (ref 11.1–15.7)
WBC: 5.1 10*3/uL (ref 4.0–10.0)

## 2014-10-19 MED ORDER — ROMIPLOSTIM INJECTION 500 MCG
900.0000 ug | Freq: Once | SUBCUTANEOUS | Status: AC
  Administered 2014-10-19: 900 ug via SUBCUTANEOUS
  Filled 2014-10-19: qty 1.8

## 2014-10-19 NOTE — Patient Instructions (Signed)
Romiplostim injection What is this medicine? ROMIPLOSTIM (roe mi PLOE stim) helps your body make more platelets. This medicine is used to treat low platelets caused by chronic idiopathic thrombocytopenic purpura (ITP). This medicine may be used for other purposes; ask your health care provider or pharmacist if you have questions. COMMON BRAND NAME(S): Nplate What should I tell my health care provider before I take this medicine? They need to know if you have any of these conditions: -cancer or myelodysplastic syndrome -low blood counts, like low white cell, platelet, or red cell counts -take medicines that treat or prevent blood clots -an unusual or allergic reaction to romiplostim, mannitol, other medicines, foods, dyes, or preservatives -pregnant or trying to get pregnant -breast-feeding How should I use this medicine? This medicine is for injection under the skin. It is given by a health care professional in a hospital or clinic setting. A special MedGuide will be given to you before your injection. Read this information carefully each time. Talk to your pediatrician regarding the use of this medicine in children. Special care may be needed. Overdosage: If you think you have taken too much of this medicine contact a poison control center or emergency room at once. NOTE: This medicine is only for you. Do not share this medicine with others. What if I miss a dose? It is important not to miss your dose. Call your doctor or health care professional if you are unable to keep an appointment. What may interact with this medicine? Interactions are not expected. This list may not describe all possible interactions. Give your health care provider a list of all the medicines, herbs, non-prescription drugs, or dietary supplements you use. Also tell them if you smoke, drink alcohol, or use illegal drugs. Some items may interact with your medicine. What should I watch for while using this  medicine? Your condition will be monitored carefully while you are receiving this medicine. Visit your prescriber or health care professional for regular checks on your progress and for the needed blood tests. It is important to keep all appointments. What side effects may I notice from receiving this medicine? Side effects that you should report to your doctor or health care professional as soon as possible: -allergic reactions like skin rash, itching or hives, swelling of the face, lips, or tongue -shortness of breath, chest pain, swelling in a leg -unusual bleeding or bruising Side effects that usually do not require medical attention (report to your doctor or health care professional if they continue or are bothersome): -dizziness -headache -muscle aches -pain in arms and legs -stomach pain -trouble sleeping This list may not describe all possible side effects. Call your doctor for medical advice about side effects. You may report side effects to FDA at 1-800-FDA-1088. Where should I keep my medicine? This drug is given in a hospital or clinic and will not be stored at home. NOTE: This sheet is a summary. It may not cover all possible information. If you have questions about this medicine, talk to your doctor, pharmacist, or health care provider.  2015, Elsevier/Gold Standard. (2008-07-11 15:13:04)  

## 2014-10-19 NOTE — Progress Notes (Signed)
PLT count <6   Notified Dr [name]. Patient is here for Nplate injection. Orders given to pharmacy

## 2014-10-26 ENCOUNTER — Ambulatory Visit (HOSPITAL_BASED_OUTPATIENT_CLINIC_OR_DEPARTMENT_OTHER): Payer: BC Managed Care – PPO

## 2014-10-26 ENCOUNTER — Other Ambulatory Visit (HOSPITAL_BASED_OUTPATIENT_CLINIC_OR_DEPARTMENT_OTHER): Payer: BC Managed Care – PPO | Admitting: Lab

## 2014-10-26 DIAGNOSIS — D693 Immune thrombocytopenic purpura: Secondary | ICD-10-CM

## 2014-10-26 LAB — CBC WITH DIFFERENTIAL (CANCER CENTER ONLY)
BASO#: 0.1 10*3/uL (ref 0.0–0.2)
BASO%: 0.8 % (ref 0.0–2.0)
EOS%: 4.8 % (ref 0.0–7.0)
Eosinophils Absolute: 0.3 10*3/uL (ref 0.0–0.5)
HEMATOCRIT: 33.1 % — AB (ref 38.7–49.9)
HEMOGLOBIN: 10.8 g/dL — AB (ref 13.0–17.1)
LYMPH#: 1.2 10*3/uL (ref 0.9–3.3)
LYMPH%: 18.9 % (ref 14.0–48.0)
MCH: 26.2 pg — AB (ref 28.0–33.4)
MCHC: 32.6 g/dL (ref 32.0–35.9)
MCV: 80 fL — ABNORMAL LOW (ref 82–98)
MONO#: 1 10*3/uL — ABNORMAL HIGH (ref 0.1–0.9)
MONO%: 15.8 % — AB (ref 0.0–13.0)
NEUT#: 3.9 10*3/uL (ref 1.5–6.5)
NEUT%: 59.7 % (ref 40.0–80.0)
Platelets: 189 10*3/uL (ref 145–400)
RBC: 4.13 10*6/uL — ABNORMAL LOW (ref 4.20–5.70)
RDW: 15.1 % (ref 11.1–15.7)
WBC: 6.5 10*3/uL (ref 4.0–10.0)

## 2014-10-26 MED ORDER — ROMIPLOSTIM INJECTION 500 MCG
450.0000 ug | Freq: Once | SUBCUTANEOUS | Status: AC
  Administered 2014-10-26: 450 ug via SUBCUTANEOUS
  Filled 2014-10-26: qty 0.9

## 2014-10-26 NOTE — Patient Instructions (Signed)
Romiplostim injection What is this medicine? ROMIPLOSTIM (roe mi PLOE stim) helps your body make more platelets. This medicine is used to treat low platelets caused by chronic idiopathic thrombocytopenic purpura (ITP). This medicine may be used for other purposes; ask your health care provider or pharmacist if you have questions. COMMON BRAND NAME(S): Nplate What should I tell my health care provider before I take this medicine? They need to know if you have any of these conditions: -cancer or myelodysplastic syndrome -low blood counts, like low white cell, platelet, or red cell counts -take medicines that treat or prevent blood clots -an unusual or allergic reaction to romiplostim, mannitol, other medicines, foods, dyes, or preservatives -pregnant or trying to get pregnant -breast-feeding How should I use this medicine? This medicine is for injection under the skin. It is given by a health care professional in a hospital or clinic setting. A special MedGuide will be given to you before your injection. Read this information carefully each time. Talk to your pediatrician regarding the use of this medicine in children. Special care may be needed. Overdosage: If you think you have taken too much of this medicine contact a poison control center or emergency room at once. NOTE: This medicine is only for you. Do not share this medicine with others. What if I miss a dose? It is important not to miss your dose. Call your doctor or health care professional if you are unable to keep an appointment. What may interact with this medicine? Interactions are not expected. This list may not describe all possible interactions. Give your health care provider a list of all the medicines, herbs, non-prescription drugs, or dietary supplements you use. Also tell them if you smoke, drink alcohol, or use illegal drugs. Some items may interact with your medicine. What should I watch for while using this  medicine? Your condition will be monitored carefully while you are receiving this medicine. Visit your prescriber or health care professional for regular checks on your progress and for the needed blood tests. It is important to keep all appointments. What side effects may I notice from receiving this medicine? Side effects that you should report to your doctor or health care professional as soon as possible: -allergic reactions like skin rash, itching or hives, swelling of the face, lips, or tongue -shortness of breath, chest pain, swelling in a leg -unusual bleeding or bruising Side effects that usually do not require medical attention (report to your doctor or health care professional if they continue or are bothersome): -dizziness -headache -muscle aches -pain in arms and legs -stomach pain -trouble sleeping This list may not describe all possible side effects. Call your doctor for medical advice about side effects. You may report side effects to FDA at 1-800-FDA-1088. Where should I keep my medicine? This drug is given in a hospital or clinic and will not be stored at home. NOTE: This sheet is a summary. It may not cover all possible information. If you have questions about this medicine, talk to your doctor, pharmacist, or health care provider.  2015, Elsevier/Gold Standard. (2008-07-11 15:13:04)  

## 2014-10-27 ENCOUNTER — Encounter (HOSPITAL_COMMUNITY): Payer: Self-pay

## 2014-10-27 NOTE — Patient Instructions (Addendum)
Scott Waters  10/27/2014   Your procedure is scheduled on:  11/08/14    Come thru the Emergency Room Entrance.    Follow the Signs to [place] at 0530       am  Call this number if you have problems the morning of surgery: [phone]   Remember:   Do not eat food or drink liquids after midnight.   Take these medicines the morning of surgery with A SIP OF WATER: Equate Allergy Relief if needed, Flonase nasal spray if needed, Protonix    Do not wear jewelry,   Do not wear lotions, powders, or perfumes.  deodorant.  . Men may shave face and neck.  Do not bring valuables to the hospital.  Contacts, dentures or bridgework may not be worn into surgery.  Leave suitcase in the car. After surgery it may be brought to your room.  For patients admitted to the hospital, checkout time is 11:00 AM the day of  discharge.        Please read over the following fact sheets that you were given: MRSA Information, coughing and deep breathing exercises, leg exercises            [facility] - Preparing for Surgery Before surgery, you can play an important role.  Because skin is not sterile, your skin needs to be as free of germs as possible.  You can reduce the number of germs on your skin by washing with CHG (chlorahexidine gluconate) soap before surgery.  CHG is an antiseptic cleaner which kills germs and bonds with the skin to continue killing germs even after washing. Please DO NOT use if you have an allergy to CHG or antibacterial soaps.  If your skin becomes reddened/irritated stop using the CHG and inform your nurse when you arrive at Short Stay. Do not shave (including legs and underarms) for at least 48 hours prior to the first CHG shower.  You may shave your face/neck. Please follow these instructions carefully:  1.  Shower with CHG Soap the night before surgery and the  morning of Surgery.  2.  If you choose to wash your hair, wash your hair first as usual with your  normal   shampoo.  3.  After you shampoo, rinse your hair and body thoroughly to remove the  shampoo.                           4.  Use CHG as you would any other liquid soap.  You can apply chg directly  to the skin and wash                       Gently with a scrungie or clean washcloth.  5.  Apply the CHG Soap to your body ONLY FROM THE NECK DOWN.   Do not use on face/ open                           Wound or open sores. Avoid contact with eyes, ears mouth and genitals (private parts).                       Wash face,  Genitals (private parts) with your normal soap.             6.  Wash thoroughly, paying special attention to the area where your surgery  will  be performed.  7.  Thoroughly rinse your body with warm water from the neck down.  8.  DO NOT shower/wash with your normal soap after using and rinsing off  the CHG Soap.                9.  Pat yourself dry with a clean towel.            10.  Wear clean pajamas.            11.  Place clean sheets on your bed the night of your first shower and do not  sleep with pets. Day of Surgery : Do not apply any lotions/deodorants the morning of surgery.  Please wear clean clothes to the hospital/surgery center.  FAILURE TO FOLLOW THESE INSTRUCTIONS MAY RESULT IN THE CANCELLATION OF YOUR SURGERY PATIENT SIGNATURE_________________________________  NURSE SIGNATURE__________________________________  ________________________________________________________________________  WHAT IS A BLOOD TRANSFUSION? Blood Transfusion Information  A transfusion is the replacement of blood or some of its parts. Blood is made up of multiple cells which provide different functions.  Red blood cells carry oxygen and are used for blood loss replacement.  White blood cells fight against infection.  Platelets control bleeding.  Plasma helps clot blood.  Other blood products are available for specialized needs, such as hemophilia or other clotting disorders. BEFORE THE  TRANSFUSION  Who gives blood for transfusions?   Healthy volunteers who are fully evaluated to make sure their blood is safe. This is blood bank blood. Transfusion therapy is the safest it has ever been in the practice of medicine. Before blood is taken from a donor, a complete history is taken to make sure that person has no history of diseases nor engages in risky social behavior (examples are intravenous drug use or sexual activity with multiple partners). The donor's travel history is screened to minimize risk of transmitting infections, such as malaria. The donated blood is tested for signs of infectious diseases, such as HIV and hepatitis. The blood is then tested to be sure it is compatible with you in order to minimize the chance of a transfusion reaction. If you or a relative donates blood, this is often done in anticipation of surgery and is not appropriate for emergency situations. It takes many days to process the donated blood. RISKS AND COMPLICATIONS Although transfusion therapy is very safe and saves many lives, the main dangers of transfusion include:   Getting an infectious disease.  Developing a transfusion reaction. This is an allergic reaction to something in the blood you were given. Every precaution is taken to prevent this. The decision to have a blood transfusion has been considered carefully by your caregiver before blood is given. Blood is not given unless the benefits outweigh the risks. AFTER THE TRANSFUSION  Right after receiving a blood transfusion, you will usually feel much better and more energetic. This is especially true if your red blood cells have gotten low (anemic). The transfusion raises the level of the red blood cells which carry oxygen, and this usually causes an energy increase.  The nurse administering the transfusion will monitor you carefully for complications. HOME CARE INSTRUCTIONS  No special instructions are needed after a transfusion. You may find  your energy is better. Speak with your caregiver about any limitations on activity for underlying diseases you may have. SEEK MEDICAL CARE IF:   Your condition is not improving after your transfusion.  You develop redness or irritation at the intravenous (IV) site. [place]  CARE IF:  Any of the following symptoms occur over the next 12 hours:  Shaking chills.  You have a temperature by mouth above 102 F (38.9 C), not controlled by medicine.  Chest, back, or muscle pain.  People around you feel you are not acting correctly or are confused.  Shortness of breath or difficulty breathing.  Dizziness and fainting.  You get a rash or develop hives.  You have a decrease in urine output.  Your urine turns a dark color or changes to pink, red, or brown. Any of the following symptoms occur over the next 10 days:  You have a temperature by mouth above 102 F (38.9 C), not controlled by medicine.  Shortness of breath.  Weakness after normal activity.  The white part of the eye turns yellow (jaundice).  You have a decrease in the amount of urine or are urinating less often.  Your urine turns a dark color or changes to pink, red, or brown. Document Released: 11/08/2000 Document Revised: 02/03/2012 Document Reviewed: 06/27/2008 [facility] Patient Information 2014 [place], [place].  _______________________________________________________________________

## 2014-10-31 ENCOUNTER — Encounter (HOSPITAL_COMMUNITY): Payer: Self-pay

## 2014-10-31 ENCOUNTER — Encounter (HOSPITAL_COMMUNITY)
Admission: RE | Admit: 2014-10-31 | Discharge: 2014-10-31 | Disposition: A | Discharge status: Home / Self Care | Payer: BC Managed Care – PPO | Source: Ambulatory Visit | Attending: General Surgery | Admitting: General Surgery

## 2014-10-31 ENCOUNTER — Other Ambulatory Visit (INDEPENDENT_AMBULATORY_CARE_PROVIDER_SITE_OTHER): Payer: Self-pay | Admitting: General Surgery

## 2014-10-31 DIAGNOSIS — Z01812 Encounter for preprocedural laboratory examination: Secondary | ICD-10-CM | POA: Insufficient documentation

## 2014-10-31 HISTORY — DX: Major depressive disorder, single episode, unspecified: F32.9

## 2014-10-31 HISTORY — DX: Localized edema: R60.0

## 2014-10-31 HISTORY — DX: Depression, unspecified: F32.A

## 2014-10-31 HISTORY — DX: Anxiety disorder, unspecified: F41.9

## 2014-10-31 HISTORY — DX: Frequency of micturition: R35.0

## 2014-10-31 LAB — COMPREHENSIVE METABOLIC PANEL
ALT: 31 U/L (ref 0–53)
AST: 38 U/L — AB (ref 0–37)
Albumin: 3.8 g/dL (ref 3.5–5.2)
Alkaline Phosphatase: 111 U/L (ref 39–117)
Anion gap: 15 (ref 5–15)
BUN: 19 mg/dL (ref 6–23)
CALCIUM: 9.6 mg/dL (ref 8.4–10.5)
CO2: 23 mEq/L (ref 19–32)
CREATININE: 1.08 mg/dL (ref 0.50–1.35)
Chloride: 106 mEq/L (ref 96–112)
GFR calc Af Amer: 84 mL/min — ABNORMAL LOW (ref >90)
GFR calc non Af Amer: 73 mL/min — ABNORMAL LOW (ref >90)
Glucose, Bld: 91 mg/dL (ref 70–99)
Potassium: 3.8 mEq/L (ref 3.7–5.3)
Sodium: 144 mEq/L (ref 137–147)
Total Bilirubin: 0.3 mg/dL (ref 0.3–1.2)
Total Protein: 7.1 g/dL (ref 6.0–8.3)

## 2014-10-31 LAB — CBC WITH DIFFERENTIAL/PLATELET
BASOS ABS: 0.1 10*3/uL (ref 0.0–0.1)
Basophils Relative: 1 % (ref 0–1)
EOS ABS: 0.2 10*3/uL (ref 0.0–0.7)
EOS PCT: 3 % (ref 0–5)
HCT: 34.4 % — ABNORMAL LOW (ref 39.0–52.0)
Hemoglobin: 10.8 g/dL — ABNORMAL LOW (ref 13.0–17.0)
Lymphocytes Relative: 15 % (ref 12–46)
Lymphs Abs: 1.2 10*3/uL (ref 0.7–4.0)
MCH: 25.3 pg — AB (ref 26.0–34.0)
MCHC: 31.4 g/dL (ref 30.0–36.0)
MCV: 80.6 fL (ref 78.0–100.0)
Monocytes Absolute: 1 10*3/uL (ref 0.1–1.0)
Monocytes Relative: 12 % (ref 3–12)
Neutro Abs: 5.9 10*3/uL (ref 1.7–7.7)
Neutrophils Relative %: 69 % (ref 43–77)
Platelets: 164 10*3/uL (ref 150–400)
RBC: 4.27 MIL/uL (ref 4.22–5.81)
RDW: 15.6 % — AB (ref 11.5–15.5)
WBC: 8.5 10*3/uL (ref 4.0–10.5)

## 2014-10-31 LAB — PROTIME-INR
INR: 1 (ref 0.00–1.49)
Prothrombin Time: 13.3 seconds (ref 11.6–15.2)

## 2014-10-31 LAB — APTT: APTT: 27 s (ref 24–37)

## 2014-10-31 LAB — ABO/RH: ABO/RH(D): A POS

## 2014-10-31 MED ORDER — AMLODIPINE BESYLATE 5 MG PO TABS
5.0000 mg | ORAL_TABLET | Freq: Every day | ORAL | Status: DC
  Filled 2014-10-31: qty 1

## 2014-10-31 NOTE — Progress Notes (Signed)
LOV with Dr Martha Clan- 10/12/14 EPIC  EKG- 04/27/2014 EPIC   CT chest- 04/21/14 EPIC

## 2014-11-02 ENCOUNTER — Other Ambulatory Visit: Payer: BC Managed Care – PPO | Admitting: Lab

## 2014-11-02 ENCOUNTER — Ambulatory Visit: Payer: BC Managed Care – PPO

## 2014-11-04 ENCOUNTER — Ambulatory Visit (HOSPITAL_BASED_OUTPATIENT_CLINIC_OR_DEPARTMENT_OTHER): Payer: BC Managed Care – PPO

## 2014-11-04 ENCOUNTER — Other Ambulatory Visit (HOSPITAL_BASED_OUTPATIENT_CLINIC_OR_DEPARTMENT_OTHER): Payer: BC Managed Care – PPO | Admitting: Lab

## 2014-11-04 DIAGNOSIS — D693 Immune thrombocytopenic purpura: Secondary | ICD-10-CM

## 2014-11-04 LAB — CBC WITH DIFFERENTIAL (CANCER CENTER ONLY)
BASO#: 0 10*3/uL (ref 0.0–0.2)
BASO%: 0.4 % (ref 0.0–2.0)
EOS ABS: 0.3 10*3/uL (ref 0.0–0.5)
EOS%: 4.4 % (ref 0.0–7.0)
HEMATOCRIT: 33.5 % — AB (ref 38.7–49.9)
HGB: 10.9 g/dL — ABNORMAL LOW (ref 13.0–17.1)
LYMPH#: 1.1 10*3/uL (ref 0.9–3.3)
LYMPH%: 15.6 % (ref 14.0–48.0)
MCH: 26.3 pg — ABNORMAL LOW (ref 28.0–33.4)
MCHC: 32.5 g/dL (ref 32.0–35.9)
MCV: 81 fL — AB (ref 82–98)
MONO#: 0.8 10*3/uL (ref 0.1–0.9)
MONO%: 11.1 % (ref 0.0–13.0)
NEUT#: 4.8 10*3/uL (ref 1.5–6.5)
NEUT%: 68.5 % (ref 40.0–80.0)
RBC: 4.15 10*6/uL — AB (ref 4.20–5.70)
RDW: 15.8 % — ABNORMAL HIGH (ref 11.1–15.7)
WBC: 7 10*3/uL (ref 4.0–10.0)

## 2014-11-04 MED ORDER — ROMIPLOSTIM INJECTION 500 MCG
450.0000 ug | Freq: Once | SUBCUTANEOUS | Status: AC
  Administered 2014-11-04: 450 ug via SUBCUTANEOUS
  Filled 2014-11-04: qty 0.9

## 2014-11-04 MED ORDER — ROMIPLOSTIM INJECTION 500 MCG
450.0000 ug | Freq: Once | SUBCUTANEOUS | Status: DC
  Filled 2014-11-04: qty 0.9

## 2014-11-04 NOTE — Patient Instructions (Signed)
Romiplostim injection What is this medicine? ROMIPLOSTIM (roe mi PLOE stim) helps your body make more platelets. This medicine is used to treat low platelets caused by chronic idiopathic thrombocytopenic purpura (ITP). This medicine may be used for other purposes; ask your health care provider or pharmacist if you have questions. COMMON BRAND NAME(S): Nplate What should I tell my health care provider before I take this medicine? They need to know if you have any of these conditions: -cancer or myelodysplastic syndrome -low blood counts, like low white cell, platelet, or red cell counts -take medicines that treat or prevent blood clots -an unusual or allergic reaction to romiplostim, mannitol, other medicines, foods, dyes, or preservatives -pregnant or trying to get pregnant -breast-feeding How should I use this medicine? This medicine is for injection under the skin. It is given by a health care professional in a hospital or clinic setting. A special MedGuide will be given to you before your injection. Read this information carefully each time. Talk to your pediatrician regarding the use of this medicine in children. Special care may be needed. Overdosage: If you think you have taken too much of this medicine contact a poison control center or emergency room at once. NOTE: This medicine is only for you. Do not share this medicine with others. What if I miss a dose? It is important not to miss your dose. Call your doctor or health care professional if you are unable to keep an appointment. What may interact with this medicine? Interactions are not expected. This list may not describe all possible interactions. Give your health care provider a list of all the medicines, herbs, non-prescription drugs, or dietary supplements you use. Also tell them if you smoke, drink alcohol, or use illegal drugs. Some items may interact with your medicine. What should I watch for while using this  medicine? Your condition will be monitored carefully while you are receiving this medicine. Visit your prescriber or health care professional for regular checks on your progress and for the needed blood tests. It is important to keep all appointments. What side effects may I notice from receiving this medicine? Side effects that you should report to your doctor or health care professional as soon as possible: -allergic reactions like skin rash, itching or hives, swelling of the face, lips, or tongue -shortness of breath, chest pain, swelling in a leg -unusual bleeding or bruising Side effects that usually do not require medical attention (report to your doctor or health care professional if they continue or are bothersome): -dizziness -headache -muscle aches -pain in arms and legs -stomach pain -trouble sleeping This list may not describe all possible side effects. Call your doctor for medical advice about side effects. You may report side effects to FDA at 1-800-FDA-1088. Where should I keep my medicine? This drug is given in a hospital or clinic and will not be stored at home. NOTE: This sheet is a summary. It may not cover all possible information. If you have questions about this medicine, talk to your doctor, pharmacist, or health care provider.  2015, Elsevier/Gold Standard. (2008-07-11 15:13:04)  

## 2014-11-06 NOTE — H&P (Signed)
Scott Waters  Location: [facility] Patient #: [id number] DOB: 10-04-1956 Single / Language: [name] / Race: Black or African American Male      History of Present Illness  Patient words: splendectomy.  The patient is a 58 year old male who presents with a complaint of idiopathic thrombocytopenic purpura. This is a 58 year old African-American man, referred by Dr. Burney Waters for consideration of splenectomy for ITP. Dr. Shanon Waters is his PCP In May of this year he noticed some easy bleeding from his nose and gums. Dr. Regis Waters diagnosed thrombocytopenia and he was admitted to the hospital. Subsequently he has been followed and treated by Dr. Marin Waters. CT scan shows a normal spleen and there are no accessory spleens visible to the radiologist. I have reviewed this with him. He has been on numerous drugs but the only thing that works is Scott Waters. Platelet count on October 28 was 287,000. Platelet count on November 4 dropped to less than 6000. Platelet count on November 11 was up to 102,000. Comorbidities include hypertension and hyperlipidemia. He has never had abdominal surgery. Last colonoscopy 10 years ago reveals some polyps. He is divorced has 1 son. Quit tobacco 10 years ago but drinks alcohol daily. 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   Other Problems  High blood pressure Hypercholesterolemia  Past Surgical History  Colon Polyp Removal - Colonoscopy Oral Surgery Tonsillectomy  Diagnostic Studies History  Colonoscopy 5-10 years ago  Allergies  No Known Drug Allergies11/16/2015  Medication History  Azor (10-40MG  Tablet, Oral) Active. Dexamethasone (4MG  Tablet, Oral) Active. Famciclovir (500MG  Tablet, Oral) Active. Fluticasone Propionate (50MCG/ACT Suspension, Nasal) Active. Folic Acid (1MG  Tablet, Oral) Active. Pantoprazole Sodium (40MG  Tablet DR, Oral) Active. Potassium Chloride Crys ER (10MEQ Tablet ER, Oral)  Active. Potassium Chloride Crys ER (20MEQ Tablet ER, Oral) Active. PredniSONE (20MG  Tablet, Oral) Active. Simvastatin (40MG  Tablet, Oral) Active. Sulfamethoxazole-Trimethoprim (800-160MG  Tablet, Oral) Active.  Social History  Alcohol use Moderate alcohol use. Caffeine use Carbonated beverages, Coffee, Tea. Illicit drug use Remotely quit drug use. Tobacco use Former smoker.  Family History Alcohol Abuse Father. Kidney Disease Family Members In General, Sister. Rectal Cancer Father.  Review of Systems  General Not Present- Appetite Loss, Chills, Fatigue, Fever, Night Sweats, Weight Gain and Weight Loss. Skin Not Present- Change in Wart/Mole, Dryness, Hives, Jaundice, New Lesions, Non-Healing Wounds, Rash and Ulcer. HEENT Present- Wears glasses/contact lenses. Not Present- Earache, Hearing Loss, Hoarseness, Nose Bleed, Oral Ulcers, Ringing in the Ears, Seasonal Allergies, Sinus Pain, Sore Throat, Visual Disturbances and Yellow Eyes. Respiratory Present- Snoring. Not Present- Bloody sputum, Chronic Cough, Difficulty Breathing and Wheezing. Breast Not Present- Breast Mass, Breast Pain, Nipple Discharge and Skin Changes. Cardiovascular Not Present- Chest Pain, Difficulty Breathing Lying Down, Leg Cramps, Palpitations, Rapid Heart Rate, Shortness of Breath and Swelling of Extremities. Gastrointestinal Not Present- Abdominal Pain, Bloating, Bloody Stool, Change in Bowel Habits, Chronic diarrhea, Constipation, Difficulty Swallowing, Excessive gas, Gets full quickly at meals, Hemorrhoids, Indigestion, Nausea, Rectal Pain and Vomiting. Male Genitourinary Present- Frequency, Nocturia and Urgency. Not Present- Blood in Urine, Change in Urinary Stream, Impotence, Painful Urination and Urine Leakage. Musculoskeletal Present- Swelling of Extremities. Not Present- Back Pain, Joint Pain, Joint Stiffness, Muscle Pain and Muscle Weakness. Neurological Not Present- Decreased Memory, Fainting,  Headaches, Numbness, Seizures, Tingling, Tremor, Trouble walking and Weakness. Endocrine Not Present- Cold Intolerance, Excessive Hunger, Hair Changes, Heat Intolerance, Hot flashes and New Diabetes. Hematology Present- Easy Bruising. Not Present- Excessive bleeding, Gland problems, HIV and Persistent  Infections.   Vitals  10/10/2014 3:52 PM Weight: 211.38 lb Height: 68in Body Surface Area: 2.14 m Body Mass Index: 32.14 kg/m Temp.: 97.42F  Pulse: 88 (Regular)  BP: 116/82 (Sitting, Left Arm, Standard)    Physical Exam  General Mental Status-Alert. General Appearance-Consistent with stated age. Hydration-Well hydrated. Voice-Normal.  Head and Neck Head-normocephalic, atraumatic with no lesions or palpable masses. Trachea-midline. Thyroid Gland Characteristics - normal size and consistency.  Eye Eyeball - Bilateral-Extraocular movements intact. Sclera/Conjunctiva - Bilateral-No scleral icterus.  Chest and Lung Exam Chest and lung exam reveals -quiet, even and easy respiratory effort with no use of accessory muscles and on auscultation, normal breath sounds, no adventitious sounds and normal vocal resonance. Inspection Chest Wall - Normal. Back - normal.  Breast Breast - Left-Symmetric, Non Tender, No Biopsy scars, no Dimpling, No Inflammation, No Lumpectomy scars, No Mastectomy scars, No Peau d' Orange. Breast - Right-Symmetric, Non Tender, No Biopsy scars, no Dimpling, No Inflammation, No Lumpectomy scars, No Mastectomy scars, No Peau d' Orange. Breast Lump-No Palpable Breast Mass.  Cardiovascular Cardiovascular examination reveals -normal heart sounds, regular rate and rhythm with no murmurs and normal pedal pulses bilaterally.  Abdomen Inspection Inspection of the abdomen reveals - No Hernias. Skin - Scar - no surgical scars. Palpation/Percussion Palpation and Percussion of the abdomen reveal - Soft, Non Tender, No Rebound  tenderness, No Rigidity (guarding) and No hepatosplenomegaly. Auscultation Auscultation of the abdomen reveals - Bowel sounds normal. Note: Abdomen is soft. Spleen and liver are not palpable. No hernias or mass. Wide costal margin.   Neurologic Neurologic evaluation reveals -alert and oriented x 3 with no impairment of recent or remote memory. Mental Status-Normal.  Musculoskeletal Normal Exam - Left-Upper Extremity Strength Normal and Lower Extremity Strength Normal. Normal Exam - Right-Upper Extremity Strength Normal and Lower Extremity Strength Normal.  Lymphatic Head & Neck  General Head & Neck Lymphatics: Bilateral - Description - Normal. Axillary  General Axillary Region: Bilateral - Description - Normal. Tenderness - Non Tender. Femoral & Inguinal  Generalized Femoral & Inguinal Lymphatics: Bilateral - Description - Normal. Tenderness - Non Tender.    Assessment & Plan  ITP (IDIOPATHIC THROMBOCYTOPENIC PURPURA) (287.31  D69.3) Current Plans  Schedule for Surgery Dr. Marin Waters has done a complete workup for your ITP. No further testing is necessary The options for medical management have been exhausted, and you were referred for a splenectomy. I agree that that is an appropriate best option for you at this point You will be scheduled for a laparoscopic splenectomy, possible open in the near future Dr. Marin Waters will need to treat you for a week or 2 prior to the surgery to raise your platelet count. We will let him know the date of surgery We have discussed the techniques for surgery and all of the risks.  HYPERTENSION, BENIGN (401.1  I10)  HYPERLIPIDEMIA, ACQUIRED (272.4  E78.5)  OBESITY (BMI 30.0-34.9) (278.00  E66.9)    Latice Waitman M. Dalbert Batman, M.D., [facility] General and Minimally invasive Surgery Breast and Colorectal Surgery Office:   [phone] Pager:   [phone]

## 2014-11-07 NOTE — Progress Notes (Signed)
[facility] called and Beth stated Dr Dalbert Batman had called them this am to make sure they had platelets available for patient.  Blood Bank instructed me to release the prepare platelet order , which I did .  They stated platelets would be ready for patient in am for surgery.  They are aware patient will be arriving at 0530am and surgery at 0730am.

## 2014-11-07 NOTE — Anesthesia Preprocedure Evaluation (Addendum)
Anesthesia Evaluation  Patient identified by MRN, date of birth, ID band Patient awake    Reviewed: Allergy & Precautions, H&P , NPO status , Patient's Chart, lab work & pertinent test results  History of Anesthesia Complications Negative for: history of anesthetic complications  Airway Mallampati: III  TM Distance: >3 FB Neck ROM: Full    Dental no notable dental hx. (+) Dental Advisory Given, Edentulous Upper   Pulmonary former smoker,  breath sounds clear to auscultation  Pulmonary exam normal       Cardiovascular hypertension, Pt. on medications Rhythm:Regular Rate:Normal     Neuro/Psych PSYCHIATRIC DISORDERS Anxiety Depression negative neurological ROS     GI/Hepatic Neg liver ROS, GERD-  Medicated and Controlled,  Endo/Other  obesity  Renal/GU negative Renal ROS  negative genitourinary   Musculoskeletal  (+) Arthritis -, Osteoarthritis,    Abdominal (+) - obese,   Peds negative pediatric ROS (+)  Hematology ITP refractory to medical therapy   Anesthesia Other Findings preop plt count 15,000 with surgeon aware. Will have 2 units of plts in the room and give after arterial clamping if needed  Reproductive/Obstetrics negative OB ROS                            Anesthesia Physical Anesthesia Plan  ASA: III  Anesthesia Plan: General   Post-op Pain Management:    Induction: Intravenous  Airway Management Planned: Oral ETT  Additional Equipment:   Intra-op Plan:   Post-operative Plan: Extubation in OR  Informed Consent: I have reviewed the patients History and Physical, chart, labs and discussed the procedure including the risks, benefits and alternatives for the proposed anesthesia with the patient or authorized representative who has indicated his/her understanding and acceptance.   Dental advisory given  Plan Discussed with: CRNA  Anesthesia Plan Comments:          Anesthesia Quick Evaluation

## 2014-11-08 ENCOUNTER — Inpatient Hospital Stay (HOSPITAL_COMMUNITY)
Admission: RE | Admit: 2014-11-08 | Discharge: 2014-11-18 | DRG: 800 | Disposition: A | Discharge status: Home / Self Care | Payer: BC Managed Care – PPO | Source: Ambulatory Visit | Attending: General Surgery | Admitting: General Surgery

## 2014-11-08 ENCOUNTER — Encounter (HOSPITAL_COMMUNITY)
Admission: RE | Disposition: A | Discharge status: Home / Self Care | Payer: Self-pay | Source: Ambulatory Visit | Attending: General Surgery

## 2014-11-08 ENCOUNTER — Inpatient Hospital Stay (HOSPITAL_COMMUNITY): Payer: BC Managed Care – PPO | Admitting: Anesthesiology

## 2014-11-08 ENCOUNTER — Encounter (HOSPITAL_COMMUNITY): Payer: Self-pay | Admitting: Anesthesiology

## 2014-11-08 DIAGNOSIS — Z9889 Other specified postprocedural states: Secondary | ICD-10-CM

## 2014-11-08 DIAGNOSIS — D649 Anemia, unspecified: Secondary | ICD-10-CM | POA: Diagnosis present

## 2014-11-08 DIAGNOSIS — E669 Obesity, unspecified: Secondary | ICD-10-CM | POA: Diagnosis present

## 2014-11-08 DIAGNOSIS — M109 Gout, unspecified: Secondary | ICD-10-CM | POA: Diagnosis present

## 2014-11-08 DIAGNOSIS — E785 Hyperlipidemia, unspecified: Secondary | ICD-10-CM | POA: Diagnosis present

## 2014-11-08 DIAGNOSIS — D693 Immune thrombocytopenic purpura: Principal | ICD-10-CM | POA: Diagnosis present

## 2014-11-08 DIAGNOSIS — E780 Pure hypercholesterolemia: Secondary | ICD-10-CM | POA: Diagnosis present

## 2014-11-08 DIAGNOSIS — D473 Essential (hemorrhagic) thrombocythemia: Secondary | ICD-10-CM | POA: Diagnosis present

## 2014-11-08 DIAGNOSIS — Z800 Family history of malignant neoplasm of digestive organs: Secondary | ICD-10-CM

## 2014-11-08 DIAGNOSIS — Z6830 Body mass index (BMI) 30.0-30.9, adult: Secondary | ICD-10-CM

## 2014-11-08 DIAGNOSIS — K219 Gastro-esophageal reflux disease without esophagitis: Secondary | ICD-10-CM | POA: Diagnosis present

## 2014-11-08 DIAGNOSIS — I10 Essential (primary) hypertension: Secondary | ICD-10-CM | POA: Diagnosis present

## 2014-11-08 DIAGNOSIS — R112 Nausea with vomiting, unspecified: Secondary | ICD-10-CM

## 2014-11-08 DIAGNOSIS — Z87891 Personal history of nicotine dependence: Secondary | ICD-10-CM

## 2014-11-08 DIAGNOSIS — K859 Acute pancreatitis without necrosis or infection, unspecified: Secondary | ICD-10-CM

## 2014-11-08 DIAGNOSIS — Z86010 Personal history of colonic polyps: Secondary | ICD-10-CM | POA: Diagnosis not present

## 2014-11-08 DIAGNOSIS — K430 Incisional hernia with obstruction, without gangrene: Secondary | ICD-10-CM | POA: Diagnosis not present

## 2014-11-08 DIAGNOSIS — D72829 Elevated white blood cell count, unspecified: Secondary | ICD-10-CM | POA: Diagnosis present

## 2014-11-08 HISTORY — PX: LAPAROSCOPIC SPLENECTOMY: SHX409

## 2014-11-08 LAB — CBC
HCT: 36.4 % — ABNORMAL LOW (ref 39.0–52.0)
HCT: 31.7 % — ABNORMAL LOW (ref 39.0–52.0)
HEMATOCRIT: 31.1 % — AB (ref 39.0–52.0)
HEMOGLOBIN: 9.7 g/dL — AB (ref 13.0–17.0)
Hemoglobin: 11.5 g/dL — ABNORMAL LOW (ref 13.0–17.0)
Hemoglobin: 10 g/dL — ABNORMAL LOW (ref 13.0–17.0)
MCH: 25.6 pg — ABNORMAL LOW (ref 26.0–34.0)
MCH: 25.5 pg — AB (ref 26.0–34.0)
MCH: 25.3 pg — ABNORMAL LOW (ref 26.0–34.0)
MCHC: 31.6 g/dL (ref 30.0–36.0)
MCHC: 31.2 g/dL (ref 30.0–36.0)
MCHC: 31.5 g/dL (ref 30.0–36.0)
MCV: 80.9 fL (ref 78.0–100.0)
MCV: 81.6 fL (ref 78.0–100.0)
MCV: 80.3 fL (ref 78.0–100.0)
Platelets: 15 10*3/uL — CL (ref 150–400)
Platelets: 112 10*3/uL — ABNORMAL LOW (ref 150–400)
Platelets: 102 10*3/uL — ABNORMAL LOW (ref 150–400)
RBC: 4.5 MIL/uL (ref 4.22–5.81)
RBC: 3.81 MIL/uL — ABNORMAL LOW (ref 4.22–5.81)
RBC: 3.95 MIL/uL — ABNORMAL LOW (ref 4.22–5.81)
RDW: 16 % — AB (ref 11.5–15.5)
RDW: 16 % — ABNORMAL HIGH (ref 11.5–15.5)
RDW: 15.8 % — AB (ref 11.5–15.5)
WBC: 8.6 10*3/uL (ref 4.0–10.5)
WBC: 25.4 10*3/uL — ABNORMAL HIGH (ref 4.0–10.5)
WBC: 20.3 10*3/uL — ABNORMAL HIGH (ref 4.0–10.5)

## 2014-11-08 LAB — PREPARE RBC (CROSSMATCH)

## 2014-11-08 LAB — MRSA PCR SCREENING: MRSA by PCR: NEGATIVE

## 2014-11-08 SURGERY — SPLENECTOMY, LAPAROSCOPIC
Anesthesia: General | Site: Abdomen

## 2014-11-08 MED ORDER — ONDANSETRON HCL 4 MG/2ML IJ SOLN: 4.0000 mg | Freq: Once | INTRAMUSCULAR | Status: DC | PRN

## 2014-11-08 MED ORDER — LIDOCAINE HCL (CARDIAC) 20 MG/ML IV SOLN
INTRAVENOUS | Status: AC
  Filled 2014-11-08: qty 5

## 2014-11-08 MED ORDER — AMLODIPINE-OLMESARTAN 10-40 MG PO TABS: 1.0000 | ORAL_TABLET | Freq: Every morning | ORAL | Status: DC

## 2014-11-08 MED ORDER — LACTATED RINGERS IV SOLN
INTRAVENOUS | Status: DC | PRN
  Administered 2014-11-08: 1000 mL
  Administered 2014-11-08 (×2): via INTRAVENOUS

## 2014-11-08 MED ORDER — OXYCODONE-ACETAMINOPHEN 5-325 MG PO TABS
1.0000 | ORAL_TABLET | ORAL | Status: DC | PRN
  Administered 2014-11-09 – 2014-11-13 (×3): 2 via ORAL
  Filled 2014-11-08 (×4): qty 2

## 2014-11-08 MED ORDER — MIDAZOLAM HCL 2 MG/2ML IJ SOLN
INTRAMUSCULAR | Status: AC
  Filled 2014-11-08: qty 2

## 2014-11-08 MED ORDER — CEFAZOLIN SODIUM-DEXTROSE 2-3 GM-% IV SOLR: 2.0000 g | INTRAVENOUS | Status: DC

## 2014-11-08 MED ORDER — BUPIVACAINE-EPINEPHRINE 0.5% -1:200000 IJ SOLN
INTRAMUSCULAR | Status: DC | PRN
  Administered 2014-11-08: 25 mL

## 2014-11-08 MED ORDER — HYDROMORPHONE HCL 1 MG/ML IJ SOLN
1.0000 mg | INTRAMUSCULAR | Status: DC | PRN
  Administered 2014-11-08 – 2014-11-14 (×14): 1 mg via INTRAVENOUS
  Filled 2014-11-08 (×14): qty 1

## 2014-11-08 MED ORDER — CHLORHEXIDINE GLUCONATE 4 % EX LIQD: 1.0000 "application " | Freq: Once | CUTANEOUS | Status: DC

## 2014-11-08 MED ORDER — POTASSIUM CHLORIDE IN NACL 20-0.9 MEQ/L-% IV SOLN
INTRAVENOUS | Status: DC
  Administered 2014-11-08 – 2014-11-11 (×8): via INTRAVENOUS
  Administered 2014-11-12: 75 mL via INTRAVENOUS
  Administered 2014-11-13 – 2014-11-14 (×3): via INTRAVENOUS
  Filled 2014-11-08 (×11): qty 1000

## 2014-11-08 MED ORDER — CEFAZOLIN SODIUM-DEXTROSE 2-3 GM-% IV SOLR
2.0000 g | Freq: Three times a day (TID) | INTRAVENOUS | Status: AC
  Administered 2014-11-08 – 2014-11-09 (×3): 2 g via INTRAVENOUS
  Filled 2014-11-08 (×3): qty 50

## 2014-11-08 MED ORDER — LIDOCAINE HCL (CARDIAC) 20 MG/ML IV SOLN
INTRAVENOUS | Status: DC | PRN
  Administered 2014-11-08: 100 mg via INTRAVENOUS

## 2014-11-08 MED ORDER — NEOSTIGMINE METHYLSULFATE 10 MG/10ML IV SOLN
INTRAVENOUS | Status: DC | PRN
  Administered 2014-11-08: 4 mg via INTRAVENOUS

## 2014-11-08 MED ORDER — SODIUM CHLORIDE 0.9 % IV SOLN: Freq: Once | INTRAVENOUS | Status: DC

## 2014-11-08 MED ORDER — BUPIVACAINE-EPINEPHRINE 0.5% -1:200000 IJ SOLN
INTRAMUSCULAR | Status: AC
  Filled 2014-11-08: qty 1

## 2014-11-08 MED ORDER — TISSEEL VH 10 ML EX KIT
PACK | CUTANEOUS | Status: AC
  Filled 2014-11-08: qty 1

## 2014-11-08 MED ORDER — CEFAZOLIN SODIUM-DEXTROSE 2-3 GM-% IV SOLR
INTRAVENOUS | Status: AC
  Filled 2014-11-08: qty 50

## 2014-11-08 MED ORDER — SIMVASTATIN 40 MG PO TABS: 40.0000 mg | ORAL_TABLET | Freq: Every morning | ORAL | Status: DC

## 2014-11-08 MED ORDER — FOLIC ACID 1 MG PO TABS
1.0000 mg | ORAL_TABLET | Freq: Every day | ORAL | Status: DC
  Administered 2014-11-09 – 2014-11-14 (×6): 1 mg via ORAL
  Filled 2014-11-08 (×6): qty 1

## 2014-11-08 MED ORDER — SODIUM CHLORIDE 0.9 % IJ SOLN
INTRAMUSCULAR | Status: AC
  Filled 2014-11-08: qty 10

## 2014-11-08 MED ORDER — ONDANSETRON HCL 4 MG PO TABS: 4.0000 mg | ORAL_TABLET | Freq: Four times a day (QID) | ORAL | Status: DC | PRN

## 2014-11-08 MED ORDER — FLUTICASONE PROPIONATE 50 MCG/ACT NA SUSP
2.0000 | Freq: Every day | NASAL | Status: DC | PRN
  Filled 2014-11-08: qty 16

## 2014-11-08 MED ORDER — IRBESARTAN 300 MG PO TABS
300.0000 mg | ORAL_TABLET | Freq: Every day | ORAL | Status: DC
  Administered 2014-11-09 – 2014-11-14 (×6): 300 mg via ORAL
  Filled 2014-11-08 (×7): qty 1

## 2014-11-08 MED ORDER — ONDANSETRON HCL 4 MG/2ML IJ SOLN
INTRAMUSCULAR | Status: AC
  Filled 2014-11-08: qty 2

## 2014-11-08 MED ORDER — POTASSIUM CHLORIDE IN NACL 20-0.9 MEQ/L-% IV SOLN
INTRAVENOUS | Status: AC
  Filled 2014-11-08: qty 1000

## 2014-11-08 MED ORDER — TISSEEL VH 10 ML EX KIT
PACK | CUTANEOUS | Status: DC | PRN
  Administered 2014-11-08: 10 mL

## 2014-11-08 MED ORDER — CISATRACURIUM BESYLATE 20 MG/10ML IV SOLN
INTRAVENOUS | Status: AC
  Filled 2014-11-08: qty 10

## 2014-11-08 MED ORDER — PANTOPRAZOLE SODIUM 40 MG PO TBEC
40.0000 mg | DELAYED_RELEASE_TABLET | Freq: Every day | ORAL | Status: DC
  Administered 2014-11-09 – 2014-11-13 (×5): 40 mg via ORAL
  Filled 2014-11-08 (×6): qty 1

## 2014-11-08 MED ORDER — DEXAMETHASONE SODIUM PHOSPHATE 10 MG/ML IJ SOLN
INTRAMUSCULAR | Status: DC | PRN
  Administered 2014-11-08: 10 mg via INTRAVENOUS

## 2014-11-08 MED ORDER — HYDROMORPHONE HCL 1 MG/ML IJ SOLN
INTRAMUSCULAR | Status: DC | PRN
  Administered 2014-11-08 (×3): .4 mg via INTRAVENOUS

## 2014-11-08 MED ORDER — ATORVASTATIN CALCIUM 20 MG PO TABS
20.0000 mg | ORAL_TABLET | Freq: Every day | ORAL | Status: DC
  Administered 2014-11-08 – 2014-11-13 (×6): 20 mg via ORAL
  Filled 2014-11-08: qty 1
  Filled 2014-11-08: qty 2
  Filled 2014-11-08 (×5): qty 1

## 2014-11-08 MED ORDER — DEXAMETHASONE SODIUM PHOSPHATE 10 MG/ML IJ SOLN
INTRAMUSCULAR | Status: AC
  Filled 2014-11-08: qty 1

## 2014-11-08 MED ORDER — PROPOFOL 10 MG/ML IV BOLUS
INTRAVENOUS | Status: AC
  Filled 2014-11-08: qty 20

## 2014-11-08 MED ORDER — HYDROMORPHONE HCL 2 MG/ML IJ SOLN
INTRAMUSCULAR | Status: AC
  Filled 2014-11-08: qty 1

## 2014-11-08 MED ORDER — ADULT MULTIVITAMIN W/MINERALS CH
1.0000 | ORAL_TABLET | Freq: Every morning | ORAL | Status: DC
Start: 2014-11-08 — End: 2014-11-14
  Administered 2014-11-09 – 2014-11-14 (×6): 1 via ORAL
  Filled 2014-11-08 (×6): qty 1

## 2014-11-08 MED ORDER — PROPOFOL 10 MG/ML IV BOLUS
INTRAVENOUS | Status: DC | PRN
  Administered 2014-11-08: 190 mg via INTRAVENOUS

## 2014-11-08 MED ORDER — ONDANSETRON HCL 4 MG/2ML IJ SOLN
4.0000 mg | Freq: Four times a day (QID) | INTRAMUSCULAR | Status: DC | PRN
  Administered 2014-11-10 – 2014-11-14 (×7): 4 mg via INTRAVENOUS
  Filled 2014-11-08 (×7): qty 2

## 2014-11-08 MED ORDER — SUFENTANIL CITRATE 50 MCG/ML IV SOLN
INTRAVENOUS | Status: DC | PRN
  Administered 2014-11-08 (×3): 10 ug via INTRAVENOUS
  Administered 2014-11-08: 20 ug via INTRAVENOUS

## 2014-11-08 MED ORDER — FENTANYL CITRATE 0.05 MG/ML IJ SOLN: 25.0000 ug | INTRAMUSCULAR | Status: DC | PRN

## 2014-11-08 MED ORDER — GLYCOPYRROLATE 0.2 MG/ML IJ SOLN
INTRAMUSCULAR | Status: DC | PRN
  Administered 2014-11-08: .6 mg via INTRAVENOUS

## 2014-11-08 MED ORDER — SUCCINYLCHOLINE CHLORIDE 20 MG/ML IJ SOLN
INTRAMUSCULAR | Status: DC | PRN
  Administered 2014-11-08: 100 mg via INTRAVENOUS

## 2014-11-08 MED ORDER — EPHEDRINE SULFATE 50 MG/ML IJ SOLN
INTRAMUSCULAR | Status: AC
  Filled 2014-11-08: qty 1

## 2014-11-08 MED ORDER — CEFAZOLIN SODIUM-DEXTROSE 2-3 GM-% IV SOLR
2.0000 g | INTRAVENOUS | Status: AC
  Administered 2014-11-08: 2 g via INTRAVENOUS

## 2014-11-08 MED ORDER — SUFENTANIL CITRATE 50 MCG/ML IV SOLN
INTRAVENOUS | Status: AC
  Filled 2014-11-08: qty 1

## 2014-11-08 MED ORDER — CISATRACURIUM BESYLATE (PF) 10 MG/5ML IV SOLN
INTRAVENOUS | Status: DC | PRN
  Administered 2014-11-08: 4 mg via INTRAVENOUS
  Administered 2014-11-08: 12 mg via INTRAVENOUS
  Administered 2014-11-08: 2 mg via INTRAVENOUS

## 2014-11-08 MED ORDER — SODIUM CHLORIDE 0.9 % IV SOLN
INTRAVENOUS | Status: DC | PRN
  Administered 2014-11-08: 08:00:00 via INTRAVENOUS

## 2014-11-08 MED ORDER — ONDANSETRON HCL 4 MG/2ML IJ SOLN
INTRAMUSCULAR | Status: DC | PRN
  Administered 2014-11-08: 4 mg via INTRAVENOUS

## 2014-11-08 MED ORDER — AMLODIPINE BESYLATE 10 MG PO TABS
10.0000 mg | ORAL_TABLET | Freq: Every day | ORAL | Status: DC
  Administered 2014-11-09 – 2014-11-14 (×6): 10 mg via ORAL
  Filled 2014-11-08 (×6): qty 1

## 2014-11-08 MED ORDER — MIDAZOLAM HCL 5 MG/5ML IJ SOLN
INTRAMUSCULAR | Status: DC | PRN
  Administered 2014-11-08: 2 mg via INTRAVENOUS

## 2014-11-08 SURGICAL SUPPLY — 56 items
ANCHOR TIS RET SYS 200 (MISCELLANEOUS) IMPLANT
APPLIER CLIP 5 13 M/L LIGAMAX5 (MISCELLANEOUS) ×3
APPLIER CLIP ROT 10 11.4 M/L (STAPLE) ×3
BLADE EXTENDED COATED 6.5IN (ELECTRODE) IMPLANT
BLADE SURG SZ10 CARB STEEL (BLADE) IMPLANT
CLAMP ENDO BABCK 10MM (STAPLE) IMPLANT
CLIP APPLIE 5 13 M/L LIGAMAX5 (MISCELLANEOUS) ×1 IMPLANT
CLIP APPLIE ROT 10 11.4 M/L (STAPLE) ×1 IMPLANT
CONNECTOR 5 IN 1 STRAIGHT STRL (MISCELLANEOUS) ×3 IMPLANT
DECANTER SPIKE VIAL GLASS SM (MISCELLANEOUS) ×3 IMPLANT
DISSECTOR BLUNT TIP ENDO 5MM (MISCELLANEOUS) IMPLANT
DRAIN CHANNEL 19F RND (DRAIN) ×3 IMPLANT
DRAPE LAPAROSCOPIC ABDOMINAL (DRAPES) ×3 IMPLANT
DRAPE WARM FLUID 44X44 (DRAPE) ×3 IMPLANT
DUPLOJECT EASY PREP 4ML (MISCELLANEOUS) ×3 IMPLANT
EVACUATOR SILICONE 100CC (DRAIN) ×3 IMPLANT
FILTER SMOKE EVAC LAPAROSHD (FILTER) IMPLANT
GLOVE EUDERMIC 7 POWDERFREE (GLOVE) ×6 IMPLANT
GOWN STRL REUS W/TWL LRG LVL3 (GOWN DISPOSABLE) IMPLANT
GOWN STRL REUS W/TWL XL LVL3 (GOWN DISPOSABLE) ×12 IMPLANT
HEMOSTAT SNOW SURGICEL 2X4 (HEMOSTASIS) IMPLANT
HEMOSTAT SURGICEL 4X8 (HEMOSTASIS) IMPLANT
KIT BASIN OR (CUSTOM PROCEDURE TRAY) ×3 IMPLANT
NS IRRIG 1000ML POUR BTL (IV SOLUTION) ×3 IMPLANT
PENCIL BUTTON HOLSTER BLD 10FT (ELECTRODE) ×3 IMPLANT
POUCH ENDO CATCH II 15MM (MISCELLANEOUS) ×3 IMPLANT
RELOAD STAPLER BLUE 60MM (STAPLE) IMPLANT
RELOAD STAPLER WHITE 60MM (STAPLE) IMPLANT
RELOAD WHITE ECR60W (STAPLE) ×6 IMPLANT
SCISSORS LAP 5X35 DISP (ENDOMECHANICALS) ×3 IMPLANT
SEALANT SURGICAL APPL DUAL CAN (MISCELLANEOUS) IMPLANT
SET IRRIG TUBING LAPAROSCOPIC (IRRIGATION / IRRIGATOR) ×3 IMPLANT
SHEARS HARMONIC ACE PLUS 36CM (ENDOMECHANICALS) ×3 IMPLANT
SLEEVE XCEL OPT CAN 5 100 (ENDOMECHANICALS) ×6 IMPLANT
SOLUTION ANTI FOG 6CC (MISCELLANEOUS) ×3 IMPLANT
SPONGE LAP 18X18 X RAY DECT (DISPOSABLE) ×3 IMPLANT
STAPLE ECHEON FLEX 60 POW ENDO (STAPLE) ×3 IMPLANT
STAPLER RELOAD BLUE 60MM (STAPLE)
STAPLER RELOAD WHITE 60MM (STAPLE)
STAPLER VISISTAT 35W (STAPLE) ×3 IMPLANT
SUT ETHILON 2 0 PS N (SUTURE) ×3 IMPLANT
TAPE CLOTH 4X10 WHT NS (GAUZE/BANDAGES/DRESSINGS) ×3 IMPLANT
TOWEL OR 17X26 10 PK STRL BLUE (TOWEL DISPOSABLE) ×3 IMPLANT
TRAY FOLEY CATH 14FRSI W/METER (CATHETERS) ×3 IMPLANT
TRAY LAPAROSCOPIC (CUSTOM PROCEDURE TRAY) ×3 IMPLANT
TROCAR BLADELESS 15MM (ENDOMECHANICALS) ×3 IMPLANT
TROCAR BLADELESS OPT 5 100 (ENDOMECHANICALS) ×3 IMPLANT
TROCAR BLADELESS OPT 5 75 (ENDOMECHANICALS) IMPLANT
TROCAR XCEL 12X100 BLDLESS (ENDOMECHANICALS) ×3 IMPLANT
TROCAR XCEL NON-BLD 11X100MML (ENDOMECHANICALS) IMPLANT
TROCAR XCEL UNIV SLVE 11M 100M (ENDOMECHANICALS) IMPLANT
TUBING CONNECTING 10 (TUBING) ×2 IMPLANT
TUBING CONNECTING 10' (TUBING) ×1
TUBING FILTER THERMOFLATOR (ELECTROSURGICAL) ×3 IMPLANT
WATER STERILE IRR 1500ML POUR (IV SOLUTION) ×3 IMPLANT
YANKAUER SUCT BULB TIP 10FT TU (MISCELLANEOUS) ×6 IMPLANT

## 2014-11-08 NOTE — Progress Notes (Signed)
PACU note-----CBC results called to Dr. Dalbert Batman; order rec'd to prepare platelets for later if needed; continue to observe pt

## 2014-11-08 NOTE — Anesthesia Procedure Notes (Signed)
Procedure Name: Intubation Date/Time: 11/08/2014 7:27 AM Performed by: Danley Danker L Patient Re-evaluated:Patient Re-evaluated prior to inductionOxygen Delivery Method: Circle system utilized Preoxygenation: Pre-oxygenation with 100% oxygen Intubation Type: IV induction Ventilation: Mask ventilation without difficulty and Oral airway inserted - appropriate to patient size Laryngoscope Size: Miller and 3 Grade View: Grade I Tube type: Oral Tube size: 8.0 mm Number of attempts: 1 Airway Equipment and Method: Stylet Placement Confirmation: ETT inserted through vocal cords under direct vision,  positive ETCO2 and breath sounds checked- equal and bilateral Secured at: 21 cm Tube secured with: Tape Dental Injury: Teeth and Oropharynx as per pre-operative assessment

## 2014-11-08 NOTE — Op Note (Signed)
Patient Name:           Scott Waters   Date of Surgery:        11/08/2014  Pre op Diagnosis:      Idiopathic thrombocytopenic purpura  Post op Diagnosis:    Same  Procedure:                 Laparoscopic splenectomy  Surgeon:                     Edsel Petrin. Dalbert Batman, M.D., FACS  Assistant:                      Stark Klein, M.D.  Operative Indications:    This is a 58 year old African-American man, referred by Dr. Burney Gauze for consideration of splenectomy for ITP.  Dr. Shanon Ace is his PCP In May of this year he noticed some easy bleeding from his nose and gums. Dr. Regis Bill diagnosed thrombocytopenia and he was admitted to the hospital. Subsequently he has been followed and treated by Dr. [name]. CT scan shows a normal spleen and there are no accessory spleens visible to the radiologist. I have reviewed this with radiology. . He has been on numerous drugs but the only thing that works is [place]. He has received triple vaccines preop. Platelet count on October 28 was 287,000. Platelet count on November 4 dropped to less than 6000. Platelet count on November 11 was up to 102,000. Platelet count on December 11 was 160,000. Despite the infusion of NPLATE  his platelet count this morning is only 15,000. The patient's care was discussed with Dr. [name] and anesthesia and we elected to proceed with splenectomy and we plan to infuse platelets, and if necessary packed red blood cells postop. Comorbidities include hypertension and hyperlipidemia. Marland Kitchen  Operative Findings:       The spleen was of normal size. There was one small nodule on the omentum near the hilum of the spleen that looked like an accessory spleen and we remove that. We did not find any other accessory spleens. Bleeding was a slow, steady ooze consistent with thrombocytopenia. We lost about 500 mL of blood or less. The bleeding seemed to have stopped after the spleen was removed and the platelets were infused. The liver stomach  and colon and small bowel looked normal to inspection.  Procedure in Detail:          Following the induction of general endotracheal anesthesia the patient had a Foley catheter inserted. The patient was positioned on the beanbag with the kidney rest in the right lateral decubitus position and his arm draped across his lower mandible with cushioning. The beanbag was deflated which provided good security and good exposure. The lower chest and abdomen were prepped and draped in a sterile fashion. Surgical timeout was performed. Intravenous antibiotics were given.     0.5% Marcaine with epinephrine was used as a local infiltration anesthetic. A 12 mm Hassan trocar was inserted in the midline just above the umbilicus with an open technique. This was secured with the Purstring suture of 0 Vicryl. Pneumoperitoneum was created. Video camera was inserted. There was no evidence of bleeding or injury to the intestine. We ultimately placed three 5 mm trochars and one 12 mm trocar laterally. Using harmonic scalpel we slowly dissected and mobilized the splenic flexure down away from the lower pole of the spleen. I mobilized the lower pole of the spleen somewhat. I then divided the  gastrosplenic ligament and took that all the way up to the upper pole of the spleen in the fundus of the stomach taking down the short gastric vessels. This allowed the stomach to rotate to the right away from the spleen. We identified  the splenic artery and the body of the pancreas. We slowly took down the soft tissues below the splenic artery and vein. We then  mobilized the spleen from lateral to medial dividing its diaphragmatic attachments. We then had this the spleen swinging freely on its pedicle. We could see the pancreas but because of the excessive fatty tissue we really did not see the exact tip of the pancreas. We placed a 60 mm long echelon stapler with white staple load across the hilum of the spleen. We pushed this all the way up to  the hilum of the spleen and could not push it any further. We then fired the stapler and removed the stapler and we had good hemostasis. We washed things out and things looked good. We infused 2 single donor bags of platelets. We placed the spleen in a specimen bag and pulled it out through the umbilical port. We had to break the spleen up into numerous pieces to get this done but that went well. We then went back and irrigated the operative field.  had fairly good hemostasis. I placed Tisseel on the areas of dissection where  the splenic artery and vein were. I chose to place a 52 [place] Blake drain up into the left subphrenic space and brought that out through the left lateral trocar site and sutured to the skin with nylon suture and connected to suction bulb. We checked around and saw no other abnormalities. The pneumoperitoneum was released and the trochars were removed. The fascia at the umbilicus was closed with 0 Vicryl sutures. Skin incisions were closed with subcuticular sutures of 4-0 Monocryl and Dermabond. The patient tolerated the procedure well and was taken stable to PACU  EBL 500 mL or less. Counts correct. Competitions none. Anesthesia  infused 395 mL of platelets and 2300 mL of crystalloid.     Edsel Petrin. Dalbert Batman, M.D., FACS General and Minimally Invasive Surgery Breast and Colorectal Surgery  11/08/2014 9:51 AM

## 2014-11-08 NOTE — Anesthesia Postprocedure Evaluation (Signed)
  Anesthesia Post-op Note  Patient: Scott Waters  Procedure(s) Performed: Procedure(s) (LRB): LAPAROSCOPIC SPLENECTOMY, POSSIBLE OPEN (N/A)  Patient Location: PACU  Anesthesia Type: General  Level of Consciousness: awake and alert   Airway and Oxygen Therapy: Patient Spontanous Breathing  Post-op Pain: mild  Post-op Assessment: Post-op Vital signs reviewed, Patient's Cardiovascular Status Stable, Respiratory Function Stable, Patent Airway and No signs of Nausea or vomiting  Last Vitals:  Filed Vitals:   11/08/14 1006  BP: 147/80  Pulse: 92  Temp: 36.7 C  Resp: 12    Post-op Vital Signs: stable   Complications: No apparent anesthesia complications

## 2014-11-08 NOTE — Transfer of Care (Signed)
Immediate Anesthesia Transfer of Care Note  Patient: Scott Waters  Procedure(s) Performed: Procedure(s): LAPAROSCOPIC SPLENECTOMY, POSSIBLE OPEN (N/A)  Patient Location: PACU  Anesthesia Type:General  Level of Consciousness: awake and oriented  Airway & Oxygen Therapy: Patient Spontanous Breathing and Patient connected to face mask oxygen  Post-op Assessment: Report given to PACU RN and Post -op Vital signs reviewed and stable  Post vital signs: Reviewed and stable  Complications: No apparent anesthesia complications

## 2014-11-08 NOTE — Interval H&P Note (Signed)
History and Physical Interval Note:  11/08/2014 6:59 AM  Scott Waters  has presented today for surgery, with the diagnosis of Idiopatic thrombocytopenic purpura  The goals and the  various methods of treatment have been discussed with the patient and family. After consideration of risks, benefits and other options for treatment, the patient has consented to  Procedure(s): LAPAROSCOPIC SPLENECTOMY, POSSIBLE OPEN (N/A) as a surgical intervention .  The patient's history has been reviewed, patient examined today, no change in status, stable for surgery.  I have reviewed the patient's chart and labs.  Questions were answered to the patient's satisfaction.     Adin Hector

## 2014-11-09 ENCOUNTER — Ambulatory Visit: Payer: BC Managed Care – PPO

## 2014-11-09 ENCOUNTER — Ambulatory Visit: Payer: BC Managed Care – PPO | Admitting: Hematology & Oncology

## 2014-11-09 ENCOUNTER — Encounter (HOSPITAL_COMMUNITY): Payer: Self-pay | Admitting: General Surgery

## 2014-11-09 ENCOUNTER — Other Ambulatory Visit: Payer: BC Managed Care – PPO | Admitting: Lab

## 2014-11-09 DIAGNOSIS — D693 Immune thrombocytopenic purpura: Principal | ICD-10-CM

## 2014-11-09 LAB — CBC
HCT: 29.9 % — ABNORMAL LOW (ref 39.0–52.0)
Hemoglobin: 9.5 g/dL — ABNORMAL LOW (ref 13.0–17.0)
MCH: 25.7 pg — AB (ref 26.0–34.0)
MCHC: 31.8 g/dL (ref 30.0–36.0)
MCV: 80.8 fL (ref 78.0–100.0)
PLATELETS: 85 10*3/uL — AB (ref 150–400)
RBC: 3.7 MIL/uL — AB (ref 4.22–5.81)
RDW: 16.1 % — ABNORMAL HIGH (ref 11.5–15.5)
WBC: 17.4 10*3/uL — AB (ref 4.0–10.5)

## 2014-11-09 LAB — PREPARE PLATELET PHERESIS
Unit division: 0
Unit division: 0
Unit division: 0
Unit division: 0

## 2014-11-09 LAB — BASIC METABOLIC PANEL
ANION GAP: 12 (ref 5–15)
BUN: 15 mg/dL (ref 6–23)
CHLORIDE: 102 meq/L (ref 96–112)
CO2: 24 mEq/L (ref 19–32)
Calcium: 9 mg/dL (ref 8.4–10.5)
Creatinine, Ser: 0.92 mg/dL (ref 0.50–1.35)
GFR calc Af Amer: >90 mL/min (ref >90)
GFR calc non Af Amer: 90 mL/min — ABNORMAL LOW (ref >90)
Glucose, Bld: 108 mg/dL — ABNORMAL HIGH (ref 70–99)
POTASSIUM: 4.5 meq/L (ref 3.7–5.3)
SODIUM: 138 meq/L (ref 137–147)

## 2014-11-09 NOTE — Consult Note (Signed)
NAMETHAYDEN, Waters NO.:  [id number]  MEDICAL RECORD NO.:  [id number]  LOCATION:  7209                         FACILITY:  [facility]  PHYSICIAN:  [name], M.D.  DATE OF BIRTH:  01-14-1956  DATE OF CONSULTATION:  11/09/2014 DATE OF DISCHARGE:                                CONSULTATION   REASON FOR CONSULTATION:  Immune based thrombocytopenia-status post splenectomy.  HISTORY OF PRESENT ILLNESS:  [name] is a very nice 58 year old, African American gentleman.  He is well known to me.  He presented around May 2014.  He had profound thrombocytopenia.  At first, we thought that he may have had TTP, but studies were all normal for that.  We treated him with IVIG and steroids.  These were very transiently effective.  He then got Rituxan.  This was also transiently effective.  He was given Nplate.  The Nplate was working.  I thought that we might be able to get him to splenectomy and this would help get his platelet count up high enough so that we could hold on the splenectomy.  He has had a couple of bone marrow test done.  Last bone marrow test was done back in October.  Again, there was no bone marrow issue other than the immune-based thrombocytopenia.  We decided to try a splenectomy on him.  He got his vaccines.  He got his last dose of Nplate on the 47SJ.  His platelet count was 160,000.  On the day of surgery, his platelet count was 15,000.  He underwent his splenectomy.  This was done on the 15th.  He got through this pretty well.  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  We do not have any path results back yet.  He did get 2 units of platelets before the procedure.  He had a laparoscopic procedure.  Again, he did find an accessory spleen.  This morning, he is doing pretty well.  His lab work that he got back so far shows a platelet count of 85,000.  Hemoglobin 9.5 and white cell count 17.4.  PAST MEDICAL HISTORY:  His past medical  history is remarkable for: 1. Hypertension. 2. Hyperlipidemia. 3. Gout. 4. GERD.  ALLERGIES:  None.  MEDICATIONS:  His medications at home were: 1. Azor (10/40) 1 p.o. daily. 2. Allegra 180 mg p.o. daily. 3. Flonase nasal spray 2 sprays both nostrils daily. 4. Folic acid 1 mg p.o. daily. 5. Zocor 40 mg p.o. daily.  SOCIAL HISTORY:  Negative for current tobacco use.  He has about a 33- pack year history of tobacco use.  He stopped in 2004.  He does have moderate alcohol use.  FAMILY HISTORY:  His family history is pretty much unremarkable aside of some diabetes, hypertension.  There is a history of colon cancer in his father.  PHYSICAL EXAMINATION:  GENERAL:  This is a well-developed, well- nourished African American gentleman in no obvious distress. VITAL SIGNS:  Showed temperature of 98.6, pulse 79, blood pressure 126/62.  HEAD AND NECK EXAM:  Shows no ocular or oral lesions.  There are no palpable cervical or supraclavicular lymph nodes. LUNGS:  Clear. CARDIAC:  Regular rate  and rhythm with no murmurs, rubs, or bruits. ABDOMEN:  Soft.  He has laparoscopic wounds.  There is a dressing and a drain over on the left side of the abdomen.  Bowel sounds are decreased. There is no palpable hepatomegaly. EXTREMITIES:  Shows no clubbing, cyanosis, or edema. SKIN:  Shows no rashes, ecchymoses, or petechia.  IMPRESSION:  Ms. 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  He has been on multiple lines of therapy.  He now has had a splenectomy.  One would like to hope that having the splenectomy will help with thrombocytopenia.  We will have to see how his platelet counts go throughout the hospital course.  I do not see any other etiology for the thrombocytopenia aside of immune- based thrombocytopenia.  I appreciate Dr. Darrel Hoover help with his splenectomy.  We will, again, follow him along.     [name], M.D.     PRE/MEDQ  D:  11/09/2014   T:  11/09/2014  Job:  [id number]

## 2014-11-09 NOTE — Progress Notes (Signed)
Utilization review completed.  

## 2014-11-09 NOTE — Consult Note (Signed)
#   [id number] is consult note.  Pete  Psalm 61:2

## 2014-11-09 NOTE — Progress Notes (Signed)
1 Day Post-Op  Subjective: Alert. Oriented. No complaints. No problems. Pain control good. No nausea. No respiratory difficulty. Heart rate 79. BP 115/61. SPO2 97% on 2 L nasal cannula. Excellent urine output Drainage is serosanguineous and has slowed down a great deal. Only 35 mL overnight. Platelet count 85,000. Hemoglobin 9.5. Potassium 4.5. Creatinine 0.92.  Objective: Vital signs in last 24 hours: Temp:  [97.9 F (36.6 C)-98.8 F (37.1 C)] 98.6 F (37 C) (12/16 0400) Pulse Rate:  [79-94] 79 (12/16 0400) Resp:  [8-30] 17 (12/16 0400) BP: (114-151)/(61-85) 115/61 mmHg (12/16 0400) SpO2:  [94 %-100 %] 97 % (12/16 0400) Weight:  [214 lb (97.07 kg)-225 lb 1.4 oz (102.1 kg)] 225 lb 1.4 oz (102.1 kg) (12/15 1200) Last BM Date: 11/07/14  Intake/Output from previous day: 12/15 0701 - 12/16 0700 In: 5320 [I.V.:4825; Blood:395; IV Piggyback:100] Out: 3260 [Urine:2490; Drains:270; Blood:500] Intake/Output this shift: Total I/O In: 1175 [I.V.:1125; IV Piggyback:50] Out: 1075 [MWUXL:2440; Drains:35]  General appearance: Alert. Oriented. Cooperative. No distress. Resp: clear to auscultation bilaterally GI: Abdomen soft. Minimally tender. Not distended. Some bowel sounds present. Drainage is thin bloody serosanguineous.  Lab Results:  Results for orders placed or performed during the hospital encounter of 11/08/14 (from the past 24 hour(s))  CBC     Status: Abnormal   Collection Time: 11/08/14  6:35 AM  Result Value Ref Range   WBC 8.6 4.0 - 10.5 K/uL   RBC 4.50 4.22 - 5.81 MIL/uL   Hemoglobin 11.5 (L) 13.0 - 17.0 g/dL   HCT 36.4 (L) 39.0 - 52.0 %   MCV 80.9 78.0 - 100.0 fL   MCH 25.6 (L) 26.0 - 34.0 pg   MCHC 31.6 30.0 - 36.0 g/dL   RDW 16.0 (H) 11.5 - 15.5 %   Platelets 15 (LL) 150 - 400 K/uL  Prepare RBC     Status: None   Collection Time: 11/08/14  9:00 AM  Result Value Ref Range   Order Confirmation ORDER PROCESSED BY BLOOD BANK   CBC     Status: Abnormal   Collection  Time: 11/08/14 10:22 AM  Result Value Ref Range   WBC 25.4 (H) 4.0 - 10.5 K/uL   RBC 3.81 (L) 4.22 - 5.81 MIL/uL   Hemoglobin 9.7 (L) 13.0 - 17.0 g/dL   HCT 31.1 (L) 39.0 - 52.0 %   MCV 81.6 78.0 - 100.0 fL   MCH 25.5 (L) 26.0 - 34.0 pg   MCHC 31.2 30.0 - 36.0 g/dL   RDW 16.0 (H) 11.5 - 15.5 %   Platelets 112 (L) 150 - 400 K/uL  MRSA PCR Screening     Status: None   Collection Time: 11/08/14 12:20 PM  Result Value Ref Range   MRSA by PCR NEGATIVE NEGATIVE  Prepare Pheresed Platelets     Status: None (Preliminary result)   Collection Time: 11/08/14  1:00 PM  Result Value Ref Range   Unit Number [id number]    Blood Component Type PLTPHER LR1    Unit division 00    Status of Unit ALLOCATED    Transfusion Status OK TO TRANSFUSE   CBC     Status: Abnormal   Collection Time: 11/08/14  3:39 PM  Result Value Ref Range   WBC 20.3 (H) 4.0 - 10.5 K/uL   RBC 3.95 (L) 4.22 - 5.81 MIL/uL   Hemoglobin 10.0 (L) 13.0 - 17.0 g/dL   HCT 31.7 (L) 39.0 - 52.0 %   MCV 80.3 78.0 - 100.0 fL  MCH 25.3 (L) 26.0 - 34.0 pg   MCHC 31.5 30.0 - 36.0 g/dL   RDW 15.8 (H) 11.5 - 15.5 %   Platelets 102 (L) 150 - 400 K/uL  CBC     Status: Abnormal   Collection Time: 11/09/14  3:51 AM  Result Value Ref Range   WBC 17.4 (H) 4.0 - 10.5 K/uL   RBC 3.70 (L) 4.22 - 5.81 MIL/uL   Hemoglobin 9.5 (L) 13.0 - 17.0 g/dL   HCT 29.9 (L) 39.0 - 52.0 %   MCV 80.8 78.0 - 100.0 fL   MCH 25.7 (L) 26.0 - 34.0 pg   MCHC 31.8 30.0 - 36.0 g/dL   RDW 16.1 (H) 11.5 - 15.5 %   Platelets 85 (L) 150 - 400 K/uL  Basic metabolic panel     Status: Abnormal   Collection Time: 11/09/14  3:51 AM  Result Value Ref Range   Sodium 138 137 - 147 mEq/L   Potassium 4.5 3.7 - 5.3 mEq/L   Chloride 102 96 - 112 mEq/L   CO2 24 19 - 32 mEq/L   Glucose, Bld 108 (H) 70 - 99 mg/dL   BUN 15 6 - 23 mg/dL   Creatinine, Ser 0.92 0.50 - 1.35 mg/dL   Calcium 9.0 8.4 - 10.5 mg/dL   GFR calc non Af Amer 90 (L) >90 mL/min   GFR calc Af Amer >90  >90 mL/min   Anion gap 12 5 - 15     Studies/Results: No results found.  Marland Kitchen amLODipine  10 mg Oral Daily  . atorvastatin  20 mg Oral QHS  .  ceFAZolin (ANCEF) IV  2 g Intravenous 3 times per day  . folic acid  1 mg Oral Daily  . irbesartan  300 mg Oral Daily  . multivitamin with minerals  1 tablet Oral q morning - 10a  . pantoprazole  40 mg Oral Daily     Assessment/Plan: s/p Procedure(s): LAPAROSCOPIC SPLENECTOMY, POSSIBLE OPEN  POD #1. Laparoscopic splenectomy for ITP. No evidence of postop bleeding Platelet count reasonable. No indication for further platelet transfusion. Discontinue Foley, begin liquid diet, ambulate, transfer to floor. Check CBC tomorrow  Hematologic issues discussed with Dr. [name], who will follow.  Hypertension. Controlled. Continue Norvasc.  @PROBHOSP @  LOS: 1 day    Scott Waters M 11/09/2014  . .prob

## 2014-11-10 LAB — BASIC METABOLIC PANEL
Anion gap: 13 (ref 5–15)
BUN: 12 mg/dL (ref 6–23)
CO2: 26 mEq/L (ref 19–32)
Calcium: 9.3 mg/dL (ref 8.4–10.5)
Chloride: 99 mEq/L (ref 96–112)
Creatinine, Ser: 0.9 mg/dL (ref 0.50–1.35)
GFR calc Af Amer: >90 mL/min (ref >90)
Glucose, Bld: 112 mg/dL — ABNORMAL HIGH (ref 70–99)
POTASSIUM: 3.7 meq/L (ref 3.7–5.3)
SODIUM: 138 meq/L (ref 137–147)

## 2014-11-10 LAB — CBC
HCT: 31.9 % — ABNORMAL LOW (ref 39.0–52.0)
HEMOGLOBIN: 9.9 g/dL — AB (ref 13.0–17.0)
MCH: 25.3 pg — ABNORMAL LOW (ref 26.0–34.0)
MCHC: 31 g/dL (ref 30.0–36.0)
MCV: 81.4 fL (ref 78.0–100.0)
Platelets: 150 10*3/uL (ref 150–400)
RBC: 3.92 MIL/uL — ABNORMAL LOW (ref 4.22–5.81)
RDW: 16.3 % — ABNORMAL HIGH (ref 11.5–15.5)
WBC: 15.2 10*3/uL — AB (ref 4.0–10.5)

## 2014-11-10 LAB — LIPASE, BLOOD
LIPASE: 25 U/L (ref 11–59)
Lipase: 22 U/L (ref 11–59)

## 2014-11-10 LAB — AMYLASE, PERITONEAL FLUID: Amylase, peritoneal fluid: 34 U/L

## 2014-11-10 MED ORDER — POTASSIUM CHLORIDE CRYS ER 20 MEQ PO TBCR
20.0000 meq | EXTENDED_RELEASE_TABLET | Freq: Two times a day (BID) | ORAL | Status: DC
  Administered 2014-11-10 – 2014-11-14 (×9): 20 meq via ORAL
  Filled 2014-11-10 (×10): qty 1

## 2014-11-10 MED ORDER — BISACODYL 10 MG RE SUPP
10.0000 mg | Freq: Two times a day (BID) | RECTAL | Status: AC
  Administered 2014-11-10 – 2014-11-11 (×3): 10 mg via RECTAL
  Filled 2014-11-10 (×3): qty 1

## 2014-11-10 MED ORDER — ALUM & MAG HYDROXIDE-SIMETH 200-200-20 MG/5ML PO SUSP
30.0000 mL | Freq: Four times a day (QID) | ORAL | Status: DC | PRN
  Administered 2014-11-10 – 2014-11-12 (×2): 30 mL via ORAL
  Filled 2014-11-10 (×2): qty 30

## 2014-11-10 NOTE — Progress Notes (Signed)
2 Days Post-Op  Subjective: Stable and alert. Felt bloated and nauseated yesterday but did not vomit. Feels better this morning. Has passed minimal flatus. No stool. Ambulating in hall Voiding uneventfully Afebrile. Vital signs stable Drainage is thinner, low volume. Platelet count back up to 150,000. Hemoglobin 9.9. WBC coming down. Lipase 25. Potassium 3.7.  Objective: Vital signs in last 24 hours: Temp:  [97.9 F (36.6 C)-99 F (37.2 C)] 98.7 F (37.1 C) (12/17 0523) Pulse Rate:  [67-94] 94 (12/17 0523) Resp:  [13-18] 18 (12/17 0523) BP: (126-153)/(62-81) 151/78 mmHg (12/17 0523) SpO2:  [93 %-100 %] 97 % (12/17 0523) Last BM Date: 11/07/14  Intake/Output from previous day: 12/16 0701 - 12/17 0700 In: 3040 [P.O.:1240; I.V.:1800] Out: 1215 [Urine:1150; Drains:65] Intake/Output this shift: Total I/O In: 900 [I.V.:900] Out: 835 [Urine:800; Drains:35]  General appearance: Alert. Pleasant. Oriented. In no distress. Resp: clear to auscultation bilaterally GI: Abdomen soft. Hypoactive bowel sound. Minimally distended. Slight redness of umbilical trocar site. Drainage is thinner, serosanguineous, watery.  Lab Results:  Results for orders placed or performed during the hospital encounter of 11/08/14 (from the past 24 hour(s))  CBC     Status: Abnormal   Collection Time: 11/10/14  5:21 AM  Result Value Ref Range   WBC 15.2 (H) 4.0 - 10.5 K/uL   RBC 3.92 (L) 4.22 - 5.81 MIL/uL   Hemoglobin 9.9 (L) 13.0 - 17.0 g/dL   HCT 31.9 (L) 39.0 - 52.0 %   MCV 81.4 78.0 - 100.0 fL   MCH 25.3 (L) 26.0 - 34.0 pg   MCHC 31.0 30.0 - 36.0 g/dL   RDW 16.3 (H) 11.5 - 15.5 %   Platelets 150 150 - 400 K/uL  Basic metabolic panel     Status: Abnormal   Collection Time: 11/10/14  5:21 AM  Result Value Ref Range   Sodium 138 137 - 147 mEq/L   Potassium 3.7 3.7 - 5.3 mEq/L   Chloride 99 96 - 112 mEq/L   CO2 26 19 - 32 mEq/L   Glucose, Bld 112 (H) 70 - 99 mg/dL   BUN 12 6 - 23 mg/dL   Creatinine, Ser 0.90 0.50 - 1.35 mg/dL   Calcium 9.3 8.4 - 10.5 mg/dL   GFR calc non Af Amer >90 >90 mL/min   GFR calc Af Amer >90 >90 mL/min   Anion gap 13 5 - 15  Lipase, blood     Status: None   Collection Time: 11/10/14  5:21 AM  Result Value Ref Range   Lipase 25 11 - 59 U/L     Studies/Results: No results found.  Marland Kitchen amLODipine  10 mg Oral Daily  . atorvastatin  20 mg Oral QHS  . bisacodyl  10 mg Rectal BID  . folic acid  1 mg Oral Daily  . irbesartan  300 mg Oral Daily  . multivitamin with minerals  1 tablet Oral q morning - 10a  . pantoprazole  40 mg Oral Daily     Assessment/Plan: s/p Procedure(s): LAPAROSCOPIC SPLENECTOMY  POD #2. Laparoscopic splenectomy for ITP. No evidence of postop bleeding  Platelet count reasonable. Somewhat encouraging that it has gone back up. No indication for further platelet transfusion. Ileus, expected, has not resolved. Advance diet slowly. Continue IV support. Dulcolax suppository. Check fluid amylase CBC tomorrow Await pathology. Hope ileus will resolve allowing discharge within 1-2 days.  Slight discoloration umbilical incision. Significance unknown. Observe.  Hematologic issues discussed with Dr. [name], who will follow.  Hypertension. Controlled.  Continue Norvasc   @PROBHOSP @  LOS: 2 days    Scott Waters 11/10/2014  . .prob

## 2014-11-10 NOTE — Progress Notes (Signed)
Mr. Mazzoni looks good this morning. He is out of the ICU. He still has an ileus.  His platelet count is 150,000. Hopefully, this is a good sign that the splenectomy is working.  The pathology on the spleen is negative for any hematologic malignancy.  He is out of bed. He is ambulating. There is no bleeding. He's had no cough. He is using his incentive spirometer.  There is no rashes. He's had no leg swelling. He is on SCDs for DVT control.  He is afebrile. Blood pressure 151/78. There is no change in his physical exam. He still has a drainage catheter in the left side area and hopefully this can be pulled soon.  We will continue to follow along. I appreciate the great care that he is getting from Dr. Dalbert Batman and all the staff on 5 [address].  [name]  Romans 8:28

## 2014-11-11 DIAGNOSIS — D696 Thrombocytopenia, unspecified: Secondary | ICD-10-CM

## 2014-11-11 LAB — CBC
HCT: 32.8 % — ABNORMAL LOW (ref 39.0–52.0)
Hemoglobin: 10.2 g/dL — ABNORMAL LOW (ref 13.0–17.0)
MCH: 25 pg — AB (ref 26.0–34.0)
MCHC: 31.1 g/dL (ref 30.0–36.0)
MCV: 80.4 fL (ref 78.0–100.0)
Platelets: 347 10*3/uL (ref 150–400)
RBC: 4.08 MIL/uL — ABNORMAL LOW (ref 4.22–5.81)
RDW: 16.1 % — ABNORMAL HIGH (ref 11.5–15.5)
WBC: 14.4 10*3/uL — ABNORMAL HIGH (ref 4.0–10.5)

## 2014-11-11 LAB — BASIC METABOLIC PANEL
ANION GAP: 14 (ref 5–15)
BUN: 12 mg/dL (ref 6–23)
CALCIUM: 9 mg/dL (ref 8.4–10.5)
CO2: 28 mEq/L (ref 19–32)
Chloride: 100 mEq/L (ref 96–112)
Creatinine, Ser: 0.89 mg/dL (ref 0.50–1.35)
GFR calc Af Amer: >90 mL/min (ref >90)
GFR calc non Af Amer: >90 mL/min (ref >90)
Glucose, Bld: 115 mg/dL — ABNORMAL HIGH (ref 70–99)
Potassium: 3.9 mEq/L (ref 3.7–5.3)
Sodium: 142 mEq/L (ref 137–147)

## 2014-11-11 LAB — LIPASE, BLOOD: LIPASE: 24 U/L (ref 11–59)

## 2014-11-11 NOTE — Progress Notes (Signed)
Mr. Mccants has a platelet count of 347,000. Hopefully, the splenectomy is now helping.  He still has the ileus. He's would not able to eat yet.  Because of this, he is not able to go home.  He is getting out of bed. He is having no shortness of breath. He's having no fever. He's had no leg swelling.  His vital signs are stable. Blood pressure 140/55. He is afebrile. Lungs are clear. Cardiac exam regular rate and rhythm. Abdomen soft. Slightly distended. He has decreased bowel sounds. There is some tenderness over on the left side. Extremities shows no clubbing cyanosis or edema.  We will continue to follow along. Again, his thrombocytopenia has clearly resolved for right now. Hopefully, we will see continued normalization of his platelet count.  Pete   Col 3:17

## 2014-11-11 NOTE — Progress Notes (Signed)
General Surgery Note  LOS: 3 days  POD -  3 Days Post-Op  Assessment/Plan: 1.  LAPAROSCOPIC SPLENECTOMY -  12/1`03/2014 - H. Dalbert Batman  For ITP - followed by Dr. [name]  Plts now [phone]  Looks good, but not ready to advance diet.  Will leave on clear liquids  2.  Drain in LUQ - Amylase 34 from drain  There is a lot of drainage around the drain.  390 cc recorded in the drain last 24 hours.  2.  HTN 3.  DVT chemo prophylaxis - on hold because of ITP/thrombocytopenia (though this is getting better fast)  Principal Problem:   ITP (idiopathic thrombocytopenic purpura)  Subjective:  Doing okay, but not ready to advance diet.  No flatus, he had very small BM.  But feels okay.  Objective:   Filed Vitals:   11/11/14 0616  BP: 140/55  Pulse: 90  Temp: 98.5 F (36.9 C)  Resp: 18     Intake/Output from previous day:  12/17 0701 - 12/18 0700 In: 2100 [P.O.:600; I.V.:1500] Out: 390 [Drains:390]  Intake/Output this shift:      Physical Exam:   General: [name] who is alert and oriented.    HEENT: Normal. Pupils equal. .   Lungs: Clear.  IS at 2,000 cc   Abdomen: Mild distention.  Rare BS.   Wound: Drain with fluid soaking dressing.  Nurses changing the dressing several times per day.     Lab Results:    Recent Labs  11/10/14 0521 11/11/14 0512  WBC 15.2* 14.4*  HGB 9.9* 10.2*  HCT 31.9* 32.8*  PLT 150 347    BMET   Recent Labs  11/10/14 0521 11/11/14 0512  NA 138 142  K 3.7 3.9  CL 99 100  CO2 26 28  GLUCOSE 112* 115*  BUN 12 12  CREATININE 0.90 0.89  CALCIUM 9.3 9.0    PT/INR  No results for input(s): LABPROT, INR in the last 72 hours.  ABG  No results for input(s): PHART, HCO3 in the last 72 hours.  Invalid input(s): PCO2, PO2   Studies/Results:  No results found.   Anti-infectives:   Anti-infectives    Start     Dose/Rate Route Frequency Ordered Stop   11/08/14 1400  ceFAZolin (ANCEF) IVPB 2 g/50 mL premix     2 g100 mL/hr over 30 Minutes  Intravenous 3 times per day 11/08/14 1238 11/09/14 0629   11/08/14 0545  ceFAZolin (ANCEF) IVPB 2 g/50 mL premix  Status:  Discontinued     2 g100 mL/hr over 30 Minutes Intravenous On call to O.R. 11/08/14 0545 11/08/14 0550   11/08/14 0545  ceFAZolin (ANCEF) IVPB 2 g/50 mL premix     2 g100 mL/hr over 30 Minutes Intravenous On call to O.R. 11/08/14 0545 11/08/14 0750      Alphonsa Overall, MD, FACS Pager: [facility] Office: [phone] 11/11/2014

## 2014-11-12 LAB — CBC WITH DIFFERENTIAL/PLATELET
BASOS ABS: 0 10*3/uL (ref 0.0–0.1)
BASOS PCT: 0 % (ref 0–1)
Eosinophils Absolute: 0.1 10*3/uL (ref 0.0–0.7)
Eosinophils Relative: 1 % (ref 0–5)
HCT: 33.2 % — ABNORMAL LOW (ref 39.0–52.0)
Hemoglobin: 10.3 g/dL — ABNORMAL LOW (ref 13.0–17.0)
Lymphocytes Relative: 7 % — ABNORMAL LOW (ref 12–46)
Lymphs Abs: 1.1 10*3/uL (ref 0.7–4.0)
MCH: 24.8 pg — ABNORMAL LOW (ref 26.0–34.0)
MCHC: 31 g/dL (ref 30.0–36.0)
MCV: 80 fL (ref 78.0–100.0)
MONO ABS: 1.4 10*3/uL — AB (ref 0.1–1.0)
Monocytes Relative: 9 % (ref 3–12)
NEUTROS ABS: 12.8 10*3/uL — AB (ref 1.7–7.7)
Neutrophils Relative %: 83 % — ABNORMAL HIGH (ref 43–77)
Platelets: 657 10*3/uL — ABNORMAL HIGH (ref 150–400)
RBC: 4.15 MIL/uL — ABNORMAL LOW (ref 4.22–5.81)
RDW: 16.1 % — AB (ref 11.5–15.5)
WBC: 15.4 10*3/uL — ABNORMAL HIGH (ref 4.0–10.5)

## 2014-11-12 LAB — TYPE AND SCREEN
ABO/RH(D): A POS
ANTIBODY SCREEN: NEGATIVE
Unit division: 0
Unit division: 0

## 2014-11-12 NOTE — Progress Notes (Signed)
General Surgery Note  LOS: 4 days  POD -  4 Days Post-Op  Assessment/Plan: 1.  LAPAROSCOPIC SPLENECTOMY -  11/08/2014 - H. Dalbert Batman  For ITP - followed by Dr. [name]  Plt count increasing  Vomited a large amount this am.  Will switch back to NPO  2.  Drain in LUQ - Amylase 34 from drain  There is a lot of drainage around the drain.  125cc recorded in the drain last 24 hours.  2.  HTN 3.  DVT chemo prophylaxis - on hold because of ITP/thrombocytopenia (though this is getting better fast)  Principal Problem:   ITP (idiopathic thrombocytopenic purpura)  Subjective:  Doing okay, but not ready to advance diet.  No flatus and vomited this am.  feels okay.  Objective:   Filed Vitals:   11/12/14 0511  BP: 144/76  Pulse: 88  Temp: 98.4 F (36.9 Scott Waters)  Resp: 18     Intake/Output from previous day:  12/18 0701 - 12/19 0700 In: 1320 [P.O.:120; I.V.:1200] Out: 125 [Drains:125]  Intake/Output this shift:      Physical Exam:   General: [name] who is alert and oriented.    HEENT: Normal. Pupils equal. .   Lungs: Clear.  IS at 2,000 cc   Abdomen: Mild distention.  Rare BS.   Wound: clean     Lab Results:     Recent Labs  11/11/14 0512 11/12/14 0537  WBC 14.4* 15.4*  HGB 10.2* 10.3*  HCT 32.8* 33.2*  PLT 347 657*    BMET    Recent Labs  11/10/14 0521 11/11/14 0512  NA 138 142  K 3.7 3.9  CL 99 100  CO2 26 28  GLUCOSE 112* 115*  BUN 12 12  CREATININE 0.90 0.89  CALCIUM 9.3 9.0    PT/INR  No results for input(s): LABPROT, INR in the last 72 hours.  ABG  No results for input(s): PHART, HCO3 in the last 72 hours.  Invalid input(s): PCO2, PO2   Studies/Results:  No results found.   Anti-infectives:   Anti-infectives    Start     Dose/Rate Route Frequency Ordered Stop   11/08/14 1400  ceFAZolin (ANCEF) IVPB 2 g/50 mL premix     2 g100 mL/hr over 30 Minutes Intravenous 3 times per day 11/08/14 1238 11/09/14 0629   11/08/14 0545  ceFAZolin (ANCEF) IVPB 2  g/50 mL premix  Status:  Discontinued     2 g100 mL/hr over 30 Minutes Intravenous On call to O.R. 11/08/14 0545 11/08/14 0550   11/08/14 0545  ceFAZolin (ANCEF) IVPB 2 g/50 mL premix     2 g100 mL/hr over 30 Minutes Intravenous On call to O.R. 11/08/14 0545 62/69/48 [id number]      Scott Waters Waters Scott Waters Dasani [name], MD  Colorectal and General Surgery [facility]   11/12/2014

## 2014-11-13 MED ORDER — ENOXAPARIN SODIUM 40 MG/0.4ML ~~LOC~~ SOLN
40.0000 mg | SUBCUTANEOUS | Status: DC
  Administered 2014-11-13 – 2014-11-14 (×2): 40 mg via SUBCUTANEOUS
  Filled 2014-11-13 (×2): qty 0.4

## 2014-11-13 NOTE — Progress Notes (Signed)
General Surgery Note  LOS: 5 days  POD -  5 Days Post-Op  Assessment/Plan: 1.  LAPAROSCOPIC SPLENECTOMY -  11/08/2014 - H. Dalbert Batman  For ITP - followed by Dr. [name]  Plt count increasing: recheck in AM  Will try clears again later today if patient continues to feel better  2.  Drain in LUQ - Amylase 34 from drain    11cc recorded in the drain last 24 hours.  2.  HTN 3.  DVT chemo prophylaxis - restart  Principal Problem:   ITP (idiopathic thrombocytopenic purpura)  Subjective:  Doing okay, but not ready to advance diet this morning.  Having small amounts of flatus and small BM's with suppositories.   Objective:   Filed Vitals:   11/13/14 0550  BP: 127/77  Pulse: 81  Temp: 98.2 F (36.8 C)  Resp: 18     Intake/Output from previous day:  12/19 0701 - 12/20 0700 In: 1792.3 [I.V.:1791.3] Out: 511 [Urine:500; Drains:11]  Intake/Output this shift:      Physical Exam:   General: [name] who is alert and oriented.    HEENT: Normal. Pupils equal. .   Lungs: Clear.     Abdomen: Mild distention.    Wound: clean   Drain: SS fluid     Lab Results:     Recent Labs  11/11/14 0512 11/12/14 0537  WBC 14.4* 15.4*  HGB 10.2* 10.3*  HCT 32.8* 33.2*  PLT 347 657*    BMET    Recent Labs  11/11/14 0512  NA 142  K 3.9  CL 100  CO2 28  GLUCOSE 115*  BUN 12  CREATININE 0.89  CALCIUM 9.0    PT/INR  No results for input(s): LABPROT, INR in the last 72 hours.  ABG  No results for input(s): PHART, HCO3 in the last 72 hours.  Invalid input(s): PCO2, PO2   Studies/Results:  No results found.   Anti-infectives:   Anti-infectives    Start     Dose/Rate Route Frequency Ordered Stop   11/08/14 1400  ceFAZolin (ANCEF) IVPB 2 g/50 mL premix     2 g100 mL/hr over 30 Minutes Intravenous 3 times per day 11/08/14 1238 11/09/14 0629   11/08/14 0545  ceFAZolin (ANCEF) IVPB 2 g/50 mL premix  Status:  Discontinued     2 g100 mL/hr over 30 Minutes Intravenous On call to  O.R. 11/08/14 0545 11/08/14 0550   11/08/14 0545  ceFAZolin (ANCEF) IVPB 2 g/50 mL premix     2 g100 mL/hr over 30 Minutes Intravenous On call to O.R. 11/08/14 0545 32/95/18 [id number]      Scott Glock C Addy Mcmannis, MD  Colorectal and General Surgery [facility]   11/13/2014

## 2014-11-14 ENCOUNTER — Inpatient Hospital Stay (HOSPITAL_COMMUNITY): Payer: BC Managed Care – PPO

## 2014-11-14 ENCOUNTER — Other Ambulatory Visit (HOSPITAL_COMMUNITY): Payer: BC Managed Care – PPO

## 2014-11-14 ENCOUNTER — Inpatient Hospital Stay (HOSPITAL_COMMUNITY): Payer: BC Managed Care – PPO | Admitting: Anesthesiology

## 2014-11-14 ENCOUNTER — Encounter (HOSPITAL_COMMUNITY)
Admission: RE | Disposition: A | Discharge status: Home / Self Care | Payer: Self-pay | Source: Ambulatory Visit | Attending: General Surgery

## 2014-11-14 ENCOUNTER — Encounter (HOSPITAL_COMMUNITY): Payer: Self-pay | Admitting: Radiology

## 2014-11-14 DIAGNOSIS — R7989 Other specified abnormal findings of blood chemistry: Secondary | ICD-10-CM

## 2014-11-14 HISTORY — PX: LAPAROTOMY: SHX154

## 2014-11-14 LAB — BASIC METABOLIC PANEL
ANION GAP: 15 (ref 5–15)
BUN: 23 mg/dL (ref 6–23)
CALCIUM: 9.4 mg/dL (ref 8.4–10.5)
CO2: 24 meq/L (ref 19–32)
Chloride: 102 mEq/L (ref 96–112)
Creatinine, Ser: 0.98 mg/dL (ref 0.50–1.35)
GFR calc non Af Amer: 88 mL/min — ABNORMAL LOW (ref >90)
Glucose, Bld: 89 mg/dL (ref 70–99)
Potassium: 4.3 mEq/L (ref 3.7–5.3)
SODIUM: 141 meq/L (ref 137–147)

## 2014-11-14 LAB — CBC
HCT: 33.8 % — ABNORMAL LOW (ref 39.0–52.0)
HCT: 33.2 % — ABNORMAL LOW (ref 39.0–52.0)
HEMOGLOBIN: 10.2 g/dL — AB (ref 13.0–17.0)
HEMOGLOBIN: 10 g/dL — AB (ref 13.0–17.0)
MCH: 24.8 pg — ABNORMAL LOW (ref 26.0–34.0)
MCH: 24.6 pg — ABNORMAL LOW (ref 26.0–34.0)
MCHC: 30.2 g/dL (ref 30.0–36.0)
MCHC: 30.1 g/dL (ref 30.0–36.0)
MCV: 82 fL (ref 78.0–100.0)
MCV: 81.6 fL (ref 78.0–100.0)
PLATELETS: 1028 10*3/uL — AB (ref 150–400)
PLATELETS: 1012 10*3/uL — AB (ref 150–400)
RBC: 4.12 MIL/uL — ABNORMAL LOW (ref 4.22–5.81)
RBC: 4.07 MIL/uL — ABNORMAL LOW (ref 4.22–5.81)
RDW: 16.3 % — ABNORMAL HIGH (ref 11.5–15.5)
RDW: 16.2 % — ABNORMAL HIGH (ref 11.5–15.5)
WBC: 13.1 10*3/uL — ABNORMAL HIGH (ref 4.0–10.5)
WBC: 20 10*3/uL — ABNORMAL HIGH (ref 4.0–10.5)

## 2014-11-14 LAB — CREATININE, SERUM
Creatinine, Ser: 1.02 mg/dL (ref 0.50–1.35)
GFR calc Af Amer: >90 mL/min (ref >90)
GFR calc non Af Amer: 78 mL/min — ABNORMAL LOW (ref >90)

## 2014-11-14 LAB — PATHOLOGIST SMEAR REVIEW

## 2014-11-14 LAB — LIPASE, BLOOD: LIPASE: 516 U/L — AB (ref 11–59)

## 2014-11-14 SURGERY — LAPAROTOMY, EXPLORATORY
Anesthesia: General

## 2014-11-14 MED ORDER — CEFAZOLIN SODIUM-DEXTROSE 2-3 GM-% IV SOLR
INTRAVENOUS | Status: AC
  Filled 2014-11-14: qty 50

## 2014-11-14 MED ORDER — SUCCINYLCHOLINE CHLORIDE 20 MG/ML IJ SOLN
INTRAMUSCULAR | Status: DC | PRN
  Administered 2014-11-14: 100 mg via INTRAVENOUS

## 2014-11-14 MED ORDER — ONDANSETRON HCL 4 MG/2ML IJ SOLN
INTRAMUSCULAR | Status: DC | PRN
Start: 2014-11-14 — End: 2014-11-14
  Administered 2014-11-14: 4 mg via INTRAVENOUS

## 2014-11-14 MED ORDER — PANTOPRAZOLE SODIUM 40 MG IV SOLR
40.0000 mg | Freq: Two times a day (BID) | INTRAVENOUS | Status: DC
  Administered 2014-11-14 – 2014-11-17 (×8): 40 mg via INTRAVENOUS
  Filled 2014-11-14 (×10): qty 40

## 2014-11-14 MED ORDER — IOHEXOL 300 MG/ML  SOLN
50.0000 mL | Freq: Once | INTRAMUSCULAR | Status: AC | PRN
  Administered 2014-11-14: 50 mL via ORAL

## 2014-11-14 MED ORDER — DEXAMETHASONE SODIUM PHOSPHATE 10 MG/ML IJ SOLN
INTRAMUSCULAR | Status: DC | PRN
  Administered 2014-11-14: 10 mg via INTRAVENOUS

## 2014-11-14 MED ORDER — SUFENTANIL CITRATE 50 MCG/ML IV SOLN
INTRAVENOUS | Status: DC | PRN
  Administered 2014-11-14: 20 ug via INTRAVENOUS
  Administered 2014-11-14: 10 ug via INTRAVENOUS

## 2014-11-14 MED ORDER — CISATRACURIUM BESYLATE 20 MG/10ML IV SOLN
INTRAVENOUS | Status: AC
Start: 2014-11-14 — End: 2014-11-14
  Filled 2014-11-14: qty 10

## 2014-11-14 MED ORDER — LACTATED RINGERS IV SOLN
INTRAVENOUS | Status: DC | PRN
  Administered 2014-11-14 (×2): via INTRAVENOUS

## 2014-11-14 MED ORDER — ONDANSETRON HCL 4 MG/2ML IJ SOLN
INTRAMUSCULAR | Status: AC
  Filled 2014-11-14: qty 2

## 2014-11-14 MED ORDER — ONDANSETRON HCL 4 MG/2ML IJ SOLN: 4.0000 mg | Freq: Four times a day (QID) | INTRAMUSCULAR | Status: DC | PRN

## 2014-11-14 MED ORDER — SODIUM CHLORIDE 0.9 % IV SOLN
INTRAVENOUS | Status: DC
  Filled 2014-11-14 (×3): qty 1000

## 2014-11-14 MED ORDER — PROMETHAZINE HCL 25 MG/ML IJ SOLN: 6.2500 mg | INTRAMUSCULAR | Status: DC | PRN

## 2014-11-14 MED ORDER — HYDROMORPHONE HCL 1 MG/ML IJ SOLN
INTRAMUSCULAR | Status: AC
  Filled 2014-11-14: qty 1

## 2014-11-14 MED ORDER — IOHEXOL 300 MG/ML  SOLN
100.0000 mL | Freq: Once | INTRAMUSCULAR | Status: AC | PRN
  Administered 2014-11-14: 100 mL via INTRAVENOUS

## 2014-11-14 MED ORDER — POTASSIUM CHLORIDE IN NACL 20-0.9 MEQ/L-% IV SOLN
INTRAVENOUS | Status: DC
  Administered 2014-11-14 – 2014-11-17 (×7): via INTRAVENOUS
  Administered 2014-11-18: 75 mL/h via INTRAVENOUS
  Filled 2014-11-14 (×10): qty 1000

## 2014-11-14 MED ORDER — HYDROMORPHONE HCL 1 MG/ML IJ SOLN
1.0000 mg | INTRAMUSCULAR | Status: DC | PRN
  Administered 2014-11-14 – 2014-11-18 (×12): 1 mg via INTRAVENOUS
  Filled 2014-11-14 (×12): qty 1

## 2014-11-14 MED ORDER — CEFAZOLIN SODIUM-DEXTROSE 2-3 GM-% IV SOLR
2.0000 g | INTRAVENOUS | Status: AC
  Administered 2014-11-14: 2 g via INTRAVENOUS

## 2014-11-14 MED ORDER — PROPOFOL 10 MG/ML IV BOLUS
INTRAVENOUS | Status: DC | PRN
  Administered 2014-11-14: 170 mg via INTRAVENOUS

## 2014-11-14 MED ORDER — CHLORHEXIDINE GLUCONATE 0.12 % MT SOLN
15.0000 mL | Freq: Four times a day (QID) | OROMUCOSAL | Status: DC
  Administered 2014-11-15 – 2014-11-18 (×12): 15 mL via OROMUCOSAL
  Filled 2014-11-14 (×17): qty 15

## 2014-11-14 MED ORDER — SUFENTANIL CITRATE 50 MCG/ML IV SOLN
INTRAVENOUS | Status: AC
  Filled 2014-11-14: qty 1

## 2014-11-14 MED ORDER — GLYCOPYRROLATE 0.2 MG/ML IJ SOLN
INTRAMUSCULAR | Status: DC | PRN
  Administered 2014-11-14: .6 mg via INTRAVENOUS

## 2014-11-14 MED ORDER — LIDOCAINE HCL (CARDIAC) 20 MG/ML IV SOLN
INTRAVENOUS | Status: AC
  Filled 2014-11-14: qty 5

## 2014-11-14 MED ORDER — LIP MEDEX EX OINT
TOPICAL_OINTMENT | CUTANEOUS | Status: DC | PRN
  Administered 2014-11-15: via TOPICAL
  Filled 2014-11-14: qty 7

## 2014-11-14 MED ORDER — LIDOCAINE HCL (CARDIAC) 20 MG/ML IV SOLN
INTRAVENOUS | Status: DC | PRN
  Administered 2014-11-14: 100 mg via INTRAVENOUS

## 2014-11-14 MED ORDER — HYDROMORPHONE HCL 1 MG/ML IJ SOLN
0.2500 mg | INTRAMUSCULAR | Status: DC | PRN
  Administered 2014-11-14: 0.5 mg via INTRAVENOUS
  Administered 2014-11-14: 0.25 mg via INTRAVENOUS
  Administered 2014-11-14 (×2): 0.5 mg via INTRAVENOUS
  Administered 2014-11-14: 0.25 mg via INTRAVENOUS

## 2014-11-14 MED ORDER — KETOROLAC TROMETHAMINE 15 MG/ML IJ SOLN
15.0000 mg | Freq: Four times a day (QID) | INTRAMUSCULAR | Status: DC | PRN
  Administered 2014-11-14: 15 mg via INTRAVENOUS
  Filled 2014-11-14: qty 1

## 2014-11-14 MED ORDER — METOCLOPRAMIDE HCL 5 MG/ML IJ SOLN
5.0000 mg | Freq: Four times a day (QID) | INTRAMUSCULAR | Status: DC
  Administered 2014-11-14 (×2): 5 mg via INTRAVENOUS
  Filled 2014-11-14 (×2): qty 2

## 2014-11-14 MED ORDER — METOPROLOL TARTRATE 1 MG/ML IV SOLN
5.0000 mg | Freq: Four times a day (QID) | INTRAVENOUS | Status: DC
  Administered 2014-11-14 – 2014-11-18 (×14): 5 mg via INTRAVENOUS
  Filled 2014-11-14 (×18): qty 5

## 2014-11-14 MED ORDER — LACTATED RINGERS IV SOLN: INTRAVENOUS | Status: DC

## 2014-11-14 MED ORDER — ENOXAPARIN SODIUM 40 MG/0.4ML ~~LOC~~ SOLN
40.0000 mg | SUBCUTANEOUS | Status: DC
  Administered 2014-11-15 – 2014-11-18 (×4): 40 mg via SUBCUTANEOUS
  Filled 2014-11-14 (×4): qty 0.4

## 2014-11-14 MED ORDER — TISSEEL VH 10 ML EX KIT
PACK | CUTANEOUS | Status: AC
  Filled 2014-11-14: qty 1

## 2014-11-14 MED ORDER — NEOSTIGMINE METHYLSULFATE 10 MG/10ML IV SOLN
INTRAVENOUS | Status: DC | PRN
  Administered 2014-11-14: 4 mg via INTRAVENOUS

## 2014-11-14 MED ORDER — ONDANSETRON HCL 4 MG PO TABS: 4.0000 mg | ORAL_TABLET | Freq: Four times a day (QID) | ORAL | Status: DC | PRN

## 2014-11-14 MED ORDER — CISATRACURIUM BESYLATE (PF) 10 MG/5ML IV SOLN
INTRAVENOUS | Status: DC | PRN
  Administered 2014-11-14: 6 mg via INTRAVENOUS
  Administered 2014-11-14: 1 mg via INTRAVENOUS

## 2014-11-14 MED ORDER — SODIUM CHLORIDE 0.9 % IJ SOLN
INTRAMUSCULAR | Status: AC
  Filled 2014-11-14: qty 10

## 2014-11-14 MED ORDER — SODIUM CHLORIDE 0.9 % IR SOLN
Status: DC | PRN
  Administered 2014-11-14: 2000 mL

## 2014-11-14 MED ORDER — PROPOFOL 10 MG/ML IV BOLUS
INTRAVENOUS | Status: AC
  Filled 2014-11-14: qty 20

## 2014-11-14 SURGICAL SUPPLY — 37 items
APPLICATOR COTTON TIP 6IN STRL (MISCELLANEOUS) ×3 IMPLANT
BLADE EXTENDED COATED 6.5IN (ELECTRODE) ×3 IMPLANT
BLADE HEX COATED 2.75 (ELECTRODE) ×3 IMPLANT
COVER MAYO STAND STRL (DRAPES) IMPLANT
DRAPE LAPAROSCOPIC ABDOMINAL (DRAPES) ×3 IMPLANT
DRAPE WARM FLUID 44X44 (DRAPE) ×3 IMPLANT
ELECT REM PT RETURN 9FT ADLT (ELECTROSURGICAL) ×3
ELECTRODE REM PT RTRN 9FT ADLT (ELECTROSURGICAL) ×1 IMPLANT
GAUZE SPONGE 4X4 12PLY STRL (GAUZE/BANDAGES/DRESSINGS) ×3 IMPLANT
GLOVE BIOGEL PI IND STRL 7.0 (GLOVE) ×1 IMPLANT
GLOVE BIOGEL PI INDICATOR 7.0 (GLOVE) ×2
GLOVE EUDERMIC 7 POWDERFREE (GLOVE) ×3 IMPLANT
GOWN STRL REUS W/TWL LRG LVL3 (GOWN DISPOSABLE) ×3 IMPLANT
GOWN STRL REUS W/TWL XL LVL3 (GOWN DISPOSABLE) ×6 IMPLANT
KIT BASIN OR (CUSTOM PROCEDURE TRAY) ×3 IMPLANT
NS IRRIG 1000ML POUR BTL (IV SOLUTION) ×3 IMPLANT
PACK GENERAL/GYN (CUSTOM PROCEDURE TRAY) ×3 IMPLANT
SPONGE LAP 18X18 X RAY DECT (DISPOSABLE) ×3 IMPLANT
STAPLER VISISTAT 35W (STAPLE) ×3 IMPLANT
SUCTION POOLE TIP (SUCTIONS) ×3 IMPLANT
SUT NOVA NAB DX-16 0-1 5-0 T12 (SUTURE) ×3 IMPLANT
SUT NOVA NAB GS-21 1 T12 (SUTURE) ×3 IMPLANT
SUT PDS AB 1 CTX 36 (SUTURE) IMPLANT
SUT PDS AB 1 TP1 96 (SUTURE) ×6 IMPLANT
SUT SILK 2 0 (SUTURE) ×2
SUT SILK 2 0 SH CR/8 (SUTURE) ×3 IMPLANT
SUT SILK 2-0 18XBRD TIE 12 (SUTURE) ×1 IMPLANT
SUT SILK 3 0 (SUTURE)
SUT SILK 3 0 SH CR/8 (SUTURE) ×3 IMPLANT
SUT SILK 3-0 18XBRD TIE 12 (SUTURE) IMPLANT
SUT VICRYL 2 0 18  UND BR (SUTURE)
SUT VICRYL 2 0 18 UND BR (SUTURE) IMPLANT
TAPE CLOTH SOFT 2X10 (GAUZE/BANDAGES/DRESSINGS) ×3 IMPLANT
TOWEL OR 17X26 10 PK STRL BLUE (TOWEL DISPOSABLE) ×3 IMPLANT
TRAY FOLEY CATH 14FRSI W/METER (CATHETERS) IMPLANT
TRAY FOLEY CATH 16FRSI W/METER (SET/KITS/TRAYS/PACK) ×3 IMPLANT
YANKAUER SUCT BULB TIP NO VENT (SUCTIONS) ×3 IMPLANT

## 2014-11-14 NOTE — Progress Notes (Signed)
CRITICAL VALUE ALERT  Critical value received:  Platelets: 1028  Date of notification:  11/14/2014  Time of notification:  0503  Critical value read back:Yes.    Nurse who received alert:  Raynald Blend, RN  MD notified (1st page):  A. Marcello Moores, MD  Time of first page:  0510  MD notified (2nd page):  Time of second page:  Responding MD:  A. Marcello Moores, MD  Time MD responded:  [phone]

## 2014-11-14 NOTE — Transfer of Care (Signed)
Immediate Anesthesia Transfer of Care Note  Patient: Scott Waters  Procedure(s) Performed: Procedure(s): EXPLORATORY LAPAROTOMY, RELEASE SMALL BOWEL OBSTRUCTION, INCISIONAL HERNIA REPAIR (N/A)  Patient Location: PACU  Anesthesia Type:General  Level of Consciousness: awake and alert   Airway & Oxygen Therapy: Patient Spontanous Breathing and Patient connected to face mask oxygen  Post-op Assessment: Report given to PACU RN and Post -op Vital signs reviewed and stable  Post vital signs: Reviewed and stable  Complications: No apparent anesthesia complications

## 2014-11-14 NOTE — Anesthesia Procedure Notes (Addendum)
Procedure Name: Intubation Performed by: Danley Danker L Patient Re-evaluated:Patient Re-evaluated prior to inductionOxygen Delivery Method: Circle system utilized Preoxygenation: Pre-oxygenation with 100% oxygen Intubation Type: IV induction, Rapid sequence and Cricoid Pressure applied Laryngoscope Size: Miller and 3 Grade View: Grade I Tube type: Oral Tube size: 8.0 mm Number of attempts: 2 Airway Equipment and Method: Stylet Placement Confirmation: ETT inserted through vocal cords under direct vision,  breath sounds checked- equal and bilateral and positive ETCO2 Secured at: 22 cm Tube secured with: Tape Dental Injury: Teeth and Oropharynx as per pre-operative assessment  Comments: Rapid sequence with cricoid pressure.  With laryngoscopy, noted small amount of bilious green drainage in posterior oral pharynx.  Suctioned and performed 2nd laryngoscopy with intubation of trachea.  Aspiration of stomach contents was minimal, <1cc.  Post induction a NG was placed and 1500 cc stomach contents removed.  ETT suctioned out by Dr. Eliseo Squires.  No drainage noted.

## 2014-11-14 NOTE — Anesthesia Postprocedure Evaluation (Addendum)
  Anesthesia Post-op Note  Patient: Scott Waters  Procedure(s) Performed: Procedure(s) (LRB): EXPLORATORY LAPAROTOMY, RELEASE SMALL BOWEL OBSTRUCTION, INCISIONAL HERNIA REPAIR (N/A)  Patient Location: PACU  Anesthesia Type: General  Level of Consciousness: awake and alert   Airway and Oxygen Therapy: Patient Spontanous Breathing  Post-op Pain: mild  Post-op Assessment: Post-op Vital signs reviewed, Patient's Cardiovascular Status Stable, Respiratory Function Stable, Patent Airway and No signs of Nausea or vomiting  Last Vitals:  Filed Vitals:   11/14/14 1945  BP: 135/77  Pulse: 90  Temp:   Resp: 16    Post-op Vital Signs: stable   Complications: No apparent anesthesia complications. Denies dyspnea. Oxygen saturations 92% on Oxygen 2L/min [place].

## 2014-11-14 NOTE — Progress Notes (Signed)
Gen. surgery:  Lab work shows WBC 13,100. Lipase, is now 516. Next line abdominal x-rays suggested ileus   CT scan shows small bowel obstruction from incarcerated hernia just above the umbilicus. This is almost certainly his trocar site. The pancreas looks normal. The splenic bed is clean there is no abscess or pancreatitis.  Exam reveals that he is in no distress or pain. Abdomen is soft and nontender. There is some fullness in the periumbilical area where his trocar site is.  I explained that he had a bowel obstruction and I recommended that we take him back to the operating room this afternoon for open repair of his incarcerated incisional hernia. He is in full agreement. I discussed the indications, details, techniques, and numerous risk of the surgery with him. He is aware the risk of bleeding, infection, injury to the bowel, bowel resection, recurrent hernia, cardiac pulmonary and thromboembolic problems. He understands these issues well. At this time all of his questions are answered. He agrees with this plan.    Edsel Petrin. Dalbert Batman, M.D., [facility] General and Minimally invasive Surgery Breast and Colorectal Surgery Office:   [phone] Pager:   [phone]

## 2014-11-14 NOTE — Progress Notes (Signed)
Scott Waters still having problems with ileus. I think he is going down for CT scan today.  His platelets are over many now. Clearly, the splenectomy has had a good effect on his thrombocytopenia. Once he is able to eat, then I would proceed with aspirin on him. Currently, he is on Lovenox.  He's been afebrile. He's had no process of blood pressure. His blood pressure is 136/72. His abdomen is somewhat distended. Bowel sounds are decreased. There is no guarding or rebound tenderness. He has the laparotomy scar that is healing. There is no palpable hepatomegaly. Extremities shows no clubbing, cyanosis or edema.  We will continue to follow along. Again, he has thrombocytosis post splenectomy.  Ramond Dial 2:11

## 2014-11-14 NOTE — Progress Notes (Addendum)
6 Days Post-Op  Subjective: Alert and stable. Not much pain. Minimal flatus. Minimal stool.  states he vomited a small amount of bilious fluid last night. States his abdomen still isn't working.  ambulating in halls. No respiratory difficulty or chest pain. Platelet count 1028. W BC 13,100. Now on Lovenox JP drainage minimal. Serosanguineous. Amylase content low.  Objective: Vital signs in last 24 hours: Temp:  [98.5 F (36.9 C)-98.6 F (37 C)] 98.5 F (36.9 C) (12/20 2140) Pulse Rate:  [78-79] 79 (12/20 2140) Resp:  [18] 18 (12/20 2140) BP: (128-154)/(69-78) 128/69 mmHg (12/20 2140) SpO2:  [96 %-97 %] 97 % (12/20 2140) Last BM Date: 11/11/14  Intake/Output from previous day: 12/20 0701 - 12/21 0700 In: 1568.8 [P.O.:60; I.V.:1508.8] Out: 1105 [Urine:1100; Drains:5] Intake/Output this shift: Total I/O In: 600 [I.V.:600] Out: 155 [Urine:150; Drains:5]  General appearance: Alert. Pleasant. Cooperative. Does not appear to be in any physical distress. Resp: Clear to auscultation bilaterally. Especially left base sounds clear. GI: Abdomen is soft. Not really tender. Borderline distended. Hypoactive bowel sounds. Wounds look good. JP drainage thin, serosanguineous.  Lab Results:  Results for orders placed or performed during the hospital encounter of 11/08/14 (from the past 24 hour(s))  CBC     Status: Abnormal   Collection Time: 11/14/14  4:23 AM  Result Value Ref Range   WBC 13.1 (H) 4.0 - 10.5 K/uL   RBC 4.12 (L) 4.22 - 5.81 MIL/uL   Hemoglobin 10.2 (L) 13.0 - 17.0 g/dL   HCT 33.8 (L) 39.0 - 52.0 %   MCV 82.0 78.0 - 100.0 fL   MCH 24.8 (L) 26.0 - 34.0 pg   MCHC 30.2 30.0 - 36.0 g/dL   RDW 16.3 (H) 11.5 - 15.5 %   Platelets 1028 (HH) 150 - 400 K/uL     Studies/Results: No results found.  Marland Kitchen amLODipine  10 mg Oral Daily  . atorvastatin  20 mg Oral QHS  . enoxaparin (LOVENOX) injection  40 mg Subcutaneous Q24H  . folic acid  1 mg Oral Daily  . irbesartan  300 mg Oral  Daily  . metoCLOPramide (REGLAN) injection  5 mg Intravenous 4 times per day  . multivitamin with minerals  1 tablet Oral q morning - 10a  . pantoprazole (PROTONIX) IV  40 mg Intravenous Q12H  . potassium chloride  20 mEq Oral BID     Assessment/Plan: s/p Procedure(s): LAPAROSCOPIC SPLENECTOMY  POD #6. Laparoscopic splenectomy for ITP. Clinically has ileus. This has not resolved as would be expected. We'll check abdominal films and electrolytes today Add reglan Discontinue narcotic and substitute toradol  consider CT scan if fails to improve  Significant thrombocytosis. On Lovenox Question whether he should be on aspirin. Defer to Dr. [name].  Hypertension. Controlled.  @PROBHOSP @  LOS: 6 days    Scott Waters M 11/14/2014  . .prob

## 2014-11-14 NOTE — Op Note (Signed)
Patient Name:           Scott Waters   Date of Surgery:        11/14/2014  Pre op Diagnosis:      Acute small bowel obstruction secondary to incarcerated incisional hernia  Post op Diagnosis:    Same  Procedure:                 Wound exploration, laparotomy, release of small bowel obstruction, primary repair of incisional hernia  Surgeon:                     Edsel Petrin. Dalbert Batman, M.D., FACS  Assistant:                      OR staff  Operative Indications:   This is a 58 year old gentleman with idiopathic thrombocytopenic purpura. He underwent elective laparoscopic splenectomy on 11/08/2014. He has had an ileus ever since surgery but has otherwise done well without any pain or respiratory problems. His drainage has slowed down. Platelet count has gone up to over 1000. Because his ileus was prolonged lab work today was performed which showed a lipase of 516. I was concerned about left upper quadrant abscess. CT scan showed a normal-appearing pancreas and a normal appearing left upper quadrant, but he had an acute small bowel obstruction secondary to a loop of small bowel caught in his periumbilical incision. Although he was not tender he is brought to the operating room urgently for exploration and repair.  Operative Findings:       There was an acute mechanical small bowel obstruction from a loop of small bowel caught in his supraumbilical trocar incision. The fascia had separated. The bowel was viable. It was dilated proximally and collapsed distally. There was no ischemia or inflammatory change. I was able to milk small bowel content from proximal to distal. I chose to simply drop the small bowel in, bring the omentum down and then do a careful repair with interrupted #1 Novafil sutures.  Procedure in Detail:          Following the induction of general endotracheal anesthesia a Foley catheter was inserted. A nasogastric tube was inserted draining 1500 mL of fluid. The abdomen was prepped and draped  in a sterile fashion. Intravenous antibiotics were given. Surgical timeout was performed.   I made a midline incision somewhat above and a little bit below the supraumbilical trocar site incision. I encountered the small bowel in the subdermal area and slowly dissected around this without any injury. I slowly enlarged incision above and below. There was some inflammatory adhesions in the hernia sac which I slowly took down mostly bluntly. I eventually could get my finger under the fascia and opened it up above and below a little bit and  the bowel became completely free. I ran the small bowel for several inches proximally and distally and found no areas that need to be resected. The bowel was dropped back in. The omentum was pulled down. I checked the fascia circumferentially. I made sure there were no other defects. I undermined the subcutaneous tissue in a few areas. I closed the fascia with multiple interrupted sutures of #1 Novafil. This provided very secure repair. There was a space in the wound where the hernia sac was, so I a left Penrose drain in that and then closed the skin with skin staples. Clean bandages were placed and the patient taken to PACU in stable condition. EBL  30 mL. Counts correct. Complications none.       Edsel Petrin. Dalbert Batman, M.D., FACS General and Minimally Invasive Surgery Breast and Colorectal Surgery  11/14/2014 6:30 PM

## 2014-11-14 NOTE — Anesthesia Preprocedure Evaluation (Addendum)
Anesthesia Evaluation  Patient identified by MRN, date of birth, ID band Patient awake    Reviewed: Allergy & Precautions, H&P , NPO status , Patient's Chart, lab work & pertinent test results  Airway Mallampati: II  TM Distance: >3 FB Neck ROM: Full    Dental no notable dental hx.    Pulmonary former smoker,  breath sounds clear to auscultation  Pulmonary exam normal       Cardiovascular Exercise Tolerance: Good hypertension, Pt. on medications Rhythm:Regular Rate:Normal     Neuro/Psych PSYCHIATRIC DISORDERS Anxiety Depression negative neurological ROS     GI/Hepatic Neg liver ROS, GERD-  Medicated,  Endo/Other  negative endocrine ROS  Renal/GU negative Renal ROS  negative genitourinary   Musculoskeletal  (+) Arthritis -,   Abdominal (+) + obese,   Peds negative pediatric ROS (+)  Hematology negative hematology ROS (+)   Anesthesia Other Findings   Reproductive/Obstetrics negative OB ROS                            Anesthesia Physical Anesthesia Plan  ASA: II  Anesthesia Plan: General   Post-op Pain Management:    Induction: Intravenous  Airway Management Planned: Oral ETT  Additional Equipment:   Intra-op Plan:   Post-operative Plan: Extubation in OR  Informed Consent: I have reviewed the patients History and Physical, chart, labs and discussed the procedure including the risks, benefits and alternatives for the proposed anesthesia with the patient or authorized representative who has indicated his/her understanding and acceptance.   Dental advisory given  Plan Discussed with: CRNA  Anesthesia Plan Comments:         Anesthesia Quick Evaluation

## 2014-11-15 ENCOUNTER — Encounter (HOSPITAL_COMMUNITY): Payer: Self-pay | Admitting: General Surgery

## 2014-11-15 LAB — CBC
HCT: 30.9 % — ABNORMAL LOW (ref 39.0–52.0)
Hemoglobin: 9.5 g/dL — ABNORMAL LOW (ref 13.0–17.0)
MCH: 25.2 pg — AB (ref 26.0–34.0)
MCHC: 30.7 g/dL (ref 30.0–36.0)
MCV: 82 fL (ref 78.0–100.0)
PLATELETS: 978 10*3/uL — AB (ref 150–400)
RBC: 3.77 MIL/uL — ABNORMAL LOW (ref 4.22–5.81)
RDW: 16.3 % — ABNORMAL HIGH (ref 11.5–15.5)
WBC: 35.1 10*3/uL — ABNORMAL HIGH (ref 4.0–10.5)

## 2014-11-15 LAB — COMPREHENSIVE METABOLIC PANEL
ALT: 17 U/L (ref 0–53)
AST: 22 U/L (ref 0–37)
Albumin: 3.2 g/dL — ABNORMAL LOW (ref 3.5–5.2)
Alkaline Phosphatase: 69 U/L (ref 39–117)
Anion gap: 11 (ref 5–15)
BUN: 22 mg/dL (ref 6–23)
CO2: 22 mmol/L (ref 19–32)
CREATININE: 1.09 mg/dL (ref 0.50–1.35)
Calcium: 8.1 mg/dL — ABNORMAL LOW (ref 8.4–10.5)
Chloride: 108 mEq/L (ref 96–112)
GFR calc Af Amer: 83 mL/min — ABNORMAL LOW (ref >90)
GFR, EST NON AFRICAN AMERICAN: 72 mL/min — AB (ref >90)
GLUCOSE: 96 mg/dL (ref 70–99)
Potassium: 4.2 mmol/L (ref 3.5–5.1)
SODIUM: 141 mmol/L (ref 135–145)
Total Bilirubin: 0.5 mg/dL (ref 0.3–1.2)
Total Protein: 6 g/dL (ref 6.0–8.3)

## 2014-11-15 LAB — AMYLASE, PERITONEAL FLUID: Amylase, peritoneal fluid: 134 U/L

## 2014-11-15 NOTE — Progress Notes (Signed)
1 Day Post-Op  Subjective: Alert and stable. States abdomen feels much better. Less pain less bloating. I explained the operative events to him. BP 134/71. On every 6 hours Lopressor for BP control now. Afebrile. Heart rate 98.  Hemoglobin 9.5. Platelet count 978,000. WBC 35,000.  Objective: Vital signs in last 24 hours: Temp:  [98.3 F (36.8 C)-99.8 F (37.7 C)] 99.8 F (37.7 C) (12/22 0519) Pulse Rate:  [85-103] 98 (12/22 0519) Resp:  [16-25] 18 (12/21 2211) BP: (131-160)/(71-85) 134/71 mmHg (12/22 0519) SpO2:  [91 %-100 %] 91 % (12/22 0519) Last BM Date: 11/11/14  Intake/Output from previous day: 12/21 0701 - 12/22 0700 In: 3105.8 [I.V.:3105.8] Out: 945 [Urine:650; Emesis/NG output:220; Blood:75] Intake/Output this shift: Total I/O In: 970.8 [I.V.:970.8] Out: 820 [Urine:600; Emesis/NG output:220]  General appearance: Alert. Cooperative. Appropriate. Does not appear to be in any distress. Resp: clear to auscultation bilaterally GI: Softer. Less distended. Rare bowel sounds. Wounds clean. Penrose drain in place with expected serosanguineous drainage.  Lab Results:  Results for orders placed or performed during the hospital encounter of 11/08/14 (from the past 24 hour(s))  Basic metabolic panel     Status: Abnormal   Collection Time: 11/14/14  6:55 AM  Result Value Ref Range   Sodium 141 137 - 147 mEq/L   Potassium 4.3 3.7 - 5.3 mEq/L   Chloride 102 96 - 112 mEq/L   CO2 24 19 - 32 mEq/L   Glucose, Bld 89 70 - 99 mg/dL   BUN 23 6 - 23 mg/dL   Creatinine, Ser 0.98 0.50 - 1.35 mg/dL   Calcium 9.4 8.4 - 10.5 mg/dL   GFR calc non Af Amer 88 (L) >90 mL/min   GFR calc Af Amer >90 >90 mL/min   Anion gap 15 5 - 15  Lipase, blood     Status: Abnormal   Collection Time: 11/14/14  6:55 AM  Result Value Ref Range   Lipase 516 (H) 11 - 59 U/L  Amylase, Peritoneal Fluid     Status: None   Collection Time: 11/14/14  1:33 PM  Result Value Ref Range   Amylase, peritoneal fluid  134 U/L  CBC     Status: Abnormal   Collection Time: 11/14/14  9:40 PM  Result Value Ref Range   WBC 20.0 (H) 4.0 - 10.5 K/uL   RBC 4.07 (L) 4.22 - 5.81 MIL/uL   Hemoglobin 10.0 (L) 13.0 - 17.0 g/dL   HCT 33.2 (L) 39.0 - 52.0 %   MCV 81.6 78.0 - 100.0 fL   MCH 24.6 (L) 26.0 - 34.0 pg   MCHC 30.1 30.0 - 36.0 g/dL   RDW 16.2 (H) 11.5 - 15.5 %   Platelets 1012 (HH) 150 - 400 K/uL  Creatinine, serum     Status: Abnormal   Collection Time: 11/14/14  9:40 PM  Result Value Ref Range   Creatinine, Ser 1.02 0.50 - 1.35 mg/dL   GFR calc non Af Amer 78 (L) >90 mL/min   GFR calc Af Amer >90 >90 mL/min  CBC     Status: Abnormal   Collection Time: 11/15/14  5:13 AM  Result Value Ref Range   WBC 35.1 (H) 4.0 - 10.5 K/uL   RBC 3.77 (L) 4.22 - 5.81 MIL/uL   Hemoglobin 9.5 (L) 13.0 - 17.0 g/dL   HCT 30.9 (L) 39.0 - 52.0 %   MCV 82.0 78.0 - 100.0 fL   MCH 25.2 (L) 26.0 - 34.0 pg   MCHC 30.7 30.0 -  36.0 g/dL   RDW 16.3 (H) 11.5 - 15.5 %   Platelets 978 (HH) 150 - 400 K/uL     Studies/Results: Ct Abdomen Pelvis W Contrast  11/14/2014   ADDENDUM REPORT: 11/14/2014 15:57  ADDENDUM: These results were called by telephone at the time of interpretation on 11/14/2014 at 3:57 pm to Dr. Fanny Skates , who verbally acknowledged these results.   Electronically Signed   By: Lowella Grip M.D.   On: 11/14/2014 15:57   11/14/2014   CLINICAL DATA:  Pain following laparoscopic splenectomy. Elevated lipase. Nausea, vomiting, and constipation.  EXAM: CT ABDOMEN AND PELVIS WITH CONTRAST  TECHNIQUE: Multidetector CT imaging of the abdomen and pelvis was performed using the standard protocol following bolus administration of intravenous contrast. Oral contrast was also administered.  CONTRAST:  32mL OMNIPAQUE IOHEXOL 300 MG/ML SOLN, 127mL OMNIPAQUE IOHEXOL 300 MG/ML SOLN  COMPARISON:  Apr 21, 2014  FINDINGS: There is posterior left base atelectasis. Lung bases are otherwise clear.  No focal liver lesions are  identified. Gallbladder wall is not thickened. There is no biliary duct dilatation.  The spleen is absent with a surgical drain in the posterior left upper quadrant. Mild inflammatory type change in this area is consistent with recent surgery. There is no evidence of abscess or abnormal fluid collection in the posterior left upper quadrant in the area of recent splenectomy.  Pancreas appears normal. Left adrenal appears normal. There is a right adrenal myelolipoma containing moderate fat measuring 2.5 x 1 2.1 cm.  Kidneys bilaterally show no mass or hydronephrosis on either side. There is no renal or ureteral calculus on either side.  In the pelvis, the urinary bladder is midline with normal wall thickness. There is a small amount of fat in each inguinal ring. There is no pelvic mass or fluid collection. There are sigmoid diverticula without diverticulitis.  There is a ventral hernia which contains fat and small bowel. The small bowel which is present in the hernia is not dilated. There is evidence of bowel obstruction with a transition zone just proximal to this hernia. There is no free air or portal venous air.  There is no appreciable ascites or abscess. There is no adenopathy in the abdomen or pelvis. There is atherosclerotic change in the aorta but no aneurysm. There are no blastic or lytic bone lesions. There is marked degenerative change in the lumbar spine at L5-S1 with 8 mm of anterolisthesis of L5 on S1. 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  IMPRESSION: Small bowel obstruction with the transition zone just proximal to a ventral hernia which contains fat and small bowel.  Status post splenectomy with a drain in this area but no abscess or significant inflammation.  Right adrenal myelolipoma, containing moderate fat.  Sigmoid diverticulosis without diverticulitis.  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  Electronically Signed: By:  Lowella Grip M.D. On: 11/14/2014 15:37   Dg Abd 2 Views  11/14/2014   CLINICAL DATA:  Constipation.  Surgery 6 days ago.  EXAM: ABDOMEN - 2 VIEW  COMPARISON:  CT 04/21/2014.  FINDINGS: Surgical drainage left upper quadrant. Prominent dilated loops of small bowel. There is a paucity of intra colonic gas noted. Although these findings could be related to postoperative adynamic ileus, he has findings suspicious for small-bowel obstruction. Follow-up abdominal series suggest to demonstrate resolution. No free air. Pelvic phleboliths. Degenerative changes thoracolumbar spine.  IMPRESSION: 1. Surgical drainage tube left upper quadrant.  2. Prominent dilated loops of small bowel with a paucity of intra colonic air. Although these findings could be related to postoperative adynamic ileus small-bowel obstruction cannot be excluded and close follow-up abdominal series suggested to demonstrate resolution .   Electronically Signed   By: Marcello Moores  Register   On: 11/14/2014 09:50    . chlorhexidine  15 mL Mouth/Throat QID  . enoxaparin (LOVENOX) injection  40 mg Subcutaneous Q24H  . HYDROmorphone      . HYDROmorphone      . metoprolol  5 mg Intravenous 4 times per day  . pantoprazole (PROTONIX) IV  40 mg Intravenous Q12H     Assessment/Plan: s/p Procedure(s): EXPLORATORY LAPAROTOMY, RELEASE SMALL BOWEL OBSTRUCTION, INCISIONAL HERNIA REPAIR  POD #1. Wound exploration, laparotomy for release of SBO and repair incisional hernia Stable Continue NG Remove Foley Ambulate  POD #7. Laparoscopic splenectomy for ITP. CT scan yesterday showed normal-appearing pancreas and left upper quadrant, so drain removed  Thrombocytosis. Resume Lovenox today. Switch to aspirin when diet resumed  Leukocytosis. Probably physiologic response in face of recent splenectomy. No evidence of infection or ischemia.  @PROBHOSP @  LOS: 7 days    Levone Otten M 11/15/2014  . .prob

## 2014-11-16 LAB — BASIC METABOLIC PANEL
ANION GAP: 7 (ref 5–15)
BUN: 15 mg/dL (ref 6–23)
CO2: 24 mmol/L (ref 19–32)
Calcium: 8.1 mg/dL — ABNORMAL LOW (ref 8.4–10.5)
Chloride: 105 mEq/L (ref 96–112)
Creatinine, Ser: 0.94 mg/dL (ref 0.50–1.35)
GFR calc non Af Amer: 89 mL/min — ABNORMAL LOW (ref >90)
Glucose, Bld: 78 mg/dL (ref 70–99)
POTASSIUM: 3.8 mmol/L (ref 3.5–5.1)
SODIUM: 136 mmol/L (ref 135–145)

## 2014-11-16 LAB — CBC
HCT: 29.9 % — ABNORMAL LOW (ref 39.0–52.0)
Hemoglobin: 9.1 g/dL — ABNORMAL LOW (ref 13.0–17.0)
MCH: 25.1 pg — ABNORMAL LOW (ref 26.0–34.0)
MCHC: 30.4 g/dL (ref 30.0–36.0)
MCV: 82.4 fL (ref 78.0–100.0)
PLATELETS: 917 10*3/uL — AB (ref 150–400)
RBC: 3.63 MIL/uL — ABNORMAL LOW (ref 4.22–5.81)
RDW: 16.4 % — AB (ref 11.5–15.5)
WBC: 22.6 10*3/uL — ABNORMAL HIGH (ref 4.0–10.5)

## 2014-11-16 MED ORDER — BISACODYL 10 MG RE SUPP
10.0000 mg | Freq: Two times a day (BID) | RECTAL | Status: AC
  Administered 2014-11-16: 10 mg via RECTAL
  Filled 2014-11-16: qty 1

## 2014-11-16 NOTE — Progress Notes (Addendum)
2 Days Post-Op  Subjective: Feels better. Denies nausea or respiratory problems. Feels rumbling sounds in his abdomen but really hasn't had a bowel movement or passed any significant flatus yet. Voiding well but notes high urine output. Temp 100.9. BP 126/70. NG drainage recorded as 150 mL yesterday. Looks bilious. Potassium 3.8. Creatinine 0.94. WBC down to 22,000. Platelet count 917,000.  Objective: Vital signs in last 24 hours: Temp:  [97.6 F (36.4 C)-100.9 F (38.3 C)] 100.9 F (38.3 C) (12/23 0556) Pulse Rate:  [87-90] 87 (12/23 0210) Resp:  [18-20] 18 (12/23 0556) BP: (126-132)/(61-73) 126/70 mmHg (12/23 0556) SpO2:  [94 %-96 %] 96 % (12/23 0556) Last BM Date: 11/11/14  Intake/Output from previous day: 12/22 0701 - 12/23 0700 In: 1501 [I.V.:1500] Out: 4015 [Urine:3865; Emesis/NG output:150] Intake/Output this shift: Total I/O In: 0  Out: 2190 [Urine:2190]  General appearance: Alert. Pleasant. No distress. Mental status normal. Resp: clear to auscultation bilaterally GI: Soft. Minimally tender. Minimally distended. Hypoactive bowel sounds. Wounds clean. Penrose drain in place.  Lab Results:  Results for orders placed or performed during the hospital encounter of 11/08/14 (from the past 24 hour(s))  CBC     Status: Abnormal   Collection Time: 11/16/14  4:25 AM  Result Value Ref Range   WBC 22.6 (H) 4.0 - 10.5 K/uL   RBC 3.63 (L) 4.22 - 5.81 MIL/uL   Hemoglobin 9.1 (L) 13.0 - 17.0 g/dL   HCT 29.9 (L) 39.0 - 52.0 %   MCV 82.4 78.0 - 100.0 fL   MCH 25.1 (L) 26.0 - 34.0 pg   MCHC 30.4 30.0 - 36.0 g/dL   RDW 16.4 (H) 11.5 - 15.5 %   Platelets 917 (HH) 150 - 400 K/uL  Basic metabolic panel     Status: Abnormal   Collection Time: 11/16/14  4:25 AM  Result Value Ref Range   Sodium 136 135 - 145 mmol/L   Potassium 3.8 3.5 - 5.1 mmol/L   Chloride 105 96 - 112 mEq/L   CO2 24 19 - 32 mmol/L   Glucose, Bld 78 70 - 99 mg/dL   BUN 15 6 - 23 mg/dL   Creatinine, Ser 0.94  0.50 - 1.35 mg/dL   Calcium 8.1 (L) 8.4 - 10.5 mg/dL   GFR calc non Af Amer 89 (L) >90 mL/min   GFR calc Af Amer >90 >90 mL/min   Anion gap 7 5 - 15     Studies/Results: No results found.  . chlorhexidine  15 mL Mouth/Throat QID  . enoxaparin (LOVENOX) injection  40 mg Subcutaneous Q24H  . metoprolol  5 mg Intravenous 4 times per day  . pantoprazole (PROTONIX) IV  40 mg Intravenous Q12H     Assessment/Plan: s/p Procedure(s): EXPLORATORY LAPAROTOMY, RELEASE SMALL BOWEL OBSTRUCTION, INCISIONAL HERNIA REPAIR  POD #2. Wound exploration, laparotomy for release of SBO and repair incisional hernia Stable Continue NG. Hopefully he will open up and allow  NG removal tomorrow. Penrose drain in the subcutaneous tissue, should be removed in one to 2 days. Ambulate He is aware that my partners will round on him for the next few days.  Low-grade fever. Suspect atelectasis. Intensify activity.  POD #8. Laparoscopic splenectomy for ITP. CT scan 11/14/2014 showed normal-appearing pancreas and left upper quadrant, so drain removed  Polyuria. IV rate was decreased.  Thrombocytosis. Resume Lovenox. Switch to aspirin 325 mg./day  when diet resumed. Followed by Dr. Burney Gauze..  Leukocytosis. Probably physiologic response in face of recent splenectomy. No evidence  of infection or ischemia.   @PROBHOSP @  LOS: 8 days    Scott Waters M 11/16/2014  . .prob

## 2014-11-17 LAB — CBC
HCT: 29.1 % — ABNORMAL LOW (ref 39.0–52.0)
Hemoglobin: 8.8 g/dL — ABNORMAL LOW (ref 13.0–17.0)
MCH: 24.1 pg — ABNORMAL LOW (ref 26.0–34.0)
MCHC: 30.2 g/dL (ref 30.0–36.0)
MCV: 79.7 fL (ref 78.0–100.0)
Platelets: 664 10*3/uL — ABNORMAL HIGH (ref 150–400)
RBC: 3.65 MIL/uL — ABNORMAL LOW (ref 4.22–5.81)
RDW: 16.2 % — ABNORMAL HIGH (ref 11.5–15.5)
WBC: 17.4 10*3/uL — ABNORMAL HIGH (ref 4.0–10.5)

## 2014-11-17 MED ORDER — BISACODYL 10 MG RE SUPP: 10.0000 mg | Freq: Two times a day (BID) | RECTAL | Status: DC | PRN

## 2014-11-17 MED ORDER — SACCHAROMYCES BOULARDII 250 MG PO CAPS
250.0000 mg | ORAL_CAPSULE | Freq: Two times a day (BID) | ORAL | Status: DC
  Administered 2014-11-17 – 2014-11-18 (×3): 250 mg via ORAL
  Filled 2014-11-17 (×4): qty 1

## 2014-11-17 MED ORDER — MENTHOL 3 MG MT LOZG
1.0000 | LOZENGE | OROMUCOSAL | Status: DC | PRN
  Filled 2014-11-17: qty 9

## 2014-11-17 MED ORDER — ACETAMINOPHEN 325 MG PO TABS: 325.0000 mg | ORAL_TABLET | Freq: Four times a day (QID) | ORAL | Status: DC | PRN

## 2014-11-17 MED ORDER — PHENOL 1.4 % MT LIQD
2.0000 | OROMUCOSAL | Status: DC | PRN
  Filled 2014-11-17: qty 177

## 2014-11-17 MED ORDER — ALUM & MAG HYDROXIDE-SIMETH 200-200-20 MG/5ML PO SUSP
30.0000 mL | Freq: Four times a day (QID) | ORAL | Status: DC | PRN
Start: 2014-11-17 — End: 2014-11-18

## 2014-11-17 MED ORDER — MAGIC MOUTHWASH
15.0000 mL | Freq: Four times a day (QID) | ORAL | Status: DC | PRN
  Administered 2014-11-17 (×2): 15 mL via ORAL
  Filled 2014-11-17 (×3): qty 15

## 2014-11-17 MED ORDER — LIP MEDEX EX OINT
1.0000 "application " | TOPICAL_OINTMENT | Freq: Two times a day (BID) | CUTANEOUS | Status: DC
  Administered 2014-11-17 (×2): 1 via TOPICAL
  Filled 2014-11-17: qty 7

## 2014-11-17 MED ORDER — LACTATED RINGERS IV BOLUS (SEPSIS): 1000.0000 mL | Freq: Three times a day (TID) | INTRAVENOUS | Status: DC | PRN

## 2014-11-17 NOTE — Progress Notes (Signed)
[facility]  [address] [place], [place] [address] Phone: [phone] FAX: [phone]    Scott Waters [id number] 1956/02/21  CARE TEAM:  PCP: [name], MD  Outpatient Care Team: Patient Care Team: Burnis Medin, MD as PCP - General Irene Shipper, MD as Attending Physician (Gastroenterology) [name], MD as Consulting Physician (Oncology)  Inpatient Treatment Team: Treatment Team: Attending Provider: Fanny Skates, MD; Consulting Physician: [name], MD; Registered Nurse: Rise Patience, RN; Consulting Physician: Nolon Nations, MD; Registered Nurse: July Charlynn Grimes, RN; Registered Nurse: Emeterio Reeve, RN   Subjective:  Feeling better NGT out Walking No nausea  Objective:  Vital signs:  Filed Vitals:   11/16/14 1400 11/16/14 2200 11/16/14 2314 11/17/14 0510  BP: 143/59 131/69 142/70 138/73  Pulse: 91 87 85 89  Temp: 99.5 F (37.5 C) 98.9 F (37.2 C)  100.2 F (37.9 C)  TempSrc: Oral Oral  Oral  Resp: 18 18  18   Height:      Weight:      SpO2: 93% 93%  92%    Last BM Date: 11/16/14  Intake/Output   Yesterday:  12/23 0701 - 12/24 0700 In: 3340.2 [I.V.:3339.2] Out: 2525 [Urine:2525] This shift:  Total I/O In: -  Out: 250 [Urine:250]  Bowel function:  Flatus: YES  BM: small  Drain: n/a  Physical Exam:  General: Pt awake/alert/oriented x4 in no acute distress Eyes: PERRL, normal EOM.  Sclera clear.  No icterus Neuro: CN II-XII intact w/o focal sensory/motor deficits. Lymph: No head/neck/groin lymphadenopathy Psych:  No delerium/psychosis/paranoia HENT: Normocephalic, Mucus membranes moist.  No thrush Neck: Supple, No tracheal deviation Chest: No chest wall pain w good excursion CV:  Pulses intact.  Regular rhythm MS: Normal AROM mjr joints.  No obvious deformity Abdomen: Soft.  Nondistended.  Mildly tender at incisions only.  Penrose in wound.  No evidence of  peritonitis.  No incarcerated hernias. Ext:  SCDs BLE.  No mjr edema.  No cyanosis Skin: No petechiae / purpura   Problem List:   Principal Problem:   ITP (idiopathic thrombocytopenic purpura) Active Problems:   Small bowel obstruction   SBO (small bowel obstruction)   Assessment  Lenor Derrick  58 y.o. male  3 Days Post-Op  Procedure(s): EXPLORATORY LAPAROTOMY, RELEASE SMALL BOWEL OBSTRUCTION, INCISIONAL HERNIA REPAIR  Recovering s/p repair of incarcerated dehiscence   Plan:  -Thin liquids.  Adv diet gradually -remove penrose in AM -leukocytosis resolving -thrombocytosis resolving - if >1000K, then ASA.  Hold for now -VTE prophylaxis- SCDs, etc -mobilize as tolerated to help recovery  Adin Hector, M.D., F.A.C.S. Gastrointestinal and Minimally Invasive Surgery [facility] [address] [place], [place] [address] [phone] Main / Paging   11/17/2014   Results:   Labs: Results for orders placed or performed during the hospital encounter of 11/08/14 (from the past 48 hour(s))  CBC     Status: Abnormal   Collection Time: 11/16/14  4:25 AM  Result Value Ref Range   WBC 22.6 (H) 4.0 - 10.5 K/uL   RBC 3.63 (L) 4.22 - 5.81 MIL/uL   Hemoglobin 9.1 (L) 13.0 - 17.0 g/dL   HCT 29.9 (L) 39.0 - 52.0 %   MCV 82.4 78.0 - 100.0 fL   MCH 25.1 (L) 26.0 - 34.0 pg   MCHC 30.4 30.0 - 36.0 g/dL   RDW 16.4 (H) 11.5 - 15.5 %   Platelets 917 (  HH) 150 - 400 K/uL    Comment: CRITICAL VALUE NOTED.  VALUE IS CONSISTENT WITH PREVIOUSLY REPORTED AND CALLED VALUE.  Basic metabolic panel     Status: Abnormal   Collection Time: 11/16/14  4:25 AM  Result Value Ref Range   Sodium 136 135 - 145 mmol/L    Comment: Please note change in reference range.   Potassium 3.8 3.5 - 5.1 mmol/L    Comment: Please note change in reference range.   Chloride 105 96 - 112 mEq/L   CO2 24 19 - 32 mmol/L   Glucose, Bld 78 70 - 99 mg/dL   BUN 15 6 - 23 mg/dL    Creatinine, Ser 0.94 0.50 - 1.35 mg/dL   Calcium 8.1 (L) 8.4 - 10.5 mg/dL   GFR calc non Af Amer 89 (L) >90 mL/min   GFR calc Af Amer >90 >90 mL/min    Comment: (NOTE) The eGFR has been calculated using the CKD EPI equation. This calculation has not been validated in all clinical situations. eGFR's persistently <90 mL/min signify possible Chronic Kidney Disease.    Anion gap 7 5 - 15  CBC     Status: Abnormal   Collection Time: 11/17/14 12:07 AM  Result Value Ref Range   WBC 17.4 (H) 4.0 - 10.5 K/uL   RBC 3.65 (L) 4.22 - 5.81 MIL/uL   Hemoglobin 8.8 (L) 13.0 - 17.0 g/dL   HCT 29.1 (L) 39.0 - 52.0 %   MCV 79.7 78.0 - 100.0 fL   MCH 24.1 (L) 26.0 - 34.0 pg   MCHC 30.2 30.0 - 36.0 g/dL   RDW 16.2 (H) 11.5 - 15.5 %   Platelets 664 (H) 150 - 400 K/uL    Comment: DELTA CHECK NOTED REPEATED TO VERIFY SPECIMEN CHECKED FOR CLOTS     Imaging / Studies: No results found.  Medications / Allergies: per chart  Antibiotics: Anti-infectives    Start     Dose/Rate Route Frequency Ordered Stop   11/15/14 0600  ceFAZolin (ANCEF) IVPB 2 g/50 mL premix     2 g100 mL/hr over 30 Minutes Intravenous On call to O.R. 11/14/14 1707 11/14/14 1740   11/08/14 1400  ceFAZolin (ANCEF) IVPB 2 g/50 mL premix     2 g100 mL/hr over 30 Minutes Intravenous 3 times per day 11/08/14 1238 11/09/14 0629   11/08/14 0545  ceFAZolin (ANCEF) IVPB 2 g/50 mL premix  Status:  Discontinued     2 g100 mL/hr over 30 Minutes Intravenous On call to O.R. 11/08/14 0545 11/08/14 0550   11/08/14 0545  ceFAZolin (ANCEF) IVPB 2 g/50 mL premix     2 g100 mL/hr over 30 Minutes Intravenous On call to O.R. 11/08/14 0545 11/08/14 0750       Note: Portions of this report may have been transcribed using voice recognition software. Every effort was made to ensure accuracy; however, inadvertent computerized transcription errors may be present.   Any transcriptional errors that result from this process are  unintentional.

## 2014-11-18 MED ORDER — HYDROCODONE-ACETAMINOPHEN 5-325 MG PO TABS: 1.0000 | ORAL_TABLET | Freq: Four times a day (QID) | ORAL | Status: DC | PRN

## 2014-11-18 NOTE — Discharge Summary (Signed)
Reviewed discharge teaching with pt including follow-up appointments, medications, and precautions. Pt verbalized understanding of all topics discussed without questions.

## 2014-11-18 NOTE — Progress Notes (Signed)
General Surgery Note  LOS: 10 days  POD -  4 Days Post-Op  Assessment/Plan: 1.  EXPLORATORY LAPAROTOMY, RELEASE SMALL BOWEL OBSTRUCTION, INCISIONAL HERNIA REPAIR - 11/14/2014 -  Scott Waters  Tolerating full liquids and passing flatus.  Looks good and wants to go home.  Reviewed discharge instructions.  2. LAPAROSCOPIC SPLENECTOMY - 11/08/2014 - Scott Waters For ITP - followed by Dr. [name] Plts - [phone] - 11/17/2014 3. HTN 4. DVT - Lovenox 5.  Anemia - Hgb 8.8 - 11/17/2014   Principal Problem:   ITP (idiopathic thrombocytopenic purpura) Active Problems:   Small bowel obstruction   SBO (small bowel obstruction)  Subjective:  Doing well and passing flatus.  Ready to go home. Objective:   Filed Vitals:   11/18/14 0550  BP: 133/63  Pulse: 80  Temp: 99.1 F (37.3 C)  Resp: 18     Intake/Output from previous day:  12/24 0701 - 12/25 0700 In: 2943.8 [P.O.:1080; I.V.:1863.8] Out: 1000 [Urine:1000]  Intake/Output this shift:      Physical Exam:   General: [name] who is alert and oriented.    HEENT: Normal. Pupils equal. .   Lungs: Clear   Abdomen: Soft.  Has BS.   Wound: Has penrose drain that is essentially out.  I removed the penrose and cleaned wound with betadine.  Looks okay.     Lab Results:    Recent Labs  11/16/14 0425 11/17/14 0007  WBC 22.6* 17.4*  HGB 9.1* 8.8*  HCT 29.9* 29.1*  PLT 917* 664*    BMET   Recent Labs  11/16/14 0425  NA 136  K 3.8  CL 105  CO2 24  GLUCOSE 78  BUN 15  CREATININE 0.94  CALCIUM 8.1*    PT/INR  No results for input(s): LABPROT, INR in the last 72 hours.  ABG  No results for input(s): PHART, HCO3 in the last 72 hours.  Invalid input(s): PCO2, PO2   Studies/Results:  No results found.   Anti-infectives:   Anti-infectives    Start     Dose/Rate Route Frequency Ordered Stop   11/15/14 0600  ceFAZolin (ANCEF) IVPB 2 g/50 mL premix     2 g100 mL/hr over 30 Minutes Intravenous On  call to O.R. 11/14/14 1707 11/14/14 1740   11/08/14 1400  ceFAZolin (ANCEF) IVPB 2 g/50 mL premix     2 g100 mL/hr over 30 Minutes Intravenous 3 times per day 11/08/14 1238 11/09/14 0629   11/08/14 0545  ceFAZolin (ANCEF) IVPB 2 g/50 mL premix  Status:  Discontinued     2 g100 mL/hr over 30 Minutes Intravenous On call to O.R. 11/08/14 0545 11/08/14 0550   11/08/14 0545  ceFAZolin (ANCEF) IVPB 2 g/50 mL premix     2 g100 mL/hr over 30 Minutes Intravenous On call to O.R. 11/08/14 0545 11/08/14 0750      Alphonsa Overall, MD, FACS Pager: [facility] Office: [phone] 11/18/2014

## 2014-11-18 NOTE — Discharge Summary (Signed)
Physician Discharge Summary  Patient ID:  Scott Waters  MRN: [id number]  DOB/AGE: 1956/07/27 58 y.o.  Admit date: 11/08/2014 Discharge date: 11/18/2014  Discharge Diagnoses:  1. ITP (Idiopathic thrombocytopenic purpura)  Most recent platelets post op -Plts - 664,000 - 11/17/2014  2.  Post op small bowel obstruction 3. HTN 4. Anemia - Hgb 8.8 - 11/17/2014   Principal Problem:   ITP (idiopathic thrombocytopenic purpura) Active Problems:   Small bowel obstruction   SBO (small bowel obstruction)  Operation: Procedure(s): 1. LAPAROSCOPIC SPLENECTOMY - 11/08/2014 - H. Ingram 2.  EXPLORATORY LAPAROTOMY, RELEASE SMALL BOWEL OBSTRUCTION, INCISIONAL HERNIA REPAIR on 11/14/2014 - H. Dalbert Batman  Discharged Condition: good  Hospital Course: Scott Waters is an 58 y.o. male whose primary care physician is [name], MD and who was admitted 11/08/2014 with a chief complaint of ITP.  He went to the OR and had a LAPAROSCOPIC SPLENECTOMY by Dr. Dalbert Batman on 11/08/2014. Unfortunately, post op, he developed a SBO secondary to an abdominal wall hernia. He went to the OR on 11/14/2014 and underwent a EXPLORATORY LAPAROTOMY, RELEASE SMALL BOWEL OBSTRUCTION, INCISIONAL HERNIA REPAIR by Dr. Dalbert Batman.  He is now 4 days post op from his second operation.  He is tolerating full liquids, passing flatus, dong well, and ready to go home. I removed the penrose drain in the wound today and redressed his wound.  The discharge instructions were reviewed with the patient.  Consults: None  Significant Diagnostic Studies: Results for orders placed or performed during the hospital encounter of 11/08/14  MRSA PCR Screening  Result Value Ref Range   MRSA by PCR NEGATIVE NEGATIVE  CBC  Result Value Ref Range   WBC 8.6 4.0 - 10.5 K/uL   RBC 4.50 4.22 - 5.81 MIL/uL   Hemoglobin 11.5 (L) 13.0 - 17.0 g/dL   HCT 36.4 (L) 39.0 - 52.0 %   MCV 80.9 78.0 - 100.0 fL   MCH 25.6 (L) 26.0 - 34.0 pg   MCHC  31.6 30.0 - 36.0 g/dL   RDW 16.0 (H) 11.5 - 15.5 %   Platelets 15 (LL) 150 - 400 K/uL  CBC  Result Value Ref Range   WBC 25.4 (H) 4.0 - 10.5 K/uL   RBC 3.81 (L) 4.22 - 5.81 MIL/uL   Hemoglobin 9.7 (L) 13.0 - 17.0 g/dL   HCT 31.1 (L) 39.0 - 52.0 %   MCV 81.6 78.0 - 100.0 fL   MCH 25.5 (L) 26.0 - 34.0 pg   MCHC 31.2 30.0 - 36.0 g/dL   RDW 16.0 (H) 11.5 - 15.5 %   Platelets 112 (L) 150 - 400 K/uL  CBC  Result Value Ref Range   WBC 20.3 (H) 4.0 - 10.5 K/uL   RBC 3.95 (L) 4.22 - 5.81 MIL/uL   Hemoglobin 10.0 (L) 13.0 - 17.0 g/dL   HCT 31.7 (L) 39.0 - 52.0 %   MCV 80.3 78.0 - 100.0 fL   MCH 25.3 (L) 26.0 - 34.0 pg   MCHC 31.5 30.0 - 36.0 g/dL   RDW 15.8 (H) 11.5 - 15.5 %   Platelets 102 (L) 150 - 400 K/uL  CBC  Result Value Ref Range   WBC 17.4 (H) 4.0 - 10.5 K/uL   RBC 3.70 (L) 4.22 - 5.81 MIL/uL   Hemoglobin 9.5 (L) 13.0 - 17.0 g/dL   HCT 29.9 (L) 39.0 - 52.0 %   MCV 80.8 78.0 - 100.0 fL   MCH 25.7 (L) 26.0 - 34.0 pg   MCHC 31.8  30.0 - 36.0 g/dL   RDW 16.1 (H) 11.5 - 15.5 %   Platelets 85 (L) 150 - 400 K/uL  Basic metabolic panel  Result Value Ref Range   Sodium 138 137 - 147 mEq/L   Potassium 4.5 3.7 - 5.3 mEq/L   Chloride 102 96 - 112 mEq/L   CO2 24 19 - 32 mEq/L   Glucose, Bld 108 (H) 70 - 99 mg/dL   BUN 15 6 - 23 mg/dL   Creatinine, Ser 0.92 0.50 - 1.35 mg/dL   Calcium 9.0 8.4 - 10.5 mg/dL   GFR calc non Af Amer 90 (L) >90 mL/min   GFR calc Af Amer >90 >90 mL/min   Anion gap 12 5 - 15  CBC  Result Value Ref Range   WBC 15.2 (H) 4.0 - 10.5 K/uL   RBC 3.92 (L) 4.22 - 5.81 MIL/uL   Hemoglobin 9.9 (L) 13.0 - 17.0 g/dL   HCT 31.9 (L) 39.0 - 52.0 %   MCV 81.4 78.0 - 100.0 fL   MCH 25.3 (L) 26.0 - 34.0 pg   MCHC 31.0 30.0 - 36.0 g/dL   RDW 16.3 (H) 11.5 - 15.5 %   Platelets 150 150 - 400 K/uL  Basic metabolic panel  Result Value Ref Range   Sodium 138 137 - 147 mEq/L   Potassium 3.7 3.7 - 5.3 mEq/L   Chloride 99 96 - 112 mEq/L   CO2 26 19 - 32 mEq/L    Glucose, Bld 112 (H) 70 - 99 mg/dL   BUN 12 6 - 23 mg/dL   Creatinine, Ser 0.90 0.50 - 1.35 mg/dL   Calcium 9.3 8.4 - 10.5 mg/dL   GFR calc non Af Amer >90 >90 mL/min   GFR calc Af Amer >90 >90 mL/min   Anion gap 13 5 - 15  Lipase, blood  Result Value Ref Range   Lipase 25 11 - 59 U/L  Lipase, blood  Result Value Ref Range   Lipase 22 11 - 59 U/L  Amylase, Peritoneal Fluid  Result Value Ref Range   Amylase, peritoneal fluid 34 U/L  CBC  Result Value Ref Range   WBC 14.4 (H) 4.0 - 10.5 K/uL   RBC 4.08 (L) 4.22 - 5.81 MIL/uL   Hemoglobin 10.2 (L) 13.0 - 17.0 g/dL   HCT 32.8 (L) 39.0 - 52.0 %   MCV 80.4 78.0 - 100.0 fL   MCH 25.0 (L) 26.0 - 34.0 pg   MCHC 31.1 30.0 - 36.0 g/dL   RDW 16.1 (H) 11.5 - 15.5 %   Platelets 347 150 - 400 K/uL  Basic metabolic panel  Result Value Ref Range   Sodium 142 137 - 147 mEq/L   Potassium 3.9 3.7 - 5.3 mEq/L   Chloride 100 96 - 112 mEq/L   CO2 28 19 - 32 mEq/L   Glucose, Bld 115 (H) 70 - 99 mg/dL   BUN 12 6 - 23 mg/dL   Creatinine, Ser 0.89 0.50 - 1.35 mg/dL   Calcium 9.0 8.4 - 10.5 mg/dL   GFR calc non Af Amer >90 >90 mL/min   GFR calc Af Amer >90 >90 mL/min   Anion gap 14 5 - 15  Lipase, blood  Result Value Ref Range   Lipase 24 11 - 59 U/L  CBC with Differential  Result Value Ref Range   WBC 15.4 (H) 4.0 - 10.5 K/uL   RBC 4.15 (L) 4.22 - 5.81 MIL/uL   Hemoglobin 10.3 (L) 13.0 -  17.0 g/dL   HCT 33.2 (L) 39.0 - 52.0 %   MCV 80.0 78.0 - 100.0 fL   MCH 24.8 (L) 26.0 - 34.0 pg   MCHC 31.0 30.0 - 36.0 g/dL   RDW 16.1 (H) 11.5 - 15.5 %   Platelets 657 (H) 150 - 400 K/uL   Neutrophils Relative % 83 (H) 43 - 77 %   Neutro Abs 12.8 (H) 1.7 - 7.7 K/uL   Lymphocytes Relative 7 (L) 12 - 46 %   Lymphs Abs 1.1 0.7 - 4.0 K/uL   Monocytes Relative 9 3 - 12 %   Monocytes Absolute 1.4 (H) 0.1 - 1.0 K/uL   Eosinophils Relative 1 0 - 5 %   Eosinophils Absolute 0.1 0.0 - 0.7 K/uL   Basophils Relative 0 0 - 1 %   Basophils Absolute 0.0 0.0 -  0.1 K/uL  CBC  Result Value Ref Range   WBC 13.1 (H) 4.0 - 10.5 K/uL   RBC 4.12 (L) 4.22 - 5.81 MIL/uL   Hemoglobin 10.2 (L) 13.0 - 17.0 g/dL   HCT 33.8 (L) 39.0 - 52.0 %   MCV 82.0 78.0 - 100.0 fL   MCH 24.8 (L) 26.0 - 34.0 pg   MCHC 30.2 30.0 - 36.0 g/dL   RDW 16.3 (H) 11.5 - 15.5 %   Platelets 1028 (HH) 150 - 400 K/uL  Pathologist smear review  Result Value Ref Range   Path Review Reviewed By Violet Baldy, M.D.   Basic metabolic panel  Result Value Ref Range   Sodium 141 137 - 147 mEq/L   Potassium 4.3 3.7 - 5.3 mEq/L   Chloride 102 96 - 112 mEq/L   CO2 24 19 - 32 mEq/L   Glucose, Bld 89 70 - 99 mg/dL   BUN 23 6 - 23 mg/dL   Creatinine, Ser 0.98 0.50 - 1.35 mg/dL   Calcium 9.4 8.4 - 10.5 mg/dL   GFR calc non Af Amer 88 (L) >90 mL/min   GFR calc Af Amer >90 >90 mL/min   Anion gap 15 5 - 15  Lipase, blood  Result Value Ref Range   Lipase 516 (H) 11 - 59 U/L  Amylase, Peritoneal Fluid  Result Value Ref Range   Amylase, peritoneal fluid 134 U/L  CBC  Result Value Ref Range   WBC 20.0 (H) 4.0 - 10.5 K/uL   RBC 4.07 (L) 4.22 - 5.81 MIL/uL   Hemoglobin 10.0 (L) 13.0 - 17.0 g/dL   HCT 33.2 (L) 39.0 - 52.0 %   MCV 81.6 78.0 - 100.0 fL   MCH 24.6 (L) 26.0 - 34.0 pg   MCHC 30.1 30.0 - 36.0 g/dL   RDW 16.2 (H) 11.5 - 15.5 %   Platelets 1012 (HH) 150 - 400 K/uL  Creatinine, serum  Result Value Ref Range   Creatinine, Ser 1.02 0.50 - 1.35 mg/dL   GFR calc non Af Amer 78 (L) >90 mL/min   GFR calc Af Amer >90 >90 mL/min  Comprehensive metabolic panel  Result Value Ref Range   Sodium 141 135 - 145 mmol/L   Potassium 4.2 3.5 - 5.1 mmol/L   Chloride 108 96 - 112 mEq/L   CO2 22 19 - 32 mmol/L   Glucose, Bld 96 70 - 99 mg/dL   BUN 22 6 - 23 mg/dL   Creatinine, Ser 1.09 0.50 - 1.35 mg/dL   Calcium 8.1 (L) 8.4 - 10.5 mg/dL   Total Protein 6.0 6.0 - 8.3 g/dL  Albumin 3.2 (L) 3.5 - 5.2 g/dL   AST 22 0 - 37 U/L   ALT 17 0 - 53 U/L   Alkaline Phosphatase 69 39 - 117 U/L    Total Bilirubin 0.5 0.3 - 1.2 mg/dL   GFR calc non Af Amer 72 (L) >90 mL/min   GFR calc Af Amer 83 (L) >90 mL/min   Anion gap 11 5 - 15  CBC  Result Value Ref Range   WBC 35.1 (H) 4.0 - 10.5 K/uL   RBC 3.77 (L) 4.22 - 5.81 MIL/uL   Hemoglobin 9.5 (L) 13.0 - 17.0 g/dL   HCT 30.9 (L) 39.0 - 52.0 %   MCV 82.0 78.0 - 100.0 fL   MCH 25.2 (L) 26.0 - 34.0 pg   MCHC 30.7 30.0 - 36.0 g/dL   RDW 16.3 (H) 11.5 - 15.5 %   Platelets 978 (HH) 150 - 400 K/uL  CBC  Result Value Ref Range   WBC 22.6 (H) 4.0 - 10.5 K/uL   RBC 3.63 (L) 4.22 - 5.81 MIL/uL   Hemoglobin 9.1 (L) 13.0 - 17.0 g/dL   HCT 29.9 (L) 39.0 - 52.0 %   MCV 82.4 78.0 - 100.0 fL   MCH 25.1 (L) 26.0 - 34.0 pg   MCHC 30.4 30.0 - 36.0 g/dL   RDW 16.4 (H) 11.5 - 15.5 %   Platelets 917 (HH) 150 - 400 K/uL  Basic metabolic panel  Result Value Ref Range   Sodium 136 135 - 145 mmol/L   Potassium 3.8 3.5 - 5.1 mmol/L   Chloride 105 96 - 112 mEq/L   CO2 24 19 - 32 mmol/L   Glucose, Bld 78 70 - 99 mg/dL   BUN 15 6 - 23 mg/dL   Creatinine, Ser 0.94 0.50 - 1.35 mg/dL   Calcium 8.1 (L) 8.4 - 10.5 mg/dL   GFR calc non Af Amer 89 (L) >90 mL/min   GFR calc Af Amer >90 >90 mL/min   Anion gap 7 5 - 15  CBC  Result Value Ref Range   WBC 17.4 (H) 4.0 - 10.5 K/uL   RBC 3.65 (L) 4.22 - 5.81 MIL/uL   Hemoglobin 8.8 (L) 13.0 - 17.0 g/dL   HCT 29.1 (L) 39.0 - 52.0 %   MCV 79.7 78.0 - 100.0 fL   MCH 24.1 (L) 26.0 - 34.0 pg   MCHC 30.2 30.0 - 36.0 g/dL   RDW 16.2 (H) 11.5 - 15.5 %   Platelets 664 (H) 150 - 400 K/uL  Prepare Pheresed Platelets  Result Value Ref Range   Unit Number [id number]    Blood Component Type PLTPHER LR3    Unit division 00    Status of Unit ISSUED,FINAL    Transfusion Status OK TO TRANSFUSE    Unit Number [id number]    Blood Component Type PLTPHER LR2    Unit division 00    Status of Unit ISSUED,FINAL    Transfusion Status OK TO TRANSFUSE    Unit Number [id number]    Blood Component Type PLTPHER LR3     Unit division 00    Status of Unit REL FROM [facility]    Transfusion Status OK TO TRANSFUSE   Prepare RBC  Result Value Ref Range   Order Confirmation ORDER PROCESSED BY BLOOD BANK   Prepare Pheresed Platelets  Result Value Ref Range   Unit Number [id number]    Blood Component Type PLTPHER LR1    Unit division 00    Status  of Unit REL FROM [facility]    Transfusion Status OK TO TRANSFUSE     Ct Abdomen Pelvis W Contrast  11/14/2014   ADDENDUM REPORT: 11/14/2014 15:57  ADDENDUM: These results were called by telephone at the time of interpretation on 11/14/2014 at 3:57 pm to Dr. Fanny Skates , who verbally acknowledged these results.   Electronically Signed   By: Lowella Grip M.D.   On: 11/14/2014 15:57   11/14/2014   CLINICAL DATA:  Pain following laparoscopic splenectomy. Elevated lipase. Nausea, vomiting, and constipation.  EXAM: CT ABDOMEN AND PELVIS WITH CONTRAST  TECHNIQUE: Multidetector CT imaging of the abdomen and pelvis was performed using the standard protocol following bolus administration of intravenous contrast. Oral contrast was also administered.  CONTRAST:  59mL OMNIPAQUE IOHEXOL 300 MG/ML SOLN, 117mL OMNIPAQUE IOHEXOL 300 MG/ML SOLN  COMPARISON:  Apr 21, 2014  FINDINGS: There is posterior left base atelectasis. Lung bases are otherwise clear.  No focal liver lesions are identified. Gallbladder wall is not thickened. There is no biliary duct dilatation.  The spleen is absent with a surgical drain in the posterior left upper quadrant. Mild inflammatory type change in this area is consistent with recent surgery. There is no evidence of abscess or abnormal fluid collection in the posterior left upper quadrant in the area of recent splenectomy.  Pancreas appears normal. Left adrenal appears normal. There is a right adrenal myelolipoma containing moderate fat measuring 2.5 x 1 2.1 cm.  Kidneys bilaterally show no mass or hydronephrosis on either side. There is no renal or ureteral  calculus on either side.  In the pelvis, the urinary bladder is midline with normal wall thickness. There is a small amount of fat in each inguinal ring. There is no pelvic mass or fluid collection. There are sigmoid diverticula without diverticulitis.  There is a ventral hernia which contains fat and small bowel. The small bowel which is present in the hernia is not dilated. There is evidence of bowel obstruction with a transition zone just proximal to this hernia. There is no free air or portal venous air.  There is no appreciable ascites or abscess. There is no adenopathy in the abdomen or pelvis. There is atherosclerotic change in the aorta but no aneurysm. There are no blastic or lytic bone lesions. There is marked degenerative change in the lumbar spine at L5-S1 with 8 mm of anterolisthesis of L5 on S1. 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  IMPRESSION: Small bowel obstruction with the transition zone just proximal to a ventral hernia which contains fat and small bowel.  Status post splenectomy with a drain in this area but no abscess or significant inflammation.  Right adrenal myelolipoma, containing moderate fat.  Sigmoid diverticulosis without diverticulitis.  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  Electronically Signed: By: Lowella Grip M.D. On: 11/14/2014 15:37   Dg Abd 2 Views  11/14/2014   CLINICAL DATA:  Constipation.  Surgery 6 days ago.  EXAM: ABDOMEN - 2 VIEW  COMPARISON:  CT 04/21/2014.  FINDINGS: Surgical drainage left upper quadrant. Prominent dilated loops of small bowel. There is a paucity of intra colonic gas noted. Although these findings could be related to postoperative adynamic ileus, he has findings suspicious for small-bowel obstruction. Follow-up abdominal series suggest to demonstrate resolution. No free air. Pelvic phleboliths. Degenerative changes thoracolumbar spine.  IMPRESSION: 1. Surgical drainage  tube left upper quadrant. 2. Prominent dilated loops of small bowel with  a paucity of intra colonic air. Although these findings could be related to postoperative adynamic ileus small-bowel obstruction cannot be excluded and close follow-up abdominal series suggested to demonstrate resolution .   Electronically Signed   By: Marcello Moores  Register   On: 11/14/2014 09:50    Discharge Exam:  Filed Vitals:   11/18/14 0550  BP: 133/63  Pulse: 80  Temp: 99.1 F (37.3 C)  Resp: 18    General: [name] who is alert and generally healthy appearing.  Lungs: Clear to auscultation and symmetric breath sounds. Heart:  RRR. No murmur or rub. Abdomen: Soft. . Normal bowel sounds.  His incision looks good.  I removed the penrose drain.  Discharge Medications:     Medication List    TAKE these medications        AZOR 10-40 MG per tablet  Generic drug:  amLODipine-olmesartan  Take 1 tablet by mouth every morning.     CINNAMON PO  Take 1,000 mg by mouth every morning.     fexofenadine 180 MG tablet  Commonly known as:  ALLEGRA  Take 180 mg by mouth every morning.     Fish Oil 1000 MG Cpdr  Take by mouth every morning.     fluticasone 50 MCG/ACT nasal spray  Commonly known as:  FLONASE  Place 2 sprays into both nostrils daily as needed for allergies.     folic acid 1 MG tablet  Commonly known as:  FOLVITE  Take 1 tablet (1 mg total) by mouth daily.     glucosamine-chondroitin 500-400 MG tablet  Take 2 tablets by mouth every morning.     HYDROcodone-acetaminophen 5-325 MG per tablet  Commonly known as:  NORCO/VICODIN  Take 1-2 tablets by mouth every 6 (six) hours as needed.     ibuprofen 200 MG tablet  Commonly known as:  ADVIL,MOTRIN  Take 400 mg by mouth 2 (two) times daily.     multivitamin with minerals Tabs tablet  Take 1 tablet by mouth every morning.     OVER THE COUNTER MEDICATION  Take 100 mg by mouth every morning. [place] Ginsing     OVER THE COUNTER MEDICATION  Take 2  tablets by mouth every morning. Liver Aid     pantoprazole 40 MG tablet  Commonly known as:  PROTONIX  Take 1 tablet (40 mg total) by mouth daily.     potassium chloride 10 MEQ tablet  Commonly known as:  K-DUR,KLOR-CON  Take 20 mEq by mouth every morning.     potassium chloride 10 MEQ tablet  Commonly known as:  K-DUR,KLOR-CON  take 2 tablets by mouth once daily     PROBIOTIC DAILY PO  Take 1 tablet by mouth every morning.     simvastatin 40 MG tablet  Commonly known as:  ZOCOR  Take 40 mg by mouth every morning.     VITAMIN K PO  Take 100 mcg by mouth every morning. OTC med        Disposition: 01-Home or Self Care      Discharge Instructions    Diet - low sodium heart healthy    Complete by:  As directed      Increase activity slowly    Complete by:  As directed           Return to work on:  About one month from last surgery  Activity:  Driving - May drive in 3 or 4 days, if doing well   Lifting -  No lifiting > 15 pounds for 3 weeks.  Wound Care:   May shower starting tomorrow.  Remove entire bandage.  Then place gauze over wound after shower.  Diet:  As tolerated  Follow up appointment:  You have appt with Dr. Dalbert Batman on Monday, 11/28.  Call Dr. Darrel Hoover office [facility]) at [phone] to confirm appointment   Medications and dosages:  Resume your home medications.  You have a prescription for:  Vicodin   Signed: Alphonsa Overall, M.D., [facility] Office:  [phone]  11/18/2014, 9:38 AM

## 2014-11-18 NOTE — Discharge Instructions (Signed)
[facility] - DISCHARGE INSTRUCTIONS TO PATIENT  Return to work on:  About one month from last surgery  Activity:  Driving - May drive in 3 or 4 days, if doing well   Lifting - No lifiting > 15 pounds for 3 weeks.  Wound Care:   May shower starting tomorrow.  Remove entire bandage.  Then place gauze over wound after shower.  Diet:  As tolerated  Follow up appointment:  You have appt with Dr. Dalbert Batman on Monday, 11/28.  Call Dr. Darrel Hoover office [facility]) at [phone] to confirm appointment   Medications and dosages:  Resume your home medications.  You have a prescription for:  Vicodin  Call Dr. Dalbert Batman or his office  [phone]) if you have:  Temperature greater than 100.4,  Persistent nausea and vomiting,  Severe uncontrolled pain,  Redness, tenderness, or signs of infection (pain, swelling, redness, odor or green/yellow discharge around the site),  Difficulty breathing, headache or visual disturbances,  Any other questions or concerns you may have after discharge.  In an emergency, call 911 or go to an Emergency Department at a nearby hospital.

## 2014-11-22 ENCOUNTER — Other Ambulatory Visit: Payer: Self-pay | Admitting: *Deleted

## 2014-11-22 ENCOUNTER — Other Ambulatory Visit: Payer: Self-pay | Admitting: Internal Medicine

## 2014-11-22 NOTE — Telephone Encounter (Signed)
Do not see where [facility] has prescribed this medication.  Will send for review.

## 2014-11-24 NOTE — Telephone Encounter (Signed)
i may have told him to get this in past . contact patient and ask about this  Ok to rx x 6 if helping him  He just got  out of hospital after splenectomy and complications.

## 2014-11-24 NOTE — Telephone Encounter (Signed)
Pt requested refills.  Sent to the pharmacy by e-scribe.

## 2014-11-28 NOTE — Addendum Note (Signed)
Addendum  created 11/28/14 1933 by Milana Obey, MD   Modules edited: Anesthesia Responsible Staff

## 2014-12-05 ENCOUNTER — Encounter: Payer: Self-pay | Admitting: *Deleted

## 2014-12-05 ENCOUNTER — Ambulatory Visit (HOSPITAL_BASED_OUTPATIENT_CLINIC_OR_DEPARTMENT_OTHER): Payer: BLUE CROSS/BLUE SHIELD | Admitting: Hematology & Oncology

## 2014-12-05 ENCOUNTER — Other Ambulatory Visit (HOSPITAL_BASED_OUTPATIENT_CLINIC_OR_DEPARTMENT_OTHER): Payer: BLUE CROSS/BLUE SHIELD | Admitting: Lab

## 2014-12-05 ENCOUNTER — Ambulatory Visit: Payer: BC Managed Care – PPO

## 2014-12-05 ENCOUNTER — Encounter: Payer: Self-pay | Admitting: Hematology & Oncology

## 2014-12-05 VITALS — BP 139/70 | HR 83 | Temp 98.8°F | Resp 18 | Ht 68.0 in | Wt 195.0 lb

## 2014-12-05 DIAGNOSIS — D693 Immune thrombocytopenic purpura: Secondary | ICD-10-CM

## 2014-12-05 LAB — CBC WITH DIFFERENTIAL (CANCER CENTER ONLY)
BASO#: 0.1 10*3/uL (ref 0.0–0.2)
BASO%: 1.1 % (ref 0.0–2.0)
EOS%: 3.3 % (ref 0.0–7.0)
Eosinophils Absolute: 0.2 10*3/uL (ref 0.0–0.5)
HCT: 35.7 % — ABNORMAL LOW (ref 38.7–49.9)
HGB: 11.2 g/dL — ABNORMAL LOW (ref 13.0–17.1)
LYMPH#: 1.1 10*3/uL (ref 0.9–3.3)
LYMPH%: 23.9 % (ref 14.0–48.0)
MCH: 24.7 pg — ABNORMAL LOW (ref 28.0–33.4)
MCHC: 31.4 g/dL — ABNORMAL LOW (ref 32.0–35.9)
MCV: 79 fL — AB (ref 82–98)
MONO#: 0.7 10*3/uL (ref 0.1–0.9)
MONO%: 14.9 % — AB (ref 0.0–13.0)
NEUT#: 2.6 10*3/uL (ref 1.5–6.5)
NEUT%: 56.8 % (ref 40.0–80.0)
Platelets: 39 10*3/uL — ABNORMAL LOW (ref 145–400)
RBC: 4.53 10*6/uL (ref 4.20–5.70)
RDW: 16 % — ABNORMAL HIGH (ref 11.1–15.7)
WBC: 4.5 10*3/uL (ref 4.0–10.0)

## 2014-12-05 LAB — TECHNOLOGIST REVIEW CHCC SATELLITE

## 2014-12-05 NOTE — Progress Notes (Signed)
Hematology and Oncology Follow Up Visit  Scott Waters [id number] 06-21-1956 59 y.o. 12/05/2014   Principle Diagnosis:   Refractory immune thrombocytopenia  Current Therapy:   Status post splenectomy     Interim History:  Mr.  Waters is for follow-up. He had a spleen taken out back in December. This is on December 15. He is not have an accessory spleen. The pathology was unremarkable.  Postop, he had an obstruction. He needed re-exploration.  Splenic count post splenectomy went up to almost 900,000. Scott Waters had him on some aspirin.  He's had some occasional nosebleed. His laparotomy scar from the obstruction has had a little bit of oozing. Otherwise, he's had no problems with bleeding.  There's been no change in medications. Medications: Current outpatient prescriptions: amLODipine-olmesartan (AZOR) 10-40 MG per tablet, Take 1 tablet by mouth every morning. , Disp: , Rfl: ;  CINNAMON PO, Take 1,000 mg by mouth every morning., Disp: , Rfl: ;  fexofenadine (ALLEGRA) 180 MG tablet, Take 180 mg by mouth every morning., Disp: , Rfl: ;  fluticasone (FLONASE) 50 MCG/ACT nasal spray, instill 2 sprays into each nostril once daily, Disp: 16 g, Rfl: 5 folic acid (FOLVITE) 1 MG tablet, Take 1 tablet (1 mg total) by mouth daily. (Patient taking differently: Take 1 mg by mouth every morning. ), Disp: 30 tablet, Rfl: 6;  glucosamine-chondroitin 500-400 MG tablet, Take 2 tablets by mouth every morning., Disp: , Rfl: ;  HYDROcodone-acetaminophen (NORCO/VICODIN) 5-325 MG per tablet, Take 1-2 tablets by mouth every 6 (six) hours as needed., Disp: 30 tablet, Rfl: 0 ibuprofen (ADVIL,MOTRIN) 200 MG tablet, Take 400 mg by mouth 2 (two) times daily., Disp: , Rfl: ;  Multiple Vitamin (MULTIVITAMIN WITH MINERALS) TABS tablet, Take 1 tablet by mouth every morning. , Disp: , Rfl: ;  Omega-3 Fatty Acids (FISH OIL) 1000 MG CPDR, Take by mouth every morning., Disp: , Rfl: ;  OVER THE COUNTER MEDICATION, Take 100 mg by mouth  every morning. [place] Ginsing, Disp: , Rfl:  OVER THE COUNTER MEDICATION, Take 2 tablets by mouth every morning. Liver Aid, Disp: , Rfl: ;  pantoprazole (PROTONIX) 40 MG tablet, Take 1 tablet (40 mg total) by mouth daily. (Patient taking differently: Take 40 mg by mouth every morning. ), Disp: 30 tablet, Rfl: 6;  potassium chloride (K-DUR,KLOR-CON) 10 MEQ tablet, take 2 tablets by mouth once daily, Disp: 60 tablet, Rfl: 5 Probiotic Product (PROBIOTIC DAILY PO), Take 1 tablet by mouth every morning. , Disp: , Rfl: ;  simvastatin (ZOCOR) 40 MG tablet, Take 40 mg by mouth every morning. , Disp: , Rfl: ;  VITAMIN K PO, Take 100 mcg by mouth every morning. OTC med, Disp: , Rfl:  No current facility-administered medications for this visit. Facility-Administered Medications Ordered in Other Visits: 0.9 %  sodium chloride infusion, , Intravenous, Once, Fanny Skates, MD;  0.9 %  sodium chloride infusion, , Intravenous, Once, Fanny Skates, MD  Allergies: No Known Allergies  Past Medical History, Surgical history, Social history, and Family History were reviewed and updated.  Review of Systems: As above  Physical Exam:  height is 5\' 8"  (1.727 m) and weight is 195 lb (88.451 kg). His oral temperature is 98.8 F (37.1 C). His blood pressure is 139/70 and his pulse is 83. His respiration is 18.   He has no ocular or oral lesions. There is no lymphadenopathy. There is no palpable liver or spleen. His lungs are clear. Cardiac exam regular rate and rhythm. There are no  murmurs, rubs or bruits. Neurological exam is nonfocal. Head and neck exam shows no adenopathy. There is no petechia within the oral cavity. He has good strength in his extremities. he has no joint swelling. Skin exam shows no rashes, ecchymoses or petechia.  Lab Results  Component Value Date   WBC 4.5 12/05/2014   HGB 11.2* 12/05/2014   HCT 35.7* 12/05/2014   MCV 79* 12/05/2014   PLT 39* 12/05/2014     Chemistry      Component Value  Date/Time   NA 136 11/16/2014 0425   NA 139 07/20/2014 0902   K 3.8 11/16/2014 0425   K 3.3 07/20/2014 0902   CL 105 11/16/2014 0425   CL 103 07/20/2014 0902   CO2 24 11/16/2014 0425   CO2 27 07/20/2014 0902   BUN 15 11/16/2014 0425   BUN 11 07/20/2014 0902   CREATININE 0.94 11/16/2014 0425   CREATININE 0.9 07/20/2014 0902      Component Value Date/Time   CALCIUM 8.1* 11/16/2014 0425   CALCIUM 8.6 07/20/2014 0902   ALKPHOS 69 11/15/2014 0513   ALKPHOS 75 07/20/2014 0902   AST 22 11/15/2014 0513   AST 51* 07/20/2014 0902   ALT 17 11/15/2014 0513   ALT 36 07/20/2014 0902   BILITOT 0.5 11/15/2014 0513   BILITOT 0.80 07/20/2014 0902         Impression and Plan: Scott Waters is 59 year old gentleman with refractory immune thrombocytopenia. I am shocked that splenic count is back down to 39,000. He has had no problems in the hospital.  Again, told him to stop the aspirin.  I really think we can follow him right now. He is asymptomatic. I think we need to do to treat him, we can try Promacta.  [name] is as blood on the microscope. There may been a few platelet clumps but overall, I was primo unimpressed.  I must say, that his immune thrombocytopenia has been incredibly more difficult to treat that would've ever thought.   I will like to see him back in one month. I don't think we need any blood work on him in between visits.  I spent about 25-30 minutes with him today. I went over his lab work and pathology from his splenectomy.     [name], MD 1/11/201612:46 PM

## 2014-12-05 NOTE — Progress Notes (Signed)
Per MD no treatment today.

## 2014-12-07 ENCOUNTER — Other Ambulatory Visit: Payer: Self-pay | Admitting: *Deleted

## 2014-12-07 DIAGNOSIS — D696 Thrombocytopenia, unspecified: Secondary | ICD-10-CM

## 2014-12-07 MED ORDER — FOLIC ACID 1 MG PO TABS: 1.0000 mg | ORAL_TABLET | Freq: Every day | ORAL | Status: DC

## 2014-12-14 ENCOUNTER — Telehealth: Payer: Self-pay | Admitting: Hematology & Oncology

## 2014-12-14 NOTE — Telephone Encounter (Signed)
Pt moved 2-10 to 2-8 he is going back to work

## 2015-01-02 ENCOUNTER — Ambulatory Visit: Payer: BLUE CROSS/BLUE SHIELD | Admitting: Hematology & Oncology

## 2015-01-02 ENCOUNTER — Other Ambulatory Visit: Payer: BLUE CROSS/BLUE SHIELD | Admitting: Lab

## 2015-01-04 ENCOUNTER — Ambulatory Visit (HOSPITAL_BASED_OUTPATIENT_CLINIC_OR_DEPARTMENT_OTHER): Payer: BLUE CROSS/BLUE SHIELD | Admitting: Hematology & Oncology

## 2015-01-04 ENCOUNTER — Encounter: Payer: Self-pay | Admitting: Hematology & Oncology

## 2015-01-04 ENCOUNTER — Other Ambulatory Visit (HOSPITAL_BASED_OUTPATIENT_CLINIC_OR_DEPARTMENT_OTHER): Payer: BLUE CROSS/BLUE SHIELD | Admitting: Lab

## 2015-01-04 VITALS — BP 152/81 | HR 74 | Temp 98.6°F | Resp 20 | Ht 68.0 in | Wt 197.0 lb

## 2015-01-04 DIAGNOSIS — D693 Immune thrombocytopenic purpura: Secondary | ICD-10-CM

## 2015-01-04 DIAGNOSIS — M159 Polyosteoarthritis, unspecified: Secondary | ICD-10-CM

## 2015-01-04 LAB — CBC WITH DIFFERENTIAL (CANCER CENTER ONLY)
BASO#: 0.1 10*3/uL (ref 0.0–0.2)
BASO%: 1.6 % (ref 0.0–2.0)
EOS%: 3.1 % (ref 0.0–7.0)
Eosinophils Absolute: 0.2 10*3/uL (ref 0.0–0.5)
HCT: 36.9 % — ABNORMAL LOW (ref 38.7–49.9)
HGB: 11.6 g/dL — ABNORMAL LOW (ref 13.0–17.1)
LYMPH#: 1.6 10*3/uL (ref 0.9–3.3)
LYMPH%: 31.1 % (ref 14.0–48.0)
MCH: 24.4 pg — ABNORMAL LOW (ref 28.0–33.4)
MCHC: 31.4 g/dL — ABNORMAL LOW (ref 32.0–35.9)
MCV: 78 fL — AB (ref 82–98)
MONO#: 0.9 10*3/uL (ref 0.1–0.9)
MONO%: 16.9 % — ABNORMAL HIGH (ref 0.0–13.0)
NEUT#: 2.4 10*3/uL (ref 1.5–6.5)
NEUT%: 47.3 % (ref 40.0–80.0)
PLATELETS: 32 10*3/uL — AB (ref 145–400)
RBC: 4.76 10*6/uL (ref 4.20–5.70)
RDW: 16.7 % — AB (ref 11.1–15.7)
WBC: 5.1 10*3/uL (ref 4.0–10.0)

## 2015-01-04 MED ORDER — GLUCOSAMINE-CHONDROITIN 500-400 MG PO TABS: 1.0000 | ORAL_TABLET | Freq: Every morning | ORAL | Status: DC

## 2015-01-04 NOTE — Progress Notes (Signed)
Hematology and Oncology Follow Up Visit  Dimas Scheck [id number] 1956/03/08 59 y.o. 01/04/2015   Principle Diagnosis:   Refractory immune thrombocytopenia  Current Therapy:   Status post splenectomy     Interim History:  Mr.  Quintela is for follow-up. He's doing pretty well. He's had no bleeding. He said he did have a little bit of a nosebleed this morning.  He's taking a lot of supplements. He's taking glucosamine. He's taking fish oil. He's taking something for his liver. He is taking something for memory. I told to try to stop taking these. I'm assured if these might be related to his plan account going back down.  He's had no fever. He's had no abdominal pain. He's had no cough.  He's not noted any leg swelling.  He goes back to work on Monday.    Medications:  Current outpatient prescriptions:  .  amLODipine-olmesartan (AZOR) 10-40 MG per tablet, Take 1 tablet by mouth every morning. , Disp: , Rfl:  .  CINNAMON PO, Take 1,000 mg by mouth every morning., Disp: , Rfl:  .  fexofenadine (ALLEGRA) 180 MG tablet, Take 180 mg by mouth every morning., Disp: , Rfl:  .  fluticasone (FLONASE) 50 MCG/ACT nasal spray, instill 2 sprays into each nostril once daily, Disp: 16 g, Rfl: 5 .  folic acid (FOLVITE) 1 MG tablet, Take 1 tablet (1 mg total) by mouth daily., Disp: 30 tablet, Rfl: 6 .  glucosamine-chondroitin 500-400 MG tablet, Take 1 tablet by mouth every morning., Disp: 30 tablet, Rfl: 5 .  ibuprofen (ADVIL,MOTRIN) 200 MG tablet, Take 400 mg by mouth 2 (two) times daily., Disp: , Rfl:  .  Multiple Vitamin (MULTIVITAMIN WITH MINERALS) TABS tablet, Take 1 tablet by mouth every morning. , Disp: , Rfl:  .  pantoprazole (PROTONIX) 40 MG tablet, Take 1 tablet (40 mg total) by mouth daily. (Patient taking differently: Take 40 mg by mouth every morning. ), Disp: 30 tablet, Rfl: 6 .  potassium chloride (K-DUR,KLOR-CON) 10 MEQ tablet, take 2 tablets by mouth once daily, Disp: 60 tablet, Rfl:  5 .  Probiotic Product (PROBIOTIC DAILY PO), Take 1 tablet by mouth every morning. , Disp: , Rfl:  .  simvastatin (ZOCOR) 40 MG tablet, Take 40 mg by mouth every morning. , Disp: , Rfl:  .  VITAMIN K PO, Take 100 mcg by mouth every morning. OTC med, Disp: , Rfl:  .  HYDROcodone-acetaminophen (NORCO/VICODIN) 5-325 MG per tablet, Take 1-2 tablets by mouth every 6 (six) hours as needed. (Patient not taking: Reported on 01/04/2015), Disp: 30 tablet, Rfl: 0 No current facility-administered medications for this visit.  Facility-Administered Medications Ordered in Other Visits:  .  0.9 %  sodium chloride infusion, , Intravenous, Once, Fanny Skates, MD .  0.9 %  sodium chloride infusion, , Intravenous, Once, Fanny Skates, MD  Allergies: No Known Allergies  Past Medical History, Surgical history, Social history, and Family History were reviewed and updated.  Review of Systems: As above  Physical Exam:  height is 5\' 8"  (1.727 m) and weight is 197 lb (89.359 kg). His oral temperature is 98.6 F (37 C). His blood pressure is 152/81 and his pulse is 74. His respiration is 20.   He has no ocular or oral lesions. There is no lymphadenopathy. There is no palpable liver or spleen. His lungs are clear. Cardiac exam regular rate and rhythm. There are no murmurs, rubs or bruits. Neurological exam is nonfocal. Head and neck exam shows no  adenopathy. There is no petechia within the oral cavity. He has good strength in his extremities. he has no joint swelling. Skin exam shows no rashes, ecchymoses or petechia.  Lab Results  Component Value Date   WBC 5.1 01/04/2015   HGB 11.6* 01/04/2015   HCT 36.9* 01/04/2015   MCV 78* 01/04/2015   PLT 32* 01/04/2015     Chemistry      Component Value Date/Time   NA 136 11/16/2014 0425   NA 139 07/20/2014 0902   K 3.8 11/16/2014 0425   K 3.3 07/20/2014 0902   CL 105 11/16/2014 0425   CL 103 07/20/2014 0902   CO2 24 11/16/2014 0425   CO2 27 07/20/2014 0902    BUN 15 11/16/2014 0425   BUN 11 07/20/2014 0902   CREATININE 0.94 11/16/2014 0425   CREATININE 0.9 07/20/2014 0902      Component Value Date/Time   CALCIUM 8.1* 11/16/2014 0425   CALCIUM 8.6 07/20/2014 0902   ALKPHOS 69 11/15/2014 0513   ALKPHOS 75 07/20/2014 0902   AST 22 11/15/2014 0513   AST 51* 07/20/2014 0902   ALT 17 11/15/2014 0513   ALT 36 07/20/2014 0902   BILITOT 0.5 11/15/2014 0513   BILITOT 0.80 07/20/2014 0902         Impression and Plan: Mr. Schrager is 59 year old gentleman with refractory immune thrombocytopenia. He had a spleen taken out back in December. 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  Again, hopefully, stopping these supplements might make a difference. I know that there is not a lot of information about any interactions with platelet function and platelet manufacturer.  I want to see him back in one month. It would be very interesting to see it was placed Goes back up. If so, then clearly, one of these supplements that he is taking is the problem.      [name], MD 2/10/20163:15 PM

## 2015-01-05 ENCOUNTER — Other Ambulatory Visit: Payer: Self-pay | Admitting: Pharmacist

## 2015-02-01 ENCOUNTER — Ambulatory Visit (HOSPITAL_BASED_OUTPATIENT_CLINIC_OR_DEPARTMENT_OTHER): Payer: BLUE CROSS/BLUE SHIELD | Admitting: Hematology & Oncology

## 2015-02-01 ENCOUNTER — Other Ambulatory Visit (HOSPITAL_BASED_OUTPATIENT_CLINIC_OR_DEPARTMENT_OTHER): Payer: BLUE CROSS/BLUE SHIELD | Admitting: Lab

## 2015-02-01 ENCOUNTER — Encounter: Payer: Self-pay | Admitting: Hematology & Oncology

## 2015-02-01 VITALS — BP 131/74 | HR 87 | Temp 98.5°F | Resp 18 | Ht 68.0 in | Wt 197.0 lb

## 2015-02-01 DIAGNOSIS — D693 Immune thrombocytopenic purpura: Secondary | ICD-10-CM

## 2015-02-01 LAB — CBC WITH DIFFERENTIAL (CANCER CENTER ONLY)
BASO#: 0.1 10*3/uL (ref 0.0–0.2)
BASO%: 1.2 % (ref 0.0–2.0)
EOS%: 4.5 % (ref 0.0–7.0)
Eosinophils Absolute: 0.3 10*3/uL (ref 0.0–0.5)
HCT: 34 % — ABNORMAL LOW (ref 38.7–49.9)
HGB: 10.9 g/dL — ABNORMAL LOW (ref 13.0–17.1)
LYMPH#: 1.7 10*3/uL (ref 0.9–3.3)
LYMPH%: 28.2 % (ref 14.0–48.0)
MCH: 25.2 pg — ABNORMAL LOW (ref 28.0–33.4)
MCHC: 32.1 g/dL (ref 32.0–35.9)
MCV: 79 fL — ABNORMAL LOW (ref 82–98)
MONO#: 0.7 10*3/uL (ref 0.1–0.9)
MONO%: 11.1 % (ref 0.0–13.0)
NEUT#: 3.3 10*3/uL (ref 1.5–6.5)
NEUT%: 55 % (ref 40.0–80.0)
Platelets: 74 10*3/uL — ABNORMAL LOW (ref 145–400)
RBC: 4.33 10*6/uL (ref 4.20–5.70)
RDW: 17.9 % — ABNORMAL HIGH (ref 11.1–15.7)
WBC: 6 10*3/uL (ref 4.0–10.0)

## 2015-02-01 LAB — CHCC SATELLITE - SMEAR

## 2015-02-01 LAB — TECHNOLOGIST REVIEW CHCC SATELLITE

## 2015-02-01 NOTE — Progress Notes (Signed)
Hematology and Oncology Follow Up Visit  Scott Waters [id number] 28-Dec-1955 59 y.o. 02/01/2015   Principle Diagnosis:   Refractory immune thrombocytopenia  Current Therapy:   Status post splenectomy     Interim History:  Mr.  Waters is for follow-up. He is doing okay. He has had no problems with bleeding or bruising.  He is working. He is doing okay at work.  We did go ahead and stop some of the supplements that he was taking before. He has felt okay.  He's had no change in bowel or bladder habits. He's had no abdominal pain.   Overall, his performance status is ECOG 1.    Medications:  Current outpatient prescriptions:  .  amLODipine-olmesartan (AZOR) 10-40 MG per tablet, Take 1 tablet by mouth every morning. , Disp: , Rfl:  .  CINNAMON PO, Take 1,000 mg by mouth every morning., Disp: , Rfl:  .  fexofenadine (ALLEGRA) 180 MG tablet, Take 180 mg by mouth every morning., Disp: , Rfl:  .  fluticasone (FLONASE) 50 MCG/ACT nasal spray, instill 2 sprays into each nostril once daily, Disp: 16 g, Rfl: 5 .  folic acid (FOLVITE) 1 MG tablet, Take 1 tablet (1 mg total) by mouth daily., Disp: 30 tablet, Rfl: 6 .  glucosamine-chondroitin 500-400 MG tablet, Take 1 tablet by mouth every morning., Disp: 30 tablet, Rfl: 5 .  ibuprofen (ADVIL,MOTRIN) 200 MG tablet, Take 400 mg by mouth 2 (two) times daily., Disp: , Rfl:  .  Multiple Vitamin (MULTIVITAMIN WITH MINERALS) TABS tablet, Take 1 tablet by mouth every morning. , Disp: , Rfl:  .  pantoprazole (PROTONIX) 40 MG tablet, Take 1 tablet (40 mg total) by mouth daily. (Patient taking differently: Take 40 mg by mouth every morning. ), Disp: 30 tablet, Rfl: 6 .  potassium chloride (K-DUR,KLOR-CON) 10 MEQ tablet, take 2 tablets by mouth once daily, Disp: 60 tablet, Rfl: 5 .  Probiotic Product (PROBIOTIC DAILY PO), Take 1 tablet by mouth every morning. , Disp: , Rfl:  .  simvastatin (ZOCOR) 40 MG tablet, Take 40 mg by mouth every morning. , Disp:  , Rfl:  .  VITAMIN K PO, Take 100 mcg by mouth every morning. OTC med, Disp: , Rfl:  No current facility-administered medications for this visit.  Facility-Administered Medications Ordered in Other Visits:  .  0.9 %  sodium chloride infusion, , Intravenous, Once, Fanny Skates, MD .  0.9 %  sodium chloride infusion, , Intravenous, Once, Fanny Skates, MD  Allergies: No Known Allergies  Past Medical History, Surgical history, Social history, and Family History were reviewed and updated.  Review of Systems: As above  Physical Exam:  height is 5\' 8"  (1.727 m) and weight is 197 lb (89.359 kg). His oral temperature is 98.5 F (36.9 C). His blood pressure is 131/74 and his pulse is 87. His respiration is 18.   Head and neck exam shows no ocular or oral lesions. He has no palpable cervical or supraclavicular lymph nodes. Lungs are clear. Cardiac exam regular rate and rhythm with no murmurs, rubs or bruits. Abdomen is soft. He has laparoscopy scars from his splenectomy. There is no fluid wave. He has some keloid formation at the scar sites. There is no palpable hepatomegaly. Back exam shows no tenderness over the spine, ribs or hips. Extremities shows no clubbing, cyanosis or edema. Skin exam no rashes, ecchymoses or petechia. Neurological exam is nonfocal.  Lab Results  Component Value Date   WBC 6.0 02/01/2015  HGB 10.9* 02/01/2015   HCT 34.0* 02/01/2015   MCV 79* 02/01/2015   PLT 74 Platelet count consistent in citrate* 02/01/2015     Chemistry      Component Value Date/Time   NA 136 11/16/2014 0425   NA 139 07/20/2014 0902   K 3.8 11/16/2014 0425   K 3.3 07/20/2014 0902   CL 105 11/16/2014 0425   CL 103 07/20/2014 0902   CO2 24 11/16/2014 0425   CO2 27 07/20/2014 0902   BUN 15 11/16/2014 0425   BUN 11 07/20/2014 0902   CREATININE 0.94 11/16/2014 0425   CREATININE 0.9 07/20/2014 0902      Component Value Date/Time   CALCIUM 8.1* 11/16/2014 0425   CALCIUM 8.6 07/20/2014  0902   ALKPHOS 69 11/15/2014 0513   ALKPHOS 75 07/20/2014 0902   AST 22 11/15/2014 0513   AST 51* 07/20/2014 0902   ALT 17 11/15/2014 0513   ALT 36 07/20/2014 0902   BILITOT 0.5 11/15/2014 0513   BILITOT 0.80 07/20/2014 0902         Impression and Plan: Scott Waters is 59 year old gentleman with refractory immune thrombocytopenia. His platelet count is better. I don't know if this is from [place] stopping his supplements or this is just part of his natural history.  He is asymptomatic. I think that we can just follow along right now.  I looked at his blood iron to the microscope. I do not see anything that looked unusual or suspicious. His platelets were decreased in number but well granulated. He had several large platelets. Red cell and white cell morphology looked mature. There were no hyper segmented polys. There were no immature myeloid cells. There were no atypical lymphocytes.  Let'sget Scott Waters back in 2 months. I think this is reasonable.     [name], MD 3/9/20162:09 PM

## 2015-02-16 ENCOUNTER — Other Ambulatory Visit: Payer: Self-pay | Admitting: Family

## 2015-04-05 ENCOUNTER — Ambulatory Visit (HOSPITAL_BASED_OUTPATIENT_CLINIC_OR_DEPARTMENT_OTHER): Payer: BLUE CROSS/BLUE SHIELD | Admitting: Hematology & Oncology

## 2015-04-05 ENCOUNTER — Other Ambulatory Visit (HOSPITAL_BASED_OUTPATIENT_CLINIC_OR_DEPARTMENT_OTHER): Payer: BLUE CROSS/BLUE SHIELD

## 2015-04-05 ENCOUNTER — Encounter: Payer: Self-pay | Admitting: Hematology & Oncology

## 2015-04-05 VITALS — BP 140/84 | HR 70 | Temp 98.6°F | Resp 18 | Ht 68.0 in | Wt 195.0 lb

## 2015-04-05 DIAGNOSIS — D693 Immune thrombocytopenic purpura: Secondary | ICD-10-CM

## 2015-04-05 LAB — CBC WITH DIFFERENTIAL (CANCER CENTER ONLY)
BASO#: 0.1 10*3/uL (ref 0.0–0.2)
BASO%: 1.5 % (ref 0.0–2.0)
EOS%: 3.1 % (ref 0.0–7.0)
Eosinophils Absolute: 0.2 10*3/uL (ref 0.0–0.5)
HEMATOCRIT: 36.4 % — AB (ref 38.7–49.9)
HGB: 12.2 g/dL — ABNORMAL LOW (ref 13.0–17.1)
LYMPH#: 1.7 10*3/uL (ref 0.9–3.3)
LYMPH%: 32.9 % (ref 14.0–48.0)
MCH: 27.7 pg — AB (ref 28.0–33.4)
MCHC: 33.5 g/dL (ref 32.0–35.9)
MCV: 83 fL (ref 82–98)
MONO#: 0.7 10*3/uL (ref 0.1–0.9)
MONO%: 13.7 % — AB (ref 0.0–13.0)
NEUT#: 2.5 10*3/uL (ref 1.5–6.5)
NEUT%: 48.8 % (ref 40.0–80.0)
Platelets: 101 10*3/uL — ABNORMAL LOW (ref 145–400)
RBC: 4.41 10*6/uL (ref 4.20–5.70)
RDW: 18.2 % — ABNORMAL HIGH (ref 11.1–15.7)
WBC: 5.2 10*3/uL (ref 4.0–10.0)

## 2015-04-05 LAB — CHCC SATELLITE - SMEAR

## 2015-04-05 NOTE — Progress Notes (Signed)
Hematology and Oncology Follow Up Visit  Scott Waters [id number] 06-05-1956 59 y.o. 04/05/2015   Principle Diagnosis:   Refractory immune thrombocytopenia  Current Therapy:   Status post splenectomy     Interim History:  Scott Waters is in for follow-up. He is doing okay. He has had no problems with bleeding or bruising.  He is working. He is doing okay at work.  He has had no problems with nausea or vomiting. He is losing a little bit away. He's on exercise a little bit more.  He's had no problems with fever sweats or chills. He's had no issues with going to the bathroom. He's had no abdominal pain.  There's been no rashes. He's had no leg swelling. He's had no headache.  Overall, his performance status is ECOG 1.    Medications:  Current outpatient prescriptions:  .  amLODipine-olmesartan (AZOR) 10-40 MG per tablet, Take 1 tablet by mouth every morning. , Disp: , Rfl:  .  CINNAMON PO, Take 1,000 mg by mouth every morning., Disp: , Rfl:  .  fexofenadine (ALLEGRA) 180 MG tablet, Take 180 mg by mouth every morning., Disp: , Rfl:  .  fluticasone (FLONASE) 50 MCG/ACT nasal spray, instill 2 sprays into each nostril once daily, Disp: 16 g, Rfl: 5 .  folic acid (FOLVITE) 1 MG tablet, Take 1 tablet (1 mg total) by mouth daily., Disp: 30 tablet, Rfl: 6 .  glucosamine-chondroitin 500-400 MG tablet, Take 1 tablet by mouth every morning., Disp: 30 tablet, Rfl: 5 .  ibuprofen (ADVIL,MOTRIN) 200 MG tablet, Take 400 mg by mouth 2 (two) times daily., Disp: , Rfl:  .  Multiple Vitamin (MULTIVITAMIN WITH MINERALS) TABS tablet, Take 1 tablet by mouth every morning. , Disp: , Rfl:  .  pantoprazole (PROTONIX) 40 MG tablet, Take 1 tablet (40 mg total) by mouth daily. (Patient taking differently: Take 40 mg by mouth every morning. ), Disp: 30 tablet, Rfl: 6 .  potassium chloride (K-DUR,KLOR-CON) 10 MEQ tablet, take 2 tablets by mouth once daily, Disp: 60 tablet, Rfl: 5 .  Probiotic Product  (PROBIOTIC DAILY PO), Take 1 tablet by mouth every morning. , Disp: , Rfl:  .  simvastatin (ZOCOR) 40 MG tablet, Take 40 mg by mouth every morning. , Disp: , Rfl:  .  VITAMIN K PO, Take 100 mcg by mouth every morning. OTC med, Disp: , Rfl:  No current facility-administered medications for this visit.  Facility-Administered Medications Ordered in Other Visits:  .  0.9 %  sodium chloride infusion, , Intravenous, Once, Fanny Skates, MD .  0.9 %  sodium chloride infusion, , Intravenous, Once, Fanny Skates, MD  Allergies: No Known Allergies  Past Medical History, Surgical history, Social history, and Family History were reviewed and updated.  Review of Systems: As above  Physical Exam:  height is 5\' 8"  (1.727 m) and weight is 195 lb (88.451 kg). His oral temperature is 98.6 F (37 C). His blood pressure is 140/84 and his pulse is 70. His respiration is 18.   Head and neck exam shows no ocular or oral lesions. He has no palpable cervical or supraclavicular lymph nodes. Lungs are clear. Cardiac exam regular rate and rhythm with no murmurs, rubs or bruits. Abdomen is soft. He has laparoscopy scars from his splenectomy. There is no fluid wave. He has some keloid formation at the scar sites. There is no palpable hepatomegaly. Back exam shows no tenderness over the spine, ribs or hips. Extremities shows no clubbing, cyanosis  or edema. Skin exam no rashes, ecchymoses or petechia. Neurological exam is nonfocal.  Lab Results  Component Value Date   WBC 5.2 04/05/2015   HGB 12.2* 04/05/2015   HCT 36.4* 04/05/2015   MCV 83 04/05/2015   PLT 101* 04/05/2015     Chemistry      Component Value Date/Time   NA 136 11/16/2014 0425   NA 139 07/20/2014 0902   K 3.8 11/16/2014 0425   K 3.3 07/20/2014 0902   CL 105 11/16/2014 0425   CL 103 07/20/2014 0902   CO2 24 11/16/2014 0425   CO2 27 07/20/2014 0902   BUN 15 11/16/2014 0425   BUN 11 07/20/2014 0902   CREATININE 0.94 11/16/2014 0425    CREATININE 0.9 07/20/2014 0902      Component Value Date/Time   CALCIUM 8.1* 11/16/2014 0425   CALCIUM 8.6 07/20/2014 0902   ALKPHOS 69 11/15/2014 0513   ALKPHOS 75 07/20/2014 0902   AST 22 11/15/2014 0513   AST 51* 07/20/2014 0902   ALT 17 11/15/2014 0513   ALT 36 07/20/2014 0902   BILITOT 0.5 11/15/2014 0513   BILITOT 0.80 07/20/2014 0902         Impression and Plan: Scott Waters is 59 year old gentleman with refractory immune thrombocytopenia. His platelet count is stable. He is asymptomatic.  I think that we probably get him back now in about 3 or 4 months. He always knows that he can back and see [place] sooner if necessary.     [name], MD 5/11/20162:17 PM

## 2015-04-24 ENCOUNTER — Other Ambulatory Visit: Payer: Self-pay | Admitting: Internal Medicine

## 2015-04-26 NOTE — Telephone Encounter (Signed)
Sent to the pharmacy by e-scribe for #30.  Pt has upcoming CPX on 05/10/15.

## 2015-05-03 ENCOUNTER — Other Ambulatory Visit (INDEPENDENT_AMBULATORY_CARE_PROVIDER_SITE_OTHER): Payer: BLUE CROSS/BLUE SHIELD

## 2015-05-03 DIAGNOSIS — Z0000 Encounter for general adult medical examination without abnormal findings: Secondary | ICD-10-CM | POA: Diagnosis not present

## 2015-05-03 LAB — PSA: PSA: 3.45 ng/mL (ref 0.10–4.00)

## 2015-05-03 LAB — CBC WITH DIFFERENTIAL/PLATELET
BASOS ABS: 0 10*3/uL (ref 0.0–0.1)
Basophils Relative: 0.2 % (ref 0.0–3.0)
EOS PCT: 6.1 % — AB (ref 0.0–5.0)
Eosinophils Absolute: 0.3 10*3/uL (ref 0.0–0.7)
HCT: 39.1 % (ref 39.0–52.0)
HEMOGLOBIN: 12.7 g/dL — AB (ref 13.0–17.0)
LYMPHS ABS: 1.3 10*3/uL (ref 0.7–4.0)
Lymphocytes Relative: 30.5 % (ref 12.0–46.0)
MCHC: 32.4 g/dL (ref 30.0–36.0)
MCV: 86.5 fl (ref 78.0–100.0)
MONO ABS: 0.7 10*3/uL (ref 0.1–1.0)
Monocytes Relative: 16 % — ABNORMAL HIGH (ref 3.0–12.0)
NEUTROS ABS: 2.1 10*3/uL (ref 1.4–7.7)
Neutrophils Relative %: 47.2 % (ref 43.0–77.0)
Platelets: 101 10*3/uL — ABNORMAL LOW (ref 150.0–400.0)
RBC: 4.52 Mil/uL (ref 4.22–5.81)
RDW: 18.9 % — ABNORMAL HIGH (ref 11.5–15.5)
WBC: 4.4 10*3/uL (ref 4.0–10.5)

## 2015-05-03 LAB — BASIC METABOLIC PANEL
BUN: 15 mg/dL (ref 6–23)
CHLORIDE: 105 meq/L (ref 96–112)
CO2: 29 meq/L (ref 19–32)
Calcium: 9.2 mg/dL (ref 8.4–10.5)
Creatinine, Ser: 0.96 mg/dL (ref 0.40–1.50)
GFR: 102.34 mL/min (ref >60.00)
GLUCOSE: 84 mg/dL (ref 70–99)
Potassium: 3.9 mEq/L (ref 3.5–5.1)
SODIUM: 140 meq/L (ref 135–145)

## 2015-05-03 LAB — HEPATIC FUNCTION PANEL
ALK PHOS: 113 U/L (ref 39–117)
ALT: 33 U/L (ref 0–53)
AST: 32 U/L (ref 0–37)
Albumin: 4 g/dL (ref 3.5–5.2)
BILIRUBIN DIRECT: 0.1 mg/dL (ref 0.0–0.3)
BILIRUBIN TOTAL: 0.5 mg/dL (ref 0.2–1.2)
Total Protein: 6.3 g/dL (ref 6.0–8.3)

## 2015-05-03 LAB — TSH: TSH: 1.68 u[IU]/mL (ref 0.35–4.50)

## 2015-05-03 LAB — LIPID PANEL
CHOL/HDL RATIO: 4
CHOLESTEROL: 166 mg/dL (ref 0–200)
HDL: 44.2 mg/dL (ref >39.00)
LDL Cholesterol: 85 mg/dL (ref 0–99)
NonHDL: 121.8
TRIGLYCERIDES: 182 mg/dL — AB (ref 0.0–149.0)
VLDL: 36.4 mg/dL (ref 0.0–40.0)

## 2015-05-10 ENCOUNTER — Ambulatory Visit (INDEPENDENT_AMBULATORY_CARE_PROVIDER_SITE_OTHER): Payer: BLUE CROSS/BLUE SHIELD | Admitting: Internal Medicine

## 2015-05-10 ENCOUNTER — Encounter: Payer: Self-pay | Admitting: Internal Medicine

## 2015-05-10 VITALS — BP 120/70 | Temp 98.8°F | Ht 67.0 in | Wt 195.9 lb

## 2015-05-10 DIAGNOSIS — R0789 Other chest pain: Secondary | ICD-10-CM | POA: Diagnosis not present

## 2015-05-10 DIAGNOSIS — Z0000 Encounter for general adult medical examination without abnormal findings: Secondary | ICD-10-CM | POA: Diagnosis not present

## 2015-05-10 DIAGNOSIS — R9431 Abnormal electrocardiogram [ECG] [EKG]: Secondary | ICD-10-CM | POA: Diagnosis not present

## 2015-05-10 DIAGNOSIS — I10 Essential (primary) hypertension: Secondary | ICD-10-CM | POA: Diagnosis not present

## 2015-05-10 DIAGNOSIS — E785 Hyperlipidemia, unspecified: Secondary | ICD-10-CM

## 2015-05-10 DIAGNOSIS — Z9081 Acquired absence of spleen: Secondary | ICD-10-CM

## 2015-05-10 DIAGNOSIS — R972 Elevated prostate specific antigen [PSA]: Secondary | ICD-10-CM

## 2015-05-10 DIAGNOSIS — D693 Immune thrombocytopenic purpura: Secondary | ICD-10-CM

## 2015-05-10 NOTE — Patient Instructions (Signed)
  Continue lifestyle intervention healthy eating and exercise . Healthy lifestyle includes : At least 150 minutes of exercise weeks  , weight at healthy levels, which is usually   BMI 19-25. Avoid trans fats and processed foods;  Increase fresh fruits and veges to 5 servings per day. And avoid sweet beverages including tea and juice. Mediterranean diet with olive oil and nuts have been noted to be heart and brain healthy . Avoid tobacco products . Limit  alcohol to  7 per week for women and 14 servings for men.  Get adequate sleep . Wear seat belts . Don't text and drive .   ekg looks no change but will review   If progressive sob or other sx get back with [place] . Dec alcohol . No change in meds .  Check into shingles vaccine ( Zostavax) reimbursement or cost to you  and can return at any time if call ahead for injection.

## 2015-05-10 NOTE — Progress Notes (Signed)
Pre visit review using our clinic review tool, if applicable. No additional management support is needed unless otherwise documented below in the visit note.  Chief Complaint  Patient presents with  . Annual Exam    medications  . Hypertension    HPI: Patient  Scott Waters  59 y.o. comes in today for Preventive Health Care visit  Has a form for wellness documentation risk . HT usually good   No change in meds  ITP: no active bleeding sees heme  Monitoring  Per heme. Upon ? Has had  Anintermittent burning cp  Without assoc sx off and on usually at work  With exertion no nvsob sweaing  Ins on ppi  For gi sx  No cough wheezing pnd   Health Maintenance  Topic Date Due  . ZOSTAVAX  02/20/2014  . COLONOSCOPY  12/19/2015  . TETANUS/TDAP  03/15/2019  . HIV Screening  Completed   Health Maintenance Review LIFESTYLE:  Exercise:  Work  40 hours  Tobacco/ETS:noAlcohol:  2-4 per day  Sometimes .  Sugar beverages:gatorade once  Sleep:ok Drug use: no Colonoscopy:  utd  :   ROS:  GEN/ HEENT: No fever, significant weight changes sweats headaches vision problems hearing changes, CV/ PULM;  Burning  Mid cp at work with exertion but no ass [place] and  On protonix for gi sx no edema shortness of breath cough, syncope,edema  change in exercise tolerance. GI /GU: No adominal pain, vomiting, change in bowel habits. No blood in the stool. No significant GU symptoms. Nocturia once or none SKIN/HEME: ,no acute skin rashes suspicious lesions or bleeding. No lymphadenopathy, nodules, masses.  NEURO/ PSYCH:  No neurologic signs such as weakness numbness. No depression anxiety. IMM/ Allergy: No unusual infections.  Allergy .   REST of 12 system review negative except as per HPI   Past Medical History  Diagnosis Date  . Seasonal allergies   . Hypertension   . Hyperlipidemia   . Hx of colonic polyps 2007  . Gout 04/27/2011    One episode of joint pain at [facility]   Presumed    . Abnormal LFTs 04/27/2011    . Thrombocytopenia 04/15/2014    admitted for IVIG  . GERD (gastroesophageal reflux disease)     "mild"  . Osteoarthritis   . Arthritis     "knees" (04/16/2014)  . 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 Lower leg edema   . Anxiety   . Depression   . Urinary frequency   . H/O splenectomy     Past Surgical History  Procedure Laterality Date  . Polypectomy    . Multiple tooth extractions  2014    "took out all the top teeth"  . Tonsillectomy and adenoidectomy  1973  . Laceration repair Right ~ 1983    "inner knee; steel tubing fell on me"  . Shoulder arthroscopy Left 08/2001    "removed bone spur"  . Laparoscopic splenectomy N/A 11/08/2014    Procedure: LAPAROSCOPIC SPLENECTOMY;  Surgeon: Fanny Skates, MD;  Location: [facility];  Service: General;  Laterality: N/A;  . Laparotomy N/A 11/14/2014    Procedure: EXPLORATORY LAPAROTOMY, RELEASE SMALL BOWEL OBSTRUCTION, INCISIONAL HERNIA REPAIR;  Surgeon: Fanny Skates, MD;  Location: [facility];  Service: General;  Laterality: N/A;    Family History  Problem Relation Age of Onset  . Colon cancer Father 30    Died at 45  . Hypertension Mother   . Diabetes Mother   . Kidney disease Mother  diailysis from dm   . Stroke      History   Social History  . Marital Status: Divorced    Spouse Name: N/A  . Number of Children: N/A  . Years of Education: N/A   Social History Main Topics  . Smoking status: Former Smoker -- 1.00 packs/day for 33 years    Types: Cigarettes    Start date: 10/02/1970    Quit date: 01/02/2003  . Smokeless tobacco: Former Systems developer    Types: Snuff, Sarina Ser    Quit date: 10/03/2003     Comment: quit 11 years ago  . Alcohol Use: 16.8 oz/week    14 Cans of beer, 14 Shots of liquor per week     Comment: 2 beers daily, 2 shots liquor daily   . Drug Use: Yes     Comment: "last drug use was in ~ 1995; marijuana"  . Sexual Activity: Yes   Other Topics Concern  . 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 amLODipine-olmesartan (AZOR) 10-40 MG per tablet Take 1 tablet by mouth every morning.     Marland Kitchen CINNAMON PO Take 1,000 mg by mouth every morning.    . fexofenadine (ALLEGRA) 180 MG tablet Take 180 mg by mouth every morning.    . fluticasone (FLONASE) 50 MCG/ACT nasal spray instill 2 sprays into each nostril once daily 16 g 5  . folic acid (FOLVITE) 1 MG tablet Take 1 tablet (1 mg total) by mouth daily. 30 tablet 6  . glucosamine-chondroitin 500-400 MG tablet Take 1 tablet by mouth every morning. 30 tablet 5  . ibuprofen (ADVIL,MOTRIN) 200 MG tablet Take 400 mg by mouth 2 (two) times daily.    . Multiple Vitamin (MULTIVITAMIN WITH MINERALS) TABS tablet Take 1 tablet by mouth every morning.     . pantoprazole (PROTONIX) 40 MG tablet Take 1 tablet (40 mg total) by mouth daily. (Patient taking differently: Take 40 mg by mouth every morning. ) 30 tablet 6  . potassium chloride (K-DUR,KLOR-CON) 10 MEQ tablet take 2 tablets by mouth once daily 60 tablet 5  . Probiotic Product (PROBIOTIC DAILY PO) Take 1 tablet by mouth every morning.     . simvastatin (ZOCOR) 40 MG tablet Take 40 mg by mouth every morning.     . simvastatin (ZOCOR) 40 MG tablet take 1 tablet by mouth once daily 30 tablet 0  . VITAMIN K PO Take 100 mcg by mouth every morning. OTC med     Facility-Administered Medications Prior to Visit  Medication Dose Route Frequency Provider Last Rate Last Dose  . 0.9 %  sodium chloride infusion   Intravenous Once Fanny Skates, MD      . 0.9 %  sodium chloride infusion   Intravenous Once Fanny Skates, MD         EXAM:  BP 120/70 mmHg  Temp(Src) 98.8 F (37.1 C) (Oral)  Ht 5\' 7"  (1.702 m)  Wt 195 lb 14.4 oz (88.86 kg)  BMI 30.68 kg/m2  Body mass  index is 30.68 kg/(m^2).  Physical Exam: Vital signs reviewed [id number]:JJHE is a well-developed well-nourished alert cooperative  gentleman  who appearsr stated age in  no acute distress.  HEENT: normocephalic atraumatic , Eyes: PERRL EOM's full, conjunctiva clear,  glassses Nares: paten,t no deformity discharge or tenderness., Ears: no deformity EAC's clear TMs with normal landmarks. Mouth: clear OP, no lesions, edema.  Moist mucous membranes. Dentition in adequate repair. NECK: supple without masses, thyromegaly or bruits. CHEST/PULM:  Clear to auscultation and percussion breath sounds equal no wheeze , rales or rhonchi. No chest wall deformities or tenderness. CV: PMI is nondisplaced, S1 S2 no gallops, murmurs, rubs. Peripheral pulses are full without delay.No JVD .  ABDOMEN: Bowel sounds normal nontender  No guard or rebound, no hepato splenomegal no CVA tenderness.  No hernia.wellnealed scar midline Extremtities:  No clubbing cyanosis or edema, no acute joint swelling or redness no focal atrophy brace on left knee NEURO:  Oriented x3, cranial nerves 3-12 appear to be intact, no obvious focal weakness,gait within normal limits no abnormal reflexes or asymmetrical SKIN: No acute rashes normal turgor, color,g or petechiae. fading bruise left forearm. No hematoma( says hit it a couple times) PSYCH: Oriented, good eye contact, no obvious depression anxiety, cognition and judgment appear normal. LN: no cervical axillary inguinal adenopathy Rectal prostate nl 1+ no nodules stool heme negative   Lab Results  Component Value Date   WBC 4.4 05/03/2015   HGB 12.7* 05/03/2015   HCT 39.1 05/03/2015   PLT 101.0* 05/03/2015   GLUCOSE 84 05/03/2015   CHOL 166 05/03/2015   TRIG 182.0* 05/03/2015   HDL 44.20 05/03/2015   LDLDIRECT 73.0 04/13/2010   LDLCALC 85 05/03/2015   ALT 33 05/03/2015   AST 32 05/03/2015   NA 140 05/03/2015   K 3.9 05/03/2015   CL 105 05/03/2015   CREATININE 0.96 05/03/2015    BUN 15 05/03/2015   CO2 29 05/03/2015   TSH 1.68 05/03/2015   PSA 3.45 05/03/2015   INR 1.00 10/31/2014   BP Readings from Last 3 Encounters:  05/10/15 120/70  04/05/15 140/84  02/01/15 131/74   Wt Readings from Last 3 Encounters:  05/10/15 195 lb 14.4 oz (88.86 kg)  04/05/15 195 lb (88.451 kg)  02/01/15 197 lb (89.359 kg)   ekg nsr compasrison  2012 and poss 2015 loss of anterior forces in v4 alone rest no qs  Otherwise no change .   ASSESSMENT AND PLAN:  Discussed the following assessment and plan:  Visit for preventive health examination - check into shingles vaccine coverage , dec heavy etoh use.   Essential hypertension - Plan: EKG 12-Lead  Hyperlipidemia - at goal   Atypical chest pain - Plan: EKG 12-Lead  Rising PSA level - double baseline in 2 years  nl exam  recheckn in couple months at hem or here  - Plan: PSA  H/O splenectomy  ITP (idiopathic thrombocytopenic purpura)  EKG abnormality ? prevnar 13 next fall  Due  Form completed see instr  Disc pain if  persistent or progressive of any alarm feature need to fu or seek care  He is seems not bothered by sx and not severe    Pt will wait  poss seeing card for eval .   After visit review  Other compare  ekg    Change in v4 forces  No qs . Patient Care Team: Burnis Medin, MD as PCP - General Irene Shipper, MD as Attending Physician (Gastroenterology) [name], MD as Consulting Physician (Oncology) Patient Instructions   Continue lifestyle intervention healthy eating and exercise . Healthy lifestyle includes : At least  150 minutes of exercise weeks  , weight at healthy levels, which is usually   BMI 19-25. Avoid trans fats and processed foods;  Increase fresh fruits and veges to 5 servings per day. And avoid sweet beverages including tea and juice. Mediterranean diet with olive oil and nuts have been noted to be heart and brain healthy . Avoid tobacco products . Limit  alcohol to  7 per week for women  and 14 servings for men.  Get adequate sleep . Wear seat belts . Don't text and drive .   ekg looks no change but will review   If progressive sob or other sx get back with [place] . Dec alcohol . No change in meds .  Check into shingles vaccine ( Zostavax) reimbursement or cost to you  and can return at any time if call ahead for injection.     Standley Brooking. Panosh M.D.

## 2015-05-15 ENCOUNTER — Other Ambulatory Visit: Payer: Self-pay | Admitting: Internal Medicine

## 2015-05-15 DIAGNOSIS — R0789 Other chest pain: Secondary | ICD-10-CM

## 2015-05-20 IMAGING — CT CT ABD-PELV W/ CM
2 of 5 series · 14 of 36 positions shown, 17 images · IV contrast (APPLIED)
Comparison: None.

CLINICAL DATA: Anemia, thrombocytopenia, elevated LDH. Evaluate for
lymphoma.

EXAM:
CT CHEST, ABDOMEN, AND PELVIS WITH CONTRAST
TECHNIQUE: Multidetector CT imaging of the chest, abdomen and pelvis was
performed following the standard protocol during bolus
administration of intravenous contrast.
CONTRAST:  100 mL Omnipaque 300 IV

[Series 3: cap 5.0 i31f 1 · axial · 0.76mm/px · z∈[-640,-95]mm · 11 of 127 slices shown, 14 images]
[im 9/127  mediastinal]
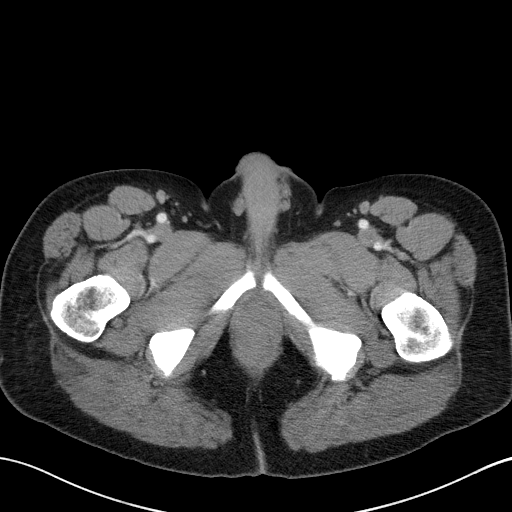
[im 9/127  lung]
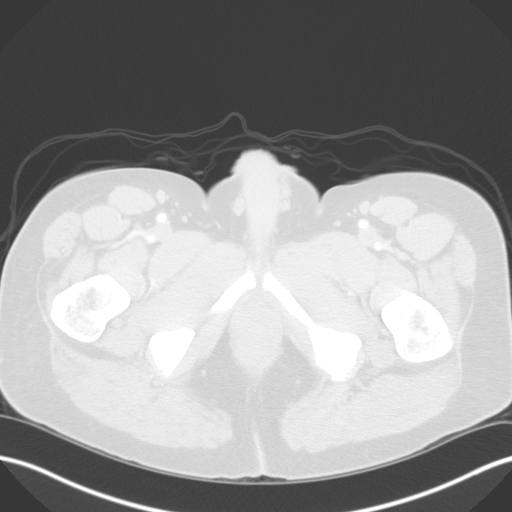
[im 17/127  lung]
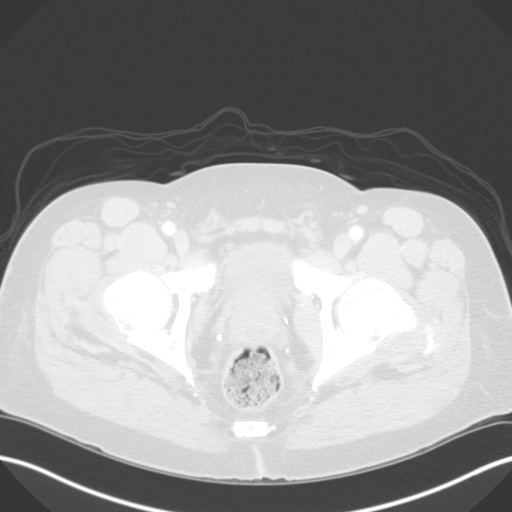
[im 34/127  lung]
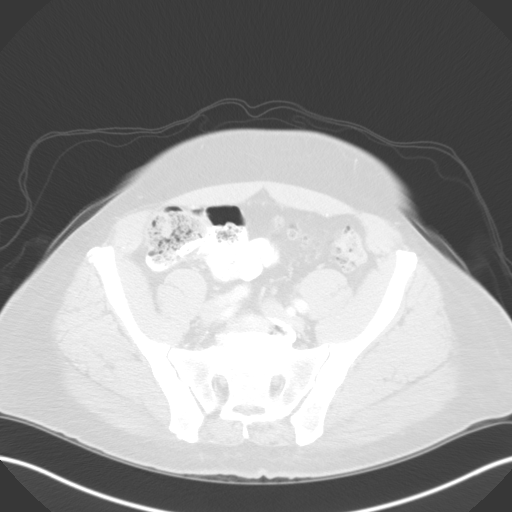
[im 43/127  lung]
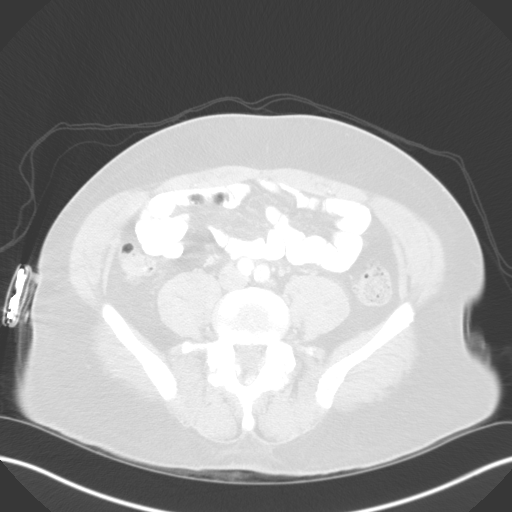
[im 51/127  mediastinal]
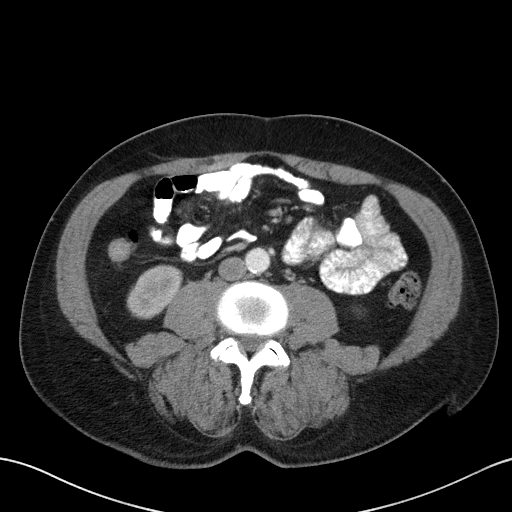
[im 51/127  lung]
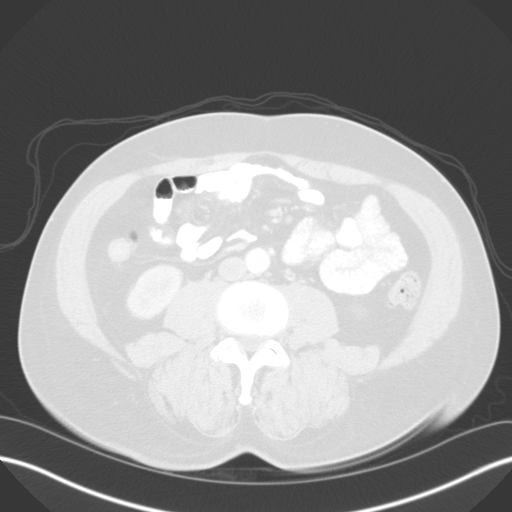
[im 68/127  lung]
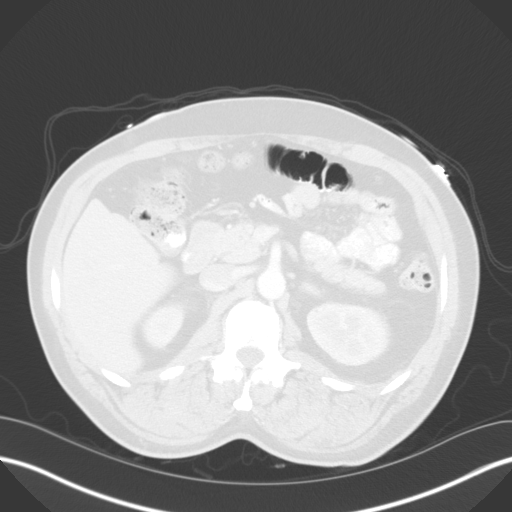
[im 76/127  lung]
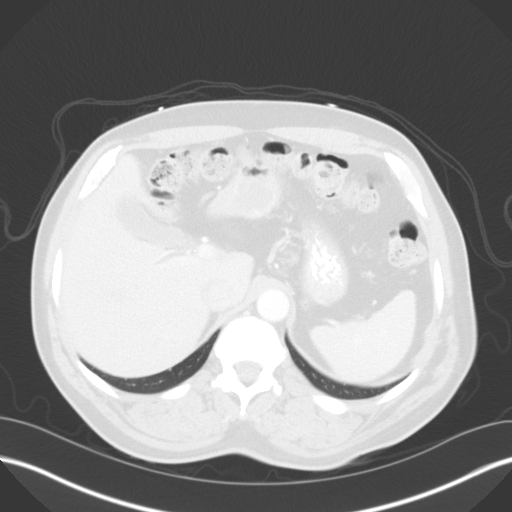
[im 85/127  lung]
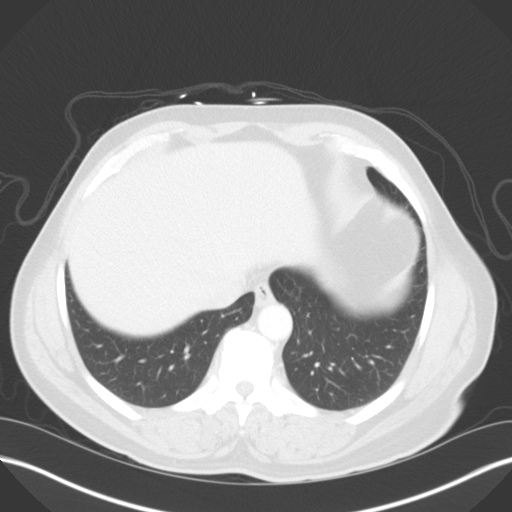
[im 93/127  mediastinal]
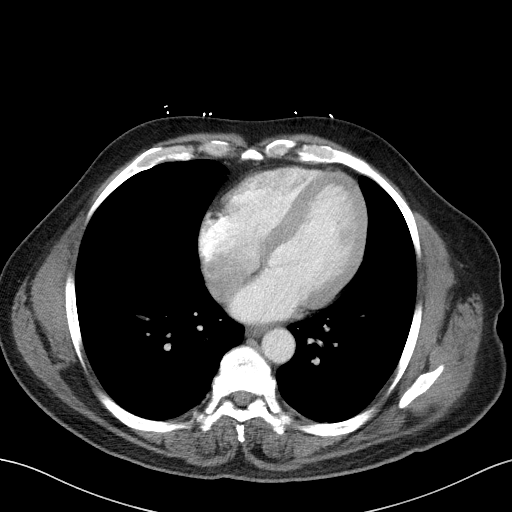
[im 93/127  lung]
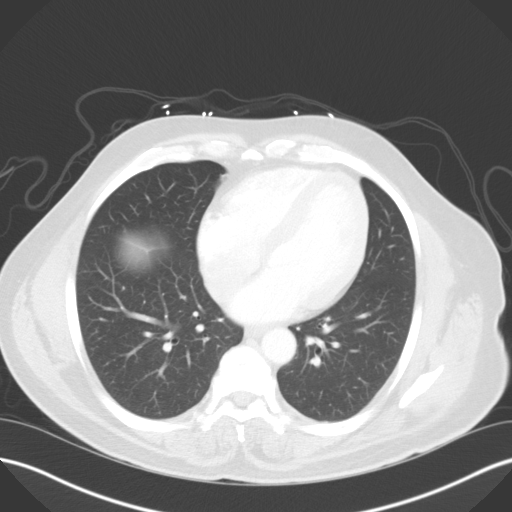
[im 110/127  lung]
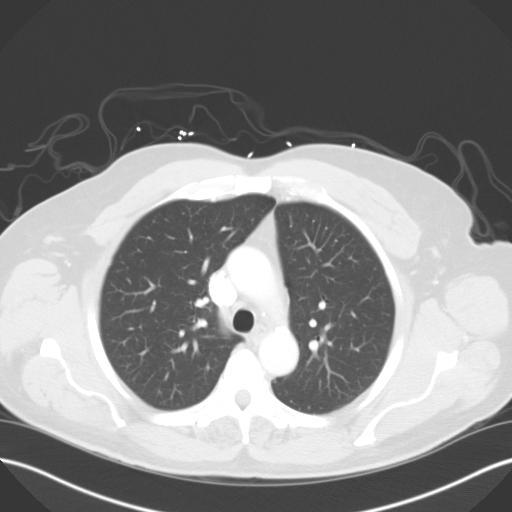
[im 118/127  lung]
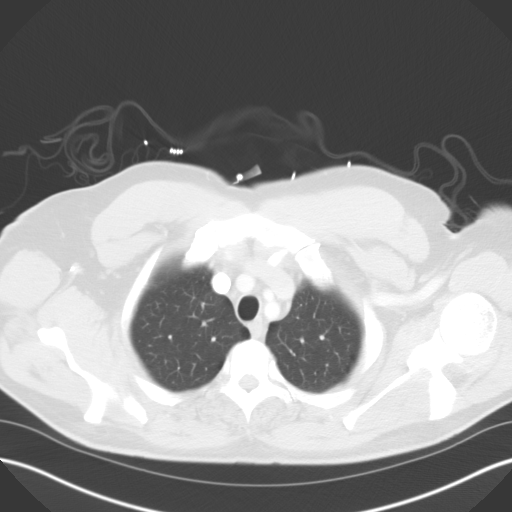

[Series 6: coronal · coronal · 0.86mm/px · 3 of 82 slices shown]
[im 17/82  lung]
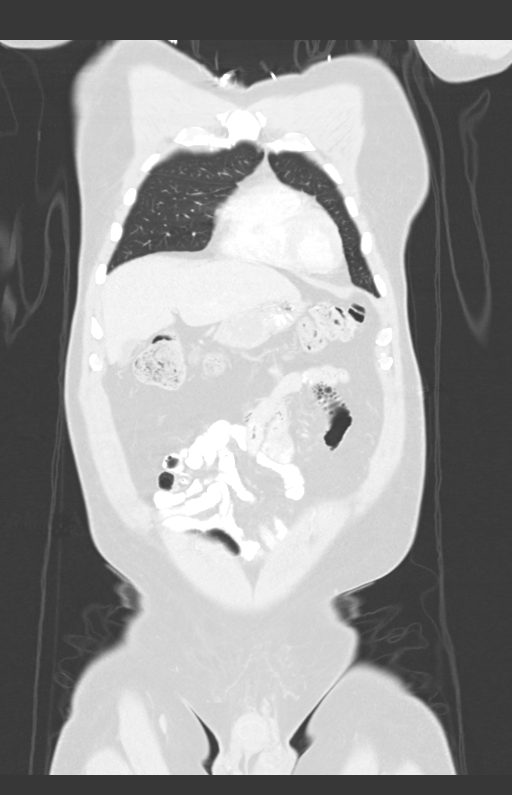
[im 33/82  lung]
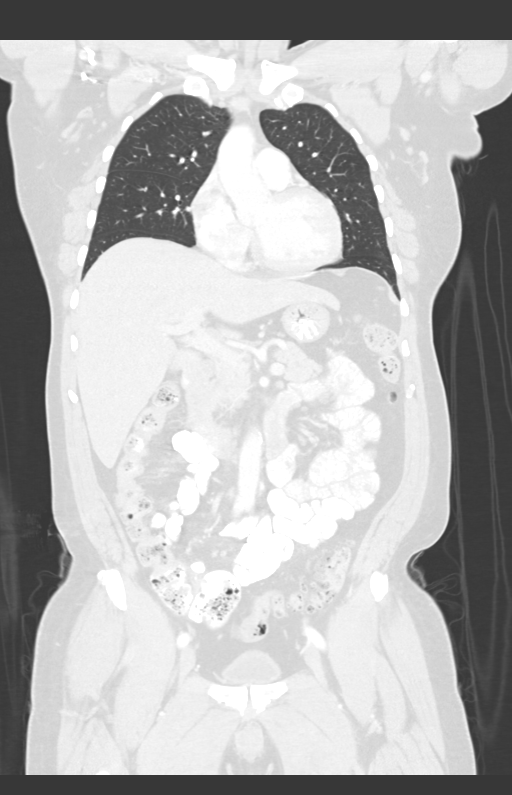
[im 49/82  lung]
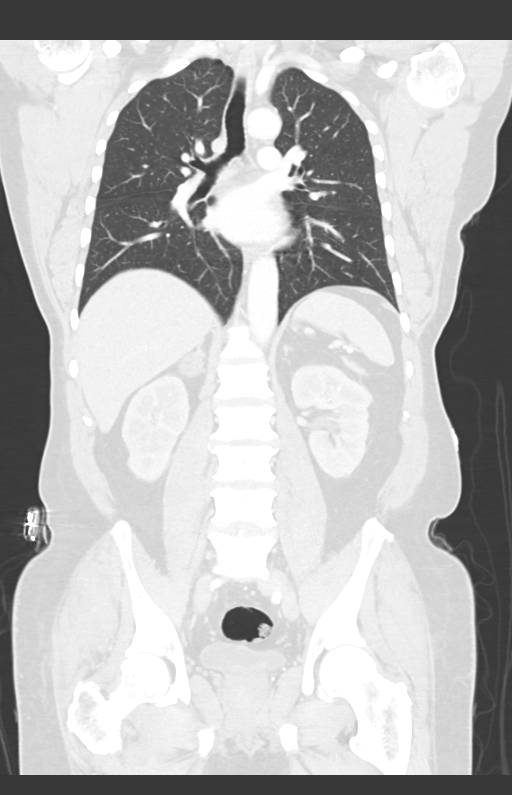

[14 of 36 positions shown; findings below may reference images not displayed]

FINDINGS: CT CHEST FINDINGS

Lungs are clear. No suspicious pulmonary nodules. No pleural
effusion or pneumothorax.

Visualized thyroid is unremarkable.

The heart is normal in size. No pericardial effusion. Coronary
atherosclerosis. Mild atherosclerotic calcifications of the aortic
arch.

No suspicious mediastinal or hilar lymphadenopathy. Small bilateral
axillary nodes measuring up to 8 mm short axis, likely within normal
limits.

Mild degenerative changes of the thoracic spine.

Sclerosis involving the T9 vertebral body (sagittal image 53) and
likely extending into the posterior elements (series 3/image 38).
Although not definitely expansile, the coarsened trabecular
architecture favors Paget's disease.

CT ABDOMEN AND PELVIS FINDINGS

Liver, pancreas, and left adrenal gland is within normal limits.

2.2 x 2.4 cm right adrenal nodule with macroscopic fat (series
3/image 57), suggesting a benign adrenal myelolipoma.

Spleen is normal in size.

Gallbladder is unremarkable. No intrahepatic or extrahepatic ductal
dilatation.

Tiny interpolar left renal cyst (series 3/ image 56). Right kidney
is within normal limits. No hydronephrosis.

No evidence of bowel obstruction. Normal appendix. Colonic
diverticulosis, without associated inflammatory changes.

Atherosclerotic calcifications of the abdominal aorta and branch
vessels.

No abdominopelvic ascites.

Small retroperitoneal lymph nodes measuring 6-7 mm short axis,
within normal limits.

Prostate is unremarkable.

Bladder is within normal limits.

Grade 1 spondylolisthesis at L5-S1. Mild degenerative changes of the
visualized lumbar spine.
IMPRESSION: No findings suspicious for lymphoma in the chest, abdomen, or
pelvis.

2.4 cm probable right adrenal myelolipoma, benign.

Sclerosis involving the T9 vertebral body, favored to reflect
Paget's disease.

## 2015-05-21 ENCOUNTER — Other Ambulatory Visit: Payer: Self-pay | Admitting: Internal Medicine

## 2015-05-22 NOTE — Telephone Encounter (Signed)
Sent to the pharmacy by e-scribe. 

## 2015-05-27 IMAGING — CT CT BIOPSY
2 of 3 series · 4 of 10 positions shown, 7 images · non-contrast
Comparison: none

CLINICAL DATA: Thrombocytopenia and need for bone marrow biopsy.

[Series 4: add scan 5.0 b70f · axial · 0.74mm/px · z∈[-167,-162]mm · 2 of 4 slices shown, 5 images (1 of 2)]
[im 2/4  soft-tissue]
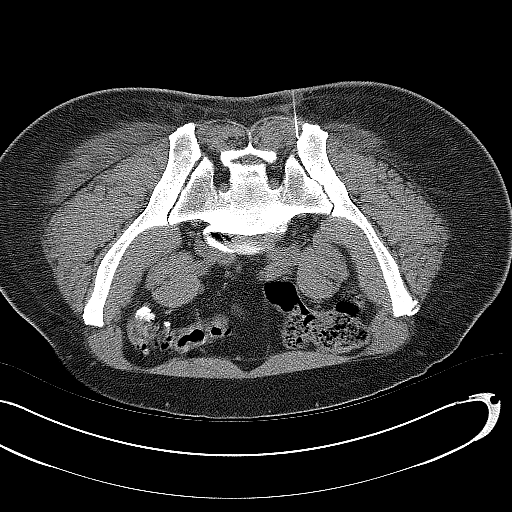
[im 2/4  lung]
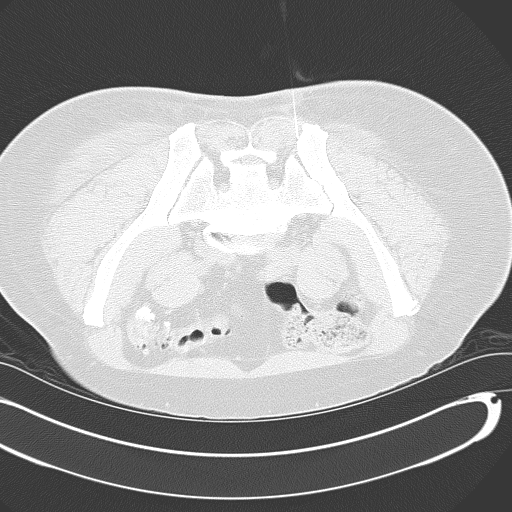
[im 2/4  bone]
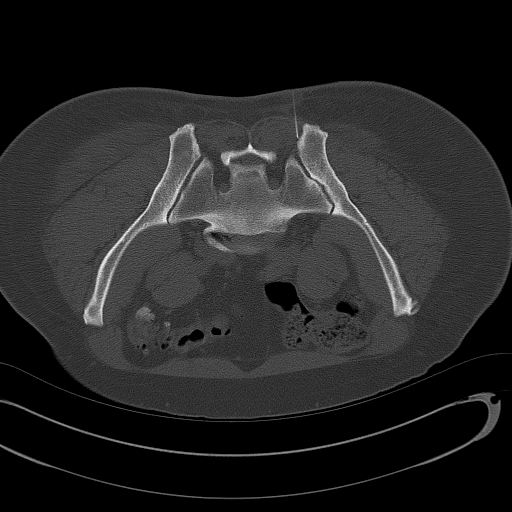
[im 3/4  soft-tissue]
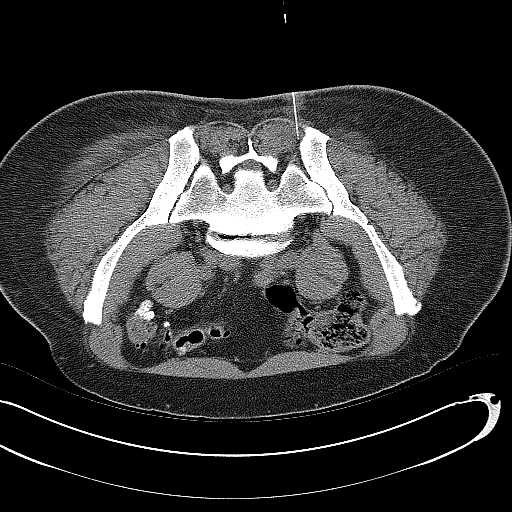
[im 3/4  lung]
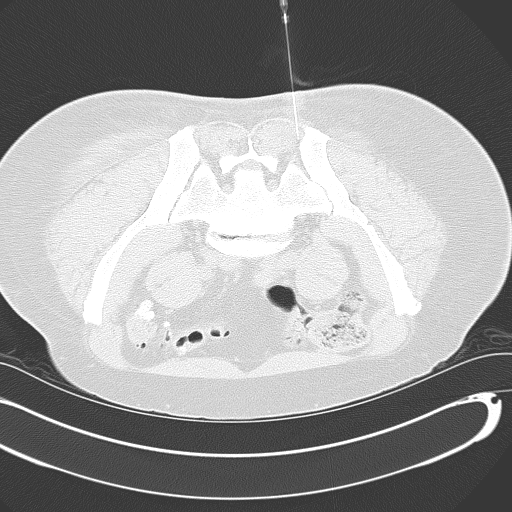

[Series 5: add scan 5.0 b70f · axial · 0.74mm/px · z∈[-167,-162]mm · 2 of 4 slices shown (2 of 2)]
[im 2/4  soft-tissue]
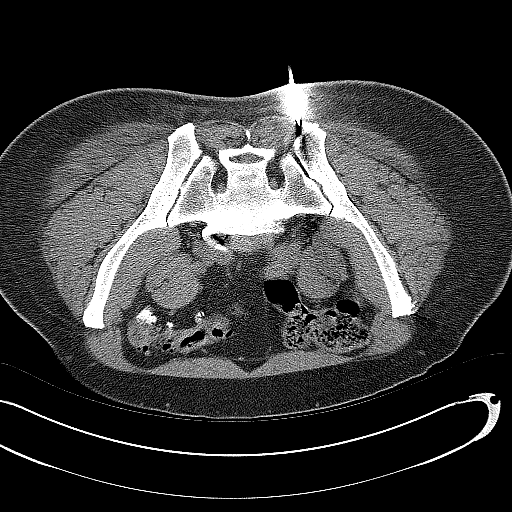
[im 3/4  soft-tissue]
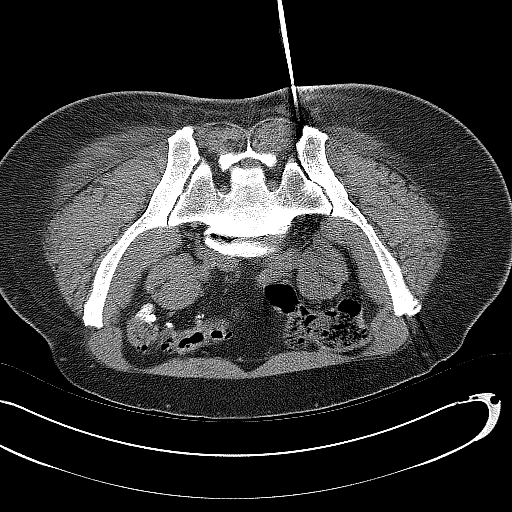

[4 of 10 positions shown; findings below may reference images not displayed]

EXAM:
CT GUIDED CORE BIOPSY OF RIGHT ILIAC BONE MARROW

ANESTHESIA/SEDATION:
3.0  Mg IV Versed; 100 mcg IV Fentanyl

Total Moderate Sedation Time: 10 minutes.

PROCEDURE:
The procedure risks, benefits, and alternatives were explained to
the patient. Questions regarding the procedure were encouraged and
answered. The patient understands and consents to the procedure.

The right gluteal region was prepped with Betadinein a sterile
fashion, and a sterile drape was applied covering the operative
field. A sterile gown and sterile gloves were used for the
procedure. Local anesthesia was provided with 1% Lidocaine.

CT was performed in a prone position. Under CT guidance, an 11 gauge
OnControl hip bone cutting needle was advanced into the right iliac
bone. Non-heparinized then heparinized aspirates were obtained. Core
biopsy was then obtained with the outer needle.

COMPLICATIONS:
None
FINDINGS: Adequate particles were noted in the initial aspirate sample. Solid
core biopsy sample was obtained.
IMPRESSION: CT-guided bone marrow biopsy performed of the right iliac bone.
Aspirate and core samples were obtained.

## 2015-05-29 ENCOUNTER — Other Ambulatory Visit: Payer: Self-pay | Admitting: Internal Medicine

## 2015-05-31 NOTE — Telephone Encounter (Signed)
Sent to the pharmacy by e-scribe. 

## 2015-06-20 ENCOUNTER — Ambulatory Visit (INDEPENDENT_AMBULATORY_CARE_PROVIDER_SITE_OTHER): Payer: BLUE CROSS/BLUE SHIELD | Admitting: Cardiology

## 2015-06-20 ENCOUNTER — Encounter: Payer: Self-pay | Admitting: Cardiology

## 2015-06-20 VITALS — BP 120/66 | HR 93 | Ht 67.0 in | Wt 194.8 lb

## 2015-06-20 DIAGNOSIS — E785 Hyperlipidemia, unspecified: Secondary | ICD-10-CM

## 2015-06-20 DIAGNOSIS — R0789 Other chest pain: Secondary | ICD-10-CM | POA: Diagnosis not present

## 2015-06-20 DIAGNOSIS — R079 Chest pain, unspecified: Secondary | ICD-10-CM | POA: Diagnosis not present

## 2015-06-20 DIAGNOSIS — I10 Essential (primary) hypertension: Secondary | ICD-10-CM

## 2015-06-20 NOTE — Patient Instructions (Signed)
Medication Instructions:  Your physician recommends that you continue on your current medications as directed. Please refer to the Current Medication list given to you today.   Labwork: None  Testing/Procedures: Dr. Radford Pax recommends you have a STRESS MYOVIEW.  Follow-Up: Your physician recommends that you schedule a follow-up appointment AS NEEDED with Dr. Radford Pax pending your study results.  Any Other Special Instructions Will Be Listed Below (If Applicable).

## 2015-06-20 NOTE — Progress Notes (Signed)
Cardiology Office Note   Date:  06/20/2015   ID:  Scott Waters, DOB 1956/11/07, MRN [id number]  PCP:  [name], MD    Chief Complaint  Patient presents with  . New Evaluation    Atypical chest pain      History of Present Illness: Scott Waters is a 59 y.o. male who presents for chest pain.  He describes it as a burning sensation in the midsternal region of his chest with no radiation to arms or neck.  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  It usually lasts 5-10 minutes and resolves with him slowing down.  He denies any SOB, nausea, diaphoresis with the pain and no DOE.  He denies any LE edema, dizziness,, palpitations or syncope.  There is a family history of MI but advanced in age (31 at 37).  He used to smoke but quit about 2 years ago.      Past Medical History  Diagnosis Date  . Seasonal allergies   . Hypertension   . Hyperlipidemia   . Hx of colonic polyps 2007  . Gout 04/27/2011    One episode of joint pain at [facility]   Presumed    . Abnormal LFTs 04/27/2011  . Thrombocytopenia 04/15/2014    admitted for IVIG  . GERD (gastroesophageal reflux disease)     "mild"  . Osteoarthritis   . Arthritis     "knees" (04/16/2014)  . 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 Lower leg edema   . Anxiety   . Depression   . Urinary frequency   . H/O splenectomy     Past Surgical History  Procedure Laterality Date  . Polypectomy    . Multiple tooth extractions  2014    "took out all the top teeth"  . Tonsillectomy and adenoidectomy  1973  . Laceration repair Right ~ 1983    "inner knee; steel tubing fell on me"  . Shoulder arthroscopy Left 08/2001    "removed bone spur"  . Laparoscopic splenectomy N/A 11/08/2014    Procedure: LAPAROSCOPIC SPLENECTOMY;  Surgeon: Fanny Skates, MD;  Location: [facility];  Service: General;  Laterality: N/A;  . Laparotomy N/A 11/14/2014    Procedure:  EXPLORATORY LAPAROTOMY, RELEASE SMALL BOWEL OBSTRUCTION, INCISIONAL HERNIA REPAIR;  Surgeon: Fanny Skates, MD;  Location: [facility];  Service: General;  Laterality: N/A;     Current Outpatient Prescriptions  Medication Sig Dispense Refill  . amLODipine-olmesartan (AZOR) 10-40 MG per tablet Take 1 tablet by mouth every morning.     Marland Kitchen CINNAMON PO Take 1,000 mg by mouth every morning.    . fexofenadine (ALLEGRA) 180 MG tablet Take 180 mg by mouth every morning.    . fluticasone (FLONASE) 50 MCG/ACT nasal spray instill 2 sprays into each nostril once daily 16 g 5  . folic acid (FOLVITE) 1 MG tablet Take 1 tablet (1 mg total) by mouth daily. 30 tablet 6  . glucosamine-chondroitin 500-400 MG tablet Take 1 tablet by mouth every morning. 30 tablet 5  . ibuprofen (ADVIL,MOTRIN) 200 MG tablet Take 400 mg by mouth 2 (two) times daily.    . Multiple Vitamin (MULTIVITAMIN WITH MINERALS) TABS tablet Take 1 tablet by mouth every morning.     . pantoprazole (PROTONIX) 40 MG tablet Take 1 tablet (40 mg total) by mouth daily. 30 tablet 6  .  potassium chloride (K-DUR,KLOR-CON) 10 MEQ tablet take 2 tablets by mouth once daily 60 tablet 10  . Probiotic Product (PROBIOTIC DAILY PO) Take 1 tablet by mouth every morning.     . simvastatin (ZOCOR) 40 MG tablet Take 40 mg by mouth every morning.     Marland Kitchen VITAMIN K PO Take 100 mcg by mouth every morning. OTC med     No current facility-administered medications for this visit.   Facility-Administered Medications Ordered in Other Visits  Medication Dose Route Frequency Provider Last Rate Last Dose  . 0.9 %  sodium chloride infusion   Intravenous Once Fanny Skates, MD      . 0.9 %  sodium chloride infusion   Intravenous Once Fanny Skates, MD        Allergies:   Review of patient's allergies indicates no known allergies.    Social History:  The patient  reports that he quit smoking about 12 years ago. His smoking use included Cigarettes. He started smoking about 44  years ago. He has a 33 pack-year smoking history. He quit smokeless tobacco use about 11 years ago. His smokeless tobacco use included Snuff and Chew. He reports that he drinks about 16.8 oz of alcohol per week. He reports that he uses illicit drugs.   Family History:  The patient's family history includes Colon cancer (age of onset: 25) in his father; Diabetes in his mother; Hypertension in his mother; Kidney disease in his mother; Stroke in an other family member.    ROS:  Please see the history of present illness.   Otherwise, review of systems are positive for none.   All other systems are reviewed and negative.    PHYSICAL EXAM: VS:  BP 120/66 mmHg  Pulse 93  Ht 5\' 7"  (1.702 m)  Wt 194 lb 12.8 oz (88.361 kg)  BMI 30.50 kg/m2  SpO2 96% , BMI Body mass index is 30.5 kg/(m^2). GEN: Well nourished, well developed, in no acute distress HEENT: normal Neck: no JVD, carotid bruits, or masses Cardiac: RRR; no murmurs, rubs, or gallops,no edema  Respiratory:  clear to auscultation bilaterally, normal work of breathing GI: soft, nontender, nondistended, + BS MS: no deformity or atrophy Skin: warm and dry, no rash Neuro:  Strength and sensation are intact Psych: euthymic mood, full affect   EKG:  EKG is not ordered today.   Recent Labs: 05/03/2015: ALT 33; BUN 15; Creatinine, Ser 0.96; Hemoglobin 12.7*; Platelets 101.0*; Potassium 3.9; Sodium 140; TSH 1.68    Lipid Panel    Component Value Date/Time   CHOL 166 05/03/2015 0827   TRIG 182.0* 05/03/2015 0827   HDL 44.20 05/03/2015 0827   CHOLHDL 4 05/03/2015 0827   VLDL 36.4 05/03/2015 0827   LDLCALC 85 05/03/2015 0827   LDLDIRECT 73.0 04/13/2010 0855      Wt Readings from Last 3 Encounters:  06/20/15 194 lb 12.8 oz (88.361 kg)  05/10/15 195 lb 14.4 oz (88.86 kg)  04/05/15 195 lb (88.451 kg)      ASSESSMENT AND PLAN:  1.  Chest pain with typical and atypical features.  His EKG now has some new nonspecific ST abnormalities  in the inferior leads.  His risk factors include remote tobacco use, HTN and dyslipidemia. I will get a stress myoview to rule out ischemia. 2. HTN - controlled on Azor 3.  Dyslipidemia - controlled on statin   Current medicines are reviewed at length with the patient today.  The patient does not have concerns regarding  medicines.  The following changes have been made:  no change  Labs/ tests ordered today: See above Assessment and Plan No orders of the defined types were placed in this encounter.     Disposition:   FU with me PRN pending results of stress test  SignedSueanne Margarita, MD  06/20/2015 4:11 PM    [facility] [address], [place], [place]  [address] Phone: [phone]; Fax: [phone]

## 2015-07-05 ENCOUNTER — Ambulatory Visit (HOSPITAL_BASED_OUTPATIENT_CLINIC_OR_DEPARTMENT_OTHER): Payer: BLUE CROSS/BLUE SHIELD | Admitting: Family

## 2015-07-05 ENCOUNTER — Other Ambulatory Visit (HOSPITAL_BASED_OUTPATIENT_CLINIC_OR_DEPARTMENT_OTHER): Payer: BLUE CROSS/BLUE SHIELD

## 2015-07-05 ENCOUNTER — Telehealth (HOSPITAL_COMMUNITY): Payer: Self-pay | Admitting: *Deleted

## 2015-07-05 ENCOUNTER — Telehealth (HOSPITAL_COMMUNITY): Payer: Self-pay | Admitting: Radiology

## 2015-07-05 ENCOUNTER — Encounter: Payer: Self-pay | Admitting: Family

## 2015-07-05 ENCOUNTER — Ambulatory Visit (HOSPITAL_BASED_OUTPATIENT_CLINIC_OR_DEPARTMENT_OTHER): Payer: BLUE CROSS/BLUE SHIELD

## 2015-07-05 VITALS — BP 135/75 | HR 86 | Temp 97.9°F | Resp 16 | Ht 67.0 in | Wt 194.0 lb

## 2015-07-05 DIAGNOSIS — D693 Immune thrombocytopenic purpura: Secondary | ICD-10-CM

## 2015-07-05 LAB — CBC WITH DIFFERENTIAL (CANCER CENTER ONLY)
BASO#: 0 10*3/uL (ref 0.0–0.2)
BASO%: 0.6 % (ref 0.0–2.0)
EOS%: 2 % (ref 0.0–7.0)
Eosinophils Absolute: 0.1 10*3/uL (ref 0.0–0.5)
HEMATOCRIT: 38 % — AB (ref 38.7–49.9)
HEMOGLOBIN: 13 g/dL (ref 13.0–17.1)
LYMPH#: 1.7 10*3/uL (ref 0.9–3.3)
LYMPH%: 27.2 % (ref 14.0–48.0)
MCH: 30.2 pg (ref 28.0–33.4)
MCHC: 34.2 g/dL (ref 32.0–35.9)
MCV: 88 fL (ref 82–98)
MONO#: 1 10*3/uL — ABNORMAL HIGH (ref 0.1–0.9)
MONO%: 15.3 % — ABNORMAL HIGH (ref 0.0–13.0)
NEUT#: 3.5 10*3/uL (ref 1.5–6.5)
NEUT%: 54.9 % (ref 40.0–80.0)
Platelets: <6 10*3/uL — CL (ref 145–400)
RBC: 4.3 10*6/uL (ref 4.20–5.70)
RDW: 15.4 % (ref 11.1–15.7)
WBC: 6.4 10*3/uL (ref 4.0–10.0)

## 2015-07-05 LAB — TECHNOLOGIST REVIEW CHCC SATELLITE

## 2015-07-05 LAB — CHCC SATELLITE - SMEAR

## 2015-07-05 MED ORDER — ROMIPLOSTIM INJECTION 500 MCG
5.0000 ug/kg | Freq: Once | SUBCUTANEOUS | Status: AC
  Administered 2015-07-05: 450 ug via SUBCUTANEOUS
  Filled 2015-07-05: qty 0.9

## 2015-07-05 NOTE — Telephone Encounter (Signed)
Left message on voicemail in reference to upcoming appointment scheduled for 07/10/15. Phone number given for a call back so details instructions can be given. Araceli Coufal, Ranae Palms

## 2015-07-05 NOTE — Telephone Encounter (Signed)
Patient given detailed instructions per Myocardial Perfusion Study Information Sheet for test on 07/10/15 at 0900. Patient Notified to arrive 15 minutes early, and that it is imperative to arrive on time for appointment to keep from having the test rescheduled. Patient verbalized understanding. Anakin Varkey, Ranae Palms

## 2015-07-05 NOTE — Patient Instructions (Signed)
Romiplostim injection What is this medicine? ROMIPLOSTIM (roe mi PLOE stim) helps your body make more platelets. This medicine is used to treat low platelets caused by chronic idiopathic thrombocytopenic purpura (ITP). This medicine may be used for other purposes; ask your health care provider or pharmacist if you have questions. COMMON BRAND NAME(S): Nplate What should I tell my health care provider before I take this medicine? They need to know if you have any of these conditions: -cancer or myelodysplastic syndrome -low blood counts, like low white cell, platelet, or red cell counts -take medicines that treat or prevent blood clots -an unusual or allergic reaction to romiplostim, mannitol, other medicines, foods, dyes, or preservatives -pregnant or trying to get pregnant -breast-feeding How should I use this medicine? This medicine is for injection under the skin. It is given by a health care professional in a hospital or clinic setting. A special MedGuide will be given to you before your injection. Read this information carefully each time. Talk to your pediatrician regarding the use of this medicine in children. Special care may be needed. Overdosage: If you think you have taken too much of this medicine contact a poison control center or emergency room at once. NOTE: This medicine is only for you. Do not share this medicine with others. What if I miss a dose? It is important not to miss your dose. Call your doctor or health care professional if you are unable to keep an appointment. What may interact with this medicine? Interactions are not expected. This list may not describe all possible interactions. Give your health care provider a list of all the medicines, herbs, non-prescription drugs, or dietary supplements you use. Also tell them if you smoke, drink alcohol, or use illegal drugs. Some items may interact with your medicine. What should I watch for while using this  medicine? Your condition will be monitored carefully while you are receiving this medicine. Visit your prescriber or health care professional for regular checks on your progress and for the needed blood tests. It is important to keep all appointments. What side effects may I notice from receiving this medicine? Side effects that you should report to your doctor or health care professional as soon as possible: -allergic reactions like skin rash, itching or hives, swelling of the face, lips, or tongue -shortness of breath, chest pain, swelling in a leg -unusual bleeding or bruising Side effects that usually do not require medical attention (report to your doctor or health care professional if they continue or are bothersome): -dizziness -headache -muscle aches -pain in arms and legs -stomach pain -trouble sleeping This list may not describe all possible side effects. Call your doctor for medical advice about side effects. You may report side effects to FDA at 1-800-FDA-1088. Where should I keep my medicine? This drug is given in a hospital or clinic and will not be stored at home. NOTE: This sheet is a summary. It may not cover all possible information. If you have questions about this medicine, talk to your doctor, pharmacist, or health care provider.  2015, Elsevier/Gold Standard. (2008-07-11 15:13:04)  

## 2015-07-05 NOTE — Progress Notes (Signed)
Hematology and Oncology Follow Up Visit  Scott Waters [id number] 04-24-1956 59 y.o. 07/05/2015   Principle Diagnosis:  Refractory immune thrombocytopenia - s/p splenectomy   Current Therapy:   Nplate as indicated    Interim History:  Scott Waters is here today for a follow-up. He has some bruising on his arms. He also has a fer "blood blisters" on his arms. He has had no episodes of bleeding. His platelet count today is less than 6. He responded nicely to his last dose of Nplate in December 7829. We will give him another dose today.  He did recently experience a "burning" sensation in his chest and has seen cardiology. His work-up was negative for an MI. He is scheduled for a stress test on Monday.  He has had no fever, chills, n/v, cough, rash, dizziness, SOB, palpitations, abdominal pain, constipation, diarrhea, blood in urine or stool.  No lymphadenopathy was found on assessment.  No swelling, tenderness, numbness or tingling in his extremities. No c/o aches or pains at this time.  He is eating well and staying hydrated. His weight is stable and unchanged since his last visit.    Medications:    Medication List       This list is accurate as of: 07/05/15  4:53 PM.  Always use your most recent med list.               AZOR 10-40 MG per tablet  Generic drug:  amLODipine-olmesartan  Take 1 tablet by mouth every morning.     CINNAMON PO  Take 1,000 mg by mouth every morning.     fexofenadine 180 MG tablet  Commonly known as:  ALLEGRA  Take 180 mg by mouth every morning.     fluticasone 50 MCG/ACT nasal spray  Commonly known as:  FLONASE  instill 2 sprays into each nostril once daily     folic acid 1 MG tablet  Commonly known as:  FOLVITE  Take 1 tablet (1 mg total) by mouth daily.     glucosamine-chondroitin 500-400 MG tablet  Take 1 tablet by mouth every morning.     ibuprofen 200 MG tablet  Commonly known as:  ADVIL,MOTRIN  Take 400 mg by mouth 2 (two) times  daily.     multivitamin with minerals Tabs tablet  Take 1 tablet by mouth every morning.     pantoprazole 40 MG tablet  Commonly known as:  PROTONIX  Take 1 tablet (40 mg total) by mouth daily.     potassium chloride 10 MEQ tablet  Commonly known as:  K-DUR,KLOR-CON  take 2 tablets by mouth once daily     PROBIOTIC DAILY PO  Take 1 tablet by mouth every morning.     simvastatin 40 MG tablet  Commonly known as:  ZOCOR  Take 40 mg by mouth every morning.     VITAMIN K PO  Take 100 mcg by mouth every morning. OTC med        Allergies: No Known Allergies  Past Medical History, Surgical history, Social history, and Family History were reviewed and updated.  Review of Systems: All other 10 point review of systems is negative.   Physical Exam:  height is 5\' 7"  (1.702 m) and weight is 194 lb (87.998 kg). His oral temperature is 97.9 F (36.6 C). His blood pressure is 135/75 and his pulse is 86. His respiration is 16.   Wt Readings from Last 3 Encounters:  07/05/15 194 lb (87.998 kg)  06/20/15 194  lb 12.8 oz (88.361 kg)  05/10/15 195 lb 14.4 oz (88.86 kg)    Ocular: Sclerae unicteric, pupils equal, round and reactive to light Ear-nose-throat: Oropharynx clear, dentition fair Lymphatic: No cervical or supraclavicular adenopathy Lungs no rales or rhonchi, good excursion bilaterally Heart regular rate and rhythm, no murmur appreciated Abd soft, nontender, positive bowel sounds MSK no focal spinal tenderness, no joint edema Neuro: non-focal, well-oriented, appropriate affect Breasts: Deferred  Lab Results  Component Value Date   WBC 6.4 07/05/2015   HGB 13.0 07/05/2015   HCT 38.0* 07/05/2015   MCV 88 07/05/2015   PLT <6* 07/05/2015   No results found for: FERRITIN, IRON, TIBC, UIBC, IRONPCTSAT Lab Results  Component Value Date   RETICCTPCT 2.6* 05/13/2014   RBC 4.30 07/05/2015   RETICCTABS 91.0 05/13/2014   No results found for: KPAFRELGTCHN, LAMBDASER,  KAPLAMBRATIO No results found for: IGGSERUM, IGA, IGMSERUM No results found for: Odetta Pink, SPEI   Chemistry      Component Value Date/Time   NA 140 05/03/2015 0827   NA 139 07/20/2014 0902   K 3.9 05/03/2015 0827   K 3.3 07/20/2014 0902   CL 105 05/03/2015 0827   CL 103 07/20/2014 0902   CO2 29 05/03/2015 0827   CO2 27 07/20/2014 0902   BUN 15 05/03/2015 0827   BUN 11 07/20/2014 0902   CREATININE 0.96 05/03/2015 0827   CREATININE 0.9 07/20/2014 0902      Component Value Date/Time   CALCIUM 9.2 05/03/2015 0827   CALCIUM 8.6 07/20/2014 0902   ALKPHOS 113 05/03/2015 0827   ALKPHOS 75 07/20/2014 0902   AST 32 05/03/2015 0827   AST 51* 07/20/2014 0902   ALT 33 05/03/2015 0827   ALT 36 07/20/2014 0902   BILITOT 0.5 05/03/2015 0827   BILITOT 0.80 07/20/2014 0902     Impression and Plan: Scott Waters is 59 yo gentleman with refractory immune thrombocytopenia. His platelet count has dropped to less than 6 with this visit. He has some bruising on his arms but has had no episodes of bleeding.  His Hgb is holding at 13,8 with an MCV of 88. His WBC count is 6.4.  We will give him a dose of Nplate today and then once weekly for 4 weeks. We will also check his CBC before each injection.  We will plan to see him back in 4 weeks for follow-up.  He knows to contact us with any questions or concerns. We can certainly see him sooner if need be.   Eliezer Bottom, NP 8/10/20164:53 PM

## 2015-07-05 NOTE — Telephone Encounter (Signed)
Patient given detailed instructions per Myocardial Perfusion Study Information Sheet for test on 07/10/15 at 9am. Patient Notified to arrive 15 minutes early, and that it is imperative to arrive on time for appointment to keep from having the test rescheduled. Patient verbalized understanding. EHK

## 2015-07-10 ENCOUNTER — Ambulatory Visit (HOSPITAL_COMMUNITY): Payer: BLUE CROSS/BLUE SHIELD | Attending: Cardiology

## 2015-07-10 DIAGNOSIS — R9439 Abnormal result of other cardiovascular function study: Secondary | ICD-10-CM | POA: Diagnosis not present

## 2015-07-10 DIAGNOSIS — Z8249 Family history of ischemic heart disease and other diseases of the circulatory system: Secondary | ICD-10-CM | POA: Insufficient documentation

## 2015-07-10 DIAGNOSIS — R079 Chest pain, unspecified: Secondary | ICD-10-CM

## 2015-07-10 DIAGNOSIS — I10 Essential (primary) hypertension: Secondary | ICD-10-CM | POA: Insufficient documentation

## 2015-07-10 MED ORDER — TECHNETIUM TC 99M SESTAMIBI GENERIC - CARDIOLITE
32.8000 | Freq: Once | INTRAVENOUS | Status: AC | PRN
  Administered 2015-07-10: 32.8 via INTRAVENOUS

## 2015-07-10 MED ORDER — TECHNETIUM TC 99M SESTAMIBI GENERIC - CARDIOLITE
10.8000 | Freq: Once | INTRAVENOUS | Status: AC | PRN
  Administered 2015-07-10: 11 via INTRAVENOUS

## 2015-07-11 LAB — MYOCARDIAL PERFUSION IMAGING
CHL CUP NUCLEAR SDS: 2
CHL CUP NUCLEAR SRS: 0
CSEPED: 6 min
CSEPEW: 7.3 METS
Exercise duration (sec): 15 s
LV dias vol: 115 mL
LV sys vol: 68 mL
MPHR: 159 {beats}/min
NUC STRESS TID: 0.93
Peak HR: 137 {beats}/min
Percent HR: 85 %
RATE: 0.34
RPE: 18
Rest HR: 75 {beats}/min
SSS: 2

## 2015-07-12 ENCOUNTER — Other Ambulatory Visit: Payer: Self-pay | Admitting: *Deleted

## 2015-07-12 ENCOUNTER — Other Ambulatory Visit (HOSPITAL_BASED_OUTPATIENT_CLINIC_OR_DEPARTMENT_OTHER): Payer: BLUE CROSS/BLUE SHIELD

## 2015-07-12 ENCOUNTER — Ambulatory Visit (HOSPITAL_BASED_OUTPATIENT_CLINIC_OR_DEPARTMENT_OTHER): Payer: BLUE CROSS/BLUE SHIELD

## 2015-07-12 VITALS — BP 143/71 | HR 69 | Temp 97.0°F

## 2015-07-12 DIAGNOSIS — D693 Immune thrombocytopenic purpura: Secondary | ICD-10-CM

## 2015-07-12 DIAGNOSIS — D696 Thrombocytopenia, unspecified: Secondary | ICD-10-CM

## 2015-07-12 DIAGNOSIS — Z9081 Acquired absence of spleen: Secondary | ICD-10-CM

## 2015-07-12 LAB — CBC WITH DIFFERENTIAL (CANCER CENTER ONLY)
BASO#: 0 10*3/uL (ref 0.0–0.2)
BASO%: 0.5 % (ref 0.0–2.0)
EOS ABS: 0.1 10*3/uL (ref 0.0–0.5)
EOS%: 0.8 % (ref 0.0–7.0)
HCT: 40.4 % (ref 38.7–49.9)
HEMOGLOBIN: 13.9 g/dL (ref 13.0–17.1)
LYMPH#: 1.7 10*3/uL (ref 0.9–3.3)
LYMPH%: 25.8 % (ref 14.0–48.0)
MCH: 30.2 pg (ref 28.0–33.4)
MCHC: 34.4 g/dL (ref 32.0–35.9)
MCV: 88 fL (ref 82–98)
MONO#: 0.8 10*3/uL (ref 0.1–0.9)
MONO%: 12.5 % (ref 0.0–13.0)
NEUT%: 60.4 % (ref 40.0–80.0)
NEUTROS ABS: 4 10*3/uL (ref 1.5–6.5)
Platelets: <6 10*3/uL — CL (ref 145–400)
RBC: 4.6 10*6/uL (ref 4.20–5.70)
RDW: 15.4 % (ref 11.1–15.7)
WBC: 6.6 10*3/uL (ref 4.0–10.0)

## 2015-07-12 LAB — CHCC SATELLITE - SMEAR

## 2015-07-12 MED ORDER — ROMIPLOSTIM INJECTION 500 MCG
6.0000 ug/kg | Freq: Once | SUBCUTANEOUS | Status: AC
  Administered 2015-07-12: 530 ug via SUBCUTANEOUS
  Filled 2015-07-12: qty 1.06

## 2015-07-12 MED ORDER — FOLIC ACID 1 MG PO TABS: 1.0000 mg | ORAL_TABLET | Freq: Every day | ORAL | Status: DC

## 2015-07-12 NOTE — Patient Instructions (Signed)
Romiplostim injection What is this medicine? ROMIPLOSTIM (roe mi PLOE stim) helps your body make more platelets. This medicine is used to treat low platelets caused by chronic idiopathic thrombocytopenic purpura (ITP). This medicine may be used for other purposes; ask your health care provider or pharmacist if you have questions. COMMON BRAND NAME(S): Nplate What should I tell my health care provider before I take this medicine? They need to know if you have any of these conditions: -cancer or myelodysplastic syndrome -low blood counts, like low white cell, platelet, or red cell counts -take medicines that treat or prevent blood clots -an unusual or allergic reaction to romiplostim, mannitol, other medicines, foods, dyes, or preservatives -pregnant or trying to get pregnant -breast-feeding How should I use this medicine? This medicine is for injection under the skin. It is given by a health care professional in a hospital or clinic setting. A special MedGuide will be given to you before your injection. Read this information carefully each time. Talk to your pediatrician regarding the use of this medicine in children. Special care may be needed. Overdosage: If you think you have taken too much of this medicine contact a poison control center or emergency room at once. NOTE: This medicine is only for you. Do not share this medicine with others. What if I miss a dose? It is important not to miss your dose. Call your doctor or health care professional if you are unable to keep an appointment. What may interact with this medicine? Interactions are not expected. This list may not describe all possible interactions. Give your health care provider a list of all the medicines, herbs, non-prescription drugs, or dietary supplements you use. Also tell them if you smoke, drink alcohol, or use illegal drugs. Some items may interact with your medicine. What should I watch for while using this  medicine? Your condition will be monitored carefully while you are receiving this medicine. Visit your prescriber or health care professional for regular checks on your progress and for the needed blood tests. It is important to keep all appointments. What side effects may I notice from receiving this medicine? Side effects that you should report to your doctor or health care professional as soon as possible: -allergic reactions like skin rash, itching or hives, swelling of the face, lips, or tongue -shortness of breath, chest pain, swelling in a leg -unusual bleeding or bruising Side effects that usually do not require medical attention (report to your doctor or health care professional if they continue or are bothersome): -dizziness -headache -muscle aches -pain in arms and legs -stomach pain -trouble sleeping This list may not describe all possible side effects. Call your doctor for medical advice about side effects. You may report side effects to FDA at 1-800-FDA-1088. Where should I keep my medicine? This drug is given in a hospital or clinic and will not be stored at home. NOTE: This sheet is a summary. It may not cover all possible information. If you have questions about this medicine, talk to your doctor, pharmacist, or health care provider.  2015, Elsevier/Gold Standard. (2008-07-11 15:13:04)  

## 2015-07-19 ENCOUNTER — Telehealth: Payer: Self-pay | Admitting: *Deleted

## 2015-07-19 ENCOUNTER — Ambulatory Visit (HOSPITAL_BASED_OUTPATIENT_CLINIC_OR_DEPARTMENT_OTHER): Payer: BLUE CROSS/BLUE SHIELD | Admitting: Nurse Practitioner

## 2015-07-19 ENCOUNTER — Ambulatory Visit (HOSPITAL_BASED_OUTPATIENT_CLINIC_OR_DEPARTMENT_OTHER): Payer: BLUE CROSS/BLUE SHIELD

## 2015-07-19 VITALS — BP 112/48 | HR 75 | Temp 98.2°F | Resp 18

## 2015-07-19 DIAGNOSIS — Z9081 Acquired absence of spleen: Secondary | ICD-10-CM

## 2015-07-19 DIAGNOSIS — D693 Immune thrombocytopenic purpura: Secondary | ICD-10-CM | POA: Diagnosis not present

## 2015-07-19 LAB — CBC WITH DIFFERENTIAL (CANCER CENTER ONLY)
BASO#: 0 10*3/uL (ref 0.0–0.2)
BASO%: 0.5 % (ref 0.0–2.0)
EOS ABS: 0.1 10*3/uL (ref 0.0–0.5)
EOS%: 1.9 % (ref 0.0–7.0)
HEMATOCRIT: 37.2 % — AB (ref 38.7–49.9)
HGB: 12.7 g/dL — ABNORMAL LOW (ref 13.0–17.1)
LYMPH#: 1.7 10*3/uL (ref 0.9–3.3)
LYMPH%: 28.1 % (ref 14.0–48.0)
MCH: 30.3 pg (ref 28.0–33.4)
MCHC: 34.1 g/dL (ref 32.0–35.9)
MCV: 89 fL (ref 82–98)
MONO#: 0.8 10*3/uL (ref 0.1–0.9)
MONO%: 13.5 % — ABNORMAL HIGH (ref 0.0–13.0)
NEUT#: 3.3 10*3/uL (ref 1.5–6.5)
NEUT%: 56 % (ref 40.0–80.0)
Platelets: <6 10*3/uL — CL (ref 145–400)
RBC: 4.19 10*6/uL — ABNORMAL LOW (ref 4.20–5.70)
RDW: 15.1 % (ref 11.1–15.7)
WBC: 5.9 10*3/uL (ref 4.0–10.0)

## 2015-07-19 MED ORDER — ROMIPLOSTIM INJECTION 500 MCG
7.1000 ug/kg | Freq: Once | SUBCUTANEOUS | Status: AC
  Administered 2015-07-19: 625 ug via SUBCUTANEOUS
  Filled 2015-07-19: qty 1.25

## 2015-07-19 MED ORDER — AMINOCAPROIC ACID 25 % PO SYRP: ORAL_SOLUTION | ORAL | Status: DC

## 2015-07-19 NOTE — Patient Instructions (Signed)
Romiplostim injection What is this medicine? ROMIPLOSTIM (roe mi PLOE stim) helps your body make more platelets. This medicine is used to treat low platelets caused by chronic idiopathic thrombocytopenic purpura (ITP). This medicine may be used for other purposes; ask your health care provider or pharmacist if you have questions. COMMON BRAND NAME(S): Nplate What should I tell my health care provider before I take this medicine? They need to know if you have any of these conditions: -cancer or myelodysplastic syndrome -low blood counts, like low white cell, platelet, or red cell counts -take medicines that treat or prevent blood clots -an unusual or allergic reaction to romiplostim, mannitol, other medicines, foods, dyes, or preservatives -pregnant or trying to get pregnant -breast-feeding How should I use this medicine? This medicine is for injection under the skin. It is given by a health care professional in a hospital or clinic setting. A special MedGuide will be given to you before your injection. Read this information carefully each time. Talk to your pediatrician regarding the use of this medicine in children. Special care may be needed. Overdosage: If you think you have taken too much of this medicine contact a poison control center or emergency room at once. NOTE: This medicine is only for you. Do not share this medicine with others. What if I miss a dose? It is important not to miss your dose. Call your doctor or health care professional if you are unable to keep an appointment. What may interact with this medicine? Interactions are not expected. This list may not describe all possible interactions. Give your health care provider a list of all the medicines, herbs, non-prescription drugs, or dietary supplements you use. Also tell them if you smoke, drink alcohol, or use illegal drugs. Some items may interact with your medicine. What should I watch for while using this  medicine? Your condition will be monitored carefully while you are receiving this medicine. Visit your prescriber or health care professional for regular checks on your progress and for the needed blood tests. It is important to keep all appointments. What side effects may I notice from receiving this medicine? Side effects that you should report to your doctor or health care professional as soon as possible: -allergic reactions like skin rash, itching or hives, swelling of the face, lips, or tongue -shortness of breath, chest pain, swelling in a leg -unusual bleeding or bruising Side effects that usually do not require medical attention (report to your doctor or health care professional if they continue or are bothersome): -dizziness -headache -muscle aches -pain in arms and legs -stomach pain -trouble sleeping This list may not describe all possible side effects. Call your doctor for medical advice about side effects. You may report side effects to FDA at 1-800-FDA-1088. Where should I keep my medicine? This drug is given in a hospital or clinic and will not be stored at home. NOTE: This sheet is a summary. It may not cover all possible information. If you have questions about this medicine, talk to your doctor, pharmacist, or health care provider.  2015, Elsevier/Gold Standard. (2008-07-11 15:13:04)  

## 2015-07-19 NOTE — Telephone Encounter (Signed)
Critical Value PLT <6 Dr [name] notified. Patient here for N-Plate

## 2015-07-19 NOTE — Progress Notes (Signed)
Hematology and Oncology Follow Up Visit  Scott Waters [id number] [birth year] 59 y.o. 07/19/2015   Principle Diagnosis:   Refractory immune thrombocytopenia  Current Therapy:   Status post splenectomy Nplate every week     Interim History:  Mr.  Scott Waters is in for weekly Nplate. For reasons that somehow escaped me, his plate count has dropped incredibly low. I am not sure as to why his blood count has gone down so quickly.  I now have him on weekly and plate. I started this about 3 weeks ago. So far, he really has not had a response.  I would get his blood of the microscope. I do not see any Howell-Jolly bodies. As such, I does wonder if he has developed a new spleen.  I taught him today. I want to get a liver spleen scan on him to see if this cannot uncover a new spleen.  He's had no spontaneous bleeding. He does have some buccal bleeding. I will give him some Amicar mouth rinse.  He's had no hematuria. He's had no hematochezia. He's had no hemoptysis. He does have an occasional epistaxes.  He is still working. He's having no problems at work.   Medications:  Current outpatient prescriptions:  .  amLODipine-olmesartan (AZOR) 10-40 MG per tablet, Take 1 tablet by mouth every morning. , Disp: , Rfl:  .  CINNAMON PO, Take 1,000 mg by mouth every morning., Disp: , Rfl:  .  fexofenadine (ALLEGRA) 180 MG tablet, Take 180 mg by mouth every morning., Disp: , Rfl:  .  fluticasone (FLONASE) 50 MCG/ACT nasal spray, instill 2 sprays into each nostril once daily, Disp: 16 g, Rfl: 5 .  folic acid (FOLVITE) 1 MG tablet, Take 1 tablet (1 mg total) by mouth daily., Disp: 30 tablet, Rfl: 6 .  glucosamine-chondroitin 500-400 MG tablet, Take 1 tablet by mouth every morning., Disp: 30 tablet, Rfl: 5 .  ibuprofen (ADVIL,MOTRIN) 200 MG tablet, Take 400 mg by mouth 2 (two) times daily., Disp: , Rfl:  .  Multiple Vitamin (MULTIVITAMIN WITH MINERALS) TABS tablet, Take 1 tablet by mouth every morning. ,  Disp: , Rfl:  .  pantoprazole (PROTONIX) 40 MG tablet, Take 1 tablet (40 mg total) by mouth daily., Disp: 30 tablet, Rfl: 6 .  potassium chloride (K-DUR,KLOR-CON) 10 MEQ tablet, take 2 tablets by mouth once daily, Disp: 60 tablet, Rfl: 10 .  Probiotic Product (PROBIOTIC DAILY PO), Take 1 tablet by mouth every morning. , Disp: , Rfl:  .  simvastatin (ZOCOR) 40 MG tablet, Take 40 mg by mouth every morning. , Disp: , Rfl:  .  VITAMIN K PO, Take 100 mcg by mouth every morning. OTC med, Disp: , Rfl:  No current facility-administered medications for this visit.  Facility-Administered Medications Ordered in Other Visits:  .  0.9 %  sodium chloride infusion, , Intravenous, Once, Fanny Skates, MD .  0.9 %  sodium chloride infusion, , Intravenous, Once, Fanny Skates, MD  Allergies: No Known Allergies  Past Medical History, Surgical history, Social history, and Family History were reviewed and updated.  Review of Systems: As above  Physical Exam:  vitals were not taken for this visit.  Head and neck exam shows no ocular or oral lesions. He has no palpable cervical or supraclavicular lymph nodes. Lungs are clear. Cardiac exam regular rate and rhythm with no murmurs, rubs or bruits. Abdomen is soft. He has laparoscopy scars from his splenectomy. There is no fluid wave. He has some keloid formation  at the scar sites. There is no palpable hepatomegaly. Back exam shows no tenderness over the spine, ribs or hips. Extremities shows no clubbing, cyanosis or edema. Skin exam no rashes, ecchymoses or petechia. Neurological exam is nonfocal.  Lab Results  Component Value Date   WBC 5.9 07/19/2015   HGB 12.7* 07/19/2015   HCT 37.2* 07/19/2015   MCV 89 07/19/2015   PLT <6* 07/19/2015     Chemistry      Component Value Date/Time   NA 140 05/03/2015 0827   NA 139 07/20/2014 0902   K 3.9 05/03/2015 0827   K 3.3 07/20/2014 0902   CL 105 05/03/2015 0827   CL 103 07/20/2014 0902   CO2 29 05/03/2015  0827   CO2 27 07/20/2014 0902   BUN 15 05/03/2015 0827   BUN 11 07/20/2014 0902   CREATININE 0.96 05/03/2015 0827   CREATININE 0.9 07/20/2014 0902      Component Value Date/Time   CALCIUM 9.2 05/03/2015 0827   CALCIUM 8.6 07/20/2014 0902   ALKPHOS 113 05/03/2015 0827   ALKPHOS 75 07/20/2014 0902   AST 32 05/03/2015 0827   AST 51* 07/20/2014 0902   ALT 33 05/03/2015 0827   ALT 36 07/20/2014 0902   BILITOT 0.5 05/03/2015 0827   BILITOT 0.80 07/20/2014 0902         Impression and Plan: Mr. [name] is 59 year old gentleman with refractory immune thrombocytopenia. His white count has dropped quite a bit. He is not responding as of yet. I will keep increasing the Nplate dose.  We will see what a liver spleen scan shows.  I may want to consider another round of Rituxan.  He has received IVIG in the past. I suppose is also might be a consideration.  We will keep his appointments that have already been scheduled.    [name], MD 8/24/20163:19 PM

## 2015-07-20 ENCOUNTER — Telehealth: Payer: Self-pay | Admitting: Hematology & Oncology

## 2015-07-20 NOTE — Telephone Encounter (Signed)
Lt mess regarding liver spleen scan on 9/6.

## 2015-07-24 ENCOUNTER — Telehealth: Payer: Self-pay | Admitting: Hematology & Oncology

## 2015-07-24 NOTE — Telephone Encounter (Signed)
°  [facility]  APPROVAL  Case Number: [id number] Status: Approved Dates: 07/20/2015 - 08/20/2015   AMICAR SOL .25 ML  P: [phone]    COPY SCANNED

## 2015-07-26 ENCOUNTER — Other Ambulatory Visit (HOSPITAL_BASED_OUTPATIENT_CLINIC_OR_DEPARTMENT_OTHER): Payer: BLUE CROSS/BLUE SHIELD

## 2015-07-26 ENCOUNTER — Ambulatory Visit (HOSPITAL_BASED_OUTPATIENT_CLINIC_OR_DEPARTMENT_OTHER): Payer: BLUE CROSS/BLUE SHIELD

## 2015-07-26 ENCOUNTER — Telehealth: Payer: Self-pay | Admitting: *Deleted

## 2015-07-26 VITALS — BP 127/73 | HR 80 | Temp 98.1°F | Resp 16

## 2015-07-26 DIAGNOSIS — Z9081 Acquired absence of spleen: Secondary | ICD-10-CM

## 2015-07-26 DIAGNOSIS — D693 Immune thrombocytopenic purpura: Secondary | ICD-10-CM | POA: Diagnosis not present

## 2015-07-26 LAB — CBC WITH DIFFERENTIAL (CANCER CENTER ONLY)
BASO#: 0.1 10*3/uL (ref 0.0–0.2)
BASO%: 0.8 % (ref 0.0–2.0)
EOS%: 2.1 % (ref 0.0–7.0)
Eosinophils Absolute: 0.1 10*3/uL (ref 0.0–0.5)
HCT: 36.3 % — ABNORMAL LOW (ref 38.7–49.9)
HGB: 12.2 g/dL — ABNORMAL LOW (ref 13.0–17.1)
LYMPH#: 1.8 10*3/uL (ref 0.9–3.3)
LYMPH%: 27.1 % (ref 14.0–48.0)
MCH: 30.3 pg (ref 28.0–33.4)
MCHC: 33.6 g/dL (ref 32.0–35.9)
MCV: 90 fL (ref 82–98)
MONO#: 1 10*3/uL — ABNORMAL HIGH (ref 0.1–0.9)
MONO%: 15 % — AB (ref 0.0–13.0)
NEUT%: 55 % (ref 40.0–80.0)
NEUTROS ABS: 3.6 10*3/uL (ref 1.5–6.5)
Platelets: <6 10*3/uL — CL (ref 145–400)
RBC: 4.02 10*6/uL — ABNORMAL LOW (ref 4.20–5.70)
RDW: 14.9 % (ref 11.1–15.7)
WBC: 6.5 10*3/uL (ref 4.0–10.0)

## 2015-07-26 MED ORDER — ROMIPLOSTIM INJECTION 500 MCG
750.0000 ug | Freq: Once | SUBCUTANEOUS | Status: AC
  Administered 2015-07-26: 750 ug via SUBCUTANEOUS
  Filled 2015-07-26: qty 1

## 2015-07-26 NOTE — Telephone Encounter (Signed)
Critical Value PLT <6 Dr [name] aware. Patient here for n-plate injection. No additional orders

## 2015-07-26 NOTE — Patient Instructions (Signed)
Romiplostim injection What is this medicine? ROMIPLOSTIM (roe mi PLOE stim) helps your body make more platelets. This medicine is used to treat low platelets caused by chronic idiopathic thrombocytopenic purpura (ITP). This medicine may be used for other purposes; ask your health care provider or pharmacist if you have questions. COMMON BRAND NAME(S): Nplate What should I tell my health care provider before I take this medicine? They need to know if you have any of these conditions: -cancer or myelodysplastic syndrome -low blood counts, like low white cell, platelet, or red cell counts -take medicines that treat or prevent blood clots -an unusual or allergic reaction to romiplostim, mannitol, other medicines, foods, dyes, or preservatives -pregnant or trying to get pregnant -breast-feeding How should I use this medicine? This medicine is for injection under the skin. It is given by a health care professional in a hospital or clinic setting. A special MedGuide will be given to you before your injection. Read this information carefully each time. Talk to your pediatrician regarding the use of this medicine in children. Special care may be needed. Overdosage: If you think you have taken too much of this medicine contact a poison control center or emergency room at once. NOTE: This medicine is only for you. Do not share this medicine with others. What if I miss a dose? It is important not to miss your dose. Call your doctor or health care professional if you are unable to keep an appointment. What may interact with this medicine? Interactions are not expected. This list may not describe all possible interactions. Give your health care provider a list of all the medicines, herbs, non-prescription drugs, or dietary supplements you use. Also tell them if you smoke, drink alcohol, or use illegal drugs. Some items may interact with your medicine. What should I watch for while using this  medicine? Your condition will be monitored carefully while you are receiving this medicine. Visit your prescriber or health care professional for regular checks on your progress and for the needed blood tests. It is important to keep all appointments. What side effects may I notice from receiving this medicine? Side effects that you should report to your doctor or health care professional as soon as possible: -allergic reactions like skin rash, itching or hives, swelling of the face, lips, or tongue -shortness of breath, chest pain, swelling in a leg -unusual bleeding or bruising Side effects that usually do not require medical attention (report to your doctor or health care professional if they continue or are bothersome): -dizziness -headache -muscle aches -pain in arms and legs -stomach pain -trouble sleeping This list may not describe all possible side effects. Call your doctor for medical advice about side effects. You may report side effects to FDA at 1-800-FDA-1088. Where should I keep my medicine? This drug is given in a hospital or clinic and will not be stored at home. NOTE: This sheet is a summary. It may not cover all possible information. If you have questions about this medicine, talk to your doctor, pharmacist, or health care provider.  2015, Elsevier/Gold Standard. (2008-07-11 15:13:04)  

## 2015-08-01 ENCOUNTER — Ambulatory Visit (HOSPITAL_COMMUNITY)
Admission: RE | Admit: 2015-08-01 | Discharge: 2015-08-01 | Disposition: A | Discharge status: Home / Self Care | Payer: BLUE CROSS/BLUE SHIELD | Source: Ambulatory Visit | Attending: Hematology & Oncology | Admitting: Hematology & Oncology

## 2015-08-01 DIAGNOSIS — D693 Immune thrombocytopenic purpura: Secondary | ICD-10-CM | POA: Diagnosis present

## 2015-08-01 MED ORDER — TECHNETIUM TC 99M SULFUR COLLOID
5.0300 | Freq: Once | INTRAVENOUS | Status: DC | PRN
  Administered 2015-08-01: 5.03 via INTRAVENOUS
  Filled 2015-08-01: qty 5.03

## 2015-08-02 ENCOUNTER — Telehealth: Payer: Self-pay | Admitting: *Deleted

## 2015-08-02 ENCOUNTER — Encounter: Payer: Self-pay | Admitting: Family

## 2015-08-02 ENCOUNTER — Other Ambulatory Visit (HOSPITAL_BASED_OUTPATIENT_CLINIC_OR_DEPARTMENT_OTHER): Payer: BLUE CROSS/BLUE SHIELD

## 2015-08-02 ENCOUNTER — Ambulatory Visit: Payer: BLUE CROSS/BLUE SHIELD

## 2015-08-02 ENCOUNTER — Ambulatory Visit (HOSPITAL_BASED_OUTPATIENT_CLINIC_OR_DEPARTMENT_OTHER): Payer: BLUE CROSS/BLUE SHIELD | Admitting: Family

## 2015-08-02 VITALS — BP 126/60 | HR 94 | Temp 97.8°F | Resp 16 | Ht 67.0 in | Wt 192.0 lb

## 2015-08-02 DIAGNOSIS — D693 Immune thrombocytopenic purpura: Secondary | ICD-10-CM

## 2015-08-02 DIAGNOSIS — Z9081 Acquired absence of spleen: Secondary | ICD-10-CM

## 2015-08-02 LAB — CBC WITH DIFFERENTIAL (CANCER CENTER ONLY)
BASO#: 0 10*3/uL (ref 0.0–0.2)
BASO%: 0.5 % (ref 0.0–2.0)
EOS%: 0.9 % (ref 0.0–7.0)
Eosinophils Absolute: 0.1 10*3/uL (ref 0.0–0.5)
HEMATOCRIT: 36.4 % — AB (ref 38.7–49.9)
HEMOGLOBIN: 12.1 g/dL — AB (ref 13.0–17.1)
LYMPH#: 1.6 10*3/uL (ref 0.9–3.3)
LYMPH%: 19.9 % (ref 14.0–48.0)
MCH: 29.9 pg (ref 28.0–33.4)
MCHC: 33.2 g/dL (ref 32.0–35.9)
MCV: 90 fL (ref 82–98)
MONO#: 0.9 10*3/uL (ref 0.1–0.9)
MONO%: 11.1 % (ref 0.0–13.0)
NEUT%: 67.6 % (ref 40.0–80.0)
NEUTROS ABS: 5.4 10*3/uL (ref 1.5–6.5)
Platelets: <6 10*3/uL — CL (ref 145–400)
RBC: 4.05 10*6/uL — AB (ref 4.20–5.70)
RDW: 14.8 % (ref 11.1–15.7)
WBC: 7.9 10*3/uL (ref 4.0–10.0)

## 2015-08-02 MED ORDER — ELTROMBOPAG OLAMINE 50 MG PO TABS: 50.0000 mg | ORAL_TABLET | Freq: Every day | ORAL | Status: DC

## 2015-08-02 NOTE — Telephone Encounter (Signed)
Critical Value Plt<6 Dr [name] aware. No orders at this time

## 2015-08-02 NOTE — Progress Notes (Signed)
No injection today per dr. ennever 

## 2015-08-02 NOTE — Progress Notes (Signed)
Hematology and Oncology Follow Up Visit  Scott Waters [id number] [birth year] 59 y.o. 08/02/2015   Principle Diagnosis:  Refractory immune thrombocytopenia - s/p splenectomy   Current Therapy:   Nplate as indicated    Interim History:  Scott Waters is here today for a follow-up. He just got off of work and is feeling fatigued. He states that the bleeding of the gums is better and that his bruises are improving. His platelet count is still < 6 at this time. He states that his stress test was negative. He has had no more chest pain. He has had no fever, chills, n/v, cough, rash, dizziness, SOB, palpitations, abdominal pain, changes in bowel or bladder habits. He has not noticed any blood in his urine or stool.  No lymphadenopathy found on exam.  No swelling or tenderness in his extremities. No c/o aches or pains at this time. He has some mild tingling in his feet. This has not effected his gait. He has had no falls.  He is eating well and staying hydrated. His weight is stable.  Medications:    Medication List       This list is accurate as of: 08/02/15  3:24 PM.  Always use your most recent med list.               aminocaproic acid 25 % solution  Commonly known as:  AMICAR  Rinse your mouth out with 1 teaspoon 4 times a day.     AZOR 10-40 MG per tablet  Generic drug:  amLODipine-olmesartan  Take 1 tablet by mouth every morning.     CINNAMON PO  Take 1,000 mg by mouth every morning.     eltrombopag 50 MG tablet  Commonly known as:  PROMACTA  Take 1 tablet (50 mg total) by mouth daily. Take on an empty stomach 1 hour before a meal or 2 hours after     fexofenadine 180 MG tablet  Commonly known as:  ALLEGRA  Take 180 mg by mouth every morning.     fluticasone 50 MCG/ACT nasal spray  Commonly known as:  FLONASE  instill 2 sprays into each nostril once daily     folic acid 1 MG tablet  Commonly known as:  FOLVITE  Take 1 tablet (1 mg total) by mouth daily.     glucosamine-chondroitin 500-400 MG tablet  Take 1 tablet by mouth every morning.     ibuprofen 200 MG tablet  Commonly known as:  ADVIL,MOTRIN  Take 400 mg by mouth 2 (two) times daily.     multivitamin with minerals Tabs tablet  Take 1 tablet by mouth every morning.     pantoprazole 40 MG tablet  Commonly known as:  PROTONIX  Take 1 tablet (40 mg total) by mouth daily.     potassium chloride 10 MEQ tablet  Commonly known as:  K-DUR,KLOR-CON  take 2 tablets by mouth once daily     PROBIOTIC DAILY PO  Take 1 tablet by mouth every morning.     simvastatin 40 MG tablet  Commonly known as:  ZOCOR  Take 40 mg by mouth every morning.     VITAMIN K PO  Take 100 mcg by mouth every morning. OTC med        Allergies: No Known Allergies  Past Medical History, Surgical history, Social history, and Family History were reviewed and updated.  Review of Systems: All other 10 point review of systems is negative.   Physical Exam:  height is  5\' 7"  (1.702 m) and weight is 192 lb (87.091 kg). His oral temperature is 97.8 F (36.6 C). His blood pressure is 126/60 and his pulse is 94. His respiration is 16.   Wt Readings from Last 3 Encounters:  08/02/15 192 lb (87.091 kg)  07/10/15 194 lb (87.998 kg)  07/05/15 194 lb (87.998 kg)    Ocular: Sclerae unicteric, pupils equal, round and reactive to light Ear-nose-throat: Oropharynx clear, dentition fair Lymphatic: No cervical or supraclavicular adenopathy Lungs no rales or rhonchi, good excursion bilaterally Heart regular rate and rhythm, no murmur appreciated Abd soft, nontender, positive bowel sounds MSK no focal spinal tenderness, no joint edema Neuro: non-focal, well-oriented, appropriate affect Breasts: Deferred  Lab Results  Component Value Date   WBC 7.9 08/02/2015   HGB 12.1* 08/02/2015   HCT 36.4* 08/02/2015   MCV 90 08/02/2015   PLT <6* 08/02/2015   No results found for: FERRITIN, IRON, TIBC, UIBC, IRONPCTSAT Lab  Results  Component Value Date   RETICCTPCT 2.6* 05/13/2014   RBC 4.05* 08/02/2015   RETICCTABS 91.0 05/13/2014   No results found for: KPAFRELGTCHN, LAMBDASER, KAPLAMBRATIO No results found for: IGGSERUM, IGA, IGMSERUM No results found for: Odetta Pink, SPEI   Chemistry      Component Value Date/Time   NA 140 05/03/2015 0827   NA 139 07/20/2014 0902   K 3.9 05/03/2015 0827   K 3.3 07/20/2014 0902   CL 105 05/03/2015 0827   CL 103 07/20/2014 0902   CO2 29 05/03/2015 0827   CO2 27 07/20/2014 0902   BUN 15 05/03/2015 0827   BUN 11 07/20/2014 0902   CREATININE 0.96 05/03/2015 0827   CREATININE 0.9 07/20/2014 0902      Component Value Date/Time   CALCIUM 9.2 05/03/2015 0827   CALCIUM 8.6 07/20/2014 0902   ALKPHOS 113 05/03/2015 0827   ALKPHOS 75 07/20/2014 0902   AST 32 05/03/2015 0827   AST 51* 07/20/2014 0902   ALT 33 05/03/2015 0827   ALT 36 07/20/2014 0902   BILITOT 0.5 05/03/2015 0827   BILITOT 0.80 07/20/2014 0902     Impression and Plan: Scott Waters is a pleasant 59 yo gentleman with refractory immune thrombocytopenia. His platelet count is still less that 6 despite receiving several doses of Nplate. His bruising has improved and he has not had much bleeding around his gums. His Hgb is stable at 12.1 with an MCV of 90.  We will try him on Promacta and hopefully this will boost his platelet count.  We will continue to check his labs work weekly.  We will plan to see him back in 1 month for follow-up.  He knows to contact us with any questions or concerns. We can certainly see him sooner if need be.   Eliezer Bottom, NP 9/7/20163:24 PM

## 2015-08-03 ENCOUNTER — Other Ambulatory Visit: Payer: Self-pay | Admitting: *Deleted

## 2015-08-03 DIAGNOSIS — D693 Immune thrombocytopenic purpura: Secondary | ICD-10-CM

## 2015-08-03 MED ORDER — ELTROMBOPAG OLAMINE 50 MG PO TABS: 50.0000 mg | ORAL_TABLET | Freq: Every day | ORAL | Status: DC

## 2015-08-07 ENCOUNTER — Telehealth: Payer: Self-pay | Admitting: Hematology & Oncology

## 2015-08-07 NOTE — Telephone Encounter (Signed)
[facility] APPROVAL  Case Number: [id number] Status: Approved Dates: 08/03/2015 - 08/02/2016   PROMACTA TAB 50 MG   P: [phone]    COPY SCANNED

## 2015-08-09 ENCOUNTER — Other Ambulatory Visit: Payer: Self-pay | Admitting: *Deleted

## 2015-08-09 ENCOUNTER — Other Ambulatory Visit (HOSPITAL_BASED_OUTPATIENT_CLINIC_OR_DEPARTMENT_OTHER): Payer: BLUE CROSS/BLUE SHIELD

## 2015-08-09 DIAGNOSIS — D693 Immune thrombocytopenic purpura: Secondary | ICD-10-CM

## 2015-08-09 DIAGNOSIS — Z9081 Acquired absence of spleen: Secondary | ICD-10-CM

## 2015-08-09 LAB — TECHNOLOGIST REVIEW CHCC SATELLITE

## 2015-08-09 LAB — CBC WITH DIFFERENTIAL (CANCER CENTER ONLY)
BASO#: 0.1 10*3/uL (ref 0.0–0.2)
BASO%: 0.6 % (ref 0.0–2.0)
EOS ABS: 0.1 10*3/uL (ref 0.0–0.5)
EOS%: 1.3 % (ref 0.0–7.0)
HCT: 35.1 % — ABNORMAL LOW (ref 38.7–49.9)
HGB: 11.7 g/dL — ABNORMAL LOW (ref 13.0–17.1)
LYMPH#: 2.1 10*3/uL (ref 0.9–3.3)
LYMPH%: 21.8 % (ref 14.0–48.0)
MCH: 30.2 pg (ref 28.0–33.4)
MCHC: 33.3 g/dL (ref 32.0–35.9)
MCV: 91 fL (ref 82–98)
MONO#: 0.9 10*3/uL (ref 0.1–0.9)
MONO%: 9.8 % (ref 0.0–13.0)
NEUT%: 66.5 % (ref 40.0–80.0)
NEUTROS ABS: 6.4 10*3/uL (ref 1.5–6.5)
RBC: 3.88 10*6/uL — ABNORMAL LOW (ref 4.20–5.70)
RDW: 14.3 % (ref 11.1–15.7)
WBC: 9.6 10*3/uL (ref 4.0–10.0)

## 2015-08-09 MED ORDER — AMINOCAPROIC ACID 25 % PO SYRP: ORAL_SOLUTION | ORAL | Status: DC

## 2015-08-10 ENCOUNTER — Ambulatory Visit: Payer: BLUE CROSS/BLUE SHIELD | Admitting: Hematology & Oncology

## 2015-08-10 ENCOUNTER — Other Ambulatory Visit: Payer: BLUE CROSS/BLUE SHIELD

## 2015-08-10 ENCOUNTER — Telehealth: Payer: Self-pay | Admitting: *Deleted

## 2015-08-10 ENCOUNTER — Ambulatory Visit: Payer: BLUE CROSS/BLUE SHIELD

## 2015-08-10 ENCOUNTER — Telehealth: Payer: Self-pay | Admitting: Hematology & Oncology

## 2015-08-10 NOTE — Telephone Encounter (Signed)
Critical Value platelets <6 Dr [name] notified and orders received.   Late entry for 08/09/2015  At 1400

## 2015-08-10 NOTE — Telephone Encounter (Signed)
Pt advise he cannot afford his co-pay of $220 for PROMACTA.  I gave him information to apply for assistance w programs below:  Promacta 50mg   [employer] [facility] Patient Assistance Program TEL: [employer] TEL: [phone] Patient [facility] [place]) TEL: [place] Program TEL: [phone] ext. 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

## 2015-08-12 ENCOUNTER — Encounter (HOSPITAL_COMMUNITY): Payer: Self-pay | Admitting: Emergency Medicine

## 2015-08-12 ENCOUNTER — Inpatient Hospital Stay (HOSPITAL_COMMUNITY)
Admission: EM | Admit: 2015-08-12 | Discharge: 2015-08-18 | DRG: 813 | Disposition: A | Discharge status: Home / Self Care | Payer: BLUE CROSS/BLUE SHIELD | Attending: Internal Medicine | Admitting: Internal Medicine

## 2015-08-12 DIAGNOSIS — D693 Immune thrombocytopenic purpura: Secondary | ICD-10-CM | POA: Diagnosis not present

## 2015-08-12 DIAGNOSIS — D696 Thrombocytopenia, unspecified: Secondary | ICD-10-CM

## 2015-08-12 DIAGNOSIS — D72829 Elevated white blood cell count, unspecified: Secondary | ICD-10-CM | POA: Diagnosis present

## 2015-08-12 DIAGNOSIS — D649 Anemia, unspecified: Secondary | ICD-10-CM | POA: Diagnosis present

## 2015-08-12 DIAGNOSIS — I959 Hypotension, unspecified: Secondary | ICD-10-CM | POA: Diagnosis present

## 2015-08-12 DIAGNOSIS — T380X5A Adverse effect of glucocorticoids and synthetic analogues, initial encounter: Secondary | ICD-10-CM | POA: Diagnosis present

## 2015-08-12 DIAGNOSIS — Z9081 Acquired absence of spleen: Secondary | ICD-10-CM | POA: Diagnosis present

## 2015-08-12 DIAGNOSIS — K921 Melena: Secondary | ICD-10-CM | POA: Diagnosis present

## 2015-08-12 DIAGNOSIS — K068 Other specified disorders of gingiva and edentulous alveolar ridge: Secondary | ICD-10-CM | POA: Insufficient documentation

## 2015-08-12 DIAGNOSIS — I10 Essential (primary) hypertension: Secondary | ICD-10-CM | POA: Diagnosis present

## 2015-08-12 DIAGNOSIS — D589 Hereditary hemolytic anemia, unspecified: Secondary | ICD-10-CM | POA: Diagnosis present

## 2015-08-12 DIAGNOSIS — E785 Hyperlipidemia, unspecified: Secondary | ICD-10-CM | POA: Diagnosis present

## 2015-08-12 DIAGNOSIS — R42 Dizziness and giddiness: Secondary | ICD-10-CM | POA: Diagnosis present

## 2015-08-12 DIAGNOSIS — M1990 Unspecified osteoarthritis, unspecified site: Secondary | ICD-10-CM | POA: Diagnosis present

## 2015-08-12 DIAGNOSIS — Z87891 Personal history of nicotine dependence: Secondary | ICD-10-CM

## 2015-08-12 DIAGNOSIS — K055 Other periodontal diseases: Secondary | ICD-10-CM | POA: Diagnosis present

## 2015-08-12 DIAGNOSIS — E876 Hypokalemia: Secondary | ICD-10-CM | POA: Diagnosis present

## 2015-08-12 LAB — BASIC METABOLIC PANEL
ANION GAP: 7 (ref 5–15)
BUN: 21 mg/dL — ABNORMAL HIGH (ref 6–20)
CALCIUM: 8.6 mg/dL — AB (ref 8.9–10.3)
CHLORIDE: 104 mmol/L (ref 101–111)
CO2: 24 mmol/L (ref 22–32)
CREATININE: 0.83 mg/dL (ref 0.61–1.24)
GFR calc Af Amer: >60 mL/min (ref >60)
GFR calc non Af Amer: >60 mL/min (ref >60)
GLUCOSE: 109 mg/dL — AB (ref 65–99)
Potassium: 3.6 mmol/L (ref 3.5–5.1)
Sodium: 135 mmol/L (ref 135–145)

## 2015-08-12 MED ORDER — AMINOCAPROIC ACID SOLUTION 5% (50 MG/ML)
10.0000 mL | ORAL | Status: DC
  Administered 2015-08-13: 10 mL via ORAL
  Filled 2015-08-12 (×2): qty 100

## 2015-08-12 MED ORDER — SODIUM CHLORIDE 0.9 % IV BOLUS (SEPSIS)
1000.0000 mL | Freq: Once | INTRAVENOUS | Status: AC
  Administered 2015-08-13: 1000 mL via INTRAVENOUS

## 2015-08-12 NOTE — ED Provider Notes (Signed)
CSN: [id number]     Arrival date & time 08/12/15  2238 History   This chart was scribed for Scott Palumbo, MD by Forrestine Him, ED Scribe. This patient was seen in room A13C/A13C and the patient's care was started 11:47 PM.   Chief Complaint  Patient presents with  . Coagulation Disorder   The history is provided by the patient. No language interpreter was used.    HPI Comments: Scott Waters is a 59 y.o. male with a PMHx of thrombocytopenia, HTN, and hyperlipidemia who presents to the Emergency Department here for a coagulation disorder this evening. Pt states his gums have been bleeding persistently for the last few days. However, this is not new as pt has been started on medication in the last month to help slow bleeding in his gums. [name] admits he has not started on the medication as he has not been able to pick it up from the pharmacy. OTC mouthwash attempted at home to help with bleeding without any improvement. No recent fever, chills, nausea, vomiting, chest pain, or shortness of breath. Per chart review, platelets have been less than 6000 since August 10 of this year. Platelets last known normal in June of this year. He is due to start a new drug for ITP on Monday 9/19. No known allergies to medications.  Past Medical History  Diagnosis Date  . Seasonal allergies   . Hypertension   . Hyperlipidemia   . Hx of colonic polyps 2007  . Gout 04/27/2011    One episode of joint pain at [facility]   Presumed    . Abnormal LFTs 04/27/2011  . Thrombocytopenia 04/15/2014    admitted for IVIG  . GERD (gastroesophageal reflux disease)     "mild"  . Osteoarthritis   . Arthritis     "knees" (04/16/2014)  . 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 Lower leg edema   . Anxiety   . Depression   . Urinary frequency   . H/O splenectomy    Past Surgical History  Procedure Laterality Date  . Polypectomy    . Multiple tooth extractions  2014    "took out all the top teeth"  .  Tonsillectomy and adenoidectomy  1973  . Laceration repair Right ~ 1983    "inner knee; steel tubing fell on me"  . Shoulder arthroscopy Left 08/2001    "removed bone spur"  . Laparoscopic splenectomy N/A 11/08/2014    Procedure: LAPAROSCOPIC SPLENECTOMY;  Surgeon: Fanny Skates, MD;  Location: [facility];  Service: General;  Laterality: N/A;  . Laparotomy N/A 11/14/2014    Procedure: EXPLORATORY LAPAROTOMY, RELEASE SMALL BOWEL OBSTRUCTION, INCISIONAL HERNIA REPAIR;  Surgeon: Fanny Skates, MD;  Location: [facility];  Service: General;  Laterality: N/A;   Family History  Problem Relation Age of Onset  . Colon cancer Father 52    Died at 56  . Hypertension Mother   . Diabetes Mother   . Kidney disease Mother     diailysis from dm   . Stroke     Social History  Substance Use Topics  . Smoking status: Former Smoker -- 1.00 packs/day for 33 years    Types: Cigarettes    Start date: 10/02/1970    Quit date: 01/02/2003  . Smokeless tobacco: Former Systems developer    Types: Snuff, Sarina Ser    Quit date: 10/03/2003     Comment: quit 11 years ago  . Alcohol Use: 16.8 oz/week    14 Cans of beer, 14 Shots  of liquor per week     Comment: 2 beers daily, 2 shots liquor daily     Review of Systems  Constitutional: Negative for fever and chills.  Respiratory: Negative for cough and shortness of breath.   Cardiovascular: Negative for chest pain.  Gastrointestinal: Negative for nausea, vomiting and abdominal pain.  Genitourinary: Negative for dysuria.  Skin: Positive for wound. Negative for rash.  Neurological: Negative for headaches.  Psychiatric/Behavioral: Negative for confusion.  All other systems reviewed and are negative.     Allergies  Review of patient's allergies indicates no known allergies.  Home Medications   Prior to Admission medications   Medication Sig Start Date End Date Taking? Authorizing Rexann Lueras  aminocaproic acid (AMICAR) 25 % solution Rinse your mouth out with 1 teaspoon 4  times a day. 08/09/15   [name], MD  amLODipine-olmesartan (AZOR) 10-40 MG per tablet Take 1 tablet by mouth every morning.     Historical [name], MD  CINNAMON PO Take 1,000 mg by mouth every morning.    Historical [name], MD  eltrombopag (PROMACTA) 50 MG tablet Take 1 tablet (50 mg total) by mouth daily. Take on an empty stomach 1 hour before a meal or 2 hours after 08/03/15   [name], MD  fexofenadine (ALLEGRA) 180 MG tablet Take 180 mg by mouth every morning.    Historical Hollister Wessler, MD  fluticasone Asencion Islam) 50 MCG/ACT nasal spray instill 2 sprays into each nostril once daily 11/24/14   Burnis Medin, MD  folic acid (FOLVITE) 1 MG tablet Take 1 tablet (1 mg total) by mouth daily. 07/12/15   [name], MD  glucosamine-chondroitin 500-400 MG tablet Take 1 tablet by mouth every morning. 01/04/15   [name], MD  ibuprofen (ADVIL,MOTRIN) 200 MG tablet Take 400 mg by mouth 2 (two) times daily.    Historical Zylah Elsbernd, MD  Multiple Vitamin (MULTIVITAMIN WITH MINERALS) TABS tablet Take 1 tablet by mouth every morning.     Historical Kingson Lohmeyer, MD  pantoprazole (PROTONIX) 40 MG tablet Take 1 tablet (40 mg total) by mouth daily. 05/23/14   [name], MD  potassium chloride (K-DUR,KLOR-CON) 10 MEQ tablet take 2 tablets by mouth once daily 05/31/15   Burnis Medin, MD  Probiotic Product (PROBIOTIC DAILY PO) Take 1 tablet by mouth every morning.     Historical Hena Ewalt, MD  simvastatin (ZOCOR) 40 MG tablet Take 40 mg by mouth every morning.     Historical Jaquese Irving, MD  VITAMIN K PO Take 100 mcg by mouth every morning. OTC med 07/04/14   Historical Dondra Rhett, MD   Triage Vitals: BP 93/40 mmHg  Pulse 106  Temp(Src) 98.7 F (37.1 C) (Oral)  Resp 20  SpO2 99%   Physical Exam  Constitutional: He is oriented to person, place, and time. He appears well-developed and well-nourished.  HENT:  Head: Normocephalic and atraumatic.  Bleeding noted to posterior lower anterior to the  6th tooth and some around the second R lower molar  Eyes: Conjunctivae and EOM are normal. Pupils are equal, round, and reactive to light.  Neck: Normal range of motion.  Cardiovascular: Normal rate, regular rhythm, normal heart sounds and intact distal pulses.   No murmur heard. Pulmonary/Chest: Effort normal and breath sounds normal. No respiratory distress.  Abdominal: Soft. Bowel sounds are normal. He exhibits no distension. There is no tenderness. There is no rebound.  Musculoskeletal: Normal range of motion. He exhibits no edema.  All compartments soft Intact dorsalis  pedis bilaterally   Neurological: He is alert and oriented to person, place, and time. He has normal reflexes.  Skin: Skin is warm and dry. Petechiae noted. No ecchymosis noted.     Psychiatric: He has a normal mood and affect. Judgment normal.  Nursing note and vitals reviewed.   ED Course  Procedures (including critical care time)  DIAGNOSTIC STUDIES: Oxygen Saturation is 99% on RA, Normal by my interpretation.    COORDINATION OF CARE: 11:57 PM- Will give fluids and Amicar. Will order PT-INR, CBC, and BMP. Discussed treatment plan with pt at bedside and pt agreed to plan.     Labs Review Labs Reviewed  BASIC METABOLIC PANEL - Abnormal; Notable for the following:    Glucose, Bld 109 (*)    BUN 21 (*)    Calcium 8.6 (*)    All other components within normal limits  CBC    Imaging Review No results found. I have personally reviewed and evaluated these images and lab results as part of my medical decision-making.   EKG Interpretation None      MDM   Final diagnoses:  None   Results for orders placed or performed during the hospital encounter of 08/12/15  CBC  Result Value Ref Range   WBC 14.4 (H) 4.0 - 10.5 K/uL   RBC 2.99 (L) 4.22 - 5.81 MIL/uL   Hemoglobin 9.0 (L) 13.0 - 17.0 g/dL   HCT 27.1 (L) 39.0 - 52.0 %   MCV 90.6 78.0 - 100.0 fL   MCH 30.1 26.0 - 34.0 pg   MCHC 33.2 30.0 - 36.0  g/dL   RDW 15.0 11.5 - 15.5 %   Platelets 5 (LL) 150 - 400 K/uL  Basic metabolic panel  Result Value Ref Range   Sodium 135 135 - 145 mmol/L   Potassium 3.6 3.5 - 5.1 mmol/L   Chloride 104 101 - 111 mmol/L   CO2 24 22 - 32 mmol/L   Glucose, Bld 109 (H) 65 - 99 mg/dL   BUN 21 (H) 6 - 20 mg/dL   Creatinine, Ser 0.83 0.61 - 1.24 mg/dL   Calcium 8.6 (L) 8.9 - 10.3 mg/dL   GFR calc non Af Amer >60 >60 mL/min   GFR calc Af Amer >60 >60 mL/min   Anion gap 7 5 - 15  Type and screen for Red Blood Exchange  Result Value Ref Range   ABO/RH(D) A POS    Antibody Screen NEG    Sample Expiration 08/16/2015    Nm Liver Spleen Scan  08/01/2015   CLINICAL DATA:  Recurrent thrombocytopenia.  EXAM: NUCLEAR MEDICINE LIVER - SPLEEN SCAN  TECHNIQUE: Abdominal images were obtained in multiple projections after intravenous injection of radiopharmaceutical.  RADIOPHARMACEUTICALS:  5.03 mCi Tc-71m sulfur colloid IV  COMPARISON:  11/14/2014  FINDINGS: Normal physiologic activity is identified within the liver. no additional foci of increased uptake identified to suggest residual splenic tissue.  IMPRESSION: 1. No evidence for residual or recurrent splenic tissue.   Electronically Signed   By: Kerby Moors M.D.   On: 08/01/2015 15:21    Medications  sodium chloride 0.9 % bolus 1,000 mL (not administered)  sodium chloride 0.9 % bolus 1,000 mL (1,000 mLs Intravenous New Bag/Given 08/13/15 0017)    I personally performed the services described in this documentation, which was scribed in my presence. The recorded information has been reviewed and is accurate.    Veatrice Kells, MD 08/13/15 [phone]

## 2015-08-12 NOTE — ED Notes (Signed)
Pt has thrombocytopenia and has been having bleeding to his gums. Pt also has been having dark stools. Pt is supposed to start taking promacta tomorrow.

## 2015-08-13 DIAGNOSIS — D589 Hereditary hemolytic anemia, unspecified: Secondary | ICD-10-CM | POA: Diagnosis present

## 2015-08-13 DIAGNOSIS — K055 Other periodontal diseases: Secondary | ICD-10-CM

## 2015-08-13 DIAGNOSIS — I959 Hypotension, unspecified: Secondary | ICD-10-CM | POA: Diagnosis present

## 2015-08-13 DIAGNOSIS — K068 Other specified disorders of gingiva and edentulous alveolar ridge: Secondary | ICD-10-CM | POA: Insufficient documentation

## 2015-08-13 DIAGNOSIS — R42 Dizziness and giddiness: Secondary | ICD-10-CM | POA: Diagnosis present

## 2015-08-13 DIAGNOSIS — D696 Thrombocytopenia, unspecified: Secondary | ICD-10-CM | POA: Diagnosis not present

## 2015-08-13 DIAGNOSIS — K921 Melena: Secondary | ICD-10-CM | POA: Diagnosis present

## 2015-08-13 DIAGNOSIS — I10 Essential (primary) hypertension: Secondary | ICD-10-CM | POA: Diagnosis present

## 2015-08-13 DIAGNOSIS — Z87891 Personal history of nicotine dependence: Secondary | ICD-10-CM | POA: Diagnosis not present

## 2015-08-13 DIAGNOSIS — I9589 Other hypotension: Secondary | ICD-10-CM | POA: Diagnosis not present

## 2015-08-13 DIAGNOSIS — E785 Hyperlipidemia, unspecified: Secondary | ICD-10-CM | POA: Diagnosis present

## 2015-08-13 DIAGNOSIS — T380X5A Adverse effect of glucocorticoids and synthetic analogues, initial encounter: Secondary | ICD-10-CM | POA: Diagnosis present

## 2015-08-13 DIAGNOSIS — M1990 Unspecified osteoarthritis, unspecified site: Secondary | ICD-10-CM | POA: Diagnosis present

## 2015-08-13 DIAGNOSIS — E876 Hypokalemia: Secondary | ICD-10-CM | POA: Diagnosis present

## 2015-08-13 DIAGNOSIS — D649 Anemia, unspecified: Secondary | ICD-10-CM | POA: Diagnosis present

## 2015-08-13 DIAGNOSIS — D693 Immune thrombocytopenic purpura: Secondary | ICD-10-CM | POA: Diagnosis present

## 2015-08-13 DIAGNOSIS — D72829 Elevated white blood cell count, unspecified: Secondary | ICD-10-CM | POA: Diagnosis present

## 2015-08-13 DIAGNOSIS — Z9081 Acquired absence of spleen: Secondary | ICD-10-CM | POA: Diagnosis present

## 2015-08-13 DIAGNOSIS — D599 Acquired hemolytic anemia, unspecified: Secondary | ICD-10-CM | POA: Diagnosis not present

## 2015-08-13 LAB — CBC
HEMATOCRIT: 27.1 % — AB (ref 39.0–52.0)
Hemoglobin: 9 g/dL — ABNORMAL LOW (ref 13.0–17.0)
MCH: 30.1 pg (ref 26.0–34.0)
MCHC: 33.2 g/dL (ref 30.0–36.0)
MCV: 90.6 fL (ref 78.0–100.0)
Platelets: 5 10*3/uL — CL (ref 150–400)
RBC: 2.99 MIL/uL — AB (ref 4.22–5.81)
RDW: 15 % (ref 11.5–15.5)
WBC: 14.4 10*3/uL — AB (ref 4.0–10.5)

## 2015-08-13 LAB — BASIC METABOLIC PANEL
ANION GAP: 7 (ref 5–15)
BUN: 15 mg/dL (ref 6–20)
CALCIUM: 8.6 mg/dL — AB (ref 8.9–10.3)
CO2: 24 mmol/L (ref 22–32)
CREATININE: 0.76 mg/dL (ref 0.61–1.24)
Chloride: 108 mmol/L (ref 101–111)
GFR calc Af Amer: >60 mL/min (ref >60)
GLUCOSE: 106 mg/dL — AB (ref 65–99)
Potassium: 3.7 mmol/L (ref 3.5–5.1)
Sodium: 139 mmol/L (ref 135–145)

## 2015-08-13 LAB — PREPARE RBC (CROSSMATCH)

## 2015-08-13 LAB — PROTIME-INR
INR: 1.07 (ref 0.00–1.49)
Prothrombin Time: 14.1 seconds (ref 11.6–15.2)

## 2015-08-13 MED ORDER — IMMUNE GLOBULIN (HUMAN) 10 GM/100ML IV SOLN
1.0000 g/kg | INTRAVENOUS | Status: AC
  Administered 2015-08-13: 13:00:00 via INTRAVENOUS
  Administered 2015-08-14: 90 g via INTRAVENOUS
  Filled 2015-08-13 (×2): qty 900

## 2015-08-13 MED ORDER — ACETAMINOPHEN 650 MG RE SUPP: 650.0000 mg | Freq: Four times a day (QID) | RECTAL | Status: DC | PRN

## 2015-08-13 MED ORDER — METHYLPREDNISOLONE SODIUM SUCC 125 MG IJ SOLR
60.0000 mg | INTRAMUSCULAR | Status: DC
  Administered 2015-08-13: 60 mg via INTRAVENOUS
  Filled 2015-08-13: qty 2

## 2015-08-13 MED ORDER — AMINOCAPROIC ACID 25 % PO SYRP
4.0000 g | ORAL_SOLUTION | Freq: Four times a day (QID) | ORAL | Status: DC
  Administered 2015-08-13 – 2015-08-18 (×21): 4 g via ORAL
  Filled 2015-08-13 (×25): qty 16

## 2015-08-13 MED ORDER — ACETAMINOPHEN 325 MG PO TABS
650.0000 mg | ORAL_TABLET | ORAL | Status: AC
  Administered 2015-08-13 – 2015-08-14 (×2): 650 mg via ORAL
  Filled 2015-08-13 (×2): qty 2

## 2015-08-13 MED ORDER — METHYLPREDNISOLONE SODIUM SUCC 125 MG IJ SOLR: 60.0000 mg | Freq: Once | INTRAMUSCULAR | Status: DC

## 2015-08-13 MED ORDER — FLUTICASONE PROPIONATE 50 MCG/ACT NA SUSP
2.0000 | Freq: Every day | NASAL | Status: DC
  Administered 2015-08-13 – 2015-08-18 (×6): 2 via NASAL
  Filled 2015-08-13 (×2): qty 16

## 2015-08-13 MED ORDER — CINNAMON 500 MG PO CAPS: 1000.0000 mg | ORAL_CAPSULE | Freq: Every morning | ORAL | Status: DC

## 2015-08-13 MED ORDER — ELTROMBOPAG OLAMINE 50 MG PO TABS: 50.0000 mg | ORAL_TABLET | Freq: Every day | ORAL | Status: DC

## 2015-08-13 MED ORDER — DIPHENHYDRAMINE HCL 25 MG PO CAPS: 25.0000 mg | ORAL_CAPSULE | Freq: Once | ORAL | Status: DC

## 2015-08-13 MED ORDER — ADULT MULTIVITAMIN W/MINERALS CH
1.0000 | ORAL_TABLET | Freq: Every morning | ORAL | Status: DC
  Administered 2015-08-13 – 2015-08-18 (×6): 1 via ORAL
  Filled 2015-08-13 (×6): qty 1

## 2015-08-13 MED ORDER — DIPHENHYDRAMINE HCL 25 MG PO CAPS
25.0000 mg | ORAL_CAPSULE | ORAL | Status: AC
  Administered 2015-08-13 – 2015-08-14 (×2): 25 mg via ORAL
  Filled 2015-08-13 (×2): qty 1

## 2015-08-13 MED ORDER — FOLIC ACID 1 MG PO TABS
1.0000 mg | ORAL_TABLET | Freq: Every day | ORAL | Status: DC
  Administered 2015-08-13 – 2015-08-18 (×6): 1 mg via ORAL
  Filled 2015-08-13 (×6): qty 1

## 2015-08-13 MED ORDER — SIMVASTATIN 40 MG PO TABS
40.0000 mg | ORAL_TABLET | Freq: Every morning | ORAL | Status: DC
  Administered 2015-08-13: 40 mg via ORAL
  Filled 2015-08-13: qty 1

## 2015-08-13 MED ORDER — ACETAMINOPHEN 325 MG PO TABS
650.0000 mg | ORAL_TABLET | Freq: Four times a day (QID) | ORAL | Status: DC | PRN
  Administered 2015-08-17: 650 mg via ORAL
  Filled 2015-08-13 (×2): qty 2

## 2015-08-13 MED ORDER — SODIUM CHLORIDE 0.9 % IV BOLUS (SEPSIS)
1000.0000 mL | Freq: Once | INTRAVENOUS | Status: AC
  Administered 2015-08-13: 1000 mL via INTRAVENOUS

## 2015-08-13 MED ORDER — SODIUM CHLORIDE 0.9 % IV SOLN: Freq: Once | INTRAVENOUS | Status: DC

## 2015-08-13 MED ORDER — LORATADINE 10 MG PO TABS
10.0000 mg | ORAL_TABLET | Freq: Every day | ORAL | Status: DC
  Administered 2015-08-13 – 2015-08-18 (×6): 10 mg via ORAL
  Filled 2015-08-13 (×6): qty 1

## 2015-08-13 MED ORDER — PANTOPRAZOLE SODIUM 40 MG PO TBEC
40.0000 mg | DELAYED_RELEASE_TABLET | Freq: Two times a day (BID) | ORAL | Status: DC
  Administered 2015-08-13 – 2015-08-18 (×11): 40 mg via ORAL
  Filled 2015-08-13 (×11): qty 1

## 2015-08-13 MED ORDER — ACETAMINOPHEN 325 MG PO TABS: 650.0000 mg | ORAL_TABLET | Freq: Once | ORAL | Status: DC

## 2015-08-13 NOTE — Progress Notes (Signed)
NURSING PROGRESS NOTE  Scott Waters [id number] Admission Data: 08/13/2015 5:10 AM Attending Provider: Mendel Corning, MD DVV:OHYWVP,XTGGY KOTVAN, MD Code Status: Full  Scott Waters is a 59 y.o. male patient admitted from ED:  -No acute distress noted.  -No complaints of shortness of breath.  -No complaints of chest pain.   Cardiac Monitoring: Box # 1 in place. Cardiac monitor yields:normal sinus rhythm.  Blood pressure 113/68, pulse 97, temperature 98.1 F (36.7 C), temperature source Oral, resp. rate 18, height 5' 9.6" (1.768 m), weight 87.5 kg (192 lb 14.4 oz), SpO2 100 %.   IV Fluids:  IV in place, occlusive dsg intact without redness, IV cath antecubital right, condition patent and no redness none.   Allergies:  Review of patient's allergies indicates no known allergies.  Past Medical History:   has a past medical history of Seasonal allergies; Hypertension; Hyperlipidemia; colonic polyps (2007); Gout (04/27/2011); Abnormal LFTs (04/27/2011); Thrombocytopenia (04/15/2014); GERD (gastroesophageal reflux disease); Osteoarthritis; Arthritis; ITP (idiopathic thrombocytopenic purpura) (04/30/2014); Lower leg edema; Anxiety; Depression; Urinary frequency; and H/O splenectomy.  Past Surgical History:   has past surgical history that includes Polypectomy; Multiple tooth extractions (2014); Tonsillectomy and adenoidectomy (1973); Laceration repair (Right, ~ 1983); Shoulder arthroscopy (Left, 08/2001); laparoscopic splenectomy (N/A, 11/08/2014); and laparotomy (N/A, 11/14/2014).  Social History:   reports that he quit smoking about 12 years ago. His smoking use included Cigarettes. He started smoking about 44 years ago. He has a 33 pack-year smoking history. He quit smokeless tobacco use about 11 years ago. His smokeless tobacco use included Snuff and Chew. He reports that he drinks about 16.8 oz of alcohol per week. He reports that he uses illicit drugs.  Skin: intact  Patient/Family  orientated to room. Information packet given to patient/family. Admission inpatient armband information verified with patient/family to include name and date of birth and placed on patient arm. Side rails up x 2, fall assessment and education completed with patient/family. Patient/family able to verbalize understanding of risk associated with falls and verbalized understanding to call for assistance before getting out of bed. Call light within reach. Patient/family able to voice and demonstrate understanding of unit orientation instructions.

## 2015-08-13 NOTE — Progress Notes (Signed)
PHARMACIST - PHYSICIAN ORDER COMMUNICATION  CONCERNING: P&T Medication Policy on Herbal Medications  DESCRIPTION:  This patient's order for:  Cinnamon  has been noted.  This product(s) is classified as an "herbal" or natural product. Due to a lack of definitive safety studies or FDA approval, nonstandard manufacturing practices, plus the potential risk of unknown drug-drug interactions while on inpatient medications, the Pharmacy and Therapeutics Committee does not permit the use of "herbal" or natural products of this type within [place].   ACTION TAKEN: The pharmacy department is unable to verify this order at this time and your patient has been informed of this safety policy. Please reevaluate patient's clinical condition at discharge and address if the herbal or natural product(s) should be resumed at that time.  

## 2015-08-13 NOTE — Consult Note (Signed)
[place]  Telephone:([phone]) [phone] Fax:(336) [phone]     ID: Scott Waters DOB: [birth year]  Scott#: [id number]  EKC#:[id number]  Patient Care Team: Burnis Medin, MD as PCP - General Irene Shipper, MD as Attending Physician (Gastroenterology) [name], MD as Consulting Physician (Oncology) PCP: [name], MD GYN: SU:  OTHER MD:  CHIEF COMPLAINT: refractory ITP  CURRENT TREATMENT: starting IVIG 08/13/2015, 2 doses planned   HISTORY OF PRESENT ILLNESS: .From Dr Freddie Apley 11/09/2014 summary:  "Scott Waters is a very nice 59 year old, African American gentleman. He is well known to me. He presented around May 2014. He had profound thrombocytopenia. At first, we thought that he may have had TTP, but studies were all normal for that.  We treated him with IVIG and steroids. These were very transiently effective. He then got Rituxan. This was also transiently effective.  He was given Nplate. The Nplate was working. I thought that we might be able to get him to splenectomy and this would help get his platelet count up high enough so that we could hold on the splenectomy.  He has had a couple of bone marrow test done. Last bone marrow test was done back in October. Again, there was no bone marrow issue other than the immune-based thrombocytopenia.  We decided to try a splenectomy on him. He got his vaccines.  He got his last dose of Nplate on the 50VW. His platelet count was 160,000.  On the day of surgery, his platelet count was 15,000"  -------  The patient underwent splenectomy 11/09/2015. The pathology report [id number] showed no evidence of lymphoma. An accessory spleen was removed at the same time. However, despite splenectomy the platelet count dropped and in early August 2016 the patient was started on Nplate. He received this weekly x 4, last dose 07/19/2015. There was no response:  Results for Scott, Waters  (MRN [id number]) as of 08/13/2015 13:05  Ref. Range 01/04/2015 14:06 02/01/2015 13:34 04/05/2015 13:28 05/03/2015 08:27 07/05/2015 13:26 07/12/2015 11:23 07/19/2015 14:02 07/26/2015 13:52 08/02/2015 13:25 08/09/2015 13:55 08/12/2015 23:06  Platelets Latest Ref Range: 150-400 K/uL 32 (L) 74 Platelet count... (L) 101 (L) 101.0 (L) <6 (LL) <6 (LL) <6 (LL) <6 (LL) <6 (LL) <6 (LL) 5 (LL)   Liver-spleen scan 08/01/2015 shows no evidence of accessory spleens. The patient was started on eltrombopag/ Promacta, but never obtained it secondary to cost.  INTERVAL HISTORY: Scott Waters presented to the ED 08/12/2015 with c/o persistent mucosal bleeding. He had a near-syncopal episode in the ED.  He also c/o dark stools.In the ED he was found to be be borderline hypotensive, Hb 9.0, platelets 5K. He was admitted for further evaluation and management and consult to hematology was requested. I saw him in his room 08/13/2015  REVIEW OF SYSTEMS: He had persistent bleeding from his gums, no epistaxis, no severe or unusual headache, no sudden visual changes, no nausea or vomiting. Stools turned black past 2 days. Does drink moderately but regularly and also uses NSAIDS at times. Denies fever, rash, adenopathy, unexplained weight loss. Still working -- "I got to." Rest of ROS noncontributory  PAST MEDICAL HISTORY: Past Medical History  Diagnosis Date  . Seasonal allergies   . Hypertension   . Hyperlipidemia   . Hx of colonic polyps 2007  . Gout 04/27/2011    One episode of joint pain at [facility]   Presumed    . Abnormal LFTs 04/27/2011  . Thrombocytopenia 04/15/2014    admitted for  IVIG  . GERD (gastroesophageal reflux disease)     "mild"  . Osteoarthritis   . Arthritis     "knees" (04/16/2014)  . 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 Lower leg edema   . Anxiety   . Depression   . Urinary frequency   . H/O splenectomy     PAST SURGICAL HISTORY: Past Surgical History  Procedure Laterality Date  . Polypectomy      . Multiple tooth extractions  2014    "took out all the top teeth"  . Tonsillectomy and adenoidectomy  1973  . Laceration repair Right ~ 1983    "inner knee; steel tubing fell on me"  . Shoulder arthroscopy Left 08/2001    "removed bone spur"  . Laparoscopic splenectomy N/A 11/08/2014    Procedure: LAPAROSCOPIC SPLENECTOMY;  Surgeon: Fanny Skates, MD;  Location: [facility];  Service: General;  Laterality: N/A;  . Laparotomy N/A 11/14/2014    Procedure: EXPLORATORY LAPAROTOMY, RELEASE SMALL BOWEL OBSTRUCTION, INCISIONAL HERNIA REPAIR;  Surgeon: Fanny Skates, MD;  Location: [facility];  Service: General;  Laterality: N/A;    FAMILY HISTORY Family History  Problem Relation Age of Onset  . Colon cancer Father 4    Died at 51  . Hypertension Mother   . Diabetes Mother   . Kidney disease Mother     diailysis from dm   . Stroke      SOCIAL HISTORY:  The patient works for a Agricultural [employer]. He is divorced. At home it's just him and his (only) son Scott Waters-- he works in [employer]. The patient is a member of a [employer]    ADVANCED DIRECTIVES: the patient's sister Scott Waters is his HCPOA. She can be reached at [phone]  HEALTH MAINTENANCE: Social History  Substance Use Topics  . Smoking status: Former Smoker -- 1.00 packs/day for 33 years    Types: Cigarettes    Start date: 10/02/1970    Quit date: 01/02/2003  . Smokeless tobacco: Former Systems developer    Types: Snuff, Sarina Ser    Quit date: 10/03/2003     Comment: quit 11 years ago  . Alcohol Use: 16.8 oz/week    14 Cans of beer, 14 Shots of liquor per week     Comment: 2 beers daily, 2 shots liquor daily     No Known Allergies  Current Facility-Administered Medications  Medication Dose Route Frequency Neytiri Asche Last Rate Last Dose  . 0.9 %  sodium chloride infusion   Intravenous Once Janece Canterbury, MD      . acetaminophen (TYLENOL) tablet 650 mg  650 mg Oral Q6H PRN Janece Canterbury, MD       Or  . acetaminophen (TYLENOL)  suppository 650 mg  650 mg Rectal Q6H PRN Janece Canterbury, MD      . acetaminophen (TYLENOL) tablet 650 mg  650 mg Oral Q24H Chauncey Cruel, MD   650 mg at 08/13/15 1109  . aminocaproic acid (AMICAR) 25 % solution 4 g  4 g Oral 4 times per day Janece Canterbury, MD   4 g at 08/13/15 1058  . diphenhydrAMINE (BENADRYL) capsule 25 mg  25 mg Oral Q24H Chauncey Cruel, MD   25 mg at 08/13/15 1109  . eltrombopag (PROMACTA) tablet 50 mg  50 mg Oral Daily Janece Canterbury, MD   50 mg at 08/13/15 1110  . fluticasone (FLONASE) 50 MCG/ACT nasal spray 2 spray  2 spray Each Nare Daily Janece Canterbury, MD   2 spray  at 08/13/15 1057  . folic acid (FOLVITE) tablet 1 mg  1 mg Oral Daily Janece Canterbury, MD   1 mg at 08/13/15 1109  . Immune Globulin 10% (OCTAGAM) IV infusion 90 g  1 g/kg Intravenous Q24H Chauncey Cruel, MD 52.5 mL/hr at 08/13/15 1238    . loratadine (CLARITIN) tablet 10 mg  10 mg Oral Daily Janece Canterbury, MD   10 mg at 08/13/15 1109  . methylPREDNISolone sodium succinate (SOLU-MEDROL) 125 mg/2 mL injection 60 mg  60 mg Intravenous Q24H Chauncey Cruel, MD   60 mg at 08/13/15 1110  . multivitamin with minerals tablet 1 tablet  1 tablet Oral q morning - 10a Janece Canterbury, MD   1 tablet at 08/13/15 1109  . pantoprazole (PROTONIX) EC tablet 40 mg  40 mg Oral BID AC Janece Canterbury, MD   40 mg at 08/13/15 0819  . simvastatin (ZOCOR) tablet 40 mg  40 mg Oral q morning - 10a Janece Canterbury, MD   40 mg at 08/13/15 1109   Facility-Administered Medications Ordered in Other Encounters  Medication Dose Route Frequency Gerrick Ray Last Rate Last Dose  . 0.9 %  sodium chloride infusion   Intravenous Once Fanny Skates, MD      . 0.9 %  sodium chloride infusion   Intravenous Once Fanny Skates, MD        OBJECTIVE: middle aged [place] American man examined in bed  Filed Vitals:   08/13/15 1152  BP: 124/71  Pulse: 93  Temp: 98.7 F (37.1 C)  Resp:      Body mass index is 27.99 kg/(m^2).    Ocular: Sclerae unicteric, pupils equal,EOMs intact Ear-nose-throat: Oropharynx no bleeding currently Lymphatic: No cervical or supraclavicular adenopathy Lungs no rales or rhonchi, auscultated anterolaterally Heart regular rate and rhythm, no murmur appreciated Abd soft, nontender, positive bowel sounds, s/p splenectomy MSK no joint erythema/ edema Neuro: non-focal, well-oriented, appropriate affect   LAB RESULTS:  CMP     Component Value Date/Time   NA 135 08/12/2015 2306   NA 139 07/20/2014 0902   K 3.6 08/12/2015 2306   K 3.3 07/20/2014 0902   CL 104 08/12/2015 2306   CL 103 07/20/2014 0902   CO2 24 08/12/2015 2306   CO2 27 07/20/2014 0902   GLUCOSE 109* 08/12/2015 2306   GLUCOSE 133* 07/20/2014 0902   BUN 21* 08/12/2015 2306   BUN 11 07/20/2014 0902   CREATININE 0.83 08/12/2015 2306   CREATININE 0.9 07/20/2014 0902   CALCIUM 8.6* 08/12/2015 2306   CALCIUM 8.6 07/20/2014 0902   PROT 6.3 05/03/2015 0827   PROT 6.9 07/20/2014 0902   ALBUMIN 4.0 05/03/2015 0827   AST 32 05/03/2015 0827   AST 51* 07/20/2014 0902   ALT 33 05/03/2015 0827   ALT 36 07/20/2014 0902   ALKPHOS 113 05/03/2015 0827   ALKPHOS 75 07/20/2014 0902   BILITOT 0.5 05/03/2015 0827   BILITOT 0.80 07/20/2014 0902   GFRNONAA >60 08/12/2015 2306   GFRAA >60 08/12/2015 2306    INo results found for: SPEP, UPEP  Lab Results  Component Value Date   WBC 14.4* 08/12/2015   NEUTROABS 6.4 08/09/2015   HGB 9.0* 08/12/2015   HCT 27.1* 08/12/2015   MCV 90.6 08/12/2015   PLT 5* 08/12/2015    @LASTCHEMISTRY @  No results found for: LABCA2  No components found for: GHWEX937   Recent Labs Lab 08/13/15 0301  INR 1.07    Urinalysis    Component Value Date/Time  COLORURINE YELLOW 04/27/2014 1933   APPEARANCEUR CLEAR 04/27/2014 1933   LABSPEC 1.012 04/27/2014 1933   PHURINE 6.0 04/27/2014 [place] 04/27/2014 1933   HGBUR NEGATIVE 04/27/2014 1933   HGBUR negative  03/14/2009 0848   BILIRUBINUR NEGATIVE 04/27/2014 1933   BILIRUBINUR n 04/16/2011   KETONESUR NEGATIVE 04/27/2014 1933   PROTEINUR NEGATIVE 04/27/2014 1933   PROTEINUR n 04/16/2011   UROBILINOGEN 0.2 04/27/2014 1933   UROBILINOGEN 0.2 04/16/2011   NITRITE NEGATIVE 04/27/2014 1933   NITRITE n 04/16/2011   LEUKOCYTESUR NEGATIVE 04/27/2014 1933    STUDIES: Nm Liver Spleen Scan  08/01/2015   CLINICAL DATA:  Recurrent thrombocytopenia.  EXAM: NUCLEAR MEDICINE LIVER - SPLEEN SCAN  TECHNIQUE: Abdominal images were obtained in multiple projections after intravenous injection of radiopharmaceutical.  RADIOPHARMACEUTICALS:  5.03 mCi Tc-51m sulfur colloid IV  COMPARISON:  11/14/2014  FINDINGS: Normal physiologic activity is identified within the liver. no additional foci of increased uptake identified to suggest residual splenic tissue.  IMPRESSION: 1. No evidence for residual or recurrent splenic tissue.   Electronically Signed   By: Kerby Moors M.D.   On: 08/01/2015 15:21    ASSESSMENT: 59 y.o. [place], [place] man with a history of ITP initially diagnosed May 2014, s/p multiple treatments as detailed in HPI, s/p splenectomy December 2015 with temporary response, s/p 4 doses of Nplate August 4315 w/o response, awaiting start of eltrombopag/ Promacta, delayed because of finances  PLAN: I have written for IVIG to be given today and tomorrow. This should temporarily correct the platelet count, giving the patient time to start on his Promacta.   He has already received one unit PRBCs, with repeat CBC pending.   He tells me SW at Dr Antonieta Pert is working on getting the copay for the Promacta taken care of. SW here should follow up-- pharmacy has placed that request.  I will alert Dr [name] re patient's admission. Please let me know if i can be of further help/    Chauncey Cruel, MD   08/13/2015 1:00 PM Medical Oncology and Hematology [facility] [place], [place]  [address] Tel. [phone]    Fax. [phone]

## 2015-08-13 NOTE — Progress Notes (Signed)
Utilization Review Completed.Dowell, Deborah T9/18/2016  

## 2015-08-13 NOTE — H&P (Signed)
[facility] History and Physical  Scott Waters JOA:[id number] DOB: 1956-01-28 DOA: 08/12/2015  Referring physician:  April Palumbo PCP:  [name], MD   Chief Complaint:  Ongoing bleeding  HPI:  The patient is a 59 y.o. year-old male with history of refractory ITP, HTN, HLD who presents with ongoing gum bleeding.  The patient is followed by Dr. 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  He has had platelets less than or equal to 6000 and has been receiving Nplate (romiplostim) q4 weeks, first dose on 07/05/2015, then on 07/26/2015.  He did not respond so he was given a prescription for Promacta (Eltrombopag) on 9/7, however, he could not afford teh $220 copay.  He was given information about assistance programs.  About four days prior to admission, he developed gum bleeding from around several teeth, but most prominently the lower four incisors.  The bleeding has not stopped and he spits the blood out in containers.  He has tried brushing his teeth vigorously, picking out the blood clots, swishing with alcohol-based mouthwash, swishing with hydrogen peroxide solution, and swishing with saline, none of which have helped and most of which has made his bleeding worse.  He has felt a little more fatigued, but denies SOB.  He has had dark stools ever since his gums started bleeding and he has no history of ulcers.  He takes ibuprofen and drinks alcohol daily, 2-4 drinks.  His last drink was 2 days ago.  He came to the emergency department because his bleeding was not stopping. While in the emergency department, he had an episode of lightheadedness without syncope or head trauma.  At the time, he denied focal numbness, tingling, weakness, slurred speech, or facial droop.  He denies epistaxis.  In the emergency department, his vital signs were notable for mild hypertension of 93/40, pulse variable from 50s to low 100s. His white blood cell count was 14.4, hemoglobin 9 with a baseline of 12,  platelets 5.  He has been ordered blood transfusion for symptomatic anemia and is being admitted to observation.  Review of Systems:  General:  Denies fevers, chills, weight loss or gain HEENT:  Denies changes to hearing and vision, rhinorrhea, sinus congestion, sore throat CV:  Denies chest pain and palpitations, lower extremity edema.  PULM:  Denies SOB, wheezing, cough.   GI:  Denies nausea, vomiting, constipation, diarrhea.   GU:  Denies dysuria, frequency, urgency ENDO:  Denies polyuria, polydipsia.   HEME:  Denies hematemesis, + melena, + abnormal bruising and bleeding.  LYMPH:  Denies lymphadenopathy.   MSK:  Denies arthralgias, myalgias.   DERM:  Denies skin rash or ulcer.   NEURO:  Denies focal numbness, weakness, slurred speech, confusion, facial droop.  PSYCH:  Denies anxiety and depression.    Past Medical History  Diagnosis Date  . Seasonal allergies   . Hypertension   . Hyperlipidemia   . Hx of colonic polyps 2007  . Gout 04/27/2011    One episode of joint pain at [facility]   Presumed    . Abnormal LFTs 04/27/2011  . Thrombocytopenia 04/15/2014    admitted for IVIG  . GERD (gastroesophageal reflux disease)     "mild"  . Osteoarthritis   . Arthritis     "knees" (04/16/2014)  . 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 Lower leg edema   . Anxiety   . Depression   . Urinary frequency   . H/O splenectomy    Past Surgical History  Procedure  Laterality Date  . Polypectomy    . Multiple tooth extractions  2014    "took out all the top teeth"  . Tonsillectomy and adenoidectomy  1973  . Laceration repair Right ~ 1983    "inner knee; steel tubing fell on me"  . Shoulder arthroscopy Left 08/2001    "removed bone spur"  . Laparoscopic splenectomy N/A 11/08/2014    Procedure: LAPAROSCOPIC SPLENECTOMY;  Surgeon: Fanny Skates, MD;  Location: [facility];  Service: General;  Laterality: N/A;  . Laparotomy N/A 11/14/2014    Procedure: EXPLORATORY LAPAROTOMY, RELEASE  SMALL BOWEL OBSTRUCTION, INCISIONAL HERNIA REPAIR;  Surgeon: Fanny Skates, MD;  Location: [facility];  Service: General;  Laterality: N/A;   Social History:  reports that he quit smoking about 12 years ago. His smoking use included Cigarettes. He started smoking about 44 years ago. He has a 33 pack-year smoking history. He quit smokeless tobacco use about 11 years ago. His smokeless tobacco use included Snuff and Chew. He reports that he drinks about 16.8 oz of alcohol per week. He reports that he uses illicit drugs.  No Known Allergies  Family History  Problem Relation Age of Onset  . Colon cancer Father 51    Died at 10  . Hypertension Mother   . Diabetes Mother   . Kidney disease Mother     diailysis from dm   . Stroke       Prior to Admission medications   Medication Sig Start Date End Date Taking? Authorizing Provider  amLODipine-olmesartan (AZOR) 10-40 MG per tablet Take 1 tablet by mouth every morning.    Yes Historical Provider, MD  CINNAMON PO Take 1,000 mg by mouth every morning.   Yes Historical Provider, MD  eltrombopag (PROMACTA) 50 MG tablet Take 1 tablet (50 mg total) by mouth daily. Take on an empty stomach 1 hour before a meal or 2 hours after 08/03/15  Yes [name], MD  fexofenadine (ALLEGRA) 180 MG tablet Take 180 mg by mouth every morning.   Yes Historical Provider, MD  fluticasone (FLONASE) 50 MCG/ACT nasal spray instill 2 sprays into each nostril once daily 11/24/14  Yes Burnis Medin, MD  folic acid (FOLVITE) 1 MG tablet Take 1 tablet (1 mg total) by mouth daily. 07/12/15  Yes [name], MD  ibuprofen (ADVIL,MOTRIN) 200 MG tablet Take 400 mg by mouth 2 (two) times daily.   Yes Historical Provider, MD  Multiple Vitamin (MULTIVITAMIN WITH MINERALS) TABS tablet Take 1 tablet by mouth every morning.    Yes Historical Provider, MD  pantoprazole (PROTONIX) 40 MG tablet Take 1 tablet (40 mg total) by mouth daily. 05/23/14  Yes [name], MD  potassium  chloride (K-DUR,KLOR-CON) 10 MEQ tablet take 2 tablets by mouth once daily 05/31/15  Yes Burnis Medin, MD  Probiotic Product (PROBIOTIC DAILY PO) Take 1 tablet by mouth every morning.    Yes Historical Provider, MD  simvastatin (ZOCOR) 40 MG tablet Take 40 mg by mouth every morning.    Yes Historical Provider, MD  VITAMIN K PO Take 100 mcg by mouth every morning. OTC med 07/04/14  Yes Historical Provider, MD  aminocaproic acid (AMICAR) 25 % solution Rinse your mouth out with 1 teaspoon 4 times a day. Patient not taking: Reported on 08/12/2015 08/09/15   [name], MD  glucosamine-chondroitin 500-400 MG tablet Take 1 tablet by mouth every morning. Patient not taking: Reported on 08/12/2015 01/04/15   [name], MD  Physical Exam: Filed Vitals:   08/13/15 0200 08/13/15 0215 08/13/15 0230 08/13/15 0245  BP: 111/57 102/60 114/60 102/52  Pulse: 87 91 91 93  Temp:      TempSrc:      Resp:      SpO2: 100% 100% 100% 100%     General:  Adult male, no acute distress  Eyes:  PERRL, anicteric, non-injected.  ENT:  Nares clear.   MMM.  Dentures uppers, several teeth missing lowers. He has his natural lower incisors and he has dark blood and blood clots surrounding his incisors with some fresh blood that is oozing from the right lower incisor. There is a container with several 100 cc of sputum and blood which he has spit out just over the last hour.  Neck:  Supple without TM or JVD.    Lymph:  No cervical, supraclavicular, or submandibular LAD.  Cardiovascular:  RRR, normal S1, S2, without m/r/g.  2+ pulses, warm extremities  Respiratory:  CTA bilaterally without increased WOB.  Abdomen:  NABS.  Soft, ND/NT.    Skin:  No ulcers. He has several bruises and a petechial rash that extends from the knees down to the feet..  Musculoskeletal:  Normal bulk and tone.  No LE edema.  Psychiatric:  A & O x 4.  Appropriate affect.  Neurologic:  CN 3-12 intact.  5/5 strength.  Sensation  intact.  Labs on Admission:  Basic Metabolic Panel:  Recent Labs Lab 08/12/15 2306  NA 135  K 3.6  CL 104  CO2 24  GLUCOSE 109*  BUN 21*  CREATININE 0.83  CALCIUM 8.6*   Liver Function Tests: No results for input(s): AST, ALT, ALKPHOS, BILITOT, PROT, ALBUMIN in the last 168 hours. No results for input(s): LIPASE, AMYLASE in the last 168 hours. No results for input(s): AMMONIA in the last 168 hours. CBC:  Recent Labs Lab 08/09/15 1355 08/12/15 2306  WBC 9.6 14.4*  NEUTROABS 6.4  --   HGB 11.7* 9.0*  HCT 35.1* 27.1*  MCV 91 90.6  PLT <6* 5*   Cardiac Enzymes: No results for input(s): CKTOTAL, CKMB, CKMBINDEX, TROPONINI in the last 168 hours.  BNP (last 3 results) No results for input(s): BNP in the last 8760 hours.  ProBNP (last 3 results) No results for input(s): PROBNP in the last 8760 hours.  CBG: No results for input(s): GLUCAP in the last 168 hours.  Radiological Exams on Admission: No results found.  EKG: Pending  Assessment/Plan Active Problems:   Essential hypertension   ITP (idiopathic thrombocytopenic purpura)   Symptomatic anemia  ---  Symptomatic anemia with melena which I suspect is from his gum bleeding since dark stools started around the time that his gum started bleeding. Alternatively, he could have some alcoholic gastritis or ulcers. -  Start full liquid diet so that he avoids chewing and dislodging the blood clots that are forming around his teeth -  Stopped alcohol swishes -  Start amicar  -  Increase to BID PPI -  Encouraged ongoing alcohol cessation -  Stopped NSAIDs -  Transfuse 1 unit PRBC  ITP, chronic refractory -  Platelet transfusions are unlikely to be helpful in the setting -  Added Dr. [name] to the consultation list -  Start promactin  -  Amicar as above  Essential hypertension with current hypotension likely secondary to blood loss.  No symptoms to suggest sepsis or infection. -  Hold blood pressure  medications -  IV fluids  Leukocytosis is  likely reactive secondary to his anemia -  Monitor for signs of infection -  Trend white blood cell count  Diet:  Full liquid with supplements Access:  PIV IVF:  Yes Proph:  SCDs  Code Status: Full code Family Communication: Patient alone Disposition Plan: Admit to telemetry  Time spent: 60 min Janece Canterbury [facility] Pager [phone]  If 7PM-7AM, please contact night-coverage [email or web address] Password [email or web address] 08/13/2015, 3:35 AM

## 2015-08-13 NOTE — Progress Notes (Signed)
[facility]                                                                              Patient Demographics  Scott Waters, is a 59 y.o. male, DOB - 1956/09/11, JEH:[id number]  Admit date - 08/12/2015   Admitting Physician Janece Canterbury, MD  Outpatient Primary MD for the patient is [name], MD  LOS - 0   Chief Complaint  Patient presents with  . Coagulation Disorder       Brief HPI   Per Dr. Janece Canterbury 9/17 Patient is a 59 y.o. year-old male with history of refractory ITP, HTN, HLD who presents with ongoing gum bleeding. The patient is followed by Dr. 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 He has had platelets less than or equal to 6000 and has been receiving Nplate (romiplostim) q4 weeks, first dose on 07/05/2015, then on 07/26/2015. He did not respond so he was given a prescription for Promacta (Eltrombopag) on 9/7, however, he could not afford $220 copay. He was given information about assistance programs. About four days prior to admission, he developed gum bleeding from around several teeth, but most prominently the lower four incisors. The bleeding did not stop and he was spitting out the blood in the containers. He tried brushing his teeth vigorously, picking out the blood clots, swishing with alcohol-based mouthwash, swishing with hydrogen peroxide solution, and swishing with saline, none of which have helped and most of which has made his bleeding worse. He also reported dark stools ever since his gums started bleeding and he has no history of ulcers. He takes ibuprofen and drinks alcohol daily, 2-4 drinks. His last drink was 2 days prior to admission. He came to the emergency department because his bleeding was not stopping. While in the emergency department, he had an episode of lightheadedness without syncope or head trauma. At the time, he denied focal numbness, tingling, weakness, slurred speech, or facial droop. He denies  epistaxis. In the emergency department, his vital signs were notable for mild hypertension of 93/40, pulse variable from 50s to low 100s. His white blood cell count was 14.4, hemoglobin 9 with a baseline of 12, platelets 5. Patient was admitted for further workup.     Assessment & Plan    Principal Problem:   ITP (idiopathic thrombocytopenic purpura)  - Patient has a known history of chronic refractory ITP and follows Dr. [name] - Continues to have the gum bleeding, patient started on promactin, amicar - Call hematology consult, discussed with Dr. Jana Hakim, starting IV Solu-Medrol, IVIG, will evaluate patient today   Active problems Symptomatic anemia with melena which I suspect is from his gum bleeding since dark stools started around the time that his gum started bleeding. Alternatively, he could have some alcoholic gastritis or ulcers. - For now continue clear liquid diet only, PPI,  - Follow CBC - Discontinue NSAIDs   Essential hypertension with current hypotension likely secondary to blood loss. No symptoms to suggest sepsis or infection. - Hold blood pressure medications -Continue IV fluids  Leukocytosis is likely reactive secondary to his anemia - Monitor for signs of infection, Trend  white blood cell count  Code Status: Full CODE STATUS  Family Communication: Discussed in detail with the patient, all imaging results, lab results explained to the patient *   Disposition Plan: Not medically ready  Time Spent in minutes   25 minutes  Procedures  None  Consults   Hematology  DVT Prophylaxis  SCD's  Medications  Scheduled Meds: . sodium chloride   Intravenous Once  . acetaminophen  650 mg Oral Q24H  . aminocaproic acid  4 g Oral 4 times per day  . diphenhydrAMINE  25 mg Oral Q24H  . eltrombopag  50 mg Oral Daily  . fluticasone  2 spray Each Nare Daily  . folic acid  1 mg Oral Daily  . IMMUNE GLOBULIN 10% (HUMAN) IV - For Fluid Restriction Only  1 g/kg  Intravenous Q24H  . loratadine  10 mg Oral Daily  . methylPREDNISolone (SOLU-MEDROL) injection  60 mg Intravenous Q24H  . multivitamin with minerals  1 tablet Oral q morning - 10a  . pantoprazole  40 mg Oral BID AC  . simvastatin  40 mg Oral q morning - 10a   Continuous Infusions:  PRN Meds:.acetaminophen **OR** acetaminophen   Antibiotics   Anti-infectives    None        Subjective:   Scott Waters was seen and examined today.  Continues to have gum bleeding, spitting out blood in the container. No fevers, chills, chest pain or abdominal pain. Patient denies dizziness, N/V/D/C, new weakness, numbess, tingling. No acute events overnight.    Objective:   Blood pressure 124/71, pulse 93, temperature 98.7 F (37.1 C), temperature source Oral, resp. rate 20, height 5' 9.6" (1.768 m), weight 87.5 kg (192 lb 14.4 oz), SpO2 100 %.  Wt Readings from Last 3 Encounters:  08/13/15 87.5 kg (192 lb 14.4 oz)  08/02/15 87.091 kg (192 lb)  07/10/15 87.998 kg (194 lb)     Intake/Output Summary (Last 24 hours) at 08/13/15 1229 Last data filed at 08/13/15 0933  Gross per 24 hour  Intake    484 ml  Output    400 ml  Net     84 ml    Exam  General: Alert and oriented x 3, NAD  HEENT:  PERRLA, EOMI, Anicteric Sclera, mucous membranes moist. oozing blood from the gums and lower incisors  Neck: Supple, no JVD, no masses  CVS: S1 S2 auscultated, no rubs, murmurs or gallops. Regular rate and rhythm.  Respiratory: Clear to auscultation bilaterally, no wheezing, rales or rhonchi  Abdomen: Soft, nontender, nondistended, + bowel sounds  Ext: no cyanosis clubbing or edema  Neuro: AAOx3, Cr N's II- XII. Strength 5/5 upper and lower extremities bilaterally  Skin: bruises, petechial rash from the knees down to feet  Psych: Normal affect and demeanor, alert and oriented x3    Data Review   Micro Results No results found for this or any previous visit (from the past 240  hour(s)).  Radiology Reports Nm Liver Spleen Scan  08/01/2015   CLINICAL DATA:  Recurrent thrombocytopenia.  EXAM: NUCLEAR MEDICINE LIVER - SPLEEN SCAN  TECHNIQUE: Abdominal images were obtained in multiple projections after intravenous injection of radiopharmaceutical.  RADIOPHARMACEUTICALS:  5.03 mCi Tc-26m sulfur colloid IV  COMPARISON:  11/14/2014  FINDINGS: Normal physiologic activity is identified within the liver. no additional foci of increased uptake identified to suggest residual splenic tissue.  IMPRESSION: 1. No evidence for residual or recurrent splenic tissue.   Electronically Signed   By: Lovena Le  Clovis Riley M.D.   On: 08/01/2015 15:21    CBC  Recent Labs Lab 08/09/15 1355 08/12/15 2306  WBC 9.6 14.4*  HGB 11.7* 9.0*  HCT 35.1* 27.1*  PLT <6* 5*  MCV 91 90.6  MCH 30.2 30.1  MCHC 33.3 33.2  RDW 14.3 15.0  LYMPHSABS 2.1  --   EOSABS 0.1  --   BASOSABS 0.1  --     Chemistries   Recent Labs Lab 08/12/15 2306  NA 135  K 3.6  CL 104  CO2 24  GLUCOSE 109*  BUN 21*  CREATININE 0.83  CALCIUM 8.6*   ------------------------------------------------------------------------------------------------------------------ estimated creatinine clearance is 103.5 mL/min (by C-G formula based on Cr of 0.83). ------------------------------------------------------------------------------------------------------------------ No results for input(s): HGBA1C in the last 72 hours. ------------------------------------------------------------------------------------------------------------------ No results for input(s): CHOL, HDL, LDLCALC, TRIG, CHOLHDL, LDLDIRECT in the last 72 hours. ------------------------------------------------------------------------------------------------------------------ No results for input(s): TSH, T4TOTAL, T3FREE, THYROIDAB in the last 72 hours.  Invalid input(s):  FREET3 ------------------------------------------------------------------------------------------------------------------ No results for input(s): VITAMINB12, FOLATE, FERRITIN, TIBC, IRON, RETICCTPCT in the last 72 hours.  Coagulation profile  Recent Labs Lab 08/13/15 0301  INR 1.07    No results for input(s): DDIMER in the last 72 hours.  Cardiac Enzymes No results for input(s): CKMB, TROPONINI, MYOGLOBIN in the last 168 hours.  Invalid input(s): CK ------------------------------------------------------------------------------------------------------------------ Invalid input(s): POCBNP  No results for input(s): GLUCAP in the last 72 hours.   RAI,RIPUDEEP M.D. [facility] 08/13/2015, 12:29 PM  Pager: [phone] Between 7am to 7pm - call Pager - [phone]  After 7pm go to [email or web address] - password [email or web address]  Call night coverage person covering after 7pm

## 2015-08-14 LAB — CBC
HEMATOCRIT: 23.6 % — AB (ref 39.0–52.0)
Hemoglobin: 7.9 g/dL — ABNORMAL LOW (ref 13.0–17.0)
MCH: 30.3 pg (ref 26.0–34.0)
MCHC: 33.5 g/dL (ref 30.0–36.0)
MCV: 90.4 fL (ref 78.0–100.0)
RBC: 2.61 MIL/uL — ABNORMAL LOW (ref 4.22–5.81)
RDW: 15.4 % (ref 11.5–15.5)
WBC: 19.3 10*3/uL — AB (ref 4.0–10.5)

## 2015-08-14 MED ORDER — DEXAMETHASONE SODIUM PHOSPHATE 10 MG/ML IJ SOLN
40.0000 mg | INTRAMUSCULAR | Status: AC
  Administered 2015-08-14 – 2015-08-16 (×3): 40 mg via INTRAVENOUS
  Filled 2015-08-14 (×4): qty 4

## 2015-08-14 NOTE — Progress Notes (Signed)
[facility]                                                                              Patient Demographics  Scott Waters, is a 59 y.o. male, DOB - Jun 24, 1956, IBB:[id number]  Admit date - 08/12/2015   Admitting Physician Janece Canterbury, MD  Outpatient Primary MD for the patient is [name], MD  LOS - 1   Chief Complaint  Patient presents with  . Coagulation Disorder       Brief HPI   Per Dr. Janece Canterbury 9/17 Patient is a 59 y.o. year-old male with history of refractory ITP, HTN, HLD who presents with ongoing gum bleeding. The patient is followed by Dr. 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 He has had platelets less than or equal to 6000 and has been receiving Nplate (romiplostim) q4 weeks, first dose on 07/05/2015, then on 07/26/2015. He did not respond so he was given a prescription for Promacta (Eltrombopag) on 9/7, however, he could not afford $220 copay. He was given information about assistance programs. About four days prior to admission, he developed gum bleeding from around several teeth, but most prominently the lower four incisors. The bleeding did not stop and he was spitting out the blood in the containers. He tried brushing his teeth vigorously, picking out the blood clots, swishing with alcohol-based mouthwash, swishing with hydrogen peroxide solution, and swishing with saline, none of which have helped and most of which has made his bleeding worse. He also reported dark stools ever since his gums started bleeding and he has no history of ulcers. He takes ibuprofen and drinks alcohol daily, 2-4 drinks. His last drink was 2 days prior to admission. He came to the emergency department because his bleeding was not stopping. While in the emergency department, he had an episode of lightheadedness without syncope or head trauma. At the time, he denied focal numbness, tingling, weakness, slurred speech, or facial droop. He denies  epistaxis. In the emergency department, his vital signs were notable for mild hypertension of 93/40, pulse variable from 50s to low 100s. His white blood cell count was 14.4, hemoglobin 9 with a baseline of 12, platelets 5. Patient was admitted for further workup.     Assessment & Plan    Principal Problem:   ITP (idiopathic thrombocytopenic purpura) presented with gum bleeding- improving - Patient has a known history of chronic refractory ITP and follows Dr. [name] - hematology consult called, seen by Dr. Jana Hakim, started IV Solu-Medrol, changed to Decadron today, IVIG - Gum bleeding has improved however no improvement in the platelet count. Hematology, Dr. [name] recommended keeping his platelet count above 20,000    Active problems Symptomatic anemia with melena which I suspect is from his gum bleeding since dark stools started around the time that his gum started bleeding. Alternatively, he could have some alcoholic gastritis or ulcers. -Since gum bleeding has stopped, patient wants to try soft diet, will advance.  - Per hematology, no need to transfuse at this time   Essential hypertension with current hypotension likely secondary to blood loss. No symptoms to suggest sepsis or infection. -Hold  blood pressure medications -Continue IV fluids  Leukocytosis is likely reactive secondary to his anemia -Monitor for signs of infection, Trend white blood cell count, no fevers   Hyperlipidemia Zocor has been stopped by hematology.  Code Status: Full CODE STATUS  Family Communication: Discussed in detail with the patient, all imaging results, lab results explained to the patient *   Disposition Plan: Not medically ready  Time Spent in minutes   25 minutes  Procedures  None  Consults   Hematology  DVT Prophylaxis  SCD's  Medications  Scheduled Meds: . sodium chloride   Intravenous Once  . aminocaproic acid  4 g Oral 4 times per day  . dexamethasone  40 mg  Intravenous Q24H  . eltrombopag  50 mg Oral Daily  . fluticasone  2 spray Each Nare Daily  . folic acid  1 mg Oral Daily  . IMMUNE GLOBULIN 10% (HUMAN) IV - For Fluid Restriction Only  1 g/kg Intravenous Q24H  . loratadine  10 mg Oral Daily  . multivitamin with minerals  1 tablet Oral q morning - 10a  . pantoprazole  40 mg Oral BID AC   Continuous Infusions:  PRN Meds:.acetaminophen **OR** acetaminophen   Antibiotics   Anti-infectives    None        Subjective:   Hiren Peplinski was seen and examined today.   gum bleeding has significantly improved. No fevers, chills, or any productive cough. No chest pain or abdominal pain. Patient denies dizziness, N/V/D/C, new weakness, numbess, tingling. No acute events overnight.    Objective:   Blood pressure 108/61, pulse 88, temperature 98.4 F (36.9 C), temperature source Oral, resp. rate 18, height 5' 9.6" (1.768 m), weight 87.5 kg (192 lb 14.4 oz), SpO2 100 %.  Wt Readings from Last 3 Encounters:  08/13/15 87.5 kg (192 lb 14.4 oz)  08/02/15 87.091 kg (192 lb)  07/10/15 87.998 kg (194 lb)     Intake/Output Summary (Last 24 hours) at 08/14/15 1102 Last data filed at 08/14/15 0740  Gross per 24 hour  Intake    150 ml  Output   1300 ml  Net  -1150 ml    Exam  General: Alert and oriented x 3, NAD  HEENT:  PERRLA, EOMI, Anicteric Sclera, mucous membranes moist.  dried blood on the gums/incisors but no active bleeding.   Neck: Supple, no JVD, no masses  CVS: S1 S2 clear, RRR  Respiratory: CTAB  Abdomen: Soft, nontender, nondistended, + bowel sounds  Ext: no cyanosis clubbing or edema  Neuro: no new deficits   Skin: bruises, petechial rash from the knees down to feet  Psych: Normal affect and demeanor, alert and oriented x3    Data Review   Micro Results No results found for this or any previous visit (from the past 240 hour(s)).  Radiology Reports Nm Liver Spleen Scan  08/01/2015   CLINICAL DATA:   Recurrent thrombocytopenia.  EXAM: NUCLEAR MEDICINE LIVER - SPLEEN SCAN  TECHNIQUE: Abdominal images were obtained in multiple projections after intravenous injection of radiopharmaceutical.  RADIOPHARMACEUTICALS:  5.03 mCi Tc-31m sulfur colloid IV  COMPARISON:  11/14/2014  FINDINGS: Normal physiologic activity is identified within the liver. no additional foci of increased uptake identified to suggest residual splenic tissue.  IMPRESSION: 1. No evidence for residual or recurrent splenic tissue.   Electronically Signed   By: Kerby Moors M.D.   On: 08/01/2015 15:21    CBC  Recent Labs Lab 08/09/15 1355 08/12/15 2306 08/13/15 1220 08/14/15  0506  WBC 9.6 14.4* 15.0* 19.3*  HGB 11.7* 9.0* 9.1* 7.9*  HCT 35.1* 27.1* 28.0* 23.6*  PLT <6* 5* >5* <5*  MCV 91 90.6 90.9 90.4  MCH 30.2 30.1 29.5 30.3  MCHC 33.3 33.2 32.5 33.5  RDW 14.3 15.0 15.1 15.4  LYMPHSABS 2.1  --  2.1  --   MONOABS  --   --  0.8  --   EOSABS 0.1  --  0.2  --   BASOSABS 0.1  --  0.0  --     Chemistries   Recent Labs Lab 08/12/15 2306 08/13/15 1220  NA 135 139  K 3.6 3.7  CL 104 108  CO2 24 24  GLUCOSE 109* 106*  BUN 21* 15  CREATININE 0.83 0.76  CALCIUM 8.6* 8.6*   ------------------------------------------------------------------------------------------------------------------ estimated creatinine clearance is 107.4 mL/min (by C-G formula based on Cr of 0.76). ------------------------------------------------------------------------------------------------------------------ No results for input(s): HGBA1C in the last 72 hours. ------------------------------------------------------------------------------------------------------------------ No results for input(s): CHOL, HDL, LDLCALC, TRIG, CHOLHDL, LDLDIRECT in the last 72 hours. ------------------------------------------------------------------------------------------------------------------ No results for input(s): TSH, T4TOTAL, T3FREE, THYROIDAB in the  last 72 hours.  Invalid input(s): FREET3 ------------------------------------------------------------------------------------------------------------------ No results for input(s): VITAMINB12, FOLATE, FERRITIN, TIBC, IRON, RETICCTPCT in the last 72 hours.  Coagulation profile  Recent Labs Lab 08/13/15 0301  INR 1.07    No results for input(s): DDIMER in the last 72 hours.  Cardiac Enzymes No results for input(s): CKMB, TROPONINI, MYOGLOBIN in the last 168 hours.  Invalid input(s): CK ------------------------------------------------------------------------------------------------------------------ Invalid input(s): POCBNP  No results for input(s): GLUCAP in the last 72 hours.   RAI,RIPUDEEP M.D. [facility] 08/14/2015, 11:02 AM  Pager: [phone] Between 7am to 7pm - call Pager - [phone]  After 7pm go to [email or web address] - password [email or web address]  Call night coverage person covering after 7pm

## 2015-08-14 NOTE — Progress Notes (Addendum)
I very much appreciate everybody's help. This is clearly an incredibly difficult situation. He has ITP that I would consider refractory.  He had not yet started Promacta.  I see that he is on it now  He is also getting IVIG.  I probably would use Decadron and not Solu-Medrol. I have changed this.  He is on Amicar mouth rinse because of oral bleeding.  We have done a thorough workup for the refractory ITP. He had a liver spleen scan done as an outpatient which did not show any  Hypersplenism or development of a new spleen. He has had a bone marrow biopsy done.  Again, this is a very difficult problem.  We will have to see how the IVIG with Decadron works. Hopefully, he will be overdue get the [facility] as an outpatient.  I would think that keeping his platelet count above 20-30,000 would be a reasonable call. I really do not think that he will get close to 100,000.  He's had no bleeding otherwise.  hhis vital signs are all stable. His temperature 98.4. Pulse 88. Blood pressure 108/61. His oral exam does show some gingival bleeding. He has some palatal petechia. Lungs are clear. Cardiac exam regular in rhythm with no murmurs, rubs or bruits. Abdomen is soft.. There is no fluid wave. He has a  Well-healed laparoscopy scar. Extremities shows no clubbing, cyanosis or edema. He has no petechia. Neurological exam shows no focal neurological deficits.  Mr. Bring has refractory ITP. This is a very difficult problem.  Hopefully, the IVIG with Decadron can get his platelet count of a little bit.  His hemoglobin is down a low bit. I would not  Transfuse him.  I will stop his Zocor. There is a very low incidence of thrombocytopenia  With Zocor. I do not see any problems with stopping this right now  [name] 1:5-7

## 2015-08-14 NOTE — Care Management Note (Signed)
Case Management Note  Patient Details  Name: Scott Waters MRN: [id number] Date of Birth: 1956-08-16  Subjective/Objective:                  Date: 08-14-15 Monday Spoke with patient at the bedside. Introduced self as Tourist information centre manager and explained role in discharge planning and how to be reached. Verified patient lives in [place] in a 1 story home, with son Scott Waters [phone], has DME none. Expressed potential need for no other DME. Verified patient anticipates to go home with son at time of discharge and will have part-time supervision by son at this time to best of their knowledge. Patient confirmed  needing help with Amicort  for bleeding gums, but is having Promacta mailed to the house today. Patient drives to MD appointments. Verified patient has PCP [facility].   Plan: CM will continue to follow for discharge planning and [facility] resources.   Scott Collet RN BSN CM [phone]   Action/Plan:   Expected Discharge Date:                  Expected Discharge Plan:  Home/Self Care  In-House Referral:     Discharge planning Services  CM Consult  Post Acute Care Choice:    Choice offered to:     DME Arranged:    DME Agency:     HH Arranged:    HH Agency:     Status of Service:  In process, will continue to follow  Medicare Important Message Given:    Date Medicare IM Given:    Medicare IM give by:    Date Additional Medicare IM Given:    Additional Medicare Important Message give by:     If discussed at [place] of Stay Meetings, dates discussed:    Additional Comments:  Scott Collet, RN 08/14/2015, 11:22 AM

## 2015-08-15 LAB — CBC
HEMATOCRIT: 21.7 % — AB (ref 39.0–52.0)
HEMOGLOBIN: 7.2 g/dL — AB (ref 13.0–17.0)
MCH: 29.9 pg (ref 26.0–34.0)
MCHC: 33.2 g/dL (ref 30.0–36.0)
MCV: 90 fL (ref 78.0–100.0)
Platelets: 5 10*3/uL — CL (ref 150–400)
RBC: 2.41 MIL/uL — ABNORMAL LOW (ref 4.22–5.81)
RDW: 15.3 % (ref 11.5–15.5)
WBC: 25.9 10*3/uL — ABNORMAL HIGH (ref 4.0–10.5)

## 2015-08-15 NOTE — Progress Notes (Signed)
[facility]                                                                              Patient Demographics  Scott Waters, is a 59 y.o. male, DOB - 1956/07/12, KQA:[id number]  Admit date - 08/12/2015   Admitting Physician Janece Canterbury, MD  Outpatient Primary MD for the patient is [name], MD  LOS - 2   Chief Complaint  Patient presents with  . Coagulation Disorder       Brief HPI   Per Dr. Janece Canterbury 9/17 Patient is a 59 y.o. year-old male with history of refractory ITP, HTN, HLD who presents with ongoing gum bleeding. The patient is followed by Dr. 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 He has had platelets less than or equal to 6000 and has been receiving Nplate (romiplostim) q4 weeks, first dose on 07/05/2015, then on 07/26/2015. He did not respond so he was given a prescription for Promacta (Eltrombopag) on 9/7, however, he could not afford $220 copay. He was given information about assistance programs. About four days prior to admission, he developed gum bleeding from around several teeth, but most prominently the lower four incisors. The bleeding did not stop and he was spitting out the blood in the containers. He tried brushing his teeth vigorously, picking out the blood clots, swishing with alcohol-based mouthwash, swishing with hydrogen peroxide solution, and swishing with saline, none of which have helped and most of which has made his bleeding worse. He also reported dark stools ever since his gums started bleeding and he has no history of ulcers. He takes ibuprofen and drinks alcohol daily, 2-4 drinks. His last drink was 2 days prior to admission. He came to the emergency department because his bleeding was not stopping. While in the emergency department, he had an episode of lightheadedness without syncope or head trauma. At the time, he denied focal numbness, tingling, weakness, slurred speech, or facial droop. He denies  epistaxis. In the emergency department, his vital signs were notable for mild hypertension of 93/40, pulse variable from 50s to low 100s. His white blood cell count was 14.4, hemoglobin 9 with a baseline of 12, platelets 5. Patient was admitted for further workup.     Assessment & Plan    Principal Problem:   ITP (idiopathic thrombocytopenic purpura) presented with gum bleeding- symptoms improving however platelet count still 5 - Patient has a known history of chronic refractory ITP and follows Dr. [name] - hematology consult called, seen by Dr. Jana Hakim and Dr [name], continue IV steroids. Patient was also placed on IVIG on 9/18 - Gum bleeding has improved however no improvement in the platelet count. Hematology, Dr. [name] recommended keeping his platelet count above 20,000  - Per hematology recommendations, IR consulted for bone marrow biopsy, on 9/21   Active problems Symptomatic anemia with melena which I suspect is from his gum bleeding since dark stools started around the time that his gum started bleeding. Alternatively, he could have some alcoholic gastritis or ulcers. -Since gum bleeding has stoppedcontinue soft diet - Per hematology, no need to transfuse at this time -  Monitor  hemoglobin closely, 7.2 today   Essential hypertension with current hypotension likely secondary to blood loss. No symptoms to suggest sepsis or infection. -Hold blood pressure medications  Leukocytosis is likely reactive secondary to IV Decadron -Monitor for signs of infection  Hyperlipidemia Zocor has been stopped by hematology.  Code Status: Full CODE STATUS  Family Communication: Discussed in detail with the patient, all imaging results, lab results explained to the patient    Disposition Plan: Not medically ready  Time Spent in minutes   25 minutes  Procedures  None  Consults   Hematology  DVT Prophylaxis  SCD's  Medications  Scheduled Meds: . sodium chloride    Intravenous Once  . aminocaproic acid  4 g Oral 4 times per day  . dexamethasone  40 mg Intravenous Q24H  . eltrombopag  50 mg Oral Daily  . fluticasone  2 spray Each Nare Daily  . folic acid  1 mg Oral Daily  . loratadine  10 mg Oral Daily  . multivitamin with minerals  1 tablet Oral q morning - 10a  . pantoprazole  40 mg Oral BID AC   Continuous Infusions:  PRN Meds:.acetaminophen **OR** acetaminophen   Antibiotics   Anti-infectives    None        Subjective:   Scott Waters was seen and examined today. Gum bleeding has stopped. Tolerating solid diet without any difficulty. No chest pain or abdominal pain. Patient denies dizziness, N/V/D/C, new weakness, numbess, tingling. No acute events overnight.    Objective:   Blood pressure 102/66, pulse 83, temperature 98.5 F (36.9 C), temperature source Oral, resp. rate 18, height 5' 9.6" (1.768 m), weight 87.5 kg (192 lb 14.4 oz), SpO2 100 %.  Wt Readings from Last 3 Encounters:  08/13/15 87.5 kg (192 lb 14.4 oz)  08/02/15 87.091 kg (192 lb)  07/10/15 87.998 kg (194 lb)     Intake/Output Summary (Last 24 hours) at 08/15/15 1314 Last data filed at 08/15/15 1021  Gross per 24 hour  Intake    370 ml  Output    500 ml  Net   -130 ml    Exam  General: Alert and oriented x 3, NAD  HEENT:  PERRLA, EOMI, Anicteric Sclera, mucous membranes moist.  no further bleeding   Neck: Supple, no JVD, no masses  CVS: S1 S2 clear, RRR  Respiratory: CTAB  Abdomen: Soft, nontender, nondistended, + bowel sounds  Ext: no cyanosis clubbing or edema  Neuro: no new deficits   Skin: bruises, petechial rash from the knees down to feet  Psych: Normal affect and demeanor, alert and oriented x3    Data Review   Micro Results No results found for this or any previous visit (from the past 240 hour(s)).  Radiology Reports Nm Liver Spleen Scan  08/01/2015   CLINICAL DATA:  Recurrent thrombocytopenia.  EXAM: NUCLEAR MEDICINE LIVER -  SPLEEN SCAN  TECHNIQUE: Abdominal images were obtained in multiple projections after intravenous injection of radiopharmaceutical.  RADIOPHARMACEUTICALS:  5.03 mCi Tc-19msulfur colloid IV  COMPARISON:  11/14/2014  FINDINGS: Normal physiologic activity is identified within the liver. no additional foci of increased uptake identified to suggest residual splenic tissue.  IMPRESSION: 1. No evidence for residual or recurrent splenic tissue.   Electronically Signed   By: TKerby MoorsM.D.   On: 08/01/2015 15:21    CBC  Recent Labs Lab 08/09/15 1355 08/12/15 2306 08/13/15 1220 08/14/15 0506 08/15/15 0637  WBC 9.6 14.4* 15.0* 19.3* 25.9*  HGB  11.7* 9.0* 9.1* 7.9* 7.2*  HCT 35.1* 27.1* 28.0* 23.6* 21.7*  PLT <6* 5* >5* <5* 5*  MCV 91 90.6 90.9 90.4 90.0  MCH 30.2 30.1 29.5 30.3 29.9  MCHC 33.3 33.2 32.5 33.5 33.2  RDW 14.3 15.0 15.1 15.4 15.3  LYMPHSABS 2.1  --  2.1  --   --   MONOABS  --   --  0.8  --   --   EOSABS 0.1  --  0.2  --   --   BASOSABS 0.1  --  0.0  --   --     Chemistries   Recent Labs Lab 08/12/15 2306 08/13/15 1220  NA 135 139  K 3.6 3.7  CL 104 108  CO2 24 24  GLUCOSE 109* 106*  BUN 21* 15  CREATININE 0.83 0.76  CALCIUM 8.6* 8.6*   ------------------------------------------------------------------------------------------------------------------ estimated creatinine clearance is 107.4 mL/min (by C-G formula based on Cr of 0.76). ------------------------------------------------------------------------------------------------------------------ No results for input(s): HGBA1C in the last 72 hours. ------------------------------------------------------------------------------------------------------------------ No results for input(s): CHOL, HDL, LDLCALC, TRIG, CHOLHDL, LDLDIRECT in the last 72 hours. ------------------------------------------------------------------------------------------------------------------ No results for input(s): TSH, T4TOTAL, T3FREE,  THYROIDAB in the last 72 hours.  Invalid input(s): FREET3 ------------------------------------------------------------------------------------------------------------------ No results for input(s): VITAMINB12, FOLATE, FERRITIN, TIBC, IRON, RETICCTPCT in the last 72 hours.  Coagulation profile  Recent Labs Lab 08/13/15 0301  INR 1.07    No results for input(s): DDIMER in the last 72 hours.  Cardiac Enzymes No results for input(s): CKMB, TROPONINI, MYOGLOBIN in the last 168 hours.  Invalid input(s): CK ------------------------------------------------------------------------------------------------------------------ Invalid input(s): POCBNP  No results for input(s): GLUCAP in the last 72 hours.   RAI,RIPUDEEP M.D. [facility] 08/15/2015, 1:14 PM  Pager: [phone] Between 7am to 7pm - call Pager - [phone]  After 7pm go to [email or web address] - password [email or web address]  Call night coverage person covering after 7pm

## 2015-08-15 NOTE — Progress Notes (Signed)
There is no bleeding. His platelet count is 5000. He received IVIG. He is on steroids right now.  I suspect that we might need to consider another bone marrow test on him. Last we had done was a year ago.  I still cannot totally rule out underlying myelodysplasia. So far, we have not found this.  His hemoglobin is 7.2. This might be a dilutional effect from the IVIG. I would watch this closely.  Again, he's had no mouth bleeding. He is on Amicar mouth rinse.  He's had no fever. His appetite is okay. He's had no change in bowel or bladder habits. There's been no leg swelling.  His vital signs showed temperature of 98.7. Pulse 87. Blood pressure 92/ 51. On his physical exam, his lungs are clear. Oral exam shows no bleeding. Cardiac exam regular rate and rhythm.  Abdomen is soft. He has the laparoscopy scar from his splenectomy. There is no hepatomegaly. Extremities shows no clubbing, cyanosis or edema. Skin exam shows no ecchymoses or petechia. Neurological exam shows no focal neurological deficits.  Again, I think that a bone marrow is going to be necessary. I will see if radiology can do this tomorrow.  He continues on Decadron. Hopefully, his platelet count will be higher tomorrow.  His blood pressures awful low. I does wonder if he needs to be off his antihypertensives. Some of this might be from his anemia. I would hold off on transfusing him right now.  I appreciate all the great care that he is getting for everybody up on [address].  [name]  [place] 4:13    .

## 2015-08-15 NOTE — Progress Notes (Signed)
Patient ID: Scott Waters, male   DOB: 1956-02-26, 59 y.o.   MRN: [id number]    Referring Physician(s): Ennever,P  Chief Complaint:  Anemia, thrombocytopenia  Subjective: Patient familiar to IR service from prior bone marrow biopsy on 04/28/14. Patient has known ITP, initially diagnosed in May 2014, status post multiple treatments as well as splenectomy in December 2015. He has had a temporary response but was recently admitted with bleeding from gums, hypotension, dark stools, leukocytosis,  and lightheadedness. Current laboratory values reveal  WBC of 25.9, hemoglobin 7.2, platelets 5k, PT 14.1, INR 1.07. Request has now been received from oncology for repeat CT-guided bone marrow biopsy for further evaluation.    Allergies: Review of patient's allergies indicates no known allergies.  Medications: Prior to Admission medications   Medication Sig Start Date End Date Taking? Authorizing Provider  amLODipine-olmesartan (AZOR) 10-40 MG per tablet Take 1 tablet by mouth every morning.    Yes Historical Provider, MD  CINNAMON PO Take 1,000 mg by mouth every morning.   Yes Historical Provider, MD  eltrombopag (PROMACTA) 50 MG tablet Take 1 tablet (50 mg total) by mouth daily. Take on an empty stomach 1 hour before a meal or 2 hours after 08/03/15  Yes [name], MD  fexofenadine (ALLEGRA) 180 MG tablet Take 180 mg by mouth every morning.   Yes Historical Provider, MD  fluticasone (FLONASE) 50 MCG/ACT nasal spray instill 2 sprays into each nostril once daily 11/24/14  Yes Burnis Medin, MD  folic acid (FOLVITE) 1 MG tablet Take 1 tablet (1 mg total) by mouth daily. 07/12/15  Yes [name], MD  ibuprofen (ADVIL,MOTRIN) 200 MG tablet Take 400 mg by mouth 2 (two) times daily.   Yes Historical Provider, MD  Multiple Vitamin (MULTIVITAMIN WITH MINERALS) TABS tablet Take 1 tablet by mouth every morning.    Yes Historical Provider, MD  pantoprazole (PROTONIX) 40 MG tablet Take 1 tablet (40 mg  total) by mouth daily. 05/23/14  Yes [name], MD  potassium chloride (K-DUR,KLOR-CON) 10 MEQ tablet take 2 tablets by mouth once daily 05/31/15  Yes Burnis Medin, MD  Probiotic Product (PROBIOTIC DAILY PO) Take 1 tablet by mouth every morning.    Yes Historical Provider, MD  simvastatin (ZOCOR) 40 MG tablet Take 40 mg by mouth every morning.    Yes Historical Provider, MD  VITAMIN K PO Take 100 mcg by mouth every morning. OTC med 07/04/14  Yes Historical Provider, MD  aminocaproic acid (AMICAR) 25 % solution Rinse your mouth out with 1 teaspoon 4 times a day. Patient not taking: Reported on 08/12/2015 08/09/15   [name], MD     Vital Signs: BP 102/66 mmHg  Pulse 83  Temp(Src) 98.5 F (36.9 C) (Oral)  Resp 18  Ht 5' 9.6" (1.768 m)  Wt 192 lb 14.4 oz (87.5 kg)  BMI 27.99 kg/m2  SpO2 100%  Physical Exam patient awake, alert. No further evidence of bleeding. Chest with clear breath sounds bilaterally. Heart with occasional ectopy noted. Abdomen soft, positive bowel sounds, nontender. Extremities with full range of motion and no edema.  Imaging: No results found.  Labs:  CBC:  Recent Labs  08/12/15 2306 08/13/15 1220 08/14/15 0506 08/15/15 0637  WBC 14.4* 15.0* 19.3* 25.9*  HGB 9.0* 9.1* 7.9* 7.2*  HCT 27.1* 28.0* 23.6* 21.7*  PLT 5* >5* <5* 5*    COAGS:  Recent Labs  10/31/14 1440 08/13/15 0301  INR 1.00 1.07  APTT  27  --     BMP:  Recent Labs  11/15/14 0513 11/16/14 0425 05/03/15 0827 08/12/15 2306 08/13/15 1220  NA 141 136 140 135 139  K 4.2 3.8 3.9 3.6 3.7  CL 108 105 105 104 108  CO2 22 24 29 24 24   GLUCOSE 96 78 84 109* 106*  BUN 22 15 15  21* 15  CALCIUM 8.1* 8.1* 9.2 8.6* 8.6*  CREATININE 1.09 0.94 0.96 0.83 0.76  GFRNONAA 72* 89*  --  >60 >60  GFRAA 83* >90  --  >60 >60    LIVER FUNCTION TESTS:  Recent Labs  09/14/14 1358 10/31/14 1440 11/15/14 0513 05/03/15 0827  BILITOT 0.4 0.3 0.5 0.5  AST 41* 38* 22 32  ALT 36 31 17  33  ALKPHOS 84 111 69 113  PROT 6.4 7.1 6.0 6.3  ALBUMIN 4.2 3.8 3.2* 4.0    Assessment and Plan: Patient with history of refractory ITP and recent admission for gum bleeding, hypotension, dark stools and leukocytosis. Prior bone marrow biopsy performed in June 2015. Request now received for repeat CT-guided bone marrow biopsy for further evaluation to rule out underlying myelodysplasia.Risks and benefits discussed with the patient including, but not limited to bleeding, infection, damage to adjacent structures or low yield requiring additional tests. All of the patient's questions were answered, patient is agreeable to proceed. Consent signed and in chart. Procedure tent planned for 9/21.      Signed: D. Rowe Robert 08/15/2015, 10:45 AM   I spent a total of 15 minutes at the the patient's bedside AND on the patient's hospital floor or unit, greater than 50% of which was counseling/coordinating care for CT guided bone marrow biopsy

## 2015-08-16 ENCOUNTER — Inpatient Hospital Stay (HOSPITAL_COMMUNITY): Payer: BLUE CROSS/BLUE SHIELD

## 2015-08-16 ENCOUNTER — Other Ambulatory Visit: Payer: BLUE CROSS/BLUE SHIELD

## 2015-08-16 DIAGNOSIS — D649 Anemia, unspecified: Secondary | ICD-10-CM

## 2015-08-16 DIAGNOSIS — D696 Thrombocytopenia, unspecified: Secondary | ICD-10-CM

## 2015-08-16 DIAGNOSIS — I10 Essential (primary) hypertension: Secondary | ICD-10-CM

## 2015-08-16 LAB — COMPREHENSIVE METABOLIC PANEL
ALBUMIN: 2.5 g/dL — AB (ref 3.5–5.0)
ALT: 28 U/L (ref 17–63)
AST: 30 U/L (ref 15–41)
Alkaline Phosphatase: 51 U/L (ref 38–126)
Anion gap: 4 — ABNORMAL LOW (ref 5–15)
BUN: 28 mg/dL — AB (ref 6–20)
CHLORIDE: 109 mmol/L (ref 101–111)
CO2: 22 mmol/L (ref 22–32)
CREATININE: 1.06 mg/dL (ref 0.61–1.24)
Calcium: 8 mg/dL — ABNORMAL LOW (ref 8.9–10.3)
GFR calc Af Amer: >60 mL/min (ref >60)
GLUCOSE: 107 mg/dL — AB (ref 65–99)
POTASSIUM: 3.6 mmol/L (ref 3.5–5.1)
SODIUM: 135 mmol/L (ref 135–145)
Total Bilirubin: 0.6 mg/dL (ref 0.3–1.2)
Total Protein: 7.3 g/dL (ref 6.5–8.1)

## 2015-08-16 LAB — CBC WITH DIFFERENTIAL/PLATELET
BASOS PCT: 0 %
BASOS PCT: 0 %
Basophils Absolute: 0 10*3/uL (ref 0.0–0.1)
Basophils Absolute: 0 10*3/uL (ref 0.0–0.1)
EOS ABS: 0.2 10*3/uL (ref 0.0–0.7)
EOS PCT: 1 %
EOS PCT: 0 %
Eosinophils Absolute: 0 10*3/uL (ref 0.0–0.7)
HCT: 28 % — ABNORMAL LOW (ref 39.0–52.0)
HEMATOCRIT: 20.3 % — AB (ref 39.0–52.0)
Hemoglobin: 9.1 g/dL — ABNORMAL LOW (ref 13.0–17.0)
Hemoglobin: 6.7 g/dL — CL (ref 13.0–17.0)
LYMPHS ABS: 2.1 10*3/uL (ref 0.7–4.0)
LYMPHS ABS: 2.1 10*3/uL (ref 0.7–4.0)
Lymphocytes Relative: 14 %
Lymphocytes Relative: 8 %
MCH: 29.5 pg (ref 26.0–34.0)
MCH: 30.2 pg (ref 26.0–34.0)
MCHC: 32.5 g/dL (ref 30.0–36.0)
MCHC: 33 g/dL (ref 30.0–36.0)
MCV: 90.9 fL (ref 78.0–100.0)
MCV: 91.4 fL (ref 78.0–100.0)
MONO ABS: 0.8 10*3/uL (ref 0.1–1.0)
MONO ABS: 1.9 10*3/uL — AB (ref 0.1–1.0)
MONOS PCT: 7 %
Monocytes Relative: 5 %
NEUTROS ABS: 11.9 10*3/uL — AB (ref 1.7–7.7)
NEUTROS ABS: 22.5 10*3/uL — AB (ref 1.7–7.7)
NEUTROS PCT: 80 %
Neutrophils Relative %: 85 %
PLATELETS: 9 10*3/uL — AB (ref 150–400)
RBC: 3.08 MIL/uL — ABNORMAL LOW (ref 4.22–5.81)
RBC: 2.22 MIL/uL — AB (ref 4.22–5.81)
RDW: 15.1 % (ref 11.5–15.5)
RDW: 15.4 % (ref 11.5–15.5)
WBC: 15 10*3/uL — ABNORMAL HIGH (ref 4.0–10.5)
WBC: 26.5 10*3/uL — AB (ref 4.0–10.5)

## 2015-08-16 LAB — BONE MARROW EXAM

## 2015-08-16 LAB — PREPARE RBC (CROSSMATCH)

## 2015-08-16 MED ORDER — MIDAZOLAM HCL 2 MG/2ML IJ SOLN
INTRAMUSCULAR | Status: AC
  Filled 2015-08-16: qty 2

## 2015-08-16 MED ORDER — SODIUM CHLORIDE 0.9 % IV SOLN
Freq: Once | INTRAVENOUS | Status: AC
  Administered 2015-08-16: 07:00:00 via INTRAVENOUS

## 2015-08-16 MED ORDER — ELTROMBOPAG OLAMINE 50 MG PO TABS
50.0000 mg | ORAL_TABLET | Freq: Every day | ORAL | Status: DC
  Administered 2015-08-16 – 2015-08-18 (×3): 50 mg via ORAL
  Filled 2015-08-16 (×3): qty 1

## 2015-08-16 MED ORDER — LIDOCAINE-EPINEPHRINE 1 %-1:100000 IJ SOLN
INTRAMUSCULAR | Status: AC
  Filled 2015-08-16: qty 1

## 2015-08-16 MED ORDER — FENTANYL CITRATE (PF) 100 MCG/2ML IJ SOLN
INTRAMUSCULAR | Status: AC | PRN
  Administered 2015-08-16 (×2): 50 ug via INTRAVENOUS

## 2015-08-16 MED ORDER — FUROSEMIDE 10 MG/ML IJ SOLN
20.0000 mg | Freq: Once | INTRAMUSCULAR | Status: AC
  Administered 2015-08-16: 20 mg via INTRAVENOUS
  Filled 2015-08-16: qty 2

## 2015-08-16 MED ORDER — MIDAZOLAM HCL 2 MG/2ML IJ SOLN
INTRAMUSCULAR | Status: AC | PRN
  Administered 2015-08-16 (×2): 1 mg via INTRAVENOUS

## 2015-08-16 MED ORDER — FENTANYL CITRATE (PF) 100 MCG/2ML IJ SOLN
INTRAMUSCULAR | Status: AC
  Filled 2015-08-16: qty 2

## 2015-08-16 MED ORDER — LIDOCAINE HCL 1 % IJ SOLN
INTRAMUSCULAR | Status: AC
  Filled 2015-08-16: qty 20

## 2015-08-16 NOTE — Sedation Documentation (Signed)
Patient is resting comfortably. 

## 2015-08-16 NOTE — Progress Notes (Signed)
[facility] PROGRESS NOTE  Scott Waters UEA:[id number] DOB: Apr 26, 1956 DOA: 08/12/2015 PCP: [name], MD   Brief narrative 59 year old male with history of refractory ITP (followed by Dr. [name] for chronic ITP, s/p splenectomy), hypertension, hyperlipidemia presented with ongoing bleeding.  He has had platelets less than or equal to 6000 and has been receiving Nplate (romiplostim) q4 weeks, first dose on 07/05/2015, then on 07/26/2015. He did not respond so he was given a prescription for Promacta (Eltrombopag) on 9/7, however, he could not afford $220 copay. He was given information about assistance programs. About four days prior to admission, he developed gum bleeding from around several teeth, but most prominently the lower four incisors. The bleeding did not stop and he was spitting out the blood in the containers. He tried brushing his teeth vigorously, picking out the blood clots, swishing with alcohol-based mouthwash, swishing with hydrogen peroxide solution, and swishing with saline, none of which have helped and most of which has made his bleeding worse. He also reported dark stools ever since his gums started bleeding and he has no history of ulcers. He takes ibuprofen and drinks alcohol daily, 2-4 drinks. His last drink was 2 days prior to admission. He came to the emergency department because his bleeding was not stopping. While in the emergency department, he had an episode of lightheadedness without syncope or head trauma. At the time, he denied focal numbness, tingling, weakness, slurred speech, or facial droop. He denies epistaxis. In the emergency department, his vital signs were notable for mild hypertension of 93/40, pulse variable from 50s to low 100s. His white blood cell count was 14.4, hemoglobin 9 with a baseline of 12, platelets 5. Patient was admitted for further workup.    Assessment/Plan: Refractory ITP Platelet count still <10. Started on IVIG  on 9/18. Appreciate hematology follow-up and recommendations.. Patient underwent bone marrow biopsy today. Will follow-up with results.  Symptomatic anemia Secondary to  underlying hemolysis. She also on IVIG. Hemoglobin of 6.7 today and order 2 units PRBC. Monitor H&H in a.m.  Essential hypertension Calling blood pressure medications given low blood pressure.  Leukocytosis Secondary to Decadron. No signs of infection.  Hyperlipidemia Zocor held.   DVT prophylaxis: SCDs  Diet: Regular  Code Status: Full code Family Communication: None at bedside Disposition Plan: Currently inpatient   Consultants:  Hematology (Dr. [name]))  Procedures:  Bone marrow bx on 9/21  Antibiotics:  none  HPI/Subjective: Patient seen and examined. Denies any symptoms. Ordered 2 units PRBC.  Objective: Filed Vitals:   08/16/15 1427  BP: 114/57  Pulse: 75  Temp: 98.4 F (36.9 C)  Resp: 18    Intake/Output Summary (Last 24 hours) at 08/16/15 1532 Last data filed at 08/16/15 1210  Gross per 24 hour  Intake    328 ml  Output    750 ml  Net   -422 ml   Filed Weights   08/13/15 0418  Weight: 87.5 kg (192 lb 14.4 oz)    Exam:   General: It'll aged male in no acute distress  HEENT: Pallor present, moist oral mucosa, supple neck  Chest: Clear to auscultation bilaterally, no added sounds  CVS: Normal S1 and S2, no murmurs rub or gallop  GI: Soft, nondistended, nontender, bowel sounds present  Musculoskeletal: Warm, no edema  CNS: Alert and oriented    Data Reviewed: Basic Metabolic Panel:  Recent Labs Lab 08/12/15 2306 08/13/15 1220 08/16/15 0537  NA 135 139 135  K 3.6 3.7 3.6  CL 104 108 109  CO2 24 24 22   GLUCOSE 109* 106* 107*  BUN 21* 15 28*  CREATININE 0.83 0.76 1.06  CALCIUM 8.6* 8.6* 8.0*   Liver Function Tests:  Recent Labs Lab 08/16/15 0537  AST 30  ALT 28  ALKPHOS 51  BILITOT 0.6  PROT 7.3  ALBUMIN 2.5*   No results for input(s):  LIPASE, AMYLASE in the last 168 hours. No results for input(s): AMMONIA in the last 168 hours. CBC:  Recent Labs Lab 08/12/15 2306 08/13/15 1220 08/14/15 0506 08/15/15 0637 08/16/15 0537  WBC 14.4* 15.0* 19.3* 25.9* 26.5*  NEUTROABS  --  11.9*  --   --  22.5*  HGB 9.0* 9.1* 7.9* 7.2* 6.7*  HCT 27.1* 28.0* 23.6* 21.7* 20.3*  MCV 90.6 90.9 90.4 90.0 91.4  PLT 5* <5* <5* 5* 9*   Cardiac Enzymes: No results for input(s): CKTOTAL, CKMB, CKMBINDEX, TROPONINI in the last 168 hours. BNP (last 3 results) No results for input(s): BNP in the last 8760 hours.  ProBNP (last 3 results) No results for input(s): PROBNP in the last 8760 hours.  CBG: No results for input(s): GLUCAP in the last 168 hours.  No results found for this or any previous visit (from the past 240 hour(s)).   Studies: Ct Biopsy  08/16/2015   INDICATION: Anemia. Thrombocytopenia. Please perform CT-guided bone marrow biopsy and aspiration for tissue diagnostic purposes.  EXAM: CT-GUIDED BONE MARROW BIOPSY AND ASPIRATION  MEDICATIONS: Fentanyl 100 mcg IV; Versed 2 mg IV  ANESTHESIA/SEDATION: Sedation Time  10 minutes  CONTRAST:  None  COMPLICATIONS: None immediate.  PROCEDURE: Informed consent was obtained from the patient following an explanation of the procedure, risks, benefits and alternatives. The patient understands, agrees and consents for the procedure. All questions were addressed. A time out was performed prior to the initiation of the procedure. The patient was positioned prone and non-contrast localization CT was performed of the pelvis to demonstrate the iliac marrow spaces. The operative site was prepped and draped in the usual sterile fashion.  Under sterile conditions and local anesthesia, a 22 gauge spinal needle was utilized for procedural planning. Next, an 11 gauge coaxial bone biopsy needle was advanced into the left iliac marrow space. Needle position was confirmed with CT imaging. Initially, bone marrow  aspiration was performed. Next, a bone marrow biopsy was obtained with the 11 gauge outer bone marrow device. The 11 gauge coaxial bone biopsy needle was re-advanced into a slightly different location within the left iliac marrow space, positioning was confirmed and an additional bone marrow biopsy was obtained. Samples were prepared with the cytotechnologist and deemed adequate. The needle was removed intact. Hemostasis was obtained with compression and a dressing was placed. The patient tolerated the procedure well without immediate post procedural complication.  IMPRESSION: Successful CT guided left iliac bone marrow aspiration and core biopsies.   Electronically Signed   By: Sandi Mariscal M.D.   On: 08/16/2015 13:35    Scheduled Meds: . sodium chloride   Intravenous Once  . aminocaproic acid  4 g Oral 4 times per day  . eltrombopag  50 mg Oral Daily  . fentaNYL      . fluticasone  2 spray Each Nare Daily  . folic acid  1 mg Oral Daily  . lidocaine-EPINEPHrine      . loratadine  10 mg Oral Daily  . midazolam      . multivitamin with minerals  1 tablet Oral q morning - 10a  .  pantoprazole  40 mg Oral BID AC   Continuous Infusions:     Time spent:35 MINUTES    Louellen Molder  [facility] Pager [phone]. If 7PM-7AM, please contact night-coverage at [email or web address], password [email or web address] 08/16/2015, 3:32 PM  LOS: 3 days

## 2015-08-16 NOTE — Clinical Documentation Improvement (Signed)
Internal Medicine Oncology  Can the diagnosis of anemia be further specified (acuity and type)?  Acute blood loss anemia  Acute blood loss anemia on chronic anemia (please specify)  Iron deficiency Anemia  Nutritional anemia, including the nutrition or mineral deficits  Chronic Anemia, including the suspected or known cause  Anemia of chronic disease, including the associated chronic disease state  Other  Clinically Undetermined  Document any associated diagnoses/conditions.  Supporting Information: "Symptomatic anemia" currently documented in chart.   Component      RBC Hemoglobin HCT  Latest Ref Rng      4.22 - 5.81 MIL/uL 13.0 - 17.0 g/dL 39.0 - 52.0 %  08/12/2015      2.99 (L) 9.0 (L) 27.1 (L)  08/13/2015     12:20 PM 3.08 (L) 9.1 (L) 28.0 (L)  08/14/2015      2.61 (L) 7.9 (L) 23.6 (L)  08/15/2015      2.41 (L) 7.2 (L) 21.7 (L)  08/16/2015      2.22 (L) 6.7 (LL) 20.3 (L)    Please document in your future progress note and discharge summary.  Please exercise your independent, professional judgment when responding. A specific answer is not anticipated or expected.   Thank you, Mateo Flow, RN [phone] Clinical Documentation Specialist

## 2015-08-16 NOTE — Consult Note (Signed)
Sonja M. Wilson, EdD 

## 2015-08-16 NOTE — Progress Notes (Signed)
hhe is going for a bone marrow test today. His hemoglobin is dropping. This might be from the thrombocytopenia. His platelet count has not yet back today. His hemoglobin is 6.7. I will give him 2 units of blood.  He is on Decadron. He did receive 2 doses of IVIG.  I think he is on Promacta. I did not realize of the hospital I carried this.  He is off his antihypertensives. He is off cholesterol medicine .  He's had no obvious bleeding. His appetite is pretty good.  He's had no fever. He's had no cough. He's had no issues going to the bathroom.  His physical exam is pretty much unrevealing. His temperature 98.6.his blood pressure is 107/64. Pulse is 77. His lungs are clear. Oral exam shows no bleeding. Cardiac exam regular rate and rhythm. Abdomen is soft. Has laparoscopy scars are well-healed. There is no hepatomegaly. Extremities shows no clubbing, cyanosis or edema.  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 I am rechecking a bone marrow test on him so we can make sure there is no underlying myelodysplasia that might also be contributing to the  Low platelets.  Again I think some of the  Anemia is from the IVIG and could be dilutional or maybe a little bit of hemolysis.  He still is not ready for discharge. I think the blood transfusion will help him.  I appreciate everybody's help up on 5 W!!!!  Scott E.  2 Cor 6:2

## 2015-08-16 NOTE — Sedation Documentation (Signed)
Patient grimacing 

## 2015-08-16 NOTE — Procedures (Signed)
Technically successful CT guided bone marrow aspiration and biopsy of left iliac crest. No immediate complications.    SignedSandi Mariscal Pager: [phone] 08/16/2015, 1:18 PM

## 2015-08-17 ENCOUNTER — Other Ambulatory Visit: Payer: Self-pay | Admitting: Hematology & Oncology

## 2015-08-17 DIAGNOSIS — D693 Immune thrombocytopenic purpura: Secondary | ICD-10-CM

## 2015-08-17 LAB — TYPE AND SCREEN
ABO/RH(D): A POS
ANTIBODY SCREEN: NEGATIVE
UNIT DIVISION: 0
Unit division: 0
Unit division: 0

## 2015-08-17 LAB — CBC
HEMATOCRIT: 31.5 % — AB (ref 39.0–52.0)
Hemoglobin: 10.4 g/dL — ABNORMAL LOW (ref 13.0–17.0)
MCH: 29.1 pg (ref 26.0–34.0)
MCHC: 33 g/dL (ref 30.0–36.0)
MCV: 88.2 fL (ref 78.0–100.0)
PLATELETS: 53 10*3/uL — AB (ref 150–400)
RBC: 3.57 MIL/uL — ABNORMAL LOW (ref 4.22–5.81)
RDW: 16.6 % — AB (ref 11.5–15.5)
WBC: 22 10*3/uL — AB (ref 4.0–10.5)

## 2015-08-17 NOTE — Progress Notes (Signed)
Scott Waters had his bone marrow bx yesterday.  He had 2 units blood.  He does feel a little better today. Lab work is not back yet. He is not bleeding.  His son to pick up the Promacta. He does have this at home. I think he only has a $20 for this.  Again, I think the real key will be his blood counts.  If his platelet count is higher, he might be oh to go home. If he does go home, he will clearly need to be on the Amicar mouth rinse.  He's had a pretty decent appetite.  He's had no leg swelling.  He's not noted any bruising.   On his physical exam, all his vital signs are stable. Blood pressure 115/64. He is off blood pressure medication. Temperature is 98.4. His pulse is 69. His lungs are clear. Oral exam shows no bleeding with the mucosa. Cardiac exam regular rate and rhythm with no murmurs, rubs or bruits. Abdomen is soft. Has good bowel sounds. The laparoscopy scars are well-healed. Extremities shows some trace edema in his lower legs.  Again, he has refractory immune thrombocytopenia. The bone marrow result probably will not be back until tomorrow. I don't know if there is any underlying myelodysplasia that might be attributing to some of the thrombocytopenia.  We will see what his platelet count is. Again, if it is higher, he might feel to go home. He has the Promacta at home.  He has received outstanding care on the floor up on 5 W. I appreciate everybody's kind attention to his issues.   [name]  2 timothy 1:7

## 2015-08-17 NOTE — Progress Notes (Signed)
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  He has had platelets less than or equal to 6000 and has been receiving Nplate (romiplostim) q4 weeks, first dose on 07/05/2015, then on 07/26/2015. He did not respond so he was given a prescription for Promacta (Eltrombopag) on 9/7, however, he could not afford $220 copay. He was given information about assistance programs. About four days prior to admission, he developed gum bleeding from around several teeth, but most prominently the lower four incisors. The bleeding did not stop and he was spitting out the blood in the containers. He tried brushing his teeth vigorously, picking out the blood clots, swishing with alcohol-based mouthwash, swishing with hydrogen peroxide solution, and swishing with saline, none of which have helped and most of which has made his bleeding worse. He also reported dark stools ever since his gums started bleeding and he has no history of ulcers. He takes ibuprofen and drinks alcohol daily, 2-4 drinks. His last drink was 2 days prior to admission. He came to the emergency department because his bleeding was not stopping. While in the emergency department, he had an episode of lightheadedness without syncope or head trauma. At the time, he denied focal numbness, tingling, weakness, slurred speech, or facial droop. He denies epistaxis. In the emergency department, his vital signs were notable for mild hypertension of 93/40, pulse variable from 50s to low 100s. His white blood cell count was 14.4, hemoglobin 9 with a baseline of 12, platelets 5. Patient was admitted for further workup.    Assessment/Plan: Refractory ITP  Started on IVIG on 9/18. Appreciate  hematology follow-up and recommendations.. Patient underwent bone marrow biopsy on 9/21. Results pending. Platelets have improved to 53 this morning. No further symptoms.  Symptomatic anemia Secondary to  underlying hemolysis. She also on IVIG. Hemoglobin improved to 10.4 after 2 units PRBC received yesterday.   Essential hypertension Holding  blood pressure medications given low blood pressure.  Leukocytosis Secondary to Decadron received earlier during hospitalization.. No signs of infection. Improving  Hyperlipidemia Zocor held.   DVT prophylaxis: SCDs, allow ambulation  Diet: Regular  Code Status: Full code Family Communication: None at bedside Disposition Plan: Currently inpatient   Consultants:  Hematology (Dr. [name]))  Procedures:  Bone marrow bx on 9/21  Antibiotics:  none  HPI/Subjective: Patient seen and examined. Denies any symptoms. No gum bleeding , epistaxis or melena.   Objective: Filed Vitals:   08/17/15 0615  BP: 115/64  Pulse: 69  Temp: 98.4 F (36.9 C)  Resp: 18    Intake/Output Summary (Last 24 hours) at 08/17/15 1401 Last data filed at 08/17/15 1056  Gross per 24 hour  Intake    590 ml  Output      0 ml  Net    590 ml   Filed Weights   08/13/15 0418  Weight: 87.5 kg (192 lb 14.4 oz)    Exam:   General: no acute distress  HEENT: Pallor present, moist oral mucosa, supple neck  Chest: Clear to auscultation bilaterally, no added sounds  CVS: Normal S1 and S2, no murmurs rub or gallop  GI: Soft, nondistended, nontender, bowel sounds present  Musculoskeletal: Warm, no edema      Data Reviewed: Basic Metabolic Panel:  Recent Labs Lab 08/12/15 2306 08/13/15 1220 08/16/15  0537  NA 135 139 135  K 3.6 3.7 3.6  CL 104 108 109  CO2 24 24 22   GLUCOSE 109* 106* 107*  BUN 21* 15 28*  CREATININE 0.83 0.76 1.06  CALCIUM 8.6* 8.6* 8.0*   Liver Function Tests:  Recent Labs Lab 08/16/15 0537  AST 30  ALT 28   ALKPHOS 51  BILITOT 0.6  PROT 7.3  ALBUMIN 2.5*   No results for input(s): LIPASE, AMYLASE in the last 168 hours. No results for input(s): AMMONIA in the last 168 hours. CBC:  Recent Labs Lab 08/13/15 1220 08/14/15 0506 08/15/15 0637 08/16/15 0537 08/17/15 1221  WBC 15.0* 19.3* 25.9* 26.5* 22.0*  NEUTROABS 11.9*  --   --  22.5*  --   HGB 9.1* 7.9* 7.2* 6.7* 10.4*  HCT 28.0* 23.6* 21.7* 20.3* 31.5*  MCV 90.9 90.4 90.0 91.4 88.2  PLT <5* <5* 5* 9* 53*   Cardiac Enzymes: No results for input(s): CKTOTAL, CKMB, CKMBINDEX, TROPONINI in the last 168 hours. BNP (last 3 results) No results for input(s): BNP in the last 8760 hours.  ProBNP (last 3 results) No results for input(s): PROBNP in the last 8760 hours.  CBG: No results for input(s): GLUCAP in the last 168 hours.  No results found for this or any previous visit (from the past 240 hour(s)).   Studies: Ct Biopsy  08/16/2015   INDICATION: Anemia. Thrombocytopenia. Please perform CT-guided bone marrow biopsy and aspiration for tissue diagnostic purposes.  EXAM: CT-GUIDED BONE MARROW BIOPSY AND ASPIRATION  MEDICATIONS: Fentanyl 100 mcg IV; Versed 2 mg IV  ANESTHESIA/SEDATION: Sedation Time  10 minutes  CONTRAST:  None  COMPLICATIONS: None immediate.  PROCEDURE: Informed consent was obtained from the patient following an explanation of the procedure, risks, benefits and alternatives. The patient understands, agrees and consents for the procedure. All questions were addressed. A time out was performed prior to the initiation of the procedure. The patient was positioned prone and non-contrast localization CT was performed of the pelvis to demonstrate the iliac marrow spaces. The operative site was prepped and draped in the usual sterile fashion.  Under sterile conditions and local anesthesia, a 22 gauge spinal needle was utilized for procedural planning. Next, an 11 gauge coaxial bone biopsy needle was advanced into the left iliac  marrow space. Needle position was confirmed with CT imaging. Initially, bone marrow aspiration was performed. Next, a bone marrow biopsy was obtained with the 11 gauge outer bone marrow device. The 11 gauge coaxial bone biopsy needle was re-advanced into a slightly different location within the left iliac marrow space, positioning was confirmed and an additional bone marrow biopsy was obtained. Samples were prepared with the cytotechnologist and deemed adequate. The needle was removed intact. Hemostasis was obtained with compression and a dressing was placed. The patient tolerated the procedure well without immediate post procedural complication.  IMPRESSION: Successful CT guided left iliac bone marrow aspiration and core biopsies.   Electronically Signed   By: Sandi Mariscal M.D.   On: 08/16/2015 13:35    Scheduled Meds: . sodium chloride   Intravenous Once  . aminocaproic acid  4 g Oral 4 times per day  . eltrombopag  50 mg Oral Daily  . fluticasone  2 spray Each Nare Daily  . folic acid  1 mg Oral Daily  . loratadine  10 mg Oral Daily  . multivitamin with minerals  1 tablet Oral q morning - 10a  . pantoprazole  40 mg Oral BID AC  Continuous Infusions:     Time spent:25 MINUTES    Louellen Molder  [facility] Pager [phone]. If 7PM-7AM, please contact night-coverage at [email or web address], password [facility] 08/17/2015, 2:01 PM  LOS: 4 days

## 2015-08-17 NOTE — Plan of Care (Signed)
Problem: Phase I Progression Outcomes Goal: Initial discharge plan identified Outcome: Progressing Plan for dc home first of next week.

## 2015-08-18 DIAGNOSIS — I472 Ventricular tachycardia: Secondary | ICD-10-CM

## 2015-08-18 DIAGNOSIS — I9589 Other hypotension: Secondary | ICD-10-CM

## 2015-08-18 DIAGNOSIS — D72829 Elevated white blood cell count, unspecified: Secondary | ICD-10-CM

## 2015-08-18 DIAGNOSIS — D599 Acquired hemolytic anemia, unspecified: Secondary | ICD-10-CM

## 2015-08-18 DIAGNOSIS — E876 Hypokalemia: Secondary | ICD-10-CM

## 2015-08-18 LAB — BASIC METABOLIC PANEL
Anion gap: 5 (ref 5–15)
BUN: 28 mg/dL — AB (ref 6–20)
CHLORIDE: 110 mmol/L (ref 101–111)
CO2: 22 mmol/L (ref 22–32)
CREATININE: 1.23 mg/dL (ref 0.61–1.24)
Calcium: 8 mg/dL — ABNORMAL LOW (ref 8.9–10.3)
GFR calc Af Amer: >60 mL/min (ref >60)
GFR calc non Af Amer: >60 mL/min (ref >60)
GLUCOSE: 130 mg/dL — AB (ref 65–99)
Potassium: 3.2 mmol/L — ABNORMAL LOW (ref 3.5–5.1)
Sodium: 137 mmol/L (ref 135–145)

## 2015-08-18 LAB — CBC
HEMATOCRIT: 30.3 % — AB (ref 39.0–52.0)
HEMOGLOBIN: 10.2 g/dL — AB (ref 13.0–17.0)
MCH: 29.9 pg (ref 26.0–34.0)
MCHC: 33.7 g/dL (ref 30.0–36.0)
MCV: 88.9 fL (ref 78.0–100.0)
Platelets: 71 10*3/uL — ABNORMAL LOW (ref 150–400)
RBC: 3.41 MIL/uL — ABNORMAL LOW (ref 4.22–5.81)
RDW: 16.6 % — ABNORMAL HIGH (ref 11.5–15.5)
WBC: 16 10*3/uL — ABNORMAL HIGH (ref 4.0–10.5)

## 2015-08-18 LAB — MAGNESIUM: Magnesium: 2.2 mg/dL (ref 1.7–2.4)

## 2015-08-18 MED ORDER — POTASSIUM CHLORIDE CRYS ER 20 MEQ PO TBCR: 40.0000 meq | EXTENDED_RELEASE_TABLET | Freq: Once | ORAL | Status: DC

## 2015-08-18 NOTE — Care Management Note (Signed)
Case Management Note  Patient Details  Name: Scott Waters MRN: [id number] Date of Birth: 04/11/1956  Subjective/Objective:                  Date: 08-14-15 Monday Spoke with patient at the bedside. Introduced self as Tourist information centre manager and explained role in discharge planning and how to be reached. Verified patient lives in [place] in a 1 story home, with son Bradlee [phone], has DME none. Expressed potential need for no other DME. Verified patient anticipates to go home with son at time of discharge and will have part-time supervision by son at this time to best of their knowledge. Patient confirmed needing help with Amicort for bleeding gums, but is having Promacta mailed to the house today. Patient drives to MD appointments. Verified patient has PCP [facility].   Plan: CM will continue to follow for discharge planning and [facility] resources.   Carles Collet RN BSN CM [phone]  Action/Plan:  Per Dr Clementeen Graham, patient will not be ordered Amicort after discharge. No further CM needs identified. DC to home in self care today Expected Discharge Date:                  Expected Discharge Plan:  Home/Self Care  In-House Referral:     Discharge planning Services  CM Consult  Post Acute Care Choice:    Choice offered to:     DME Arranged:    DME Agency:     HH Arranged:    [place] Agency:     Status of Service:  Completed, signed off  Medicare Important Message Given:  Yes-second notification given Date Medicare IM Given:    Medicare IM give by:    Date Additional Medicare IM Given:    Additional Medicare Important Message give by:     If discussed at [place] of Stay Meetings, dates discussed:    Additional Comments:  Carles Collet, RN 08/18/2015, 2:24 PM

## 2015-08-18 NOTE — Discharge Summary (Signed)
Physician Discharge Summary  Scott Waters JOI:[id number] DOB: 1956/03/10 DOA: 08/12/2015  PCP: Scott Dawson, MD  Admit date: 08/12/2015 Discharge date: 08/18/2015  Time spent: 35 minutes  Recommendations for Outpatient Follow-up:  1. Discharge home with outpatient follow-up with Dr. [name] on 9/27.  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  He has had platelets less than or equal to 6000 and has been receiving Nplate (romiplostim) q4 weeks, first dose on 07/05/2015, then on 07/26/2015. He did not respond so he was given a prescription for Promacta (Eltrombopag) on 9/7, however, he could not afford $220 copay. He was given information about assistance programs. About four days prior to admission, he developed gum bleeding from around several teeth, but most prominently the lower four incisors. The bleeding did not stop and he was spitting out the blood in the containers. He tried brushing his teeth vigorously, picking out the blood clots, swishing with alcohol-based mouthwash, swishing with hydrogen peroxide solution, and swishing with saline, none of which have helped and most of which has made his bleeding worse. He also reported dark stools ever since his gums started bleeding and he has no history of ulcers. He takes ibuprofen and drinks alcohol daily, 2-4 drinks. His last drink was 2  days prior to admission. He came to the emergency department because his bleeding was not stopping. While in the emergency department, he had an episode of lightheadedness without syncope or head trauma. At the time, he denied focal numbness, tingling, weakness, slurred speech, or facial droop. He denies epistaxis. In the emergency department, his vital signs were notable for mild hypertension of 93/40, pulse variable from 50s to low 100s. His white blood cell count was 14.4, hemoglobin 9 with a baseline of 12, platelets 5. Patient was admitted for further workup.    Hospital Course:  Refractory ITP Started on IVIG on 9/18. Appreciate hematology follow-up and recommendations.. Patient underwent bone marrow biopsy on 9/21. Results suggestive of immune thrombocytopenia. No marrow infiltration noted. Platelets improved to 71 this morning. Patient will continue promacta at home. -He will follow-up with Dr. [name] on 9/27. -Patient instructed to return to ED if he has further gum bleeds, epistaxis, chest discomfort, shortness of breath, dizziness headache or blurred vision.  Symptomatic anemia Secondary to underlying hemolysis. She was also on on IVIG. Hemoglobin improved to 10.4 after 2 units PRBC received on 9/21  Essential hypertension Blood pressure stable. Medications were held due to low blood pressure was in the hospital. Can be resumed upon discharge.  Leukocytosis Secondary to Decadron received earlier during hospitalization.. No signs of infection. Improving  Hyperlipidemia Zocor is continued by hematology given some probability of causing thrombocytopenia.  NSVT with PVCs Asymptomatic. Low potassium noted and has been supplemented. Magnesium normal. Resume home potassium supplements upon discharge.   clinically stable to be discharged home with outpatient follow-up.  Code Status: Full code Family Communication: None at bedside Disposition Plan: Home with  outpatient  hematology follow-up   Consultants:  Hematology (Dr. [name]))  Procedures:  Bone marrow bx on 9/21  Antibiotics:  none    Discharge Exam: Filed Vitals:   08/18/15 0546  BP: 112/59  Pulse: 62  Temp: 98.2 F (36.8 C)  Resp: 20    General: Middle aged male in no acute distress HEENT: Pallor present, moist oral mucosa, no gum bleeding, supple neck Chest: Clear to auscultation bilaterally CVS: Normal S1 and S2, no murmurs rub gallop GI: Soft, nondistended, nontender, bowel sounds present Musculoskeletal: Warm, no edema CNS: Alert and oriented   Discharge Instructions    Current Discharge Medication List    CONTINUE these medications which have NOT CHANGED   Details  amLODipine-olmesartan (AZOR) 10-40 MG per tablet Take 1 tablet by mouth every morning.     CINNAMON PO Take 1,000 mg by mouth every morning.    eltrombopag (PROMACTA) 50 MG tablet Take 1 tablet (50 mg total) by mouth daily. Take on an empty stomach 1 hour before a meal or 2 hours after Qty: 30 tablet, Refills: 2   Associated Diagnoses: ITP (idiopathic thrombocytopenic purpura)    fexofenadine (ALLEGRA) 180 MG tablet Take 180 mg by mouth every morning.    fluticasone (FLONASE) 50 MCG/ACT nasal spray instill 2 sprays into each nostril once daily Qty: 16 g, Refills: 5    folic acid (FOLVITE) 1 MG tablet Take 1 tablet (1 mg total) by mouth daily. Qty: 30 tablet, Refills: 6   Associated Diagnoses: Thrombocytopenia    Multiple Vitamin (MULTIVITAMIN WITH MINERALS) TABS tablet Take 1 tablet by mouth every morning.     pantoprazole (PROTONIX) 40 MG tablet Take 1 tablet (40 mg total) by mouth daily. Qty: 30 tablet, Refills: 6   Associated Diagnoses: Leukocytopenia, unspecified    potassium chloride (K-DUR,KLOR-CON) 10 MEQ tablet take 2 tablets by mouth once daily Qty: 60 tablet, Refills: 10    Probiotic Product (PROBIOTIC DAILY PO) Take 1 tablet by mouth every morning.     VITAMIN K PO Take 100  mcg by mouth every morning. OTC med      STOP taking these medications     ibuprofen (ADVIL,MOTRIN) 200 MG tablet      simvastatin (ZOCOR) 40 MG tablet      aminocaproic acid (AMICAR) 25 % solution        No Known Allergies Follow-up Information    Follow up with Scott Dawson, MD In 2 weeks.   Specialties:  Internal Medicine, Pediatrics   Contact information:   [address] [address] [phone]       Follow up with [name], MD On 08/22/2015.   Specialty:  Oncology   Contact information:   [address] [place] [address] [phone]        The results of significant diagnostics from this hospitalization (including imaging, microbiology, ancillary and laboratory) are listed below for reference.    Significant Diagnostic Studies: Nm Liver Spleen Scan  08/01/2015   CLINICAL DATA:  Recurrent thrombocytopenia.  EXAM: NUCLEAR MEDICINE LIVER - SPLEEN SCAN  TECHNIQUE: Abdominal images were obtained in multiple projections after intravenous injection of radiopharmaceutical.  RADIOPHARMACEUTICALS:  5.03 mCi Tc-49msulfur colloid IV  COMPARISON:  11/14/2014  FINDINGS: Normal physiologic activity is identified within the liver. no additional foci of increased uptake identified to suggest residual splenic tissue.  IMPRESSION: 1. No evidence for residual or recurrent splenic tissue.   Electronically Signed   By: TLovena Le  Clovis Riley M.D.   On: 08/01/2015 15:21   Ct Biopsy  08/16/2015   INDICATION: Anemia. Thrombocytopenia. Please perform CT-guided bone marrow biopsy and aspiration for tissue diagnostic purposes.  EXAM: CT-GUIDED BONE MARROW BIOPSY AND ASPIRATION  MEDICATIONS: Fentanyl 100 mcg IV; Versed 2 mg IV  ANESTHESIA/SEDATION: Sedation Time  10 minutes  CONTRAST:  None  COMPLICATIONS: None immediate.  PROCEDURE: Informed consent was obtained from the patient following an explanation of the procedure, risks, benefits and alternatives.  The patient understands, agrees and consents for the procedure. All questions were addressed. A time out was performed prior to the initiation of the procedure. The patient was positioned prone and non-contrast localization CT was performed of the pelvis to demonstrate the iliac marrow spaces. The operative site was prepped and draped in the usual sterile fashion.  Under sterile conditions and local anesthesia, a 22 gauge spinal needle was utilized for procedural planning. Next, an 11 gauge coaxial bone biopsy needle was advanced into the left iliac marrow space. Needle position was confirmed with CT imaging. Initially, bone marrow aspiration was performed. Next, a bone marrow biopsy was obtained with the 11 gauge outer bone marrow device. The 11 gauge coaxial bone biopsy needle was re-advanced into a slightly different location within the left iliac marrow space, positioning was confirmed and an additional bone marrow biopsy was obtained. Samples were prepared with the cytotechnologist and deemed adequate. The needle was removed intact. Hemostasis was obtained with compression and a dressing was placed. The patient tolerated the procedure well without immediate post procedural complication.  IMPRESSION: Successful CT guided left iliac bone marrow aspiration and core biopsies.   Electronically Signed   By: Sandi Mariscal M.D.   On: 08/16/2015 13:35    Microbiology: No results found for this or any previous visit (from the past 240 hour(s)).   Labs: Basic Metabolic Panel:  Recent Labs Lab 08/12/15 2306 08/13/15 1220 08/16/15 0537  NA 135 139 135  K 3.6 3.7 3.6  CL 104 108 109  CO2 24 24 22   GLUCOSE 109* 106* 107*  BUN 21* 15 28*  CREATININE 0.83 0.76 1.06  CALCIUM 8.6* 8.6* 8.0*   Liver Function Tests:  Recent Labs Lab 08/16/15 0537  AST 30  ALT 28  ALKPHOS 51  BILITOT 0.6  PROT 7.3  ALBUMIN 2.5*   No results for input(s): LIPASE, AMYLASE in the last 168 hours. No results for  input(s): AMMONIA in the last 168 hours. CBC:  Recent Labs Lab 08/13/15 1220 08/14/15 0506 08/15/15 0637 08/16/15 0537 08/17/15 1221 08/18/15 0619  WBC 15.0* 19.3* 25.9* 26.5* 22.0* 16.0*  NEUTROABS 11.9*  --   --  22.5*  --   --   HGB 9.1* 7.9* 7.2* 6.7* 10.4* 10.2*  HCT 28.0* 23.6* 21.7* 20.3* 31.5* 30.3*  MCV 90.9 90.4 90.0 91.4 88.2 88.9  PLT <5* <5* 5* 9* 53* 71*   Cardiac Enzymes: No results for input(s): CKTOTAL, CKMB, CKMBINDEX, TROPONINI in the last 168 hours. BNP: BNP (last 3 results) No results for input(s): BNP in the last 8760 hours.  ProBNP (last 3 results) No results for input(s): PROBNP in the last 8760 hours.  CBG: No results for input(s): GLUCAP in the last 168 hours.     SignedLouellen Molder  [facility] 08/18/2015, 11:14 AM

## 2015-08-18 NOTE — Care Management Important Message (Signed)
Important Message  Patient Details  Name: Scott Waters MRN: [id number] Date of Birth: Oct 19, 1956   Medicare Important Message Given:  Yes-second notification given    Loann Quill 08/18/2015, 11:15 AM

## 2015-08-18 NOTE — Discharge Instructions (Signed)
Thrombocytopenia Thrombocytopenia means there are not enough platelets in your blood. Platelets are tiny cells in your blood. When you start bleeding, platelets clump together around the cut or injury to stop the bleeding. This process is called blood clotting. Not having enough platelets can cause bleeding problems. HOME CARE  Check your skin and inside your mouth for bruises or blood as told by your doctor.  Check your spit (sputum), pee (urine), and poop (stool) for blood as told by your doctor.  Do not do activities that can cause bumps or bruises until your doctor says it is okay.  Be careful not to cut yourself when you shave or use scissors, needles, knives, or other tools.  Be careful not to burn yourself when you iron or cook.  Ask your doctor if you can drink alcohol.  Only take medicines as told by your doctor.  Tell all your doctors and your dentist that you have this bleeding problem. GET HELP RIGHT AWAY IF:  You are bleeding anywhere on your body.  You are bleeding or have bruises without knowing why.  You have blood in your spit, pee, or poop. MAKE SURE YOU:  Understand these instructions.  Will watch your condition.  Will get help right away if you are not doing well or get worse. Document Released: 10/31/2011 Document Revised: 02/03/2012 Document Reviewed: 10/31/2011 ExitCare Patient Information 2015 [employer]. This information is not intended to replace advice given to you by your health care provider. Make sure you discuss any questions you have with your health care provider.  

## 2015-08-18 NOTE — Progress Notes (Signed)
[name] feels very good this morning. The bone marrow came back consistent with immune thrombocytopenia. He had megakaryocytes. There is no obvious infiltrative issue.  He's had no bleeding. His been no mucosal bleeding from his gums. He's had no hemoptysis. He's had no melena or bright or blood per rectum.  His blood count yesterday was 53,000. Results not back yet today.  He got 2 units of blood.  He has no dizziness.  Hopefully, he will be able to go home today. He has the Promacta at home.  He should have an appointment in our office on Tuesday the 27th.   All his vital signs are stable. Blood pressure 112/59. Temperature 98.2. Pulse 62. His lungs are clear. Cardiac exam regular rate and rhythm with no murmurs, rubs or bruits. Abdomen is soft. He has good bowel sounds. He has well-healed laparoscopy scar. Extremities shows no clubbing, cyanosis or edema. Skin exam shows no rashes.  Again, I would think that he should be able to go home today. His blood count has come up quite nicely. He is asymptomatic.  Hopefully, the Promacta will be able to maintain his platelet count. I think that if his blood count stays above 20,000, then he should have a fairly low risk of bleeding.  I appreciate all the great care that he is gotten from everybody up on [address]   [place] 91:15-16

## 2015-08-18 NOTE — Progress Notes (Signed)
Patient discharged to home, Vitals stable for patient. IV and telemetry dc'd. Home med returned to patient.  Discharge instructions and education reviewed with patient and all questions addressed. 08/18/2015 12:52 PM Scott Waters

## 2015-08-22 ENCOUNTER — Ambulatory Visit (HOSPITAL_BASED_OUTPATIENT_CLINIC_OR_DEPARTMENT_OTHER): Payer: BLUE CROSS/BLUE SHIELD | Admitting: Family

## 2015-08-22 ENCOUNTER — Encounter: Payer: Self-pay | Admitting: Family

## 2015-08-22 ENCOUNTER — Other Ambulatory Visit (HOSPITAL_BASED_OUTPATIENT_CLINIC_OR_DEPARTMENT_OTHER): Payer: BLUE CROSS/BLUE SHIELD

## 2015-08-22 VITALS — BP 135/73 | HR 79 | Temp 99.5°F | Resp 16 | Ht 69.0 in | Wt 194.0 lb

## 2015-08-22 DIAGNOSIS — D693 Immune thrombocytopenic purpura: Secondary | ICD-10-CM | POA: Diagnosis not present

## 2015-08-22 LAB — CBC WITH DIFFERENTIAL (CANCER CENTER ONLY)
BASO#: 0 10*3/uL (ref 0.0–0.2)
BASO%: 0.2 % (ref 0.0–2.0)
EOS%: 0.9 % (ref 0.0–7.0)
Eosinophils Absolute: 0.2 10*3/uL (ref 0.0–0.5)
HEMATOCRIT: 31.8 % — AB (ref 38.7–49.9)
HEMOGLOBIN: 10.5 g/dL — AB (ref 13.0–17.1)
LYMPH#: 1.3 10*3/uL (ref 0.9–3.3)
LYMPH%: 8.3 % — ABNORMAL LOW (ref 14.0–48.0)
MCH: 29.8 pg (ref 28.0–33.4)
MCHC: 33 g/dL (ref 32.0–35.9)
MCV: 90 fL (ref 82–98)
MONO#: 1.9 10*3/uL — AB (ref 0.1–0.9)
MONO%: 11.8 % (ref 0.0–13.0)
NEUT%: 78.8 % (ref 40.0–80.0)
NEUTROS ABS: 12.6 10*3/uL — AB (ref 1.5–6.5)
Platelets: 38 10*3/uL — ABNORMAL LOW (ref 145–400)
RBC: 3.52 10*6/uL — AB (ref 4.20–5.70)
RDW: 15.6 % (ref 11.1–15.7)
WBC: 16.1 10*3/uL — ABNORMAL HIGH (ref 4.0–10.0)

## 2015-08-22 LAB — CHCC SATELLITE - SMEAR

## 2015-08-22 NOTE — Progress Notes (Signed)
Hematology and Oncology Follow Up Visit  Scott Waters [id number] 1956/08/02 59 y.o. 08/22/2015   Principle Diagnosis:  Refractory immune thrombocytopenia - s/p splenectomy   Current Therapy:   Promacta 50 mg Po daily     Interim History:  [name] is here today for a follow-up. He was hospitalized last week for bleeding gums. His platelet count at that time was 5 and he had a Hgb of 9. He received 2 units of blood and IVIG during admission. 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 While in the hospital his approval for Promacta came through. He started this on Friday.   He is still feeling a little fatigued but states that it is improving.  His platelet count is now 38. He has had no more episodes of bleeding.  He has had no fever, chills, n/v, cough, rash, dizziness, SOB, palpitations, abdominal pain, changes in bowel or bladder habits. No swelling or tenderness in his extremities.  His appetite is improving but he states that food still does not taste good. He is staying hydrated. His weight is up 2 lbs since his last visit.   Medications:    Medication List       This list is accurate as of: 08/22/15  3:07 PM.  Always use your most recent med list.               AZOR 10-40 MG tablet  Generic drug:  amLODipine-olmesartan  Take 1 tablet by mouth every morning.     CINNAMON PO  Take 1,000 mg by mouth every morning.     eltrombopag 50 MG tablet  Commonly known as:  PROMACTA  Take 1 tablet (50 mg total) by mouth daily. Take on an empty stomach 1 hour before a meal or 2 hours after     fexofenadine 180 MG tablet  Commonly known as:  ALLEGRA  Take 180 mg by mouth every morning.     fluticasone 50 MCG/ACT nasal spray  Commonly known as:  FLONASE  instill 2 sprays into each nostril once daily     folic acid 1 MG tablet  Commonly known as:  FOLVITE  Take 1 tablet (1 mg total) by mouth daily.     multivitamin with minerals Tabs tablet  Take 1 tablet by  mouth every morning.     pantoprazole 40 MG tablet  Commonly known as:  PROTONIX  Take 1 tablet (40 mg total) by mouth daily.     potassium chloride 10 MEQ tablet  Commonly known as:  K-DUR,KLOR-CON  take 2 tablets by mouth once daily     PROBIOTIC DAILY PO  Take 1 tablet by mouth every morning.     VITAMIN K PO  Take 100 mcg by mouth every morning. OTC med        Allergies: No Known Allergies  Past Medical History, Surgical history, Social history, and Family History were reviewed and updated.  Review of Systems: All other 10 point review of systems is negative.   Physical Exam:  height is 5\' 9"  (1.753 m) and weight is 194 lb (87.998 kg). His oral temperature is 99.5 F (37.5 C). His blood pressure is 135/73 and his pulse is 79. His respiration is 16.   Wt Readings from Last 3 Encounters:  08/22/15 194 lb (87.998 kg)  08/13/15 192 lb 14.4 oz (87.5 kg)  08/02/15 192 lb (87.091 kg)    Ocular: Sclerae unicteric, pupils equal, round and reactive to light  Ear-nose-throat: Oropharynx clear, dentition fair Lymphatic: No cervical or supraclavicular adenopathy Lungs no rales or rhonchi, good excursion bilaterally Heart regular rate and rhythm, no murmur appreciated Abd soft, nontender, positive bowel sounds MSK no focal spinal tenderness, no joint edema Neuro: non-focal, well-oriented, appropriate affect Breasts: Deferred  Lab Results  Component Value Date   WBC 16.0* 08/18/2015   HGB 10.2* 08/18/2015   HCT 30.3* 08/18/2015   MCV 88.9 08/18/2015   PLT 71* 08/18/2015   No results found for: FERRITIN, IRON, TIBC, UIBC, IRONPCTSAT Lab Results  Component Value Date   RETICCTPCT 2.6* 05/13/2014   RBC 3.41* 08/18/2015   RETICCTABS 91.0 05/13/2014   No results found for: KPAFRELGTCHN, LAMBDASER, KAPLAMBRATIO No results found for: IGGSERUM, IGA, IGMSERUM No results found for: Odetta Pink, SPEI   Chemistry        Component Value Date/Time   NA 137 08/18/2015 0951   NA 139 07/20/2014 0902   K 3.2* 08/18/2015 0951   K 3.3 07/20/2014 0902   CL 110 08/18/2015 0951   CL 103 07/20/2014 0902   CO2 22 08/18/2015 0951   CO2 27 07/20/2014 0902   BUN 28* 08/18/2015 0951   BUN 11 07/20/2014 0902   CREATININE 1.23 08/18/2015 0951   CREATININE 0.9 07/20/2014 0902      Component Value Date/Time   CALCIUM 8.0* 08/18/2015 0951   CALCIUM 8.6 07/20/2014 0902   ALKPHOS 51 08/16/2015 0537   ALKPHOS 75 07/20/2014 0902   AST 30 08/16/2015 0537   AST 51* 07/20/2014 0902   ALT 28 08/16/2015 0537   ALT 36 07/20/2014 0902   BILITOT 0.6 08/16/2015 0537   BILITOT 0.80 07/20/2014 0902     Impression and Plan: Scott Waters is a pleasant 59 yo gentleman with refractory immune thrombocytopenia. He is recuperating from his hospital stay last week for bleeding gums and symptomatic anemia.  He started Promacta 50 mg daily on Friday. His platelet count is now 38 and has a Hgb of 10.5. He has had no more episodes of bleeding.  We will continue to check his labs weekly.  We will plan to see him back in 1 month for follow-up.  He knows to contact us with any questions or concerns. We can certainly see him sooner if need be.   Eliezer Bottom, NP 9/27/20163:07 PM

## 2015-08-23 ENCOUNTER — Other Ambulatory Visit: Payer: BLUE CROSS/BLUE SHIELD

## 2015-08-23 ENCOUNTER — Encounter: Payer: Self-pay | Admitting: *Deleted

## 2015-08-23 ENCOUNTER — Telehealth: Payer: Self-pay | Admitting: *Deleted

## 2015-08-23 NOTE — Telephone Encounter (Signed)
Patient wants to know when he can return to work. Dr [name] states he can return to work on October 3rd, this Monday. Patient aware. He states he doesn't think he needs a letter, but will call office back if he does.

## 2015-08-24 ENCOUNTER — Ambulatory Visit (HOSPITAL_BASED_OUTPATIENT_CLINIC_OR_DEPARTMENT_OTHER): Payer: BLUE CROSS/BLUE SHIELD | Admitting: Hematology & Oncology

## 2015-08-24 ENCOUNTER — Ambulatory Visit: Payer: BLUE CROSS/BLUE SHIELD

## 2015-08-24 ENCOUNTER — Ambulatory Visit (HOSPITAL_BASED_OUTPATIENT_CLINIC_OR_DEPARTMENT_OTHER)
Admission: RE | Admit: 2015-08-24 | Discharge: 2015-08-24 | Disposition: A | Discharge status: Home / Self Care | Payer: BLUE CROSS/BLUE SHIELD | Source: Ambulatory Visit | Attending: Hematology & Oncology | Admitting: Hematology & Oncology

## 2015-08-24 ENCOUNTER — Ambulatory Visit (HOSPITAL_BASED_OUTPATIENT_CLINIC_OR_DEPARTMENT_OTHER): Payer: BLUE CROSS/BLUE SHIELD

## 2015-08-24 ENCOUNTER — Other Ambulatory Visit: Payer: Self-pay

## 2015-08-24 DIAGNOSIS — R079 Chest pain, unspecified: Secondary | ICD-10-CM | POA: Insufficient documentation

## 2015-08-24 DIAGNOSIS — D693 Immune thrombocytopenic purpura: Secondary | ICD-10-CM

## 2015-08-24 DIAGNOSIS — R918 Other nonspecific abnormal finding of lung field: Secondary | ICD-10-CM | POA: Diagnosis not present

## 2015-08-24 DIAGNOSIS — R0602 Shortness of breath: Secondary | ICD-10-CM | POA: Insufficient documentation

## 2015-08-24 DIAGNOSIS — J189 Pneumonia, unspecified organism: Secondary | ICD-10-CM

## 2015-08-24 DIAGNOSIS — Z87891 Personal history of nicotine dependence: Secondary | ICD-10-CM | POA: Diagnosis not present

## 2015-08-24 DIAGNOSIS — R05 Cough: Secondary | ICD-10-CM | POA: Insufficient documentation

## 2015-08-24 DIAGNOSIS — J150 Pneumonia due to Klebsiella pneumoniae: Secondary | ICD-10-CM

## 2015-08-24 MED ORDER — DEXTROSE 5 % IV SOLN
2.0000 g | INTRAVENOUS | Status: DC
  Administered 2015-08-24: 2 g via INTRAVENOUS
  Filled 2015-08-24: qty 2

## 2015-08-24 MED ORDER — CEFDINIR 300 MG PO CAPS
600.0000 mg | ORAL_CAPSULE | Freq: Two times a day (BID) | ORAL | Status: DC
Start: 2015-08-24 — End: 2015-09-06

## 2015-08-24 MED ORDER — SODIUM CHLORIDE 0.9 % IV SOLN
INTRAVENOUS | Status: DC
  Administered 2015-08-24: 13:00:00 via INTRAVENOUS

## 2015-08-24 NOTE — Progress Notes (Signed)
Hematology and Oncology Follow Up Visit  Scott Waters [id number] [birth year] 60 y.o. 08/24/2015   Principle Diagnosis:  Refractory immune thrombocytopenia - s/p splenectomy   Current Therapy:   Promacta 50 mg Po daily     Interim History:  Scott Waters is here today for an unscheduled visit. He was having some chest pain. This is mostly on his right side. Had a little bit of a cough. He did have a fever.  We did get a chest x-ray on him. This did show a right lower lobe infiltrate compatible with pneumonia.  He's had no bleeding. He has started the Promacta. He is doing okay with this.  He's had a little bit of diarrhea but this has been well-controlled with Imodium.  He's had no leg swelling.  He's had no sweats.   Medications:    Medication List       This list is accurate as of: 08/24/15 12:19 PM.  Always use your most recent med list.               AZOR 10-40 MG tablet  Generic drug:  amLODipine-olmesartan  Take 1 tablet by mouth every morning.     CINNAMON PO  Take 1,000 mg by mouth every morning.     eltrombopag 50 MG tablet  Commonly known as:  PROMACTA  Take 1 tablet (50 mg total) by mouth daily. Take on an empty stomach 1 hour before a meal or 2 hours after     fexofenadine 180 MG tablet  Commonly known as:  ALLEGRA  Take 180 mg by mouth every morning.     fluticasone 50 MCG/ACT nasal spray  Commonly known as:  FLONASE  instill 2 sprays into each nostril once daily     folic acid 1 MG tablet  Commonly known as:  FOLVITE  Take 1 tablet (1 mg total) by mouth daily.     multivitamin with minerals Tabs tablet  Take 1 tablet by mouth every morning.     pantoprazole 40 MG tablet  Commonly known as:  PROTONIX  Take 1 tablet (40 mg total) by mouth daily.     potassium chloride 10 MEQ tablet  Commonly known as:  K-DUR,KLOR-CON  take 2 tablets by mouth once daily     PROBIOTIC DAILY PO  Take 1 tablet by mouth every morning.     VITAMIN K PO    Take 100 mcg by mouth every morning. OTC med        Allergies: No Known Allergies  Past Medical History, Surgical history, Social history, and Family History were reviewed and updated.  Review of Systems: All other 10 point review of systems is negative.   Physical Exam:  vitals were not taken for this visit.  Wt Readings from Last 3 Encounters:  08/22/15 194 lb (87.998 kg)  08/13/15 192 lb 14.4 oz (87.5 kg)  08/02/15 192 lb (87.091 kg)    Well-developed and well-nourished [place] American male. Head and neck exam shows no ocular or oral lesions. He has no palpable cervical or supraclavicular lymph nodes. Lungs are with crackles over on the right side. He has some decreased air movement in the right lower lung. Left lung is clear. Cardiac exam regular rate and rhythm with no murmurs, rubs or bruits. Abdomen is soft. He has good bowel sounds. There is no guarding or rebound tenderness. Extremities shows no clubbing, cyanosis or edema. Skin exam shows no ecchymoses or petechia.   Lab Results  Component Value  Date   WBC 16.1* 08/22/2015   HGB 10.5* 08/22/2015   HCT 31.8* 08/22/2015   MCV 90 08/22/2015   PLT 38 Few Large platelets present* 08/22/2015   No results found for: FERRITIN, IRON, TIBC, UIBC, IRONPCTSAT Lab Results  Component Value Date   RETICCTPCT 2.6* 05/13/2014   RBC 3.52* 08/22/2015   RETICCTABS 91.0 05/13/2014   No results found for: KPAFRELGTCHN, LAMBDASER, KAPLAMBRATIO No results found for: IGGSERUM, IGA, IGMSERUM No results found for: Odetta Pink, SPEI   Chemistry      Component Value Date/Time   NA 137 08/18/2015 0951   NA 139 07/20/2014 0902   K 3.2* 08/18/2015 0951   K 3.3 07/20/2014 0902   CL 110 08/18/2015 0951   CL 103 07/20/2014 0902   CO2 22 08/18/2015 0951   CO2 27 07/20/2014 0902   BUN 28* 08/18/2015 0951   BUN 11 07/20/2014 0902   CREATININE 1.23 08/18/2015 0951   CREATININE 0.9  07/20/2014 0902      Component Value Date/Time   CALCIUM 8.0* 08/18/2015 0951   CALCIUM 8.6 07/20/2014 0902   ALKPHOS 51 08/16/2015 0537   ALKPHOS 75 07/20/2014 0902   AST 30 08/16/2015 0537   AST 51* 07/20/2014 0902   ALT 28 08/16/2015 0537   ALT 36 07/20/2014 0902   BILITOT 0.6 08/16/2015 0537   BILITOT 0.80 07/20/2014 0902     Impression and Plan: Scott Waters is a pleasant 59 yo gentleman with refractory immune thrombocytopenia. He was recently hospitalized.  I suspect that the pneumonia may have started when he was in the hospital. Then again, it could be community-acquired.  I will go ahead and give him a dose of Rocephin intravenously. I will then put him on some e can certainly see him sooner if need be.   I will then put on some oral Omnicef at 300 mg by mouth twice a day for 7 days.  He comes back weekly for lab work. We'll see how he is managing.  We spent about 30 minutes with him today. I want to make sure that we they will we needed to do to minimize his risk of being readmitted.  Scott Napoleon, MD 9/29/201612:19 PM

## 2015-08-24 NOTE — Addendum Note (Signed)
Addended by: Burney Gauze R on: 08/24/2015 01:08 PM   Modules accepted: Orders

## 2015-08-24 NOTE — Patient Instructions (Signed)
Ceftriaxone injection What is this medicine? CEFTRIAXONE (sef try AX one) is a cephalosporin antibiotic. It is used to treat certain kinds of bacterial infections. It will not work for colds, flu, or other viral infections. This medicine may be used for other purposes; ask your health care provider or pharmacist if you have questions. COMMON BRAND NAME(S): Rocephin What should I tell my health care provider before I take this medicine? They need to know if you have any of these conditions: -any chronic illness -bowel disease, like colitis -both kidney and liver disease -high bilirubin level in newborn patients -an unusual or allergic reaction to ceftriaxone, other cephalosporin or penicillin antibiotics, foods, dyes or preservatives -pregnant or trying to get pregnant -breast-feeding How should I use this medicine? This medicine is injected into a muscle or infused it into a vein. It is usually given in a medical office or clinic. If you are to give this medicine you will be taught how to inject it. Follow instructions carefully. Use your doses at regular intervals. Do not take your medicine more often than directed. Do not skip doses or stop your medicine early even if you feel better. Do not stop taking except on your doctor's advice. Talk to your pediatrician regarding the use of this medicine in children. Special care may be needed. Overdosage: If you think you have taken too much of this medicine contact a poison control center or emergency room at once. NOTE: This medicine is only for you. Do not share this medicine with others. What if I miss a dose? If you miss a dose, take it as soon as you can. If it is almost time for your next dose, take only that dose. Do not take double or extra doses. What may interact with this medicine? Do not take this medicine with any of the following medications: -intravenous calcium This medicine may also interact with the following medications: -birth  control pills This list may not describe all possible interactions. Give your health care provider a list of all the medicines, herbs, non-prescription drugs, or dietary supplements you use. Also tell them if you smoke, drink alcohol, or use illegal drugs. Some items may interact with your medicine. What should I watch for while using this medicine? Tell your doctor or health care professional if your symptoms do not improve or if they get worse. Do not treat diarrhea with over the counter products. Contact your doctor if you have diarrhea that lasts more than 2 days or if it is severe and watery. If you are being treated for a sexually transmitted disease, avoid sexual contact until you have finished your treatment. Having sex can infect your sexual partner. Calcium may bind to this medicine and cause lung or kidney problems. Avoid calcium products while taking this medicine and for 48 hours after taking the last dose of this medicine. What side effects may I notice from receiving this medicine? Side effects that you should report to your doctor or health care professional as soon as possible: -allergic reactions like skin rash, itching or hives, swelling of the face, lips, or tongue -breathing problems -fever, chills -irregular heartbeat -pain when passing urine -seizures -stomach pain, cramps -unusual bleeding, bruising -unusually weak or tired Side effects that usually do not require medical attention (report to your doctor or health care professional if they continue or are bothersome): -diarrhea -dizzy, drowsy -headache -nausea, vomiting -pain, swelling, irritation where injected -stomach upset -sweating This list may not describe all possible side effects.   Call your doctor for medical advice about side effects. You may report side effects to FDA at 1-800-FDA-1088. Where should I keep my medicine? Keep out of the reach of children. Store at room temperature below 25 degrees C (77  degrees F). Protect from light. Throw away any unused vials after the expiration date. NOTE: This sheet is a summary. It may not cover all possible information. If you have questions about this medicine, talk to your doctor, pharmacist, or health care provider.  2015, [employer]. (2013-06-17 15:59:53)  

## 2015-08-25 ENCOUNTER — Other Ambulatory Visit: Payer: Self-pay | Admitting: *Deleted

## 2015-08-28 ENCOUNTER — Ambulatory Visit (HOSPITAL_BASED_OUTPATIENT_CLINIC_OR_DEPARTMENT_OTHER): Payer: BLUE CROSS/BLUE SHIELD

## 2015-08-28 ENCOUNTER — Ambulatory Visit (HOSPITAL_BASED_OUTPATIENT_CLINIC_OR_DEPARTMENT_OTHER): Payer: BLUE CROSS/BLUE SHIELD | Admitting: Hematology & Oncology

## 2015-08-28 ENCOUNTER — Telehealth: Payer: Self-pay | Admitting: *Deleted

## 2015-08-28 ENCOUNTER — Other Ambulatory Visit (HOSPITAL_BASED_OUTPATIENT_CLINIC_OR_DEPARTMENT_OTHER): Payer: BLUE CROSS/BLUE SHIELD

## 2015-08-28 ENCOUNTER — Encounter: Payer: Self-pay | Admitting: *Deleted

## 2015-08-28 ENCOUNTER — Ambulatory Visit (HOSPITAL_COMMUNITY)
Admission: RE | Admit: 2015-08-28 | Discharge: 2015-08-28 | Disposition: A | Discharge status: Home / Self Care | Payer: BLUE CROSS/BLUE SHIELD | Source: Ambulatory Visit | Attending: Hematology & Oncology | Admitting: Hematology & Oncology

## 2015-08-28 ENCOUNTER — Other Ambulatory Visit: Payer: Self-pay | Admitting: *Deleted

## 2015-08-28 ENCOUNTER — Encounter: Payer: Self-pay | Admitting: Hematology & Oncology

## 2015-08-28 VITALS — BP 123/65 | HR 90 | Temp 99.0°F | Resp 18

## 2015-08-28 DIAGNOSIS — D693 Immune thrombocytopenic purpura: Secondary | ICD-10-CM

## 2015-08-28 DIAGNOSIS — Z9081 Acquired absence of spleen: Secondary | ICD-10-CM

## 2015-08-28 DIAGNOSIS — D61811 Other drug-induced pancytopenia: Secondary | ICD-10-CM | POA: Insufficient documentation

## 2015-08-28 LAB — CBC WITH DIFFERENTIAL (CANCER CENTER ONLY)
BASO#: 0 10*3/uL (ref 0.0–0.2)
BASO%: 0.2 % (ref 0.0–2.0)
EOS%: 0.3 % (ref 0.0–7.0)
Eosinophils Absolute: 0.1 10*3/uL (ref 0.0–0.5)
HEMATOCRIT: 25.8 % — AB (ref 38.7–49.9)
HEMOGLOBIN: 8.4 g/dL — AB (ref 13.0–17.1)
LYMPH#: 1.4 10*3/uL (ref 0.9–3.3)
LYMPH%: 7.7 % — ABNORMAL LOW (ref 14.0–48.0)
MCH: 29.5 pg (ref 28.0–33.4)
MCHC: 32.6 g/dL (ref 32.0–35.9)
MCV: 91 fL (ref 82–98)
MONO#: 1.3 10*3/uL — ABNORMAL HIGH (ref 0.1–0.9)
MONO%: 6.9 % (ref 0.0–13.0)
NEUT#: 15.6 10*3/uL — ABNORMAL HIGH (ref 1.5–6.5)
NEUT%: 84.9 % — ABNORMAL HIGH (ref 40.0–80.0)
Platelets: <6 10*3/uL — CL (ref 145–400)
RBC: 2.85 10*6/uL — AB (ref 4.20–5.70)
RDW: 15.3 % (ref 11.1–15.7)
WBC: 18.4 10*3/uL — ABNORMAL HIGH (ref 4.0–10.0)

## 2015-08-28 LAB — CHROMOSOME ANALYSIS, BONE MARROW

## 2015-08-28 LAB — TECHNOLOGIST REVIEW CHCC SATELLITE

## 2015-08-28 MED ORDER — FAMOTIDINE IN NACL 20-0.9 MG/50ML-% IV SOLN
INTRAVENOUS | Status: AC
  Filled 2015-08-28: qty 50

## 2015-08-28 MED ORDER — SODIUM CHLORIDE 0.9 % IV SOLN
40.0000 mg | Freq: Once | INTRAVENOUS | Status: DC
  Administered 2015-08-28: 40 mg via INTRAVENOUS
  Filled 2015-08-28: qty 4

## 2015-08-28 MED ORDER — SODIUM CHLORIDE 0.9 % IV SOLN
250.0000 mL | Freq: Once | INTRAVENOUS | Status: AC
  Administered 2015-08-28: 250 mL via INTRAVENOUS

## 2015-08-28 MED ORDER — FAMOTIDINE IN NACL 20-0.9 MG/50ML-% IV SOLN
40.0000 mg | Freq: Once | INTRAVENOUS | Status: DC
  Administered 2015-08-28: 40 mg via INTRAVENOUS

## 2015-08-28 MED ORDER — DEXAMETHASONE SODIUM PHOSPHATE 20 MG/5ML IJ SOLN
INTRAMUSCULAR | Status: AC
Start: 2015-08-28 — End: 2015-08-28
  Filled 2015-08-28: qty 5

## 2015-08-28 MED ORDER — FAMOTIDINE IN NACL 20-0.9 MG/50ML-% IV SOLN
INTRAVENOUS | Status: AC
Start: 2015-08-28 — End: 2015-08-28
  Filled 2015-08-28: qty 50

## 2015-08-28 NOTE — Telephone Encounter (Signed)
Critical Value PLT <6 Dr Ennever notified and orders received.  

## 2015-08-28 NOTE — Patient Instructions (Signed)
Platelet Transfusion Information °This is information about transfusions of platelets. Platelets are tiny cells made by the bone marrow and found in the blood. When a blood vessel is damaged, platelets rush to the damaged area to help form a clot. This begins the healing process. When platelets get very low, your blood may have trouble clotting. This may be from: °· Illness. °· Blood disorder. °· Chemotherapy to treat cancer. °Often, lower platelet counts do not cause problems.  °Platelets usually last for 7 to 10 days. If they are not used in an injury, they are broken down by the liver or spleen. °Symptoms of low platelet count include: °· Nosebleeds. °· Bleeding gums. °· Heavy periods. °· Bruising and tiny blood spots in the skin. °¨ Pinpoint spots of bleeding (petechiae). °¨ Larger bruises (purpura). °· Bleeding can be more serious if it happens in the brain or bowel. °Platelet transfusions are often used to keep the platelet count at an acceptable level. Serious bleeding due to low platelets is uncommon. °RISKS AND COMPLICATIONS °Severe side effects from platelet transfusions are uncommon. Minor reactions may include: °· Itching. °· Rashes. °· High temperature and shivering. °Medications are available to stop transfusion reactions. Let your health care provider know if you develop any of the above problems.  °If you are having platelet transfusions frequently, they may get less effective. This is called becoming refractory to platelets. It is uncommon. This can happen from non-immune causes and immune causes. Non-immune causes include: °· High temperatures. °· Some medications. °· An enlarged spleen. °Immune causes happen when your body discovers the platelets are not your own and begins making antibodies against them. The antibodies kill the platelets quickly. Even with platelet transfusions, you may still notice problems with bleeding or bruising. Let your health care providers know about this. Other things  can be done to help if this happens.  °BEFORE THE PROCEDURE  °· Your health care provider will check your platelet count regularly. °· If the platelet count is too low, it may be necessary to have a platelet transfusion. °· This is more important before certain procedures with a risk of bleeding, such as a spinal tap. °· Platelet transfusion reduces the risk of bleeding during or after the procedure. °· Except in emergencies, giving a transfusion requires a written consent. °Before blood is taken from a donor, a complete history is taken to make sure the person has no history of previous diseases, nor engages in risky social behavior. Examples of this are intravenous drug use or sexual activity with multiple partners. This could lead to infected blood or blood products being used. This history is taken in spite of the extensive testing to make sure the blood is safe. All blood products transfused are tested to make sure it is a match for the person getting the blood. It is also checked for infections. Blood is the safest it has ever been. The risk of getting an infection is very low. °PROCEDURE °· The platelets are stored in small plastic bags that are kept at a low temperature. °· Each bag is called a unit and sometimes two units are given. They are given through an intravenous line by drip infusion over about one-half hour. °· Usually blood is collected from multiple people to get enough to transfuse. °· Sometimes, the platelets are collected from a single person. This is done using a special machine that separates the platelets from the blood. The machine is called an apheresis machine. Platelets collected in this   way are called apheresed platelets. Apheresed platelets reduce the risk of becoming sensitive to the platelets. This lowers the chances of having a transfusion reaction. °· As it only takes a short time to give the platelets, this treatment can be given in an outpatient department. Platelets can also be  given before or after other treatments. °SEEK IMMEDIATE MEDICAL CARE IF: °You have any of the following symptoms over the next 12 hours or several days: °· Shaking chills. °· Fever with a temperature greater than 102°F (38.9°C) develops. °· Back pain or muscle pain. °· People around you feel you are not acting correctly, or you are confused. °· Blood in the urine or bowel movements, or bleeding from any place in your body. °· Shortness of breath, or difficulty breathing. °· Dizziness. °· Fainting. °· You break out in a rash or develop hives. °· Decrease in the amount of urine you are putting out, or the urine turns a dark color or changes to pink, red, or brown. °· A severe headache or stiff neck. °· Bruising more easily. °Document Released: 09/08/2007 Document Revised: 03/28/2014 Document Reviewed: 09/08/2007 °Exit[employer]® Patient Information ©2015 [employer]. This information is not intended to replace advice given to you by your health care provider. Make sure you discuss any questions you have with your health care provider. ° °

## 2015-08-28 NOTE — Progress Notes (Signed)
Hematology and Oncology Follow Up Visit  Vincient Vanaman [id number] 08-09-1956 59 y.o. 08/28/2015   Principle Diagnosis:  Refractory immune thrombocytopenia - s/p splenectomy   Current Therapy:   Promacta 50 mg PO q daily     Interim History:  Mr. Teuscher is here today for an unscheduled visit. He did have some more bleeding. He has some bleeding from his mouth. I'm unsure if he was taking the Amicar mouth rinse. He also is having some nosebleeds. This basely started yesterday.  He is on Promacta. He is on 50 mg a day. He's tolerated this okay so far.  He's had no hematuria. There's been no blood per. He's had no hemoptysis.  There is has been no fever. He recently was found to have pneumonia. He is on oral antibiotics for the pneumonia. He's having some chest discomfort from the pneumonia.  It is clear that this thrombocytopenia is incredibly recalcitrant.  We did a bone marrow biopsy on him in the hospital. This really did not show [place] anything that was unusual. There is no obvious myelodysplastic issues.   His appetite has been okay.  He's had no obvious headache. There's no visual issues.   Medications:    Medication List       This list is accurate as of: 08/28/15 12:24 PM.  Always use your most recent med list.               AZOR 10-40 MG tablet  Generic drug:  amLODipine-olmesartan  Take 1 tablet by mouth every morning.     cefdinir 300 MG capsule  Commonly known as:  OMNICEF  Take 2 capsules (600 mg total) by mouth 2 (two) times daily.     CINNAMON PO  Take 1,000 mg by mouth every morning.     eltrombopag 50 MG tablet  Commonly known as:  PROMACTA  Take 1 tablet (50 mg total) by mouth daily. Take on an empty stomach 1 hour before a meal or 2 hours after     fexofenadine 180 MG tablet  Commonly known as:  ALLEGRA  Take 180 mg by mouth every morning.     fluticasone 50 MCG/ACT nasal spray  Commonly known as:  FLONASE  instill 2 sprays into each nostril  once daily     folic acid 1 MG tablet  Commonly known as:  FOLVITE  Take 1 tablet (1 mg total) by mouth daily.     multivitamin with minerals Tabs tablet  Take 1 tablet by mouth every morning.     pantoprazole 40 MG tablet  Commonly known as:  PROTONIX  Take 1 tablet (40 mg total) by mouth daily.     potassium chloride 10 MEQ tablet  Commonly known as:  K-DUR,KLOR-CON  take 2 tablets by mouth once daily     PROBIOTIC DAILY PO  Take 1 tablet by mouth every morning.     simvastatin 40 MG tablet  Commonly known as:  ZOCOR     VITAMIN K PO  Take 100 mcg by mouth every morning. OTC med        Allergies: No Known Allergies  Past Medical History, Surgical history, Social history, and Family History were reviewed and updated.  Review of Systems: All other 10 point review of systems is negative.   Physical Exam:  vitals were not taken for this visit.  Wt Readings from Last 3 Encounters:  08/22/15 194 lb (87.998 kg)  08/13/15 192 lb 14.4 oz (87.5 kg)  08/02/15  192 lb (87.091 kg)    Well-developed and well-nourished [place] American male. Head and neck exam shows no ocular or oral lesions. He has no palpable cervical or supraclavicular lymph nodes. Lungs are with crackles over on the right side. He has some decreased air movement in the right lower lung. Left lung is clear. Cardiac exam regular rate and rhythm with no murmurs, rubs or bruits. Abdomen is soft. He has good bowel sounds. There is no guarding or rebound tenderness. Extremities shows no clubbing, cyanosis or edema. Skin exam shows no ecchymoses or petechia.   Lab Results  Component Value Date   WBC 18.4* 08/28/2015   HGB 8.4* 08/28/2015   HCT 25.8* 08/28/2015   MCV 91 08/28/2015   PLT <6* 08/28/2015   No results found for: FERRITIN, IRON, TIBC, UIBC, IRONPCTSAT Lab Results  Component Value Date   RETICCTPCT 2.6* 05/13/2014   RBC 2.85* 08/28/2015   RETICCTABS 91.0 05/13/2014   No results found for:  KPAFRELGTCHN, LAMBDASER, KAPLAMBRATIO No results found for: IGGSERUM, IGA, IGMSERUM No results found for: Odetta Pink, SPEI   Chemistry      Component Value Date/Time   NA 137 08/18/2015 0951   NA 139 07/20/2014 0902   K 3.2* 08/18/2015 0951   K 3.3 07/20/2014 0902   CL 110 08/18/2015 0951   CL 103 07/20/2014 0902   CO2 22 08/18/2015 0951   CO2 27 07/20/2014 0902   BUN 28* 08/18/2015 0951   BUN 11 07/20/2014 0902   CREATININE 1.23 08/18/2015 0951   CREATININE 0.9 07/20/2014 0902      Component Value Date/Time   CALCIUM 8.0* 08/18/2015 0951   CALCIUM 8.6 07/20/2014 0902   ALKPHOS 51 08/16/2015 0537   ALKPHOS 75 07/20/2014 0902   AST 30 08/16/2015 0537   AST 51* 07/20/2014 0902   ALT 28 08/16/2015 0537   ALT 36 07/20/2014 0902   BILITOT 0.6 08/16/2015 0537   BILITOT 0.80 07/20/2014 0902     Impression and Plan: Mr. Calix is a pleasant 59 yo gentleman with refractory immune thrombocytopenia. Again, his thrombocytopenia is a [place].  He has only been on the Promacta for about 10 days. I don't think we have to increase the dose yet. The next dose would be 75 mg.  I think that we will have to give him platelets. Also want to give him IVIG. This seemed to work while he was in the hospital.  I know that we have not tried him on Campath. This is another option that we have.  For now, I will continue to check his blood weekly.    [name], MD 10/3/201612:24 PM

## 2015-08-29 ENCOUNTER — Telehealth: Payer: Self-pay | Admitting: Hematology & Oncology

## 2015-08-29 ENCOUNTER — Encounter (HOSPITAL_COMMUNITY): Payer: Self-pay

## 2015-08-29 LAB — PREPARE PLATELET PHERESIS: Unit division: 0

## 2015-08-29 NOTE — Telephone Encounter (Signed)
[employer] [place] NPR w an unknown cancer dx   I spoke w [employer] and [employer] today  CPT:  J1566 IVIG [id number] ROCEPHIN  P: [phone] - [employer] P: [phone] BCAL

## 2015-08-30 ENCOUNTER — Ambulatory Visit (HOSPITAL_BASED_OUTPATIENT_CLINIC_OR_DEPARTMENT_OTHER): Payer: BLUE CROSS/BLUE SHIELD

## 2015-08-30 VITALS — BP 109/58 | HR 82 | Temp 98.3°F | Resp 18

## 2015-08-30 DIAGNOSIS — Z9081 Acquired absence of spleen: Secondary | ICD-10-CM

## 2015-08-30 DIAGNOSIS — D693 Immune thrombocytopenic purpura: Secondary | ICD-10-CM

## 2015-08-30 MED ORDER — DIPHENHYDRAMINE HCL 25 MG PO CAPS
25.0000 mg | ORAL_CAPSULE | Freq: Once | ORAL | Status: AC
  Administered 2015-08-30: 25 mg via ORAL

## 2015-08-30 MED ORDER — IMMUNE GLOBULIN (HUMAN) 10 GM/100ML IV SOLN
1.0000 g/kg | Freq: Once | INTRAVENOUS | Status: AC
  Administered 2015-08-30: 90 g via INTRAVENOUS
  Filled 2015-08-30: qty 900

## 2015-08-30 MED ORDER — ACETAMINOPHEN 325 MG PO TABS
650.0000 mg | ORAL_TABLET | Freq: Once | ORAL | Status: AC
  Administered 2015-08-30: 650 mg via ORAL

## 2015-08-30 MED ORDER — DIPHENHYDRAMINE HCL 25 MG PO CAPS
ORAL_CAPSULE | ORAL | Status: AC
  Filled 2015-08-30: qty 1

## 2015-08-30 MED ORDER — ACETAMINOPHEN 325 MG PO TABS
ORAL_TABLET | ORAL | Status: AC
  Filled 2015-08-30: qty 2

## 2015-08-30 NOTE — Patient Instructions (Signed)

## 2015-08-31 ENCOUNTER — Ambulatory Visit (HOSPITAL_BASED_OUTPATIENT_CLINIC_OR_DEPARTMENT_OTHER): Payer: BLUE CROSS/BLUE SHIELD

## 2015-08-31 VITALS — BP 116/67 | HR 98 | Temp 98.4°F | Resp 18

## 2015-08-31 DIAGNOSIS — D693 Immune thrombocytopenic purpura: Secondary | ICD-10-CM | POA: Diagnosis not present

## 2015-08-31 DIAGNOSIS — Z9081 Acquired absence of spleen: Secondary | ICD-10-CM

## 2015-08-31 LAB — CALR MUTATAION(GENPATH)

## 2015-08-31 MED ORDER — DIPHENHYDRAMINE HCL 25 MG PO CAPS
ORAL_CAPSULE | ORAL | Status: AC
  Filled 2015-08-31: qty 1

## 2015-08-31 MED ORDER — ACETAMINOPHEN 325 MG PO TABS
650.0000 mg | ORAL_TABLET | Freq: Once | ORAL | Status: AC
  Administered 2015-08-31: 650 mg via ORAL

## 2015-08-31 MED ORDER — DIPHENHYDRAMINE HCL 25 MG PO CAPS
25.0000 mg | ORAL_CAPSULE | Freq: Once | ORAL | Status: AC
  Administered 2015-08-31: 25 mg via ORAL

## 2015-08-31 MED ORDER — IMMUNE GLOBULIN (HUMAN) 10 GM/100ML IV SOLN
90.0000 g | Freq: Once | INTRAVENOUS | Status: AC
  Administered 2015-08-31: 90 g via INTRAVENOUS
  Filled 2015-08-31: qty 900

## 2015-08-31 MED ORDER — ACETAMINOPHEN 325 MG PO TABS
ORAL_TABLET | ORAL | Status: AC
  Filled 2015-08-31: qty 2

## 2015-08-31 NOTE — Patient Instructions (Signed)

## 2015-09-01 ENCOUNTER — Other Ambulatory Visit: Payer: BLUE CROSS/BLUE SHIELD

## 2015-09-01 ENCOUNTER — Ambulatory Visit: Payer: BLUE CROSS/BLUE SHIELD | Admitting: Hematology & Oncology

## 2015-09-04 ENCOUNTER — Other Ambulatory Visit: Payer: Self-pay | Admitting: Hematology & Oncology

## 2015-09-04 ENCOUNTER — Other Ambulatory Visit (HOSPITAL_BASED_OUTPATIENT_CLINIC_OR_DEPARTMENT_OTHER): Payer: BLUE CROSS/BLUE SHIELD

## 2015-09-04 ENCOUNTER — Encounter: Payer: Self-pay | Admitting: *Deleted

## 2015-09-04 DIAGNOSIS — D693 Immune thrombocytopenic purpura: Secondary | ICD-10-CM

## 2015-09-04 DIAGNOSIS — D61811 Other drug-induced pancytopenia: Secondary | ICD-10-CM | POA: Diagnosis not present

## 2015-09-04 DIAGNOSIS — Z9081 Acquired absence of spleen: Secondary | ICD-10-CM

## 2015-09-04 LAB — CHCC SATELLITE - SMEAR

## 2015-09-04 LAB — CBC WITH DIFFERENTIAL (CANCER CENTER ONLY)
BASO#: 0 10*3/uL (ref 0.0–0.2)
BASO%: 0.2 % (ref 0.0–2.0)
EOS%: 0.7 % (ref 0.0–7.0)
Eosinophils Absolute: 0.1 10*3/uL (ref 0.0–0.5)
HEMATOCRIT: 23.8 % — AB (ref 38.7–49.9)
HGB: 7.3 g/dL — ABNORMAL LOW (ref 13.0–17.1)
LYMPH#: 1.3 10*3/uL (ref 0.9–3.3)
LYMPH%: 8.8 % — ABNORMAL LOW (ref 14.0–48.0)
MCH: 29 pg (ref 28.0–33.4)
MCHC: 30.7 g/dL — AB (ref 32.0–35.9)
MCV: 94 fL (ref 82–98)
MONO#: 1.2 10*3/uL — AB (ref 0.1–0.9)
MONO%: 8.3 % (ref 0.0–13.0)
NEUT#: 12.1 10*3/uL — ABNORMAL HIGH (ref 1.5–6.5)
NEUT%: 82 % — AB (ref 40.0–80.0)
Platelets: 103 10*3/uL — ABNORMAL LOW (ref 145–400)
RBC: 2.52 10*6/uL — ABNORMAL LOW (ref 4.20–5.70)
RDW: 16.7 % — AB (ref 11.1–15.7)
WBC: 14.7 10*3/uL — ABNORMAL HIGH (ref 4.0–10.0)

## 2015-09-04 LAB — HOLD TUBE, BLOOD BANK - CHCC SATELLITE

## 2015-09-04 LAB — PREPARE RBC (CROSSMATCH)

## 2015-09-04 LAB — TECHNOLOGIST REVIEW CHCC SATELLITE

## 2015-09-04 NOTE — Progress Notes (Signed)
Platelets 103 and Hgb 7.3.  Reviewed with Dr. [name].  Patient very fatigued and no energy.  Goes back to work this weekend.  Offered patient to get blood transfusion which he wants to do.  Orders put in by Dr. [name].

## 2015-09-05 ENCOUNTER — Telehealth: Payer: Self-pay | Admitting: Hematology & Oncology

## 2015-09-05 NOTE — Telephone Encounter (Signed)
[employer] APPROVAL  Case Number: [id number] Status: Approved Dates: 08/30/2015 - 09/30/2015   AMICAR SOL 0.25/ML   P: [phone]    COPY SCANNED

## 2015-09-06 ENCOUNTER — Telehealth: Payer: Self-pay

## 2015-09-06 ENCOUNTER — Encounter (HOSPITAL_COMMUNITY): Payer: Self-pay | Admitting: *Deleted

## 2015-09-06 ENCOUNTER — Other Ambulatory Visit: Payer: Self-pay

## 2015-09-06 ENCOUNTER — Ambulatory Visit (HOSPITAL_BASED_OUTPATIENT_CLINIC_OR_DEPARTMENT_OTHER): Payer: BLUE CROSS/BLUE SHIELD

## 2015-09-06 ENCOUNTER — Ambulatory Visit (HOSPITAL_BASED_OUTPATIENT_CLINIC_OR_DEPARTMENT_OTHER)
Admission: RE | Admit: 2015-09-06 | Discharge: 2015-09-06 | Disposition: A | Discharge status: Home / Self Care | Payer: BLUE CROSS/BLUE SHIELD | Source: Ambulatory Visit | Attending: Hematology & Oncology | Admitting: Hematology & Oncology

## 2015-09-06 ENCOUNTER — Ambulatory Visit (HOSPITAL_BASED_OUTPATIENT_CLINIC_OR_DEPARTMENT_OTHER): Payer: BLUE CROSS/BLUE SHIELD | Admitting: Family

## 2015-09-06 ENCOUNTER — Other Ambulatory Visit: Payer: Self-pay | Admitting: Hematology & Oncology

## 2015-09-06 ENCOUNTER — Inpatient Hospital Stay (HOSPITAL_BASED_OUTPATIENT_CLINIC_OR_DEPARTMENT_OTHER)
Admission: AD | Admit: 2015-09-06 | Discharge: 2015-09-11 | DRG: 194 | Disposition: A | Discharge status: Home / Self Care | Payer: BLUE CROSS/BLUE SHIELD | Source: Ambulatory Visit | Attending: Hematology & Oncology | Admitting: Hematology & Oncology

## 2015-09-06 VITALS — BP 130/79 | HR 87 | Temp 99.8°F | Resp 18

## 2015-09-06 DIAGNOSIS — Z9889 Other specified postprocedural states: Secondary | ICD-10-CM | POA: Diagnosis not present

## 2015-09-06 DIAGNOSIS — D899 Disorder involving the immune mechanism, unspecified: Secondary | ICD-10-CM | POA: Diagnosis present

## 2015-09-06 DIAGNOSIS — D61811 Other drug-induced pancytopenia: Secondary | ICD-10-CM

## 2015-09-06 DIAGNOSIS — K219 Gastro-esophageal reflux disease without esophagitis: Secondary | ICD-10-CM | POA: Diagnosis present

## 2015-09-06 DIAGNOSIS — M1990 Unspecified osteoarthritis, unspecified site: Secondary | ICD-10-CM | POA: Diagnosis present

## 2015-09-06 DIAGNOSIS — J302 Other seasonal allergic rhinitis: Secondary | ICD-10-CM | POA: Diagnosis present

## 2015-09-06 DIAGNOSIS — I10 Essential (primary) hypertension: Secondary | ICD-10-CM | POA: Diagnosis present

## 2015-09-06 DIAGNOSIS — D649 Anemia, unspecified: Secondary | ICD-10-CM | POA: Diagnosis present

## 2015-09-06 DIAGNOSIS — J189 Pneumonia, unspecified organism: Secondary | ICD-10-CM | POA: Diagnosis present

## 2015-09-06 DIAGNOSIS — D693 Immune thrombocytopenic purpura: Secondary | ICD-10-CM

## 2015-09-06 DIAGNOSIS — Z800 Family history of malignant neoplasm of digestive organs: Secondary | ICD-10-CM

## 2015-09-06 DIAGNOSIS — J188 Other pneumonia, unspecified organism: Secondary | ICD-10-CM | POA: Diagnosis not present

## 2015-09-06 DIAGNOSIS — J90 Pleural effusion, not elsewhere classified: Secondary | ICD-10-CM | POA: Diagnosis present

## 2015-09-06 DIAGNOSIS — Z86010 Personal history of colonic polyps: Secondary | ICD-10-CM | POA: Diagnosis not present

## 2015-09-06 DIAGNOSIS — Z823 Family history of stroke: Secondary | ICD-10-CM | POA: Diagnosis not present

## 2015-09-06 DIAGNOSIS — M109 Gout, unspecified: Secondary | ICD-10-CM | POA: Diagnosis present

## 2015-09-06 DIAGNOSIS — Z8249 Family history of ischemic heart disease and other diseases of the circulatory system: Secondary | ICD-10-CM | POA: Diagnosis not present

## 2015-09-06 DIAGNOSIS — E785 Hyperlipidemia, unspecified: Secondary | ICD-10-CM | POA: Diagnosis present

## 2015-09-06 DIAGNOSIS — R7989 Other specified abnormal findings of blood chemistry: Secondary | ICD-10-CM | POA: Diagnosis present

## 2015-09-06 DIAGNOSIS — R350 Frequency of micturition: Secondary | ICD-10-CM | POA: Diagnosis present

## 2015-09-06 DIAGNOSIS — D638 Anemia in other chronic diseases classified elsewhere: Secondary | ICD-10-CM | POA: Diagnosis not present

## 2015-09-06 DIAGNOSIS — Z7901 Long term (current) use of anticoagulants: Secondary | ICD-10-CM | POA: Diagnosis not present

## 2015-09-06 DIAGNOSIS — J181 Lobar pneumonia, unspecified organism: Secondary | ICD-10-CM

## 2015-09-06 DIAGNOSIS — D72829 Elevated white blood cell count, unspecified: Secondary | ICD-10-CM | POA: Diagnosis not present

## 2015-09-06 DIAGNOSIS — Z833 Family history of diabetes mellitus: Secondary | ICD-10-CM

## 2015-09-06 DIAGNOSIS — F17210 Nicotine dependence, cigarettes, uncomplicated: Secondary | ICD-10-CM | POA: Diagnosis present

## 2015-09-06 DIAGNOSIS — Z9081 Acquired absence of spleen: Secondary | ICD-10-CM

## 2015-09-06 LAB — CBC
HEMATOCRIT: 29.4 % — AB (ref 39.0–52.0)
Hemoglobin: 9.4 g/dL — ABNORMAL LOW (ref 13.0–17.0)
MCH: 28.4 pg (ref 26.0–34.0)
MCHC: 32 g/dL (ref 30.0–36.0)
MCV: 88.8 fL (ref 78.0–100.0)
PLATELETS: 88 10*3/uL — AB (ref 150–400)
RBC: 3.31 MIL/uL — ABNORMAL LOW (ref 4.22–5.81)
RDW: 16.4 % — AB (ref 11.5–15.5)
WBC: 23.2 10*3/uL — AB (ref 4.0–10.5)

## 2015-09-06 LAB — CREATININE, SERUM
Creatinine, Ser: 1.03 mg/dL (ref 0.61–1.24)
GFR calc non Af Amer: >60 mL/min (ref >60)

## 2015-09-06 LAB — PREPARE RBC (CROSSMATCH)

## 2015-09-06 MED ORDER — SODIUM CHLORIDE 0.9 % IV SOLN: 250.0000 mL | Freq: Once | INTRAVENOUS | Status: AC

## 2015-09-06 MED ORDER — FLUTICASONE PROPIONATE 50 MCG/ACT NA SUSP
1.0000 | Freq: Every day | NASAL | Status: DC
  Administered 2015-09-07 – 2015-09-11 (×4): 1 via NASAL
  Filled 2015-09-06: qty 16

## 2015-09-06 MED ORDER — VANCOMYCIN HCL 10 G IV SOLR
1500.0000 mg | INTRAVENOUS | Status: AC
  Administered 2015-09-06: 1500 mg via INTRAVENOUS
  Filled 2015-09-06: qty 1500

## 2015-09-06 MED ORDER — DEXTROSE 5 % IV SOLN
2.0000 g | Freq: Two times a day (BID) | INTRAVENOUS | Status: DC
  Administered 2015-09-07: 2 g via INTRAVENOUS
  Filled 2015-09-06: qty 2

## 2015-09-06 MED ORDER — AMLODIPINE-OLMESARTAN 10-40 MG PO TABS: 1.0000 | ORAL_TABLET | Freq: Every morning | ORAL | Status: DC

## 2015-09-06 MED ORDER — ENOXAPARIN SODIUM 40 MG/0.4ML ~~LOC~~ SOLN
40.0000 mg | SUBCUTANEOUS | Status: DC
  Administered 2015-09-06 – 2015-09-07 (×2): 40 mg via SUBCUTANEOUS
  Filled 2015-09-06 (×3): qty 0.4

## 2015-09-06 MED ORDER — DEXTROSE 5 % IV SOLN
2.0000 g | Freq: Two times a day (BID) | INTRAVENOUS | Status: DC
  Filled 2015-09-06: qty 2

## 2015-09-06 MED ORDER — HEPARIN SOD (PORK) LOCK FLUSH 100 UNIT/ML IV SOLN: 250.0000 [IU] | INTRAVENOUS | Status: DC | PRN

## 2015-09-06 MED ORDER — FOLIC ACID 1 MG PO TABS
1.0000 mg | ORAL_TABLET | Freq: Every day | ORAL | Status: DC
  Administered 2015-09-06 – 2015-09-11 (×6): 1 mg via ORAL
  Filled 2015-09-06 (×6): qty 1

## 2015-09-06 MED ORDER — FUROSEMIDE 10 MG/ML IJ SOLN: 20.0000 mg | Freq: Once | INTRAMUSCULAR | Status: DC

## 2015-09-06 MED ORDER — ALBUTEROL SULFATE (2.5 MG/3ML) 0.083% IN NEBU: 2.5000 mg | INHALATION_SOLUTION | Freq: Four times a day (QID) | RESPIRATORY_TRACT | Status: DC

## 2015-09-06 MED ORDER — HEPARIN SOD (PORK) LOCK FLUSH 100 UNIT/ML IV SOLN: 500.0000 [IU] | Freq: Every day | INTRAVENOUS | Status: DC | PRN

## 2015-09-06 MED ORDER — PANTOPRAZOLE SODIUM 40 MG PO TBEC
40.0000 mg | DELAYED_RELEASE_TABLET | Freq: Every day | ORAL | Status: DC
  Administered 2015-09-06 – 2015-09-11 (×6): 40 mg via ORAL
  Filled 2015-09-06 (×7): qty 1

## 2015-09-06 MED ORDER — AMLODIPINE BESYLATE 10 MG PO TABS
10.0000 mg | ORAL_TABLET | Freq: Every day | ORAL | Status: DC
  Administered 2015-09-07 – 2015-09-11 (×5): 10 mg via ORAL
  Filled 2015-09-06 (×5): qty 1

## 2015-09-06 MED ORDER — IPRATROPIUM-ALBUTEROL 0.5-2.5 (3) MG/3ML IN SOLN
3.0000 mL | Freq: Four times a day (QID) | RESPIRATORY_TRACT | Status: DC
  Administered 2015-09-06: 3 mL via RESPIRATORY_TRACT
  Filled 2015-09-06: qty 3

## 2015-09-06 MED ORDER — FAMOTIDINE IN NACL 20-0.9 MG/50ML-% IV SOLN
INTRAVENOUS | Status: AC
  Filled 2015-09-06: qty 50

## 2015-09-06 MED ORDER — SODIUM CHLORIDE 0.9 % IV SOLN
INTRAVENOUS | Status: DC
  Administered 2015-09-07 – 2015-09-10 (×4): via INTRAVENOUS

## 2015-09-06 MED ORDER — HEPARIN SOD (PORK) LOCK FLUSH 100 UNIT/ML IV SOLN
500.0000 [IU] | Freq: Every day | INTRAVENOUS | Status: DC | PRN
  Filled 2015-09-06: qty 5

## 2015-09-06 MED ORDER — SODIUM CHLORIDE 0.9 % IJ SOLN: 3.0000 mL | INTRAMUSCULAR | Status: DC | PRN

## 2015-09-06 MED ORDER — IPRATROPIUM BROMIDE 0.02 % IN SOLN: 0.5000 mg | Freq: Four times a day (QID) | RESPIRATORY_TRACT | Status: DC

## 2015-09-06 MED ORDER — DEXTROSE 5 % IV SOLN
2.0000 g | INTRAVENOUS | Status: AC
  Administered 2015-09-06: 2 g via INTRAVENOUS
  Filled 2015-09-06: qty 2

## 2015-09-06 MED ORDER — METHYLPREDNISOLONE SODIUM SUCC 125 MG IJ SOLR
80.0000 mg | Freq: Once | INTRAMUSCULAR | Status: AC
  Administered 2015-09-06: 80 mg via INTRAVENOUS

## 2015-09-06 MED ORDER — POTASSIUM CHLORIDE CRYS ER 20 MEQ PO TBCR
20.0000 meq | EXTENDED_RELEASE_TABLET | Freq: Every day | ORAL | Status: DC
  Administered 2015-09-06 – 2015-09-11 (×6): 20 meq via ORAL
  Filled 2015-09-06 (×7): qty 1

## 2015-09-06 MED ORDER — FAMOTIDINE IN NACL 20-0.9 MG/50ML-% IV SOLN
20.0000 mg | Freq: Two times a day (BID) | INTRAVENOUS | Status: AC
  Administered 2015-09-06: 20 mg via INTRAVENOUS

## 2015-09-06 MED ORDER — ADULT MULTIVITAMIN W/MINERALS CH
1.0000 | ORAL_TABLET | Freq: Every morning | ORAL | Status: DC
  Administered 2015-09-07 – 2015-09-11 (×5): 1 via ORAL
  Filled 2015-09-06 (×7): qty 1

## 2015-09-06 MED ORDER — HEPARIN SOD (PORK) LOCK FLUSH 100 UNIT/ML IV SOLN
250.0000 [IU] | INTRAVENOUS | Status: DC | PRN
  Filled 2015-09-06: qty 3

## 2015-09-06 MED ORDER — RISAQUAD PO CAPS
ORAL_CAPSULE | Freq: Every morning | ORAL | Status: DC
  Administered 2015-09-07 – 2015-09-10 (×4): 1 via ORAL
  Filled 2015-09-06 (×5): qty 1

## 2015-09-06 MED ORDER — LORATADINE 10 MG PO TABS
10.0000 mg | ORAL_TABLET | Freq: Every day | ORAL | Status: DC
  Administered 2015-09-06 – 2015-09-11 (×6): 10 mg via ORAL
  Filled 2015-09-06 (×6): qty 1

## 2015-09-06 MED ORDER — IRBESARTAN 300 MG PO TABS
300.0000 mg | ORAL_TABLET | Freq: Every day | ORAL | Status: DC
  Administered 2015-09-07 – 2015-09-11 (×5): 300 mg via ORAL
  Filled 2015-09-06 (×5): qty 1

## 2015-09-06 MED ORDER — METHYLPREDNISOLONE SODIUM SUCC 125 MG IJ SOLR
INTRAMUSCULAR | Status: AC
  Filled 2015-09-06: qty 2

## 2015-09-06 MED ORDER — ELTROMBOPAG OLAMINE 50 MG PO TABS: 50.0000 mg | ORAL_TABLET | Freq: Every day | ORAL | Status: DC

## 2015-09-06 MED ORDER — IPRATROPIUM-ALBUTEROL 0.5-2.5 (3) MG/3ML IN SOLN
3.0000 mL | Freq: Two times a day (BID) | RESPIRATORY_TRACT | Status: DC
  Administered 2015-09-07 – 2015-09-09 (×6): 3 mL via RESPIRATORY_TRACT
  Filled 2015-09-06 (×7): qty 3

## 2015-09-06 MED ORDER — SODIUM CHLORIDE 0.9 % IJ SOLN: 10.0000 mL | INTRAMUSCULAR | Status: DC | PRN

## 2015-09-06 MED ORDER — SODIUM CHLORIDE 0.9 % IV SOLN: Freq: Once | INTRAVENOUS | Status: AC

## 2015-09-06 NOTE — Progress Notes (Signed)
ANTIBIOTIC CONSULT NOTE - INITIAL  Pharmacy Consult for Vancomycin Indication: HCAP  No Known Allergies  Patient Measurements: Height: 5\' 8"  (172.7 cm) Weight: 192 lb 14.4 oz (87.5 kg) IBW/kg (Calculated) : 68.4 Adjusted Body Weight:   Vital Signs: Temp: 99.4 F (37.4 C) (10/12 1636) Temp Source: Oral (10/12 1636) BP: 132/73 mmHg (10/12 1636) Pulse Rate: 99 (10/12 1636) Intake/Output from previous day:   Intake/Output from this shift:    Labs:  Recent Labs  09/04/15 1039  WBC 14.7*  HGB 7.3*  PLT 103*   Estimated Creatinine Clearance: 67.8 mL/min (by C-G formula based on Cr of 1.23). No results for input(s): VANCOTROUGH, VANCOPEAK, VANCORANDOM, GENTTROUGH, GENTPEAK, GENTRANDOM, TOBRATROUGH, TOBRAPEAK, TOBRARND, AMIKACINPEAK, AMIKACINTROU, AMIKACIN in the last 72 hours.   Microbiology: No results found for this or any previous visit (from the past 720 hour(s)).  Medical History: Past Medical History  Diagnosis Date  . Seasonal allergies   . Hypertension   . Hyperlipidemia   . Hx of colonic polyps 2007  . Gout 04/27/2011    One episode of joint pain at [facility]   Presumed    . Abnormal LFTs 04/27/2011  . Thrombocytopenia ([place]) 04/15/2014    admitted for IVIG  . GERD (gastroesophageal reflux disease)     "mild"  . Osteoarthritis   . Arthritis     "knees" (04/16/2014)  . 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 Lower leg edema   . Anxiety   . Depression   . Urinary frequency   . H/O splenectomy     Assessment: 51 yoM with hx refractory ITP s/p splenectomy on daily promacta presents with progressive, worsening RLL PNA despite course of oral omnicef.  Pharmacy consulted to start Vancomycin for HCAP and MD dosing cefepime.   Anti-infectives 10/12 >> Vancomycin  >> 10/12 >> Cefepime >>    Vitals/Labs WBC: Elevated, 23.2K Tm24h: 99.8 Renal: SCr 1.03, CrCl 81 ml/min (N76)  Cultures None ordered  Goal of Therapy:  Vancomycin trough level 15-20  mcg/ml  Eradication of infection  Plan:  Vancomycin 1500mg  IV x1, then 1g IV q12h Cefepime 2g IV q12h per MD F/u renal function, VT at [facility] as warranted, clinical course  Ralene Bathe, PharmD, BCPS 09/06/2015, 7:19 PM  Pager: [phone]

## 2015-09-06 NOTE — Progress Notes (Signed)
Patient came in for blood transfusion. Temp 100.5. Spoke to Dr [name] and he ordered pre-meds which were administered. Thirty minutes after pre-meds given, temp taken again and patient was 101.3. Spoke to Dr [name] again, and a chest xray was ordered along with an appointment made for the Nurse Practitioner to physically assess him. Patient went down to radiology and then was seen by Laverna Peace NP. At 1300 temp was rechecked - 100.0 - and order was received from [facility] NP to proceed with blood transfusion. Patient is also going to be admitted to the hospital this afternoon for continued problems with pneumonia. We will transfuse one unit in the office, and second unit can be given as an inpatient. Report given to Sherlynn Stalls on 5E at [facility].

## 2015-09-06 NOTE — H&P (Signed)
Referral MD  Reason for Referral: progressive right lower lobe pneumonia; refractory ITP  No chief complaint on file. : I just do not feel well.  HPI: Scott Waters is well-known to me.He is a 59 year old African [place] male. He has refractory ITP. He is on Promacta. He did get IVIG last week because of thrombocytopenia.  He was found to have pneumonia in the right lower lung. This was on a chest x-ray he had done recently. We gave him a dose of IV antibiotic the office and put on some oral Omnicef.  He is in today gettinga blood transfusion. His hemoglobin was 7.3. I think this probably was some dilutional effect from the IVIG. However, he was not feeling well. He had a temperature of 101. He had a chest x-ray which showed worsening of the pneumonia.  As such, he was brought into the hospital for IV antibiotics  .He is not coughing much. He's had no shakes or rigors.  He's had no bleeding. After the IVIG that he got, his platelet count was up to 105,000.  He's had no chest wall pain. He's had no mouth ulcers. He's had no thrush. He's had no diarrhea. There's not been any urinary difficulties.  Again, he is being admitted because his pneumonia is worse and he has some degree of immunocompromised status.he has had a spleen taken out which does compromise his immune state.      Past Medical History  Diagnosis Date  . Seasonal allergies   . Hypertension   . Hyperlipidemia   . Hx of colonic polyps 2007  . Gout 04/27/2011    One episode of joint pain at [facility]   Presumed    . Abnormal LFTs 04/27/2011  . Thrombocytopenia ([place]) 04/15/2014    admitted for IVIG  . GERD (gastroesophageal reflux disease)     "mild"  . Osteoarthritis   . Arthritis     "knees" (04/16/2014)  . 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 Lower leg edema   . Anxiety   . Depression   . Urinary frequency   . H/O splenectomy   :  Past Surgical History  Procedure Laterality Date  . Polypectomy    .  Multiple tooth extractions  2014    "took out all the top teeth"  . Tonsillectomy and adenoidectomy  1973  . Laceration repair Right ~ 1983    "inner knee; steel tubing fell on me"  . Shoulder arthroscopy Left 08/2001    "removed bone spur"  . Laparoscopic splenectomy N/A 11/08/2014    Procedure: LAPAROSCOPIC SPLENECTOMY;  Surgeon: Scott Skates, MD;  Location: [facility];  Service: General;  Laterality: N/A;  . Laparotomy N/A 11/14/2014    Procedure: EXPLORATORY LAPAROTOMY, RELEASE SMALL BOWEL OBSTRUCTION, INCISIONAL HERNIA REPAIR;  Surgeon: Scott Skates, MD;  Location: [facility];  Service: General;  Laterality: N/A;  :   Current facility-administered medications:  .  0.9 %  sodium chloride infusion, , Intravenous, Once, Scott Bottom, NP .  0.9 %  sodium chloride infusion, , Intravenous, Continuous, Scott Humbles [place], NP .  0.9 %  sodium chloride infusion, 250 mL, Intravenous, Once, Scott Napoleon, MD .  albuterol (PROVENTIL) (2.5 MG/3ML) 0.083% nebulizer solution 2.5 mg, 2.5 mg, Nebulization, Q6H, Scott Humbles [place], NP .  ceFEPIme (MAXIPIME) 2 g in dextrose 5 % 50 mL IVPB, 2 g, Intravenous, Q12H, Scott Humbles [place], NP .  enoxaparin (LOVENOX) injection 40 mg, 40 mg, Subcutaneous, Q24H, Scott Bottom, NP .  heparin lock flush 100 unit/mL, 500 Units, Intracatheter, Daily PRN, Scott Napoleon, MD .  heparin lock flush 100 unit/mL, 250 Units, Intracatheter, PRN, Scott Napoleon, MD .  heparin lock flush 100 unit/mL, 500 Units, Intracatheter, Daily PRN, Scott Napoleon, MD .  heparin lock flush 100 unit/mL, 250 Units, Intracatheter, PRN, Scott Napoleon, MD .  ipratropium (ATROVENT) nebulizer solution 0.5 mg, 0.5 mg, Nebulization, Q6H, Scott Humbles [place], NP .  sodium chloride 0.9 % injection 10 mL, 10 mL, Intracatheter, PRN, Scott Napoleon, MD .  sodium chloride 0.9 % injection 10 mL, 10 mL, Intracatheter, PRN, Scott Napoleon, MD .  sodium chloride 0.9 % injection 3 mL, 3 mL,  Intracatheter, PRN, Scott Napoleon, MD .  sodium chloride 0.9 % injection 3 mL, 3 mL, Intracatheter, PRN, Scott Napoleon, MD  Facility-Administered Medications Ordered in Other Encounters:  .  0.9 %  sodium chloride infusion, , Intravenous, Once, Scott Skates, MD .  0.9 %  sodium chloride infusion, , Intravenous, Once, Scott Skates, MD .  famotidine (PEPCID) IVPB 20 mg premix, 20 mg, Intravenous, Q12H, Scott Napoleon, MD, 20 mg at 09/06/15 1136:  . sodium chloride   Intravenous Once  . sodium chloride  250 mL Intravenous Once  . albuterol  2.5 mg Nebulization Q6H  . ceFEPIme (MAXIPIME) 2 GM IVP  2 g Intravenous Q12H  . enoxaparin (LOVENOX) injection  40 mg Subcutaneous Q24H  . ipratropium  0.5 mg Nebulization Q6H  :  No Known Allergies:  Family History  Problem Relation Age of Onset  . Colon cancer Father 73    Died at 49  . Hypertension Mother   . Diabetes Mother   . Kidney disease Mother     diailysis from dm   . Stroke    :  Social History   Social History  . Marital Status: Divorced    Spouse Name: N/A  . Number of Children: N/A  . Years of Education: N/A   Occupational History  . Not on file.   Social History Main Topics  . Smoking status: Former Smoker -- 1.00 packs/day for 33 years    Types: Cigarettes    Start date: 10/02/1970    Quit date: 01/02/2003  . Smokeless tobacco: Former Systems developer    Types: Snuff, Sarina Ser    Quit date: 10/03/2003     Comment: quit 11 years ago  . Alcohol Use: 16.8 oz/week    14 Cans of beer, 14 Shots of liquor per week     Comment: 2 beers daily, 2 shots liquor daily   . Drug Use: Yes     Comment: "last drug use was in ~ 1995; marijuana"  . Sexual Activity: Yes   Other Topics Concern  . 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  Exam: Patient Vitals for the past 24 hrs:  BP Temp Temp src Pulse Resp SpO2 Height Weight  09/06/15 1636 132/73 mmHg 99.4 F (37.4 C) Oral 99 20 96 % 5\' 8"  (1.727 m) 192 lb 14.4 oz (87.5 kg)   Chronically ill appearing African-American male. His vital signs are above. His lungs show some decreased over  the right side. Left side is clear. He does have some wheezing bilaterally. Cardiac exam tachycardic but regular. He has no murmurs rubs or bruits. Abdomen is soft. He has good bowel sounds. There is no fluid wave. There is no palpable hepatomegaly. His spleen is removed. Extremities shows no clubbing, cyanosis or edema. Back exam shows no tenderness over the spine ribs or hips. Skin exam shows no ecchymoses. There is no rashes. Neurological exam is nonfocal.  Recent Labs  09/04/15 1039  WBC 14.7*  HGB 7.3*  HCT 23.8*  PLT 103*   No results for input(s): NA, K, CL, CO2, GLUCOSE, BUN, CREATININE, CALCIUM in the last 72 hours.  Blood smear review:  none  Pathology: none    Assessment and Plan:  Mr. Tietze is a 59 year old African-American male. He has had a splenectomy. He now has pneumonia. So far, we've not had any obvious cultures. I may want to culture his blood.  We will give start on some IV antibiotics. [name] put him on Maxipime and vancomycin. I think this would be reasonable.  We will follow his blood work. We will see how his platelet count is. For now, I would have him on Lovenox for DVT prophylaxis.  He really has just had atough time.  He recently was in the hospital. He was discharged.  We will start him on some IV fluids.  For now, he is a full code. I think this is very reasonable.  Scott Waters

## 2015-09-06 NOTE — Patient Instructions (Signed)

## 2015-09-06 NOTE — Progress Notes (Signed)
Hematology and Oncology Follow Up Visit  Scott Waters [id number] 03-10-1956 59 y.o. 09/06/2015   Principle Diagnosis:  Refractory immune thrombocytopenia - s/p splenectomy   Current Therapy:   Promacta 50 mg Po daily     Interim History:  Scott Waters is here today for blood and is c/o fatigue, dry cough, and fever. His temp at this time 100.0. He was diagnosed with pneumonia last week and has finished his Omnicef prescription. We did a repeat chest xray on him which showed a worsening pneumonia despite antibiotics.  He has had no n/v, rash, dizziness, palpitations, abdominal pain, changes in bowel or bladder habits. No swelling or tenderness in his extremities.  He has responded nicely to the Promacta. His platelet count is now up to 103.  He has not been eating or drinking well. He has SOB with any exertion. His O2 sat on room air is 97%.  He is mildly tachycardic at 108.   Medications:    Medication List       This list is accurate as of: 09/06/15  2:35 PM.  Always use your most recent med list.               AZOR 10-40 MG tablet  Generic drug:  amLODipine-olmesartan  Take 1 tablet by mouth every morning.     cefdinir 300 MG capsule  Commonly known as:  OMNICEF  Take 2 capsules (600 mg total) by mouth 2 (two) times daily.     CINNAMON PO  Take 1,000 mg by mouth every morning.     eltrombopag 50 MG tablet  Commonly known as:  PROMACTA  Take 1 tablet (50 mg total) by mouth daily. Take on an empty stomach 1 hour before a meal or 2 hours after     fexofenadine 180 MG tablet  Commonly known as:  ALLEGRA  Take 180 mg by mouth every morning.     fluticasone 50 MCG/ACT nasal spray  Commonly known as:  FLONASE  instill 2 sprays into each nostril once daily     folic acid 1 MG tablet  Commonly known as:  FOLVITE  Take 1 tablet (1 mg total) by mouth daily.     multivitamin with minerals Tabs tablet  Take 1 tablet by mouth every morning.     pantoprazole 40 MG  tablet  Commonly known as:  PROTONIX  Take 1 tablet (40 mg total) by mouth daily.     potassium chloride 10 MEQ tablet  Commonly known as:  K-DUR,KLOR-CON  take 2 tablets by mouth once daily     PROBIOTIC DAILY PO  Take 1 tablet by mouth every morning.     simvastatin 40 MG tablet  Commonly known as:  ZOCOR     VITAMIN K PO  Take 100 mcg by mouth every morning. OTC med        Allergies: No Known Allergies  Past Medical History, Surgical history, Social history, and Family History were reviewed and updated.  Review of Systems: All other 10 point review of systems is negative.   Physical Exam:  vitals were not taken for this visit.  Wt Readings from Last 3 Encounters:  08/22/15 194 lb (87.998 kg)  08/13/15 192 lb 14.4 oz (87.5 kg)  08/02/15 192 lb (87.091 kg)    Ocular: Sclerae unicteric, pupils equal, round and reactive to light Ear-nose-throat: Oropharynx clear, dentition fair Lymphatic: No cervical or supraclavicular adenopathy Lungs no rales or rhonchi, good excursion bilaterally Heart regular rate and  rhythm, no murmur appreciated Abd soft, nontender, positive bowel sounds MSK no focal spinal tenderness, no joint edema Neuro: non-focal, well-oriented, appropriate affect Breasts: Deferred  Lab Results  Component Value Date   WBC 14.7* 09/04/2015   HGB 7.3* 09/04/2015   HCT 23.8* 09/04/2015   MCV 94 09/04/2015   PLT 103* 09/04/2015   No results found for: FERRITIN, IRON, TIBC, UIBC, IRONPCTSAT Lab Results  Component Value Date   RETICCTPCT 2.6* 05/13/2014   RBC 2.52* 09/04/2015   RETICCTABS 91.0 05/13/2014   No results found for: KPAFRELGTCHN, LAMBDASER, KAPLAMBRATIO No results found for: IGGSERUM, IGA, IGMSERUM No results found for: Odetta Pink, SPEI   Chemistry      Component Value Date/Time   NA 137 08/18/2015 0951   NA 139 07/20/2014 0902   K 3.2* 08/18/2015 0951   K 3.3 07/20/2014 0902     CL 110 08/18/2015 0951   CL 103 07/20/2014 0902   CO2 22 08/18/2015 0951   CO2 27 07/20/2014 0902   BUN 28* 08/18/2015 0951   BUN 11 07/20/2014 0902   CREATININE 1.23 08/18/2015 0951   CREATININE 0.9 07/20/2014 0902      Component Value Date/Time   CALCIUM 8.0* 08/18/2015 0951   CALCIUM 8.6 07/20/2014 0902   ALKPHOS 51 08/16/2015 0537   ALKPHOS 75 07/20/2014 0902   AST 30 08/16/2015 0537   AST 51* 07/20/2014 0902   ALT 28 08/16/2015 0537   ALT 36 07/20/2014 0902   BILITOT 0.6 08/16/2015 0537   BILITOT 0.80 07/20/2014 0902     Impression and Plan: Scott Waters is a pleasant 59 yo gentleman with refractory immune thrombocytopenia. He is here today for anemia and a blood transfusion. He is febrile with a cough, SOB, weakness and fatigue.  Chest xray today confirmed a worsening right lower lobe pneumonia despite completing a course of oral antibiotics. After speaking with Dr. [name] the decision was made to admit him to [facility] this afternoon for closer evaluation and treatment with IV antibiotics and fluids.   We will give 1 units of blood while he is here and then he will get the second once he is admitted.  Admission orders are in for RN to release once he arrives.  Dr. [name] will follow-up with him in the hospital.       Eliezer Bottom, NP 10/12/20162:35 PM

## 2015-09-06 NOTE — Telephone Encounter (Signed)
Bed requested for admission for pt. Per Hilliard Clark, pt can go to admitting when ready. Baxter Flattery, financial counselor aware. dph

## 2015-09-07 ENCOUNTER — Inpatient Hospital Stay (HOSPITAL_COMMUNITY): Payer: BLUE CROSS/BLUE SHIELD

## 2015-09-07 DIAGNOSIS — D72829 Elevated white blood cell count, unspecified: Secondary | ICD-10-CM

## 2015-09-07 LAB — TYPE AND SCREEN
ABO/RH(D): A POS
ANTIBODY SCREEN: NEGATIVE
UNIT DIVISION: 0
Unit division: 0
Unit division: 0

## 2015-09-07 LAB — CBC
HEMATOCRIT: 26.6 % — AB (ref 39.0–52.0)
HEMOGLOBIN: 8.5 g/dL — AB (ref 13.0–17.0)
MCH: 28 pg (ref 26.0–34.0)
MCHC: 32 g/dL (ref 30.0–36.0)
MCV: 87.5 fL (ref 78.0–100.0)
Platelets: 87 10*3/uL — ABNORMAL LOW (ref 150–400)
RBC: 3.04 MIL/uL — ABNORMAL LOW (ref 4.22–5.81)
RDW: 17.1 % — ABNORMAL HIGH (ref 11.5–15.5)
WBC: 25.6 10*3/uL — AB (ref 4.0–10.5)

## 2015-09-07 LAB — COMPREHENSIVE METABOLIC PANEL
ALBUMIN: 2.3 g/dL — AB (ref 3.5–5.0)
ALK PHOS: 69 U/L (ref 38–126)
ALT: 47 U/L (ref 17–63)
AST: 38 U/L (ref 15–41)
Anion gap: 6 (ref 5–15)
BILIRUBIN TOTAL: 0.5 mg/dL (ref 0.3–1.2)
BUN: 17 mg/dL (ref 6–20)
CALCIUM: 7.9 mg/dL — AB (ref 8.9–10.3)
CO2: 21 mmol/L — ABNORMAL LOW (ref 22–32)
CREATININE: 0.93 mg/dL (ref 0.61–1.24)
Chloride: 106 mmol/L (ref 101–111)
GFR calc Af Amer: >60 mL/min (ref >60)
GFR calc non Af Amer: >60 mL/min (ref >60)
GLUCOSE: 155 mg/dL — AB (ref 65–99)
Potassium: 3.6 mmol/L (ref 3.5–5.1)
Sodium: 133 mmol/L — ABNORMAL LOW (ref 135–145)
TOTAL PROTEIN: 7.3 g/dL (ref 6.5–8.1)

## 2015-09-07 MED ORDER — ELTROMBOPAG OLAMINE 50 MG PO TABS: 50.0000 mg | ORAL_TABLET | Freq: Every day | ORAL | Status: DC

## 2015-09-07 MED ORDER — VANCOMYCIN HCL IN DEXTROSE 1-5 GM/200ML-% IV SOLN
1000.0000 mg | Freq: Two times a day (BID) | INTRAVENOUS | Status: DC
  Administered 2015-09-07: 1000 mg via INTRAVENOUS
  Filled 2015-09-07: qty 200

## 2015-09-07 MED ORDER — GUAIFENESIN-DM 100-10 MG/5ML PO SYRP
5.0000 mL | ORAL_SOLUTION | ORAL | Status: DC | PRN
  Administered 2015-09-07: 5 mL via ORAL
  Filled 2015-09-07: qty 10

## 2015-09-07 MED ORDER — DEXTROSE 5 % IV SOLN
2.0000 g | Freq: Three times a day (TID) | INTRAVENOUS | Status: DC
  Administered 2015-09-07 – 2015-09-11 (×12): 2 g via INTRAVENOUS
  Filled 2015-09-07 (×13): qty 2

## 2015-09-07 MED ORDER — VANCOMYCIN HCL 10 G IV SOLR
1250.0000 mg | Freq: Two times a day (BID) | INTRAVENOUS | Status: DC
  Administered 2015-09-07 – 2015-09-09 (×4): 1250 mg via INTRAVENOUS
  Filled 2015-09-07 (×4): qty 1250

## 2015-09-07 MED ORDER — ELTROMBOPAG OLAMINE 50 MG PO TABS
50.0000 mg | ORAL_TABLET | Freq: Every day | ORAL | Status: DC
  Administered 2015-09-07 – 2015-09-10 (×4): 50 mg via ORAL

## 2015-09-07 NOTE — Progress Notes (Signed)
Mr. Kazee is feeling better area and he did have a temperature last night.  He did receive 2 units of blood yesterday.  He now is on IV antibiotics with vancomycin and Maxipime. We will check a chest x-ray on him tomorrow.  He is getting nebulizer therapy. He is coughing up a little bit. He's had no hemoptysis.  His been no nausea or vomiting.  On his physical exam, his vital signs are stable. Temperature 98.7. Pulse 77. Blood pressure 117/71. Oxygen saturation is 97%. His lungs sound better. He has some slight decrease over in the right base. He has some scattered wheezes. Cardiac exam regular in rhythm with no murmurs, rubs or bruits. Abdomen is soft. He has good bowel sounds. He has well-healed splenotomy scar. There is no palpable hepatomegaly. Extremities shows no clubbing, cyanosis or edema. Skin exam shows no scattered ecchymoses. He has no petechia. Neurological exam is nonfocal.  His white cell count is 25.6. Hemoglobin 8.5. Platelet count 87,000. BUN 17 creatinine 0.9. Sodium 133 potassium 3.6.  I think the leukocytosis, is in part, from the steroids that he got yesterday in the office. These are given as premeds for the blood.  We will see what his labs look like tomorrow.  I will do a chest x-ray on him tomorrow. [name] also want to see if he has a pleural effusion. If he does, though it may have to consider draining this.  He does look better. I think the blood did help him out.  I appreciate the great care that he is getting from everybody up on 5 E.  [place] 3:22-23

## 2015-09-07 NOTE — Care Management Note (Signed)
Case Management Note  Patient Details  Name: Scott Waters MRN: [id number] Date of Birth: 08-26-1956  Subjective/Objective:           Admitted with pna, failed OP treatment         Action/Plan: Discharge planning  Expected Discharge Date:   (unknown)               Expected Discharge Plan:  Home/Self Care  In-House Referral:  NA  Discharge planning Services  CM Consult  Post Acute Care Choice:  NA Choice offered to:  NA  DME Arranged:  N/A DME Agency:  NA  HH Arranged:  NA HH Agency:  NA  Status of Service:  In process, will continue to follow  Medicare Important Message Given:    Date Medicare IM Given:    Medicare IM give by:    Date Additional Medicare IM Given:    Additional Medicare Important Message give by:     If discussed at [place] of Stay Meetings, dates discussed:    Additional Comments:  Guadalupe Maple, RN 09/07/2015, 12:01 PM

## 2015-09-07 NOTE — Progress Notes (Addendum)
ANTIBIOTIC CONSULT NOTE  Pharmacy Consult for Vancomycin, Ceftazidime Indication: Pneumonia  No Known Allergies  Patient Measurements: Height: 5\' 8"  (172.7 cm) Weight: 193 lb 9 oz (87.8 kg) IBW/kg (Calculated) : 68.4  Vital Signs: Temp: 98.2 F (36.8 C) (10/13 0900) Temp Source: Oral (10/13 0900) BP: 115/66 mmHg (10/13 0900) Pulse Rate: 92 (10/13 0900) Intake/Output from previous day: 10/12 0701 - 10/13 0700 In: 872.5 [P.O.:240; I.V.:250; Blood:382.5] Out: -  Intake/Output from this shift:    Labs:  Recent Labs  09/06/15 1812 09/07/15 0550  WBC 23.2* 25.6*  HGB 9.4* 8.5*  PLT 88* 87*  CREATININE 1.03 0.93   Estimated Creatinine Clearance: 89.9 mL/min (by C-G formula based on Cr of 0.93). No results for input(s): VANCOTROUGH, VANCOPEAK, VANCORANDOM, GENTTROUGH, GENTPEAK, GENTRANDOM, TOBRATROUGH, TOBRAPEAK, TOBRARND, AMIKACINPEAK, AMIKACINTROU, AMIKACIN in the last 72 hours.   Microbiology: No results found for this or any previous visit (from the past 720 hour(s)).  Medical History: Past Medical History  Diagnosis Date  . Seasonal allergies   . Hypertension   . Hyperlipidemia   . Hx of colonic polyps 2007  . Gout 04/27/2011    One episode of joint pain at [facility]   Presumed    . Abnormal LFTs 04/27/2011  . Thrombocytopenia ([place]) 04/15/2014    admitted for IVIG  . GERD (gastroesophageal reflux disease)     "mild"  . Osteoarthritis   . Arthritis     "knees" (04/16/2014)  . 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 Lower leg edema   . Anxiety   . Depression   . Urinary frequency   . H/O splenectomy     Assessment: 59 yoM with hx refractory ITP s/p splenectomy on daily promacta presents with progressive, worsening RLL PNA despite course of oral omnicef.  Pharmacy consulted to start Vancomycin for HCAP and MD dosing cefepime. MD ok'ed to change Cefepime to Ceftazidime per pharmacy dosing today due to national shortage of Cefepime and patient not  neutropenic.  Anti-infectives 10/12 >> Vancomycin  >> 10/12 >> Cefepime >> 10/13 10/13 >> Ceftazidime >>  Vitals/Labs WBC: Elevated and trending up (did receive Solu-medrol x 1 on 10/12) Tm24h: 99.8 Renal: SCr 0.93 with CrCl ~ 90 ml/min CG  Cultures None ordered  Goal of Therapy:  Vancomycin trough level 15-20 mcg/ml  Appropriate antibiotic dosing for renal function and indication Eradication of infection  Plan:   Change Vancomycin to 1250 mg IV q12h  Plan for Vancomycin trough level at steady state.  Ceftazidime 2g IV q8h  Monitor renal function, clinical course.     Lindell Spar, PharmD, BCPS Pager: [phone] 09/07/2015 1:09 PM

## 2015-09-08 ENCOUNTER — Inpatient Hospital Stay (HOSPITAL_COMMUNITY): Payer: BLUE CROSS/BLUE SHIELD

## 2015-09-08 ENCOUNTER — Ambulatory Visit: Payer: BLUE CROSS/BLUE SHIELD | Admitting: Internal Medicine

## 2015-09-08 DIAGNOSIS — Z7901 Long term (current) use of anticoagulants: Secondary | ICD-10-CM

## 2015-09-08 LAB — CBC
HCT: 28 % — ABNORMAL LOW (ref 39.0–52.0)
HEMOGLOBIN: 8.9 g/dL — AB (ref 13.0–17.0)
MCH: 27.8 pg (ref 26.0–34.0)
MCHC: 31.8 g/dL (ref 30.0–36.0)
MCV: 87.5 fL (ref 78.0–100.0)
Platelets: 84 10*3/uL — ABNORMAL LOW (ref 150–400)
RBC: 3.2 MIL/uL — AB (ref 4.22–5.81)
RDW: 17.3 % — ABNORMAL HIGH (ref 11.5–15.5)
WBC: 24.5 10*3/uL — ABNORMAL HIGH (ref 4.0–10.5)

## 2015-09-08 MED ORDER — KETOROLAC TROMETHAMINE 15 MG/ML IJ SOLN
30.0000 mg | Freq: Once | INTRAMUSCULAR | Status: AC
  Administered 2015-09-08: 30 mg via INTRAVENOUS
  Filled 2015-09-08: qty 2

## 2015-09-08 MED ORDER — HYDROCODONE-HOMATROPINE 5-1.5 MG/5ML PO SYRP: 5.0000 mL | ORAL_SOLUTION | Freq: Four times a day (QID) | ORAL | Status: DC | PRN

## 2015-09-08 NOTE — Progress Notes (Signed)
[name] is having some slight discomfort over the right side of his chest. He has a little bit of cough. He had a chest x-ray done yesterday. It showed a nice improvement in the pneumonia. There is still some pleural effusion there. I may repeat the x-ray over the weekend.  I will give him an incentive spirometer to try to help.  No labs are back yet today.  While he is in the hospital, may consider another round of IVIG for him. This may make sense.  He may have some pleurisy. [name] give him a dose of Toradol to try to help.  He's had no bleeding. He's had no nausea or vomiting. There's been no diarrhea.  He is on Lovenox. I may put him on compression boots as I expect that his blood count probably is dropping.  He has had no fever.  On his physical exam, his vital signs show temperature of 98.2. Pulse 86. Blood pressure 132/81. His oxygen saturation is 97%. Head and neck exam shows no ocular or oral lesions. He has no thrush. There is no adenopathy. Lungs are with some slight decrease over the right base. No wheezes are noted. He has decent air movement overall. Cardiac exam regular rate and rhythm with no murmurs, rubs or bruits. Abdomen is soft area and has good bowel sounds. There is no fluid wave. Has a laparoscopy scars from his splenectomy. There is no hepatomegaly. Extremities shows no clubbing, cyanosis or edema.  [name] has the right lower lobe pneumonia. Given that he has had a splenectomy, he is at significant risk for the pneumonia to progress. As such, we are treating him with IV antibiotics.  I will repeat the chest x-ray tomorrow. Again, I may want to think about getting some of the fluid taken off. It is hard to say how much fluid is there.  I very much appreciate all the great care that he is getting from the staff upon 5 E.  [name]  [place] 8:17

## 2015-09-08 NOTE — Progress Notes (Signed)
Date: September 08, 2015 No discharge needs present at time of release.  Velva Harman, RN, BSN, [place]   [phone]

## 2015-09-09 ENCOUNTER — Inpatient Hospital Stay (HOSPITAL_COMMUNITY): Payer: BLUE CROSS/BLUE SHIELD

## 2015-09-09 DIAGNOSIS — J90 Pleural effusion, not elsewhere classified: Secondary | ICD-10-CM

## 2015-09-09 DIAGNOSIS — D649 Anemia, unspecified: Secondary | ICD-10-CM

## 2015-09-09 DIAGNOSIS — I10 Essential (primary) hypertension: Secondary | ICD-10-CM

## 2015-09-09 DIAGNOSIS — Z9081 Acquired absence of spleen: Secondary | ICD-10-CM

## 2015-09-09 LAB — COMPREHENSIVE METABOLIC PANEL
ALBUMIN: 2.2 g/dL — AB (ref 3.5–5.0)
ALK PHOS: 66 U/L (ref 38–126)
ALT: 113 U/L — ABNORMAL HIGH (ref 17–63)
ANION GAP: 7 (ref 5–15)
AST: 101 U/L — ABNORMAL HIGH (ref 15–41)
BILIRUBIN TOTAL: 0.5 mg/dL (ref 0.3–1.2)
BUN: 12 mg/dL (ref 6–20)
CALCIUM: 8.1 mg/dL — AB (ref 8.9–10.3)
CO2: 23 mmol/L (ref 22–32)
Chloride: 107 mmol/L (ref 101–111)
Creatinine, Ser: 0.82 mg/dL (ref 0.61–1.24)
GLUCOSE: 91 mg/dL (ref 65–99)
POTASSIUM: 3.2 mmol/L — AB (ref 3.5–5.1)
Sodium: 137 mmol/L (ref 135–145)
TOTAL PROTEIN: 6.8 g/dL (ref 6.5–8.1)

## 2015-09-09 LAB — GRAM STAIN

## 2015-09-09 LAB — CBC
HEMATOCRIT: 29.4 % — AB (ref 39.0–52.0)
HEMOGLOBIN: 9.1 g/dL — AB (ref 13.0–17.0)
MCH: 27.3 pg (ref 26.0–34.0)
MCHC: 31 g/dL (ref 30.0–36.0)
MCV: 88.3 fL (ref 78.0–100.0)
Platelets: 88 10*3/uL — ABNORMAL LOW (ref 150–400)
RBC: 3.33 MIL/uL — ABNORMAL LOW (ref 4.22–5.81)
RDW: 17.3 % — AB (ref 11.5–15.5)
WBC: 15.8 10*3/uL — ABNORMAL HIGH (ref 4.0–10.5)

## 2015-09-09 LAB — LACTATE DEHYDROGENASE, PLEURAL OR PERITONEAL FLUID: LD, Fluid: 443 U/L — ABNORMAL HIGH (ref 3–23)

## 2015-09-09 LAB — VANCOMYCIN, TROUGH: VANCOMYCIN TR: 20 ug/mL (ref 10.0–20.0)

## 2015-09-09 MED ORDER — OXYCODONE HCL 5 MG PO TABS
5.0000 mg | ORAL_TABLET | ORAL | Status: DC | PRN
  Administered 2015-09-09 – 2015-09-10 (×2): 5 mg via ORAL
  Filled 2015-09-09 (×2): qty 1

## 2015-09-09 MED ORDER — VANCOMYCIN HCL IN DEXTROSE 1-5 GM/200ML-% IV SOLN
1000.0000 mg | Freq: Two times a day (BID) | INTRAVENOUS | Status: DC
  Administered 2015-09-09 – 2015-09-11 (×3): 1000 mg via INTRAVENOUS
  Filled 2015-09-09 (×4): qty 200

## 2015-09-09 NOTE — Progress Notes (Signed)
[name] is doing a little better today. He still has a little bit of a cough. It is productive of clearish sputum.  His chest x-ray  did show a moderate right pleural effusion. I want to have this removed so that he does not develop any type of abscess or the effusion becomes gelatinous that may cause issues.  He's had no fever.  He's had no bleeding.  He's had no diarrhea.  His appetite has been good.  He continues on the IV antibiotics. The actual pneumonia itself, but chest x-ray, appears to be improving.  On his physical exam, he is afebrile. His temperature 98.4. Blood pressure 145/75. Pulse is 79. His lungs show some decreased at the right base. No wheezes are noted bilaterally. He does have good air movement bilaterally. Cardiac exam regular rate and rhythm with no murmurs, rubs or bruits. Abdomen is soft. He has good bowel sounds. There is no fluid wave. There is no palpable hepatomegaly. He has a laparoscopic incisions from his sweats me that are healed. Extremities shows no clubbing, cyanosis or edema. Neurological exam is nonfocal. Skin exam shows no petechia or ecchymoses.  [name] has has refractory ITP. He is on Promacta. His blood count has been holding pretty steady. We will have see what his CBC shows today.  He will be undergoing a right thoracentesis today. Again, I want to have this pleural effusion removed.  I will continue IV antibiotics over the weekend.  Hopefully, if all is good on Monday, we can discharge him.  I'm grateful with a great care that he is getting from everybody up on 5 E.!!!  [name]  [place] 1:9

## 2015-09-09 NOTE — Procedures (Signed)
   US guided Rt thoracentesis 780 cc bloody fluid Sent for labs  cxr pending

## 2015-09-09 NOTE — Progress Notes (Signed)
ANTIBIOTIC CONSULT NOTE  Pharmacy Consult for Vancomycin, Ceftazidime Indication: Pneumonia  No Known Allergies  Patient Measurements: Height: 5\' 8"  (172.7 cm) Weight: 197 lb 8.5 oz (89.6 kg) IBW/kg (Calculated) : 68.4  Vital Signs: Temp: 98.4 F (36.9 C) (10/15 0625) Temp Source: Oral (10/15 0625) BP: 145/75 mmHg (10/15 0625) Pulse Rate: 79 (10/15 0625) Intake/Output from previous day:    Labs:  Recent Labs  09/06/15 1812 09/07/15 0550 09/08/15 0710 09/09/15 0722  WBC 23.2* 25.6* 24.5* 15.8*  HGB 9.4* 8.5* 8.9* 9.1*  PLT 88* 87* 84* 88*  CREATININE 1.03 0.93  --  0.82   Estimated Creatinine Clearance: 102.9 mL/min (by C-G formula based on Cr of 0.82).  Recent Labs  09/09/15 0722  VANCOTROUGH 20     Assessment: 29 yoM with hx refractory ITP s/p splenectomy on daily promacta presents with progressive, worsening RLL PNA despite course of oral omnicef.  Pharmacy consulted to start Vancomycin for HCAP and MD dosing cefepime. MD ok'ed to change Cefepime to Ceftazidime per pharmacy dosing today due to national shortage of Cefepime and patient not neutropenic.  10/12 >> Vancomycin  >> 10/12 >> Cefepime >> 10/13 10/13 >> Ceftazidime >>  Today, 09/09/2015:  Day #4 antibiotics  Afebrile  WBC elevated but improved, 15.8  Renal: SCr 0.82 with CrCl ~ 100 ml/min CG  Vancomycin trough level = 20, therapeutic at upper end of range.  Goal of Therapy:  Vancomycin trough level 15-20 mcg/ml  Appropriate antibiotic dosing for renal function and indication Eradication of infection  Plan:   Continue Ceftazidime 2g IV q8h  Decrease to Vancomycin to 1000 mg IV q12h  Re-check Vancomycin trough level at steady state if continued.  Monitor renal function, clinical course.   Gretta Arab PharmD, BCPS Pager [phone] 09/09/2015 10:10 AM

## 2015-09-10 DIAGNOSIS — D638 Anemia in other chronic diseases classified elsewhere: Secondary | ICD-10-CM

## 2015-09-10 DIAGNOSIS — J188 Other pneumonia, unspecified organism: Secondary | ICD-10-CM

## 2015-09-10 LAB — CBC
HEMATOCRIT: 30.1 % — AB (ref 39.0–52.0)
HEMOGLOBIN: 9.5 g/dL — AB (ref 13.0–17.0)
MCH: 27.9 pg (ref 26.0–34.0)
MCHC: 31.6 g/dL (ref 30.0–36.0)
MCV: 88.3 fL (ref 78.0–100.0)
Platelets: 79 10*3/uL — ABNORMAL LOW (ref 150–400)
RBC: 3.41 MIL/uL — ABNORMAL LOW (ref 4.22–5.81)
RDW: 17.2 % — AB (ref 11.5–15.5)
WBC: 13.8 10*3/uL — AB (ref 4.0–10.5)

## 2015-09-10 MED ORDER — IPRATROPIUM-ALBUTEROL 0.5-2.5 (3) MG/3ML IN SOLN: 3.0000 mL | RESPIRATORY_TRACT | Status: DC | PRN

## 2015-09-10 NOTE — Progress Notes (Signed)
Scott Waters   DOB:Aug 20, 1956   FU#:[id number]    Subjective: Feels well. Last night, he has some chest wall pain, resolved with oxycodone. He denies fevers or chills. No worsening shortness of breath. The patient denies any recent signs or symptoms of bleeding such as spontaneous epistaxis, hematuria or hematochezia.   Objective:  Filed Vitals:   09/10/15 0537  BP: 120/71  Pulse: 89  Temp: 98.8 F (37.1 C)  Resp: 18     Intake/Output Summary (Last 24 hours) at 09/10/15 0757 Last data filed at 09/09/15 2132  Gross per 24 hour  Intake    840 ml  Output      3 ml  Net    837 ml    GENERAL:alert, no distress and comfortable SKIN: skin color, texture, turgor are normal, no rashes or significant lesions EYES: normal, Conjunctiva are pink and non-injected, sclera clear OROPHARYNX:no exudate, no erythema and lips, buccal mucosa, and tongue normal  NECK: supple, thyroid normal size, non-tender, without nodularity LYMPH:  no palpable lymphadenopathy in the cervical, axillary or inguinal LUNGS: clear to auscultation and percussion with normal breathing effort HEART: regular rate & rhythm and no murmurs and no lower extremity edema ABDOMEN:abdomen soft, non-tender and normal bowel sounds Musculoskeletal:no cyanosis of digits and no clubbing  NEURO: alert & oriented x 3 with fluent speech, no focal motor/sensory deficits   Labs:  Lab Results  Component Value Date   WBC 13.8* 09/10/2015   HGB 9.5* 09/10/2015   HCT 30.1* 09/10/2015   MCV 88.3 09/10/2015   PLT 79* 09/10/2015   NEUTROABS 12.1* 09/04/2015    Lab Results  Component Value Date   NA 137 09/09/2015   K 3.2* 09/09/2015   CL 107 09/09/2015   CO2 23 09/09/2015    Studies:  Dg Chest 1 View  09/09/2015  CLINICAL DATA:  Thoracentesis EXAM: CHEST 1 VIEW COMPARISON:  09/08/2015 FINDINGS: No pneumothorax after right thoracentesis. Right pleural effusion improved. Heart remains enlarged. Stable appearance of the left  hemi thorax. IMPRESSION: No pneumothorax after right thoracentesis. Electronically Signed   By: Marybelle Killings M.D.   On: 09/09/2015 11:26   Dg Chest 2 View  09/08/2015  CLINICAL DATA:  Pneumonia, right pleural effusion. EXAM: CHEST  2 VIEW COMPARISON:  September 07, 2015. FINDINGS: Stable cardiomediastinal silhouette. No pneumothorax is noted. Mild central pulmonary vascular congestion is noted. Left lung is otherwise clear. Stable moderate right pleural effusion is noted. Bony thorax is unremarkable. IMPRESSION: Mild central pulmonary vascular congestion. Stable moderate right pleural effusion. Electronically Signed   By: Marijo Conception, M.D.   On: 09/08/2015 08:36   Dg Chest Right Decubitus  09/08/2015  CLINICAL DATA:  Pneumonia.  Evaluate right pleural effusion. EXAM: CHEST - RIGHT DECUBITUS COMPARISON:  Chest radiograph 09/07/2015 FINDINGS: Left lateral decubitus image was obtained. There is layering right pleural fluid. There is no significant left pleural fluid. Cannot exclude consolidation at the right lung base. Heart size is grossly stable. IMPRESSION: Layering right pleural fluid. Electronically Signed   By: Markus Daft M.D.   On: 09/08/2015 08:35   US Thoracentesis Asp Pleural Space W/img Guide  09/09/2015  INDICATION: Symptomatic R sided pleural effusion EXAM: US THORACENTESIS ASP PLEURAL SPACE W/IMG GUIDE COMPARISON:  None. MEDICATIONS: 10 cc 1% lidocaine COMPLICATIONS: None immediate TECHNIQUE: Informed written consent was obtained from the patient after a discussion of the risks, benefits and alternatives to treatment. A timeout was performed prior to the initiation of the procedure. Initial ultrasound  scanning demonstrates a right pleural effusion. The lower chest was prepped and draped in the usual sterile fashion. 1% lidocaine was used for local anesthesia. Under direct ultrasound guidance, a 19 gauge, 7-cm, Yueh catheter was introduced. An ultrasound image was saved for documentation  purposes. The thoracentesis was performed. The catheter was removed and a dressing was applied. The patient tolerated the procedure well without immediate post procedural complication. The patient was escorted to have an upright chest radiograph. FINDINGS: A total of approximately 780 cc of bloody fluid was removed. Requested samples were sent to the laboratory. IMPRESSION: Successful ultrasound-guided R sided thoracentesis yielding 780 cc of pleural fluid. Read by:  Lavonia Drafts [facility] Electronically Signed   By: Marybelle Killings M.D.   On: 09/09/2015 13:44    Assessment & Plan:  Chronic ITP He will continue on Promacta. Platelet count is a little low but he has no signs of bleeding. Continue the same.  Community-acquired pneumonia with parapneumonic effusion He has no complication from thoracentesis apart from some chest wall pain, resolved with oxycodone. Continue IV antibiotics.  Anemia of chronic disease He has no signs of bleeding. He is not symptomatic. Observation only.  DVT prophylaxis Due to risk of bleeding, he is not on Lovenox or heparin. Place TED hose  Discharge planning Hopefully discharge tomorrow.   [place], [place], MD 09/10/2015  7:57 AM

## 2015-09-11 ENCOUNTER — Other Ambulatory Visit: Payer: BLUE CROSS/BLUE SHIELD

## 2015-09-11 ENCOUNTER — Other Ambulatory Visit: Payer: Self-pay | Admitting: Hematology & Oncology

## 2015-09-11 LAB — CBC
HCT: 29.1 % — ABNORMAL LOW (ref 39.0–52.0)
HEMOGLOBIN: 9.2 g/dL — AB (ref 13.0–17.0)
MCH: 27.4 pg (ref 26.0–34.0)
MCHC: 31.6 g/dL (ref 30.0–36.0)
MCV: 86.6 fL (ref 78.0–100.0)
PLATELETS: 59 10*3/uL — AB (ref 150–400)
RBC: 3.36 MIL/uL — ABNORMAL LOW (ref 4.22–5.81)
RDW: 16.7 % — ABNORMAL HIGH (ref 11.5–15.5)
WBC: 13.2 10*3/uL — ABNORMAL HIGH (ref 4.0–10.5)

## 2015-09-11 LAB — ERYTHROPOIETIN: ERYTHROPOIETIN: 42.8 m[IU]/mL — AB (ref 2.6–18.5)

## 2015-09-11 MED ORDER — LEVOFLOXACIN 500 MG PO TABS
500.0000 mg | ORAL_TABLET | Freq: Every day | ORAL | Status: DC
  Administered 2015-09-11: 500 mg via ORAL
  Filled 2015-09-11: qty 1

## 2015-09-11 MED ORDER — LEVOFLOXACIN 500 MG PO TABS: 500.0000 mg | ORAL_TABLET | Freq: Every day | ORAL | Status: DC

## 2015-09-11 NOTE — Discharge Summary (Signed)
Scott Waters, Scott Waters NO.:  [id number]  MEDICAL RECORD NO.:  [id number]  LOCATION:  2426                         FACILITY:  [facility]  PHYSICIAN:  [name], M.D.  DATE OF BIRTH:  05-29-1956  DATE OF ADMISSION:  09/06/2015 DATE OF DISCHARGE:  09/11/2015                              DISCHARGE SUMMARY   DIAGNOSES UPON DISCHARGE: 1. Right lower lobe pneumonia. 2. Right pleural effusion-status post thoracentesis. 3. History of refractory immune thrombocytopenic purpura. 4. History of splenectomy. 5. Hypertension. 6. Chronic anemia.  CONDITION UPON DISCHARGE:  Stable.  ACTIVITIES:  As tolerated.  DIET:  Without restrictions.  FOLLOWUP:  The patient will follow up with Dr. Marin Waters at the [facility] in three days.  MEDICATIONS ON DISCHARGE: 1. Levaquin 500 mg p.o. daily x5 days. 2. Azor (10/40) one p.o. daily. 3. Promacta 50 mg p.o. daily. 4. Allegra 180 mg p.o. daily. 5. Folic acid 1 mg p.o. daily. 6. Multivitamin one p.o. daily. 7. Protonix 40 mg p.o. daily. 8. Potassium chloride 20 mEq p.o. daily. 9. Zocor 40 mg p.o. daily. 10.Probiotic one p.o. b.i.d.  HOSPITAL COURSE:  [name] was admitted from the office with worsening pneumonia.  He is a 59 year old gentleman.  He has refractory immune thrombocytopenic purpura.  He has had a splenectomy.  He was noted to have pneumonia previously.  He was on oral antibiotics. However, he was having some chest discomfort.  He had temperatures.  It appeared that his ammonia was worse on chest x-ray.  He subsequently was admitted.  We started IV antibiotics on him.  He received Tressie Ellis with vancomycin.  Cultures were taken which were negative.  He was found to have a pleural effusion.  He subsequently underwent an ultrasound-guided thoracentesis.  This was done on the 15th.  A 780 mL of fluid was removed.  He felt better.  Culture results are pending.  He was afebrile during the hospital  stay.  His platelet count maintained itself pretty well.  He had a CBC done upon discharge which showed a white cell count 13.2, hemoglobin 9.2, and platelet count 59,000.  His electrolytes showed a potassium of 3.2.  He is on supplemental potassium.  His creatinine was 0.82.  He did have some slightly elevated LFTs.  However, he was asymptomatic.  He had no nausea or vomiting.  He was receiving some nebulizer treatments.  This helped a little bit. He was using an incentive spirometer.  He did not have any issues with diarrhea.  He has some urinary frequency without burning.  He is ready for discharge in the morning of the 17th.  Upon discharge, his vital signs showed temperature of 98.2.  Pulse 88. Blood pressure 140/75.  Oxygen saturation on room air was 96%.  His head and neck exam showed no ocular or oral lesions.  He had no palpable cervical or supraclavicular lymph nodes.  Lungs are clear bilaterally. There may be some slight decrease at the right base.  Cardiac exam with regular rate and rhythm with a normal S1 and S2.  There are no murmurs, rubs, or bruits.  Abdomen is soft.  He had good bowel sounds.  There  is no fluid wave.  There is no palpable hepatosplenomegaly.  Extremities show no clubbing, cyanosis, or edema.  Skin exam showed no rashes, ecchymosis, or petechia.     [name], M.D.     PRE/MEDQ  D:  09/11/2015  T:  09/11/2015  Job:  [id number]

## 2015-09-11 NOTE — Discharge Summary (Signed)
#   [id number] is the d/c summary  Scott Waters

## 2015-09-12 ENCOUNTER — Encounter: Payer: Self-pay | Admitting: *Deleted

## 2015-09-13 ENCOUNTER — Telehealth: Payer: Self-pay | Admitting: *Deleted

## 2015-09-13 NOTE — Telephone Encounter (Signed)
Spoke with Alyse Low at [employer] [facility]. Information about diagnosis, treatment and medications provided.   [phone] ext [phone]

## 2015-09-14 ENCOUNTER — Encounter: Payer: Self-pay | Admitting: Family

## 2015-09-14 ENCOUNTER — Other Ambulatory Visit (HOSPITAL_BASED_OUTPATIENT_CLINIC_OR_DEPARTMENT_OTHER): Payer: BLUE CROSS/BLUE SHIELD

## 2015-09-14 ENCOUNTER — Ambulatory Visit (HOSPITAL_BASED_OUTPATIENT_CLINIC_OR_DEPARTMENT_OTHER): Payer: BLUE CROSS/BLUE SHIELD | Admitting: Family

## 2015-09-14 VITALS — BP 123/65 | HR 100 | Temp 98.9°F | Resp 16 | Ht 68.0 in | Wt 181.0 lb

## 2015-09-14 DIAGNOSIS — D693 Immune thrombocytopenic purpura: Secondary | ICD-10-CM

## 2015-09-14 DIAGNOSIS — Z9081 Acquired absence of spleen: Secondary | ICD-10-CM

## 2015-09-14 DIAGNOSIS — J189 Pneumonia, unspecified organism: Secondary | ICD-10-CM | POA: Diagnosis not present

## 2015-09-14 LAB — CBC WITH DIFFERENTIAL (CANCER CENTER ONLY)
BASO#: 0.1 10*3/uL (ref 0.0–0.2)
BASO%: 0.5 % (ref 0.0–2.0)
EOS%: 2.6 % (ref 0.0–7.0)
Eosinophils Absolute: 0.3 10*3/uL (ref 0.0–0.5)
HCT: 32.1 % — ABNORMAL LOW (ref 38.7–49.9)
HGB: 10 g/dL — ABNORMAL LOW (ref 13.0–17.1)
LYMPH#: 1 10*3/uL (ref 0.9–3.3)
LYMPH%: 10.4 % — ABNORMAL LOW (ref 14.0–48.0)
MCH: 27.4 pg — AB (ref 28.0–33.4)
MCHC: 31.2 g/dL — AB (ref 32.0–35.9)
MCV: 88 fL (ref 82–98)
MONO#: 1.3 10*3/uL — ABNORMAL HIGH (ref 0.1–0.9)
MONO%: 12.9 % (ref 0.0–13.0)
NEUT#: 7.1 10*3/uL — ABNORMAL HIGH (ref 1.5–6.5)
NEUT%: 73.6 % (ref 40.0–80.0)
PLATELETS: 57 10*3/uL — AB (ref 145–400)
RBC: 3.65 10*6/uL — ABNORMAL LOW (ref 4.20–5.70)
RDW: 16.9 % — AB (ref 11.1–15.7)
WBC: 9.7 10*3/uL (ref 4.0–10.0)

## 2015-09-14 LAB — CULTURE, BODY FLUID-BOTTLE: CULTURE: NO GROWTH

## 2015-09-14 LAB — CULTURE, BODY FLUID W GRAM STAIN -BOTTLE

## 2015-09-14 NOTE — Progress Notes (Signed)
Hematology and Oncology Follow Up Visit  Scott Waters [id number] 06-26-1956 59 y.o. 09/14/2015   Principle Diagnosis:  Refractory immune thrombocytopenia - s/p splenectomy   Current Therapy:   Promacta 50 mg Po daily     Interim History:  Scott Waters is here today for for a follow-up since being discharged from the hospital. He had a right lower lobe pneumonia and had 780 ml of fluid removed during thoracentesis.  He was discharged on Levaquin and is feeling much better. He still has a dry cough but this is much improved. He denies SOB or chest discomfort. He has had no fever or chills.  No rash, palpitations, abdominal pain or changes in bowel or bladder habits.  His platelet count today is 67. He has had a little blood on the tissue when he blows his nose since turning the heat on in his house. No bruising or other episodes of bleeding.  His Hgb is up to 10.0 with an MCV of 88.  No swelling or tenderness in his extremities. No c/o pain at this time.  His appetite is much improved and he is staying hydrated. His weight is stable.   Medications:    Medication List       This list is accurate as of: 09/14/15  1:24 PM.  Always use your most recent med list.               AZOR 10-40 MG tablet  Generic drug:  amLODipine-olmesartan  Take 1 tablet by mouth every morning.     eltrombopag 50 MG tablet  Commonly known as:  PROMACTA  Take 1 tablet (50 mg total) by mouth daily. Take on an empty stomach 1 hour before a meal or 2 hours after     fexofenadine 180 MG tablet  Commonly known as:  ALLEGRA  Take 180 mg by mouth every morning.     fluticasone 50 MCG/ACT nasal spray  Commonly known as:  FLONASE  instill 2 sprays into each nostril once daily     folic acid 1 MG tablet  Commonly known as:  FOLVITE  Take 1 tablet (1 mg total) by mouth daily.     levofloxacin 500 MG tablet  Commonly known as:  LEVAQUIN  Take 1 tablet (500 mg total) by mouth daily.     multivitamin  with minerals Tabs tablet  Take 1 tablet by mouth every morning.     pantoprazole 40 MG tablet  Commonly known as:  PROTONIX  Take 1 tablet (40 mg total) by mouth daily.     potassium chloride 10 MEQ tablet  Commonly known as:  K-DUR,KLOR-CON  take 2 tablets by mouth once daily     PROBIOTIC DAILY PO  Take 1 tablet by mouth 2 (two) times a week.     simvastatin 40 MG tablet  Commonly known as:  ZOCOR  Take 40 mg by mouth daily at 6 PM.        Allergies: No Known Allergies  Past Medical History, Surgical history, Social history, and Family History were reviewed and updated.  Review of Systems: All other 10 point review of systems is negative.   Physical Exam:  vitals were not taken for this visit.  Wt Readings from Last 3 Encounters:  09/11/15 199 lb 1.2 oz (90.3 kg)  08/22/15 194 lb (87.998 kg)  08/13/15 192 lb 14.4 oz (87.5 kg)    Ocular: Sclerae unicteric, pupils equal, round and reactive to light Ear-nose-throat: Oropharynx clear, dentition fair  Lymphatic: No cervical or supraclavicular adenopathy Lungs no rales or rhonchi, good excursion bilaterally Heart regular rate and rhythm, no murmur appreciated Abd soft, nontender, positive bowel sounds MSK no focal spinal tenderness, no joint edema Neuro: non-focal, well-oriented, appropriate affect Breasts: Deferred  Lab Results  Component Value Date   WBC 13.2* 09/11/2015   HGB 9.2* 09/11/2015   HCT 29.1* 09/11/2015   MCV 86.6 09/11/2015   PLT 59* 09/11/2015   No results found for: FERRITIN, IRON, TIBC, UIBC, IRONPCTSAT Lab Results  Component Value Date   RETICCTPCT 2.6* 05/13/2014   RBC 3.36* 09/11/2015   RETICCTABS 91.0 05/13/2014   No results found for: KPAFRELGTCHN, LAMBDASER, KAPLAMBRATIO No results found for: IGGSERUM, IGA, IGMSERUM No results found for: Odetta Pink, SPEI   Chemistry      Component Value Date/Time   NA 137 09/09/2015 0722    NA 139 07/20/2014 0902   K 3.2* 09/09/2015 0722   K 3.3 07/20/2014 0902   CL 107 09/09/2015 0722   CL 103 07/20/2014 0902   CO2 23 09/09/2015 0722   CO2 27 07/20/2014 0902   BUN 12 09/09/2015 0722   BUN 11 07/20/2014 0902   CREATININE 0.82 09/09/2015 0722   CREATININE 0.9 07/20/2014 0902      Component Value Date/Time   CALCIUM 8.1* 09/09/2015 0722   CALCIUM 8.6 07/20/2014 0902   ALKPHOS 66 09/09/2015 0722   ALKPHOS 75 07/20/2014 0902   AST 101* 09/09/2015 0722   AST 51* 07/20/2014 0902   ALT 113* 09/09/2015 0722   ALT 36 07/20/2014 0902   BILITOT 0.5 09/09/2015 0722   BILITOT 0.80 07/20/2014 0902     Impression and Plan: Scott Waters is a pleasant 59 yo gentleman with refractory immune thrombocytopenia. He is recuperating from pneumonia nicely. He is now back home and on Levaquin. His cough is better and he is no longer having SOB.  His platelet count is holding at 57 with a Hgb of 10.0. He is doing well and is asymptomatic with this at this time.  We will continue to check his lab work every 2 weeks and plan to see him back for a follow-up in 1 months.  He knows to contact us with any questions or concerns. We can certainly see her sooner if need be.   Eliezer Bottom, NP 10/20/20161:24 PM

## 2015-09-18 ENCOUNTER — Ambulatory Visit (HOSPITAL_BASED_OUTPATIENT_CLINIC_OR_DEPARTMENT_OTHER)
Admission: RE | Admit: 2015-09-18 | Discharge: 2015-09-18 | Disposition: A | Discharge status: Home / Self Care | Payer: BLUE CROSS/BLUE SHIELD | Source: Ambulatory Visit | Attending: Family | Admitting: Family

## 2015-09-18 ENCOUNTER — Other Ambulatory Visit: Payer: Self-pay | Admitting: Family

## 2015-09-18 ENCOUNTER — Telehealth: Payer: Self-pay | Admitting: Emergency Medicine

## 2015-09-18 DIAGNOSIS — J189 Pneumonia, unspecified organism: Secondary | ICD-10-CM

## 2015-09-18 DIAGNOSIS — J90 Pleural effusion, not elsewhere classified: Secondary | ICD-10-CM | POA: Diagnosis not present

## 2015-09-18 DIAGNOSIS — R079 Chest pain, unspecified: Secondary | ICD-10-CM | POA: Diagnosis present

## 2015-09-18 DIAGNOSIS — R918 Other nonspecific abnormal finding of lung field: Secondary | ICD-10-CM | POA: Diagnosis not present

## 2015-09-18 MED ORDER — LEVOFLOXACIN 750 MG PO TABS: 750.0000 mg | ORAL_TABLET | Freq: Every day | ORAL | Status: DC

## 2015-09-18 NOTE — Telephone Encounter (Signed)
Patient called to state that he experienced some left side chest discomfort over the weekend and some wheezing. States pain is improving but is concerned that the pneumonia may now be in the left lung. Patient completed the Levaquin 5 day dose pack over the weekend. Sarah NP aware and orders given for CXR. Patient aware and will come for cxr today or tomorrow.

## 2015-09-18 NOTE — Progress Notes (Signed)
Scott Waters c/o productive cough and wheezing. No SOB or fatigue. No fever. Chest xray today showed small bilateral pleural effusions with likely atelectasis. He completed 1 round of Levaquin 500 mg x 5 days this past weekend. We will have him complete another 5 days of Levaquin 750 mg PO daily. He is in agreement with this and will notify us if his symptoms worsen.

## 2015-10-02 ENCOUNTER — Other Ambulatory Visit: Payer: BLUE CROSS/BLUE SHIELD

## 2015-10-02 ENCOUNTER — Other Ambulatory Visit (HOSPITAL_BASED_OUTPATIENT_CLINIC_OR_DEPARTMENT_OTHER): Payer: BLUE CROSS/BLUE SHIELD

## 2015-10-02 ENCOUNTER — Encounter: Payer: Self-pay | Admitting: Family

## 2015-10-02 ENCOUNTER — Ambulatory Visit (HOSPITAL_BASED_OUTPATIENT_CLINIC_OR_DEPARTMENT_OTHER): Payer: BLUE CROSS/BLUE SHIELD | Admitting: Family

## 2015-10-02 ENCOUNTER — Ambulatory Visit: Payer: BLUE CROSS/BLUE SHIELD | Admitting: Hematology & Oncology

## 2015-10-02 VITALS — BP 144/85 | HR 98 | Temp 98.2°F | Resp 16 | Ht 68.0 in | Wt 181.0 lb

## 2015-10-02 DIAGNOSIS — Z9081 Acquired absence of spleen: Secondary | ICD-10-CM

## 2015-10-02 DIAGNOSIS — D693 Immune thrombocytopenic purpura: Secondary | ICD-10-CM

## 2015-10-02 LAB — CBC WITH DIFFERENTIAL (CANCER CENTER ONLY)
BASO#: 0.1 10*3/uL (ref 0.0–0.2)
BASO%: 0.5 % (ref 0.0–2.0)
EOS ABS: 0.4 10*3/uL (ref 0.0–0.5)
EOS%: 3.8 % (ref 0.0–7.0)
HEMATOCRIT: 32.6 % — AB (ref 38.7–49.9)
HEMOGLOBIN: 10.3 g/dL — AB (ref 13.0–17.1)
LYMPH#: 1.3 10*3/uL (ref 0.9–3.3)
LYMPH%: 13.1 % — ABNORMAL LOW (ref 14.0–48.0)
MCH: 26.5 pg — AB (ref 28.0–33.4)
MCHC: 31.6 g/dL — AB (ref 32.0–35.9)
MCV: 84 fL (ref 82–98)
MONO#: 1.1 10*3/uL — AB (ref 0.1–0.9)
MONO%: 10.7 % (ref 0.0–13.0)
NEUT%: 71.9 % (ref 40.0–80.0)
NEUTROS ABS: 7.3 10*3/uL — AB (ref 1.5–6.5)
Platelets: 105 10*3/uL — ABNORMAL LOW (ref 145–400)
RBC: 3.89 10*6/uL — ABNORMAL LOW (ref 4.20–5.70)
RDW: 17.8 % — ABNORMAL HIGH (ref 11.1–15.7)
WBC: 10.2 10*3/uL — AB (ref 4.0–10.0)

## 2015-10-02 NOTE — Progress Notes (Signed)
Hematology and Oncology Follow Up Visit  Akio Hudnall [id number] Oct 03, 1956 59 y.o. 10/02/2015   Principle Diagnosis:  Refractory immune thrombocytopenia - s/p splenectomy   Current Therapy:   Promacta 50 mg Po daily     Interim History:  Mr. Glatfelter is here today for for a follow-up. He is feeling tired today. He states that he went back to work for the first time today and is feeling "worn out."  He has responded nicely to the [place]. His platelet count is now up to 105.  No episodes of bleeding, bruising or petechiae.  His pneumonia has resolved. No SOB or cough. No fever, chills, n/v, rash, chest pain, palpitations, abdominal pain or changes in bowel or bladder habits.  No swelling or tenderness in his extremities. He states that his appetite is decreased and that "nothering tastes good." His weight is unchanged since his last visit. He plans to eat smaller more frequent meals and supplement with ensure or boost as needed. He is staying hydrated.   Medications:    Medication List       This list is accurate as of: 10/02/15  3:22 PM.  Always use your most recent med list.               AZOR 10-40 MG tablet  Generic drug:  amLODipine-olmesartan  Take 1 tablet by mouth every morning.     eltrombopag 50 MG tablet  Commonly known as:  PROMACTA  Take 1 tablet (50 mg total) by mouth daily. Take on an empty stomach 1 hour before a meal or 2 hours after     fexofenadine 180 MG tablet  Commonly known as:  ALLEGRA  Take 180 mg by mouth every morning.     fluticasone 50 MCG/ACT nasal spray  Commonly known as:  FLONASE  instill 2 sprays into each nostril once daily     folic acid 1 MG tablet  Commonly known as:  FOLVITE  Take 1 tablet (1 mg total) by mouth daily.     levofloxacin 750 MG tablet  Commonly known as:  LEVAQUIN  Take 1 tablet (750 mg total) by mouth daily.     multivitamin with minerals Tabs tablet  Take 1 tablet by mouth every morning.     pantoprazole  40 MG tablet  Commonly known as:  PROTONIX  Take 1 tablet (40 mg total) by mouth daily.     potassium chloride 10 MEQ tablet  Commonly known as:  K-DUR,KLOR-CON  take 2 tablets by mouth once daily     PROBIOTIC DAILY PO  Take 1 tablet by mouth 2 (two) times a week.     simvastatin 40 MG tablet  Commonly known as:  ZOCOR  Take 40 mg by mouth daily at 6 PM.        Allergies: No Known Allergies  Past Medical History, Surgical history, Social history, and Family History were reviewed and updated.  Review of Systems: All other 10 point review of systems is negative.   Physical Exam:  height is 5\' 8"  (1.727 m) and weight is 181 lb (82.101 kg). His oral temperature is 98.2 F (36.8 C). His blood pressure is 144/85 and his pulse is 98. His respiration is 16.   Wt Readings from Last 3 Encounters:  10/02/15 181 lb (82.101 kg)  09/14/15 181 lb (82.101 kg)  09/11/15 199 lb 1.2 oz (90.3 kg)    Ocular: Sclerae unicteric, pupils equal, round and reactive to light Ear-nose-throat: Oropharynx clear, dentition  fair Lymphatic: No cervical or supraclavicular adenopathy Lungs no rales or rhonchi, good excursion bilaterally Heart regular rate and rhythm, no murmur appreciated Abd soft, nontender, positive bowel sounds MSK no focal spinal tenderness, no joint edema Neuro: non-focal, well-oriented, appropriate affect Breasts: Deferred  Lab Results  Component Value Date   WBC 10.2* 10/02/2015   HGB 10.3* 10/02/2015   HCT 32.6* 10/02/2015   MCV 84 10/02/2015   PLT 105* 10/02/2015   No results found for: FERRITIN, IRON, TIBC, UIBC, IRONPCTSAT Lab Results  Component Value Date   RETICCTPCT 2.6* 05/13/2014   RBC 3.89* 10/02/2015   RETICCTABS 91.0 05/13/2014   No results found for: KPAFRELGTCHN, LAMBDASER, KAPLAMBRATIO No results found for: IGGSERUM, IGA, IGMSERUM No results found for: Odetta Pink, SPEI   Chemistry        Component Value Date/Time   NA 137 09/09/2015 0722   NA 139 07/20/2014 0902   K 3.2* 09/09/2015 0722   K 3.3 07/20/2014 0902   CL 107 09/09/2015 0722   CL 103 07/20/2014 0902   CO2 23 09/09/2015 0722   CO2 27 07/20/2014 0902   BUN 12 09/09/2015 0722   BUN 11 07/20/2014 0902   CREATININE 0.82 09/09/2015 0722   CREATININE 0.9 07/20/2014 0902      Component Value Date/Time   CALCIUM 8.1* 09/09/2015 0722   CALCIUM 8.6 07/20/2014 0902   ALKPHOS 66 09/09/2015 0722   ALKPHOS 75 07/20/2014 0902   AST 101* 09/09/2015 0722   AST 51* 07/20/2014 0902   ALT 113* 09/09/2015 0722   ALT 36 07/20/2014 0902   BILITOT 0.5 09/09/2015 0722   BILITOT 0.80 07/20/2014 0902     Impression and Plan: Mr. Vestal is a pleasant 59 yo gentleman with refractory immune thrombocytopenia. He has responded nicely to the [facility] and his platelet count is now up to 105. No episdoes of bleeding or bruising.  He is fatigued since going back to work today. He wishes he could retire.  We will plan to see him back in 3 weeks for labs and follow-up.  He knows to contact us with any questions or concerns. We can certainly see her sooner if need be.   Eliezer Bottom, NP 11/7/20163:22 PM

## 2015-10-10 ENCOUNTER — Encounter: Payer: Self-pay | Admitting: Nurse Practitioner

## 2015-10-11 ENCOUNTER — Telehealth: Payer: Self-pay | Admitting: Hematology & Oncology

## 2015-10-11 NOTE — Telephone Encounter (Signed)
Faxed medical records and completed form to: [facility] DISABILTY P: [id number] F: [phone]   Leave Id: [id number]  Leave Dates: 09/12/2015 - 10/02/2015      COPY SCANNED

## 2015-10-11 NOTE — Telephone Encounter (Signed)
Faxed medical records to: [facility] for dos 08/22/2015 P: [id number] F: [phone]   Medical Records Req: [phone]       COPY SCANNED

## 2015-10-13 ENCOUNTER — Encounter: Payer: Self-pay | Admitting: *Deleted

## 2015-10-24 ENCOUNTER — Telehealth: Payer: Self-pay | Admitting: Hematology & Oncology

## 2015-10-24 NOTE — Telephone Encounter (Signed)
[employer] APPROVAL  Case Number: [id number] Status: Approved Dates: 10/20/2015 - 10/19/2016 AMICAR SOL 0.25/ML   P: [id number]    COPY SCANNED

## 2015-10-25 ENCOUNTER — Encounter: Payer: Self-pay | Admitting: Hematology & Oncology

## 2015-10-25 ENCOUNTER — Other Ambulatory Visit (HOSPITAL_BASED_OUTPATIENT_CLINIC_OR_DEPARTMENT_OTHER): Payer: BLUE CROSS/BLUE SHIELD

## 2015-10-25 ENCOUNTER — Other Ambulatory Visit: Payer: Self-pay | Admitting: *Deleted

## 2015-10-25 ENCOUNTER — Ambulatory Visit (HOSPITAL_BASED_OUTPATIENT_CLINIC_OR_DEPARTMENT_OTHER): Payer: BLUE CROSS/BLUE SHIELD | Admitting: Hematology & Oncology

## 2015-10-25 VITALS — BP 134/69 | HR 68 | Temp 98.6°F | Resp 95 | Ht 68.0 in | Wt 181.0 lb

## 2015-10-25 DIAGNOSIS — D693 Immune thrombocytopenic purpura: Secondary | ICD-10-CM

## 2015-10-25 DIAGNOSIS — Z9081 Acquired absence of spleen: Secondary | ICD-10-CM

## 2015-10-25 LAB — CBC WITH DIFFERENTIAL (CANCER CENTER ONLY)
BASO#: 0.1 10*3/uL (ref 0.0–0.2)
BASO%: 0.6 % (ref 0.0–2.0)
EOS ABS: 0.4 10*3/uL (ref 0.0–0.5)
EOS%: 4.6 % (ref 0.0–7.0)
HEMATOCRIT: 36.4 % — AB (ref 38.7–49.9)
HEMOGLOBIN: 11.6 g/dL — AB (ref 13.0–17.1)
LYMPH#: 2.1 10*3/uL (ref 0.9–3.3)
LYMPH%: 24.9 % (ref 14.0–48.0)
MCH: 26.4 pg — AB (ref 28.0–33.4)
MCHC: 31.9 g/dL — AB (ref 32.0–35.9)
MCV: 83 fL (ref 82–98)
MONO#: 1.3 10*3/uL — ABNORMAL HIGH (ref 0.1–0.9)
MONO%: 14.7 % — ABNORMAL HIGH (ref 0.0–13.0)
NEUT%: 55.2 % (ref 40.0–80.0)
NEUTROS ABS: 4.7 10*3/uL (ref 1.5–6.5)
Platelets: 15 10*3/uL — ABNORMAL LOW (ref 145–400)
RBC: 4.39 10*6/uL (ref 4.20–5.70)
RDW: 19 % — ABNORMAL HIGH (ref 11.1–15.7)
WBC: 8.5 10*3/uL (ref 4.0–10.0)

## 2015-10-25 MED ORDER — ELTROMBOPAG OLAMINE 75 MG PO TABS: 75.0000 mg | ORAL_TABLET | Freq: Every day | ORAL | Status: DC

## 2015-10-25 NOTE — Progress Notes (Signed)
Hematology and Oncology Follow Up Visit  Scott Waters [id number] May 06, 1956 59 y.o. 10/25/2015   Principle Diagnosis:  Refractory immune thrombocytopenia - s/p splenectomy   Current Therapy:   Promacta 75 mg PO daily     Interim History:  Scott Waters is here today for for a follow-up. He  Feels pretty good. He is working. He's had no problems at work.  He has not noted any bleeding or bruising. He's had no problems with nausea or vomiting. He's had no leg swelling. He's had no off or shortness of breath. He's had no nausea or vomiting.  He's had no change in his medications.   Medications:    Medication List       This list is accurate as of: 10/25/15  3:45 PM.  Always use your most recent med list.               AZOR 10-40 MG tablet  Generic drug:  amLODipine-olmesartan  Take 1 tablet by mouth every morning.     eltrombopag 75 MG tablet  Commonly known as:  PROMACTA  Take 1 tablet (75 mg total) by mouth daily. Take on an empty stomach 1 hour before a meal or 2 hours after     fexofenadine 180 MG tablet  Commonly known as:  ALLEGRA  Take 180 mg by mouth every morning.     fluticasone 50 MCG/ACT nasal spray  Commonly known as:  FLONASE  instill 2 sprays into each nostril once daily     folic acid 1 MG tablet  Commonly known as:  FOLVITE  Take 1 tablet (1 mg total) by mouth daily.     levofloxacin 750 MG tablet  Commonly known as:  LEVAQUIN  Take 1 tablet (750 mg total) by mouth daily.     multivitamin with minerals Tabs tablet  Take 1 tablet by mouth every morning.     pantoprazole 40 MG tablet  Commonly known as:  PROTONIX  Take 1 tablet (40 mg total) by mouth daily.     potassium chloride 10 MEQ tablet  Commonly known as:  K-DUR,KLOR-CON  take 2 tablets by mouth once daily     PROBIOTIC DAILY PO  Take 1 tablet by mouth 2 (two) times a week.     simvastatin 40 MG tablet  Commonly known as:  ZOCOR  Take 40 mg by mouth daily at 6 PM.         Allergies: No Known Allergies  Past Medical History, Surgical history, Social history, and Family History were reviewed and updated.  Review of Systems: All other 10 point review of systems is negative.   Physical Exam:  height is 5\' 8"  (1.727 m) and weight is 181 lb (82.101 kg). His oral temperature is 98.6 F (37 C). His blood pressure is 134/69 and his pulse is 68. His respiration is 95.   Wt Readings from Last 3 Encounters:  10/25/15 181 lb (82.101 kg)  10/02/15 181 lb (82.101 kg)  09/14/15 181 lb (82.101 kg)      well-developed and well-nourished African-American male. There are no ocular or oral lesions. There are no palpable cervical or supraclavicular lymph nodes. Lungs are clear laterally. Cardiac exam regular rate and rhythm with a normal S1 and S2. There are no murmurs, rubs or bruits. Abdomen is soft. He has good bowel sounds. There is no fluid wave. He has well-healed laparoscopy scar from his splenectomy. Is no palpable hepatomegaly. Back exam shows no tenderness over the spine,  ribs or hips. Extremities shows no clubbing, cyanosis or edema. Skin exam shows no rashes, ecchymoses or petechia. Neurological exam shows no focal neurological deficits.  Lab Results  Component Value Date   WBC 8.5 10/25/2015   HGB 11.6* 10/25/2015   HCT 36.4* 10/25/2015   MCV 83 10/25/2015   PLT 15* 10/25/2015   No results found for: FERRITIN, IRON, TIBC, UIBC, IRONPCTSAT Lab Results  Component Value Date   RETICCTPCT 2.6* 05/13/2014   RBC 4.39 10/25/2015   RETICCTABS 91.0 05/13/2014   No results found for: KPAFRELGTCHN, LAMBDASER, KAPLAMBRATIO No results found for: IGGSERUM, IGA, IGMSERUM No results found for: Odetta Pink, SPEI   Chemistry      Component Value Date/Time   NA 137 09/09/2015 0722   NA 139 07/20/2014 0902   K 3.2* 09/09/2015 0722   K 3.3 07/20/2014 0902   CL 107 09/09/2015 0722   CL 103 07/20/2014 0902    CO2 23 09/09/2015 0722   CO2 27 07/20/2014 0902   BUN 12 09/09/2015 0722   BUN 11 07/20/2014 0902   CREATININE 0.82 09/09/2015 0722   CREATININE 0.9 07/20/2014 0902      Component Value Date/Time   CALCIUM 8.1* 09/09/2015 0722   CALCIUM 8.6 07/20/2014 0902   ALKPHOS 66 09/09/2015 0722   ALKPHOS 75 07/20/2014 0902   AST 101* 09/09/2015 0722   AST 51* 07/20/2014 0902   ALT 113* 09/09/2015 0722   ALT 36 07/20/2014 0902   BILITOT 0.5 09/09/2015 0722   BILITOT 0.80 07/20/2014 0902     Impression and Plan: Scott Waters is a pleasant 59 yo gentleman with refractory immune thrombocytopenia.  Like everything else, his response has been transient. He seemed to be doing quite well and all of a sudden his platelet counts go down.    I will try to increase his Promacta up to 75 mg a day.    we will have to get him back to see [place] in another 2-3 weeks.    [name], MD 11/30/20163:45 PM

## 2015-11-03 ENCOUNTER — Telehealth: Payer: Self-pay | Admitting: Hematology & Oncology

## 2015-11-03 NOTE — Telephone Encounter (Signed)
°  Faxed medical records and completed form to: [facility] DISABILTY P: [id number] F: [phone]   Leave Id: [id number]  Leave Dates: 10/16/2015 - 10/29/2015     COPY SCANNED

## 2015-11-07 ENCOUNTER — Encounter: Payer: Self-pay | Admitting: Hematology & Oncology

## 2015-11-09 ENCOUNTER — Encounter: Payer: Self-pay | Admitting: Hematology & Oncology

## 2015-11-17 ENCOUNTER — Encounter: Payer: Self-pay | Admitting: Hematology & Oncology

## 2015-11-17 ENCOUNTER — Other Ambulatory Visit (HOSPITAL_BASED_OUTPATIENT_CLINIC_OR_DEPARTMENT_OTHER): Payer: BLUE CROSS/BLUE SHIELD

## 2015-11-17 ENCOUNTER — Ambulatory Visit (HOSPITAL_BASED_OUTPATIENT_CLINIC_OR_DEPARTMENT_OTHER): Payer: BLUE CROSS/BLUE SHIELD | Admitting: Hematology & Oncology

## 2015-11-17 ENCOUNTER — Telehealth: Payer: Self-pay | Admitting: *Deleted

## 2015-11-17 VITALS — BP 130/74 | HR 91 | Temp 98.4°F | Resp 16 | Ht 68.0 in | Wt 186.0 lb

## 2015-11-17 DIAGNOSIS — D693 Immune thrombocytopenic purpura: Secondary | ICD-10-CM

## 2015-11-17 LAB — CBC WITH DIFFERENTIAL (CANCER CENTER ONLY)
BASO#: 0.1 10*3/uL (ref 0.0–0.2)
BASO%: 0.6 % (ref 0.0–2.0)
EOS%: 1.8 % (ref 0.0–7.0)
Eosinophils Absolute: 0.2 10*3/uL (ref 0.0–0.5)
HEMATOCRIT: 34.2 % — AB (ref 38.7–49.9)
HEMOGLOBIN: 10.9 g/dL — AB (ref 13.0–17.1)
LYMPH#: 1.6 10*3/uL (ref 0.9–3.3)
LYMPH%: 18.3 % (ref 14.0–48.0)
MCH: 27 pg — ABNORMAL LOW (ref 28.0–33.4)
MCHC: 31.9 g/dL — ABNORMAL LOW (ref 32.0–35.9)
MCV: 85 fL (ref 82–98)
MONO#: 1 10*3/uL — AB (ref 0.1–0.9)
MONO%: 11.7 % (ref 0.0–13.0)
NEUT%: 67.6 % (ref 40.0–80.0)
NEUTROS ABS: 5.9 10*3/uL (ref 1.5–6.5)
Platelets: <6 10*3/uL — CL (ref 145–400)
RBC: 4.03 10*6/uL — AB (ref 4.20–5.70)
RDW: 19.1 % — AB (ref 11.1–15.7)
WBC: 8.7 10*3/uL (ref 4.0–10.0)

## 2015-11-17 LAB — TECHNOLOGIST REVIEW CHCC SATELLITE

## 2015-11-17 LAB — CHCC SATELLITE - SMEAR

## 2015-11-17 NOTE — Telephone Encounter (Signed)
Critical Value Platelets <6 Dr [name] notified. He will place orders

## 2015-11-17 NOTE — Progress Notes (Signed)
Hematology and Oncology Follow Up Visit  Scott Waters [id number] 03-Apr-1956 59 y.o. 11/17/2015   Principle Diagnosis:  Refractory immune thrombocytopenia - s/p splenectomy   Current Therapy:   Promacta 75 mg PO daily  IVIG as needed.    Interim History:  Scott Waters is here today for for a follow-up. He is doing okay. He is still working.  He's had no problems with bleeding. He does have a rare nosebleed.  He's had no problems with nausea vomiting. He's had no abdominal pain. He's had no change in bowel or bladder habits. There's been no leg swelling. He's had no rashes.  He's had no fever. He's had no cough or shortness of breath.  He's on Promacta. He's been doing okay with Promacta.   Overall, his performance status is ECOG 1.   Medications:    Medication List       This list is accurate as of: 11/17/15  2:30 PM.  Always use your most recent med list.               AZOR 10-40 MG tablet  Generic drug:  amLODipine-olmesartan  Take 1 tablet by mouth every morning.     eltrombopag 75 MG tablet  Commonly known as:  PROMACTA  Take 1 tablet (75 mg total) by mouth daily. Take on an empty stomach 1 hour before a meal or 2 hours after     fexofenadine 180 MG tablet  Commonly known as:  ALLEGRA  Take 180 mg by mouth every morning.     fluticasone 50 MCG/ACT nasal spray  Commonly known as:  FLONASE  instill 2 sprays into each nostril once daily     folic acid 1 MG tablet  Commonly known as:  FOLVITE  Take 1 tablet (1 mg total) by mouth daily.     levofloxacin 750 MG tablet  Commonly known as:  LEVAQUIN  Take 1 tablet (750 mg total) by mouth daily.     multivitamin with minerals Tabs tablet  Take 1 tablet by mouth every morning.     pantoprazole 40 MG tablet  Commonly known as:  PROTONIX  Take 1 tablet (40 mg total) by mouth daily.     potassium chloride 10 MEQ tablet  Commonly known as:  K-DUR,KLOR-CON  take 2 tablets by mouth once daily     PROBIOTIC  DAILY PO  Take 1 tablet by mouth 2 (two) times a week.     simvastatin 40 MG tablet  Commonly known as:  ZOCOR  Take 40 mg by mouth daily at 6 PM.        Allergies: No Known Allergies  Past Medical History, Surgical history, Social history, and Family History were reviewed and updated.  Review of Systems: All other 10 point review of systems is negative.   Physical Exam:  height is 5\' 8"  (1.727 m) and weight is 186 lb (84.369 kg). His oral temperature is 98.4 F (36.9 C). His blood pressure is 130/74 and his pulse is 91. His respiration is 16.   Wt Readings from Last 3 Encounters:  11/17/15 186 lb (84.369 kg)  10/25/15 181 lb (82.101 kg)  10/02/15 181 lb (82.101 kg)      well-developed and well-nourished African-American male. There are no ocular or oral lesions. There are no palpable cervical or supraclavicular lymph nodes. Lungs are clear laterally. Cardiac exam regular rate and rhythm with a normal S1 and S2. There are no murmurs, rubs or bruits. Abdomen is soft. He  has good bowel sounds. There is no fluid wave. He has well-healed laparoscopy scar from his splenectomy. Is no palpable hepatomegaly. Back exam shows no tenderness over the spine, ribs or hips. Extremities shows no clubbing, cyanosis or edema. Skin exam shows no rashes, ecchymoses or petechia. Neurological exam shows no focal neurological deficits.  Lab Results  Component Value Date   WBC 8.7 11/17/2015   HGB 10.9* 11/17/2015   HCT 34.2* 11/17/2015   MCV 85 11/17/2015   PLT <6* 11/17/2015   No results found for: FERRITIN, IRON, TIBC, UIBC, IRONPCTSAT Lab Results  Component Value Date   RETICCTPCT 2.6* 05/13/2014   RBC 4.03* 11/17/2015   RETICCTABS 91.0 05/13/2014   No results found for: KPAFRELGTCHN, LAMBDASER, KAPLAMBRATIO No results found for: IGGSERUM, IGA, IGMSERUM No results found for: Odetta Pink, SPEI   Chemistry      Component Value  Date/Time   NA 137 09/09/2015 0722   NA 139 07/20/2014 0902   K 3.2* 09/09/2015 0722   K 3.3 07/20/2014 0902   CL 107 09/09/2015 0722   CL 103 07/20/2014 0902   CO2 23 09/09/2015 0722   CO2 27 07/20/2014 0902   BUN 12 09/09/2015 0722   BUN 11 07/20/2014 0902   CREATININE 0.82 09/09/2015 0722   CREATININE 0.9 07/20/2014 0902      Component Value Date/Time   CALCIUM 8.1* 09/09/2015 0722   CALCIUM 8.6 07/20/2014 0902   ALKPHOS 66 09/09/2015 0722   ALKPHOS 75 07/20/2014 0902   AST 101* 09/09/2015 0722   AST 51* 07/20/2014 0902   ALT 113* 09/09/2015 0722   ALT 36 07/20/2014 0902   BILITOT 0.5 09/09/2015 0722   BILITOT 0.80 07/20/2014 0902     Impression and Plan: Scott Waters is a pleasant 59 yo gentleman with refractory immune thrombocytopenia.  Like everything else, his response has been transient.    At this point time, we will go ahead and try him on IVIG. Even the Promacta, at the higher dose, is not helping. As such, he probably can stop this if he wishes.  We will give IVIG next week.  We will have his blood work checked weekly. I will plan to get him back to be seen in 3 weeks.     [name], MD 12/23/20162:30 PM

## 2015-11-21 ENCOUNTER — Other Ambulatory Visit (HOSPITAL_BASED_OUTPATIENT_CLINIC_OR_DEPARTMENT_OTHER): Payer: BLUE CROSS/BLUE SHIELD

## 2015-11-21 ENCOUNTER — Ambulatory Visit (HOSPITAL_BASED_OUTPATIENT_CLINIC_OR_DEPARTMENT_OTHER): Payer: BLUE CROSS/BLUE SHIELD

## 2015-11-21 VITALS — BP 124/76 | HR 87 | Temp 98.1°F | Resp 20

## 2015-11-21 DIAGNOSIS — Z9081 Acquired absence of spleen: Secondary | ICD-10-CM

## 2015-11-21 DIAGNOSIS — D693 Immune thrombocytopenic purpura: Secondary | ICD-10-CM | POA: Diagnosis not present

## 2015-11-21 LAB — CBC WITH DIFFERENTIAL (CANCER CENTER ONLY)
BASO#: 0 10*3/uL (ref 0.0–0.2)
BASO%: 0.3 % (ref 0.0–2.0)
EOS%: 0.8 % (ref 0.0–7.0)
Eosinophils Absolute: 0.1 10*3/uL (ref 0.0–0.5)
HCT: 33.1 % — ABNORMAL LOW (ref 38.7–49.9)
HGB: 10.6 g/dL — ABNORMAL LOW (ref 13.0–17.1)
LYMPH#: 1.6 10*3/uL (ref 0.9–3.3)
LYMPH%: 15.7 % (ref 14.0–48.0)
MCH: 27.2 pg — AB (ref 28.0–33.4)
MCHC: 32 g/dL (ref 32.0–35.9)
MCV: 85 fL (ref 82–98)
MONO#: 1.1 10*3/uL — AB (ref 0.1–0.9)
MONO%: 10.9 % (ref 0.0–13.0)
NEUT#: 7.4 10*3/uL — ABNORMAL HIGH (ref 1.5–6.5)
NEUT%: 72.3 % (ref 40.0–80.0)
RBC: 3.89 10*6/uL — ABNORMAL LOW (ref 4.20–5.70)
RDW: 18.7 % — AB (ref 11.1–15.7)
WBC: 10.2 10*3/uL — ABNORMAL HIGH (ref 4.0–10.0)

## 2015-11-21 LAB — TECHNOLOGIST REVIEW CHCC SATELLITE

## 2015-11-21 MED ORDER — ACETAMINOPHEN 325 MG PO TABS
650.0000 mg | ORAL_TABLET | Freq: Once | ORAL | Status: AC
  Administered 2015-11-21: 650 mg via ORAL

## 2015-11-21 MED ORDER — ACETAMINOPHEN 325 MG PO TABS
ORAL_TABLET | ORAL | Status: AC
  Filled 2015-11-21: qty 2

## 2015-11-21 MED ORDER — IMMUNE GLOBULIN (HUMAN) 20 GM/200ML IV SOLN
80.0000 g | Freq: Once | INTRAVENOUS | Status: AC
  Administered 2015-11-21: 80 g via INTRAVENOUS
  Filled 2015-11-21: qty 800

## 2015-11-21 MED ORDER — DIPHENHYDRAMINE HCL 25 MG PO CAPS
ORAL_CAPSULE | ORAL | Status: AC
Start: 2015-11-21 — End: 2015-11-21
  Filled 2015-11-21: qty 1

## 2015-11-21 MED ORDER — ROMIPLOSTIM INJECTION 500 MCG: 6.0000 ug/kg | Freq: Once | SUBCUTANEOUS | Status: DC

## 2015-11-21 MED ORDER — DIPHENHYDRAMINE HCL 25 MG PO CAPS
25.0000 mg | ORAL_CAPSULE | Freq: Once | ORAL | Status: AC
  Administered 2015-11-21: 25 mg via ORAL

## 2015-11-21 NOTE — Patient Instructions (Signed)

## 2015-11-22 ENCOUNTER — Ambulatory Visit (HOSPITAL_BASED_OUTPATIENT_CLINIC_OR_DEPARTMENT_OTHER): Payer: BLUE CROSS/BLUE SHIELD

## 2015-11-22 VITALS — BP 121/80 | HR 90 | Temp 97.7°F | Resp 18

## 2015-11-22 DIAGNOSIS — Z9081 Acquired absence of spleen: Secondary | ICD-10-CM

## 2015-11-22 DIAGNOSIS — D693 Immune thrombocytopenic purpura: Secondary | ICD-10-CM | POA: Diagnosis not present

## 2015-11-22 MED ORDER — ACETAMINOPHEN 325 MG PO TABS
ORAL_TABLET | ORAL | Status: AC
  Filled 2015-11-22: qty 2

## 2015-11-22 MED ORDER — IMMUNE GLOBULIN (HUMAN) 20 GM/200ML IV SOLN
1.0000 g/kg | Freq: Once | INTRAVENOUS | Status: AC
  Administered 2015-11-22: 80 g via INTRAVENOUS
  Filled 2015-11-22: qty 800

## 2015-11-22 MED ORDER — DIPHENHYDRAMINE HCL 25 MG PO CAPS
ORAL_CAPSULE | ORAL | Status: AC
  Filled 2015-11-22: qty 1

## 2015-11-22 MED ORDER — ACETAMINOPHEN 325 MG PO TABS
650.0000 mg | ORAL_TABLET | Freq: Once | ORAL | Status: AC
  Administered 2015-11-22: 650 mg via ORAL

## 2015-11-22 MED ORDER — SODIUM CHLORIDE 0.9 % IV SOLN
Freq: Once | INTRAVENOUS | Status: AC
  Administered 2015-11-22: 13:00:00 via INTRAVENOUS

## 2015-11-22 MED ORDER — DIPHENHYDRAMINE HCL 25 MG PO CAPS
25.0000 mg | ORAL_CAPSULE | Freq: Once | ORAL | Status: AC
  Administered 2015-11-22: 25 mg via ORAL

## 2015-11-22 NOTE — Patient Instructions (Signed)

## 2015-11-29 ENCOUNTER — Other Ambulatory Visit (HOSPITAL_BASED_OUTPATIENT_CLINIC_OR_DEPARTMENT_OTHER): Payer: BLUE CROSS/BLUE SHIELD

## 2015-11-29 DIAGNOSIS — D693 Immune thrombocytopenic purpura: Secondary | ICD-10-CM | POA: Diagnosis not present

## 2015-11-29 DIAGNOSIS — Z9081 Acquired absence of spleen: Secondary | ICD-10-CM

## 2015-11-29 LAB — CBC WITH DIFFERENTIAL (CANCER CENTER ONLY)
BASO#: 0 10*3/uL (ref 0.0–0.2)
BASO%: 0.6 % (ref 0.0–2.0)
EOS ABS: 0 10*3/uL (ref 0.0–0.5)
EOS%: 0.8 % (ref 0.0–7.0)
HCT: 32.8 % — ABNORMAL LOW (ref 38.7–49.9)
HGB: 10.4 g/dL — ABNORMAL LOW (ref 13.0–17.1)
LYMPH#: 1.3 10*3/uL (ref 0.9–3.3)
LYMPH%: 25.4 % (ref 14.0–48.0)
MCH: 27 pg — AB (ref 28.0–33.4)
MCHC: 31.7 g/dL — ABNORMAL LOW (ref 32.0–35.9)
MCV: 85 fL (ref 82–98)
MONO#: 1 10*3/uL — ABNORMAL HIGH (ref 0.1–0.9)
MONO%: 18.6 % — AB (ref 0.0–13.0)
NEUT#: 2.9 10*3/uL (ref 1.5–6.5)
NEUT%: 54.6 % (ref 40.0–80.0)
PLATELETS: 76 10*3/uL — AB (ref 145–400)
RBC: 3.85 10*6/uL — AB (ref 4.20–5.70)
RDW: 18.7 % — ABNORMAL HIGH (ref 11.1–15.7)
WBC: 5.3 10*3/uL (ref 4.0–10.0)

## 2015-12-06 ENCOUNTER — Telehealth: Payer: Self-pay | Admitting: *Deleted

## 2015-12-06 ENCOUNTER — Other Ambulatory Visit (HOSPITAL_BASED_OUTPATIENT_CLINIC_OR_DEPARTMENT_OTHER): Payer: BLUE CROSS/BLUE SHIELD

## 2015-12-06 ENCOUNTER — Encounter: Payer: Self-pay | Admitting: Family

## 2015-12-06 ENCOUNTER — Ambulatory Visit (HOSPITAL_BASED_OUTPATIENT_CLINIC_OR_DEPARTMENT_OTHER): Payer: BLUE CROSS/BLUE SHIELD | Admitting: Family

## 2015-12-06 VITALS — BP 132/78 | HR 92 | Temp 97.9°F | Resp 16 | Ht 68.0 in | Wt 188.0 lb

## 2015-12-06 DIAGNOSIS — D693 Immune thrombocytopenic purpura: Secondary | ICD-10-CM

## 2015-12-06 DIAGNOSIS — Z9081 Acquired absence of spleen: Secondary | ICD-10-CM

## 2015-12-06 LAB — CBC WITH DIFFERENTIAL (CANCER CENTER ONLY)
BASO#: 0 10*3/uL (ref 0.0–0.2)
BASO%: 0.3 % (ref 0.0–2.0)
EOS ABS: 0 10*3/uL (ref 0.0–0.5)
EOS%: 0.5 % (ref 0.0–7.0)
HEMATOCRIT: 31.4 % — AB (ref 38.7–49.9)
HEMOGLOBIN: 9.9 g/dL — AB (ref 13.0–17.1)
LYMPH#: 1.5 10*3/uL (ref 0.9–3.3)
LYMPH%: 23.7 % (ref 14.0–48.0)
MCH: 27 pg — AB (ref 28.0–33.4)
MCHC: 31.5 g/dL — ABNORMAL LOW (ref 32.0–35.9)
MCV: 86 fL (ref 82–98)
MONO#: 0.9 10*3/uL (ref 0.1–0.9)
MONO%: 14.7 % — AB (ref 0.0–13.0)
NEUT%: 60.8 % (ref 40.0–80.0)
NEUTROS ABS: 3.9 10*3/uL (ref 1.5–6.5)
Platelets: 7 10*3/uL — CL (ref 145–400)
RBC: 3.66 10*6/uL — AB (ref 4.20–5.70)
RDW: 18.3 % — ABNORMAL HIGH (ref 11.1–15.7)
WBC: 6.4 10*3/uL (ref 4.0–10.0)

## 2015-12-06 NOTE — Progress Notes (Signed)
Hematology and Oncology Follow Up Visit  Scott Waters [id number] 1956/05/16 60 y.o. 12/06/2015   Principle Diagnosis:  Refractory immune thrombocytopenia - s/p splenectomy   Current Therapy:   IVIG as needed    Interim History:  Scott Waters is here today for for a follow-up. 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  He has had several nose bleeds and bleeding gums. His platelet count today is 7. No bruising or petechiae.  No fever, chills, n/v, cough, rash, dizziness, SOB, chest pain, palpitations, abdominal pain or changes in bowel or bladder habits.  He is eating and staying hydrated. His weight is up 2 lbs since his last visit.  He has had numbness and tingling in his feet from standing for long periods of time at work. No swelling or tenderness in his extremities. No new aches or pains.   Medications:    Medication List       This list is accurate as of: 12/06/15  3:50 PM.  Always use your most recent med list.               AZOR 10-40 MG tablet  Generic drug:  amLODipine-olmesartan  Take 1 tablet by mouth every morning.     eltrombopag 75 MG tablet  Commonly known as:  PROMACTA  Take 1 tablet (75 mg total) by mouth daily. Take on an empty stomach 1 hour before a meal or 2 hours after     fexofenadine 180 MG tablet  Commonly known as:  ALLEGRA  Take 180 mg by mouth every morning.     fluticasone 50 MCG/ACT nasal spray  Commonly known as:  FLONASE  instill 2 sprays into each nostril once daily     folic acid 1 MG tablet  Commonly known as:  FOLVITE  Take 1 tablet (1 mg total) by mouth daily.     multivitamin with minerals Tabs tablet  Take 1 tablet by mouth every morning.     pantoprazole 40 MG tablet  Commonly known as:  PROTONIX  Take 1 tablet (40 mg total) by mouth daily.     potassium chloride 10 MEQ tablet  Commonly known as:  K-DUR,KLOR-CON  take 2 tablets by mouth once daily     PROBIOTIC DAILY PO  Take 1 tablet  by mouth 2 (two) times a week.     simvastatin 40 MG tablet  Commonly known as:  ZOCOR  Take 40 mg by mouth daily at 6 PM.        Allergies: No Known Allergies  Past Medical History, Surgical history, Social history, and Family History were reviewed and updated.  Review of Systems: All other 10 point review of systems is negative.   Physical Exam:  height is 5\' 8"  (1.727 m) and weight is 188 lb (85.276 kg). His oral temperature is 97.9 F (36.6 C). His blood pressure is 132/78 and his pulse is 92. His respiration is 16.   Wt Readings from Last 3 Encounters:  12/06/15 188 lb (85.276 kg)  11/17/15 186 lb (84.369 kg)  10/25/15 181 lb (82.101 kg)    Ocular: Sclerae unicteric, pupils equal, round and reactive to light Ear-nose-throat: Oropharynx clear, dentition fair Lymphatic: No cervical supraclavicular or axillary adenopathy Lungs no rales or rhonchi, good excursion bilaterally Heart regular rate and rhythm, no murmur appreciated Abd soft, nontender, positive bowel sounds, no liver tip palpated on exam MSK no focal spinal tenderness, no joint edema Neuro: non-focal, well-oriented, appropriate affect  Breasts: Deferred  Lab Results  Component Value Date   WBC 6.4 12/06/2015   HGB 9.9* 12/06/2015   HCT 31.4* 12/06/2015   MCV 86 12/06/2015   PLT 7* 12/06/2015   No results found for: FERRITIN, IRON, TIBC, UIBC, IRONPCTSAT Lab Results  Component Value Date   RETICCTPCT 2.6* 05/13/2014   RBC 3.66* 12/06/2015   RETICCTABS 91.0 05/13/2014   No results found for: KPAFRELGTCHN, LAMBDASER, KAPLAMBRATIO No results found for: IGGSERUM, IGA, IGMSERUM No results found for: Odetta Pink, SPEI   Chemistry      Component Value Date/Time   NA 137 09/09/2015 0722   NA 139 07/20/2014 0902   K 3.2* 09/09/2015 0722   K 3.3 07/20/2014 0902   CL 107 09/09/2015 0722   CL 103 07/20/2014 0902   CO2 23 09/09/2015 0722   CO2 27  07/20/2014 0902   BUN 12 09/09/2015 0722   BUN 11 07/20/2014 0902   CREATININE 0.82 09/09/2015 0722   CREATININE 0.9 07/20/2014 0902      Component Value Date/Time   CALCIUM 8.1* 09/09/2015 0722   CALCIUM 8.6 07/20/2014 0902   ALKPHOS 66 09/09/2015 0722   ALKPHOS 75 07/20/2014 0902   AST 101* 09/09/2015 0722   AST 51* 07/20/2014 0902   ALT 113* 09/09/2015 0722   ALT 36 07/20/2014 0902   BILITOT 0.5 09/09/2015 0722   BILITOT 0.80 07/20/2014 0902     Impression and Plan: Scott Waters is a pleasant 60 yo gentleman with refractory immune thrombocytopenia.  He is symptomatic with nose bleeds and bleeding gums.  We will plan to give him IVIG later this week.  We will continue to check his lab work weekly and plan to see him back in 3 weeks for labs and follow-up.  He knows to contact us with any questions or concerns. We can certainly see her sooner if need be.   Scott Bottom, NP 1/11/20173:50 PM

## 2015-12-06 NOTE — Telephone Encounter (Signed)
Critical Value Platelets 7 Dr [name] notified. No orders at this time.

## 2015-12-08 ENCOUNTER — Ambulatory Visit (HOSPITAL_BASED_OUTPATIENT_CLINIC_OR_DEPARTMENT_OTHER): Payer: BLUE CROSS/BLUE SHIELD

## 2015-12-08 VITALS — BP 109/74 | HR 93 | Temp 98.2°F | Resp 16

## 2015-12-08 DIAGNOSIS — Z9081 Acquired absence of spleen: Secondary | ICD-10-CM

## 2015-12-08 DIAGNOSIS — D693 Immune thrombocytopenic purpura: Secondary | ICD-10-CM

## 2015-12-08 MED ORDER — IMMUNE GLOBULIN (HUMAN) 10 GM/100ML IV SOLN: 2.0000 g/kg | Freq: Once | INTRAVENOUS | Status: DC

## 2015-12-08 MED ORDER — ACETAMINOPHEN 325 MG PO TABS
650.0000 mg | ORAL_TABLET | Freq: Once | ORAL | Status: AC
  Administered 2015-12-08: 650 mg via ORAL

## 2015-12-08 MED ORDER — ACETAMINOPHEN 325 MG PO TABS
ORAL_TABLET | ORAL | Status: AC
  Filled 2015-12-08: qty 2

## 2015-12-08 MED ORDER — DIPHENHYDRAMINE HCL 25 MG PO CAPS
25.0000 mg | ORAL_CAPSULE | Freq: Once | ORAL | Status: AC
  Administered 2015-12-08: 25 mg via ORAL

## 2015-12-08 MED ORDER — PRIVIGEN 10 GM/100ML IV SOLN
80.0000 g | Freq: Once | INTRAVENOUS | Status: AC
  Administered 2015-12-08: 80 g via INTRAVENOUS
  Filled 2015-12-08: qty 800

## 2015-12-08 MED ORDER — DIPHENHYDRAMINE HCL 25 MG PO CAPS
ORAL_CAPSULE | ORAL | Status: AC
  Filled 2015-12-08: qty 1

## 2015-12-08 NOTE — Patient Instructions (Signed)

## 2015-12-11 ENCOUNTER — Telehealth: Payer: Self-pay

## 2015-12-11 ENCOUNTER — Emergency Department (HOSPITAL_COMMUNITY): Payer: BLUE CROSS/BLUE SHIELD

## 2015-12-11 ENCOUNTER — Inpatient Hospital Stay (HOSPITAL_COMMUNITY)
Admission: EM | Admit: 2015-12-11 | Discharge: 2015-12-16 | DRG: 813 | Disposition: A | Discharge status: Home / Self Care | Payer: BLUE CROSS/BLUE SHIELD | Attending: Internal Medicine | Admitting: Internal Medicine

## 2015-12-11 ENCOUNTER — Other Ambulatory Visit: Payer: Self-pay

## 2015-12-11 ENCOUNTER — Encounter (HOSPITAL_COMMUNITY): Payer: Self-pay | Admitting: Emergency Medicine

## 2015-12-11 DIAGNOSIS — R040 Epistaxis: Secondary | ICD-10-CM | POA: Diagnosis present

## 2015-12-11 DIAGNOSIS — Z833 Family history of diabetes mellitus: Secondary | ICD-10-CM

## 2015-12-11 DIAGNOSIS — D649 Anemia, unspecified: Secondary | ICD-10-CM

## 2015-12-11 DIAGNOSIS — J302 Other seasonal allergic rhinitis: Secondary | ICD-10-CM | POA: Diagnosis present

## 2015-12-11 DIAGNOSIS — Z823 Family history of stroke: Secondary | ICD-10-CM | POA: Diagnosis not present

## 2015-12-11 DIAGNOSIS — T380X5A Adverse effect of glucocorticoids and synthetic analogues, initial encounter: Secondary | ICD-10-CM | POA: Diagnosis present

## 2015-12-11 DIAGNOSIS — D638 Anemia in other chronic diseases classified elsewhere: Secondary | ICD-10-CM | POA: Diagnosis present

## 2015-12-11 DIAGNOSIS — Z87891 Personal history of nicotine dependence: Secondary | ICD-10-CM | POA: Diagnosis not present

## 2015-12-11 DIAGNOSIS — Z79899 Other long term (current) drug therapy: Secondary | ICD-10-CM | POA: Diagnosis not present

## 2015-12-11 DIAGNOSIS — F419 Anxiety disorder, unspecified: Secondary | ICD-10-CM | POA: Diagnosis present

## 2015-12-11 DIAGNOSIS — I10 Essential (primary) hypertension: Secondary | ICD-10-CM | POA: Diagnosis not present

## 2015-12-11 DIAGNOSIS — D72829 Elevated white blood cell count, unspecified: Secondary | ICD-10-CM | POA: Diagnosis present

## 2015-12-11 DIAGNOSIS — Z9081 Acquired absence of spleen: Secondary | ICD-10-CM

## 2015-12-11 DIAGNOSIS — D696 Thrombocytopenia, unspecified: Secondary | ICD-10-CM | POA: Diagnosis present

## 2015-12-11 DIAGNOSIS — K219 Gastro-esophageal reflux disease without esophagitis: Secondary | ICD-10-CM | POA: Diagnosis present

## 2015-12-11 DIAGNOSIS — D500 Iron deficiency anemia secondary to blood loss (chronic): Secondary | ICD-10-CM

## 2015-12-11 DIAGNOSIS — D693 Immune thrombocytopenic purpura: Secondary | ICD-10-CM | POA: Diagnosis present

## 2015-12-11 DIAGNOSIS — E785 Hyperlipidemia, unspecified: Secondary | ICD-10-CM | POA: Diagnosis present

## 2015-12-11 DIAGNOSIS — K068 Other specified disorders of gingiva and edentulous alveolar ridge: Secondary | ICD-10-CM | POA: Diagnosis present

## 2015-12-11 DIAGNOSIS — F329 Major depressive disorder, single episode, unspecified: Secondary | ICD-10-CM | POA: Diagnosis present

## 2015-12-11 DIAGNOSIS — Z6828 Body mass index (BMI) 28.0-28.9, adult: Secondary | ICD-10-CM | POA: Diagnosis not present

## 2015-12-11 DIAGNOSIS — K055 Other periodontal diseases: Secondary | ICD-10-CM | POA: Diagnosis not present

## 2015-12-11 LAB — LACTATE DEHYDROGENASE: LDH: 351 U/L — ABNORMAL HIGH (ref 98–192)

## 2015-12-11 LAB — COMPREHENSIVE METABOLIC PANEL
ALT: 31 U/L (ref 17–63)
ANION GAP: 9 (ref 5–15)
AST: 47 U/L — ABNORMAL HIGH (ref 15–41)
Albumin: 3.6 g/dL (ref 3.5–5.0)
Alkaline Phosphatase: 82 U/L (ref 38–126)
BUN: 22 mg/dL — ABNORMAL HIGH (ref 6–20)
CHLORIDE: 106 mmol/L (ref 101–111)
CO2: 23 mmol/L (ref 22–32)
CREATININE: 0.86 mg/dL (ref 0.61–1.24)
Calcium: 8.6 mg/dL — ABNORMAL LOW (ref 8.9–10.3)
Glucose, Bld: 78 mg/dL (ref 65–99)
Potassium: 3.8 mmol/L (ref 3.5–5.1)
SODIUM: 138 mmol/L (ref 135–145)
Total Bilirubin: 1.3 mg/dL — ABNORMAL HIGH (ref 0.3–1.2)
Total Protein: 7.6 g/dL (ref 6.5–8.1)

## 2015-12-11 LAB — CBC WITH DIFFERENTIAL/PLATELET
Basophils Absolute: 0 10*3/uL (ref 0.0–0.1)
Basophils Relative: 0 %
EOS ABS: 0 10*3/uL (ref 0.0–0.7)
EOS PCT: 0 %
HCT: 26.1 % — ABNORMAL LOW (ref 39.0–52.0)
HEMOGLOBIN: 8.4 g/dL — AB (ref 13.0–17.0)
LYMPHS PCT: 7 %
Lymphs Abs: 1 10*3/uL (ref 0.7–4.0)
MCH: 27.9 pg (ref 26.0–34.0)
MCHC: 32.2 g/dL (ref 30.0–36.0)
MCV: 86.7 fL (ref 78.0–100.0)
MONOS PCT: 2 %
Monocytes Absolute: 0.3 10*3/uL (ref 0.1–1.0)
NRBC: 1 /100{WBCs} — AB
Neutro Abs: 12.8 10*3/uL — ABNORMAL HIGH (ref 1.7–7.7)
Neutrophils Relative %: 91 %
RBC: 3.01 MIL/uL — ABNORMAL LOW (ref 4.22–5.81)
RDW: 18.7 % — AB (ref 11.5–15.5)
WBC: 14.1 10*3/uL — ABNORMAL HIGH (ref 4.0–10.5)

## 2015-12-11 LAB — CBC
HEMATOCRIT: 29.4 % — AB (ref 39.0–52.0)
HEMOGLOBIN: 9.2 g/dL — AB (ref 13.0–17.0)
MCH: 27.5 pg (ref 26.0–34.0)
MCHC: 31.3 g/dL (ref 30.0–36.0)
MCV: 88 fL (ref 78.0–100.0)
RBC: 3.34 MIL/uL — AB (ref 4.22–5.81)
RDW: 18.7 % — ABNORMAL HIGH (ref 11.5–15.5)
WBC: 8.3 10*3/uL (ref 4.0–10.5)

## 2015-12-11 LAB — SAVE SMEAR

## 2015-12-11 LAB — DIRECT ANTIGLOBULIN TEST (NOT AT ARMC)
DAT, IGG: POSITIVE
DAT, complement: NEGATIVE

## 2015-12-11 LAB — PROTIME-INR
INR: 1.17 (ref 0.00–1.49)
Prothrombin Time: 15 seconds (ref 11.6–15.2)

## 2015-12-11 LAB — VITAMIN B12: Vitamin B-12: 387 pg/mL (ref 180–914)

## 2015-12-11 LAB — POC OCCULT BLOOD, ED: Fecal Occult Bld: POSITIVE — AB

## 2015-12-11 LAB — RAPID URINE DRUG SCREEN, HOSP PERFORMED
Amphetamines: NOT DETECTED
BENZODIAZEPINES: NOT DETECTED
Barbiturates: NOT DETECTED
COCAINE: NOT DETECTED
OPIATES: NOT DETECTED
TETRAHYDROCANNABINOL: NOT DETECTED

## 2015-12-11 LAB — APTT: APTT: 28 s (ref 24–37)

## 2015-12-11 MED ORDER — DEXAMETHASONE SODIUM PHOSPHATE 4 MG/ML IJ SOLN
20.0000 mg | Freq: Every day | INTRAMUSCULAR | Status: DC
  Administered 2015-12-12: 20 mg via INTRAVENOUS
  Filled 2015-12-11 (×4): qty 5

## 2015-12-11 MED ORDER — ADULT MULTIVITAMIN W/MINERALS CH
1.0000 | ORAL_TABLET | Freq: Every morning | ORAL | Status: DC
  Administered 2015-12-12 – 2015-12-16 (×5): 1 via ORAL
  Filled 2015-12-11 (×5): qty 1

## 2015-12-11 MED ORDER — AMLODIPINE BESYLATE 10 MG PO TABS
10.0000 mg | ORAL_TABLET | Freq: Every day | ORAL | Status: DC
  Administered 2015-12-13 – 2015-12-16 (×4): 10 mg via ORAL
  Filled 2015-12-11 (×5): qty 1

## 2015-12-11 MED ORDER — IRBESARTAN 150 MG PO TABS
300.0000 mg | ORAL_TABLET | Freq: Every day | ORAL | Status: DC
  Administered 2015-12-13 – 2015-12-16 (×4): 300 mg via ORAL
  Filled 2015-12-11 (×3): qty 1
  Filled 2015-12-11: qty 2
  Filled 2015-12-11 (×2): qty 1
  Filled 2015-12-11: qty 2

## 2015-12-11 MED ORDER — SODIUM CHLORIDE 0.9 % IV SOLN
10.0000 mL/h | Freq: Once | INTRAVENOUS | Status: AC
  Administered 2015-12-11: 10 mL/h via INTRAVENOUS

## 2015-12-11 MED ORDER — DEXAMETHASONE SODIUM PHOSPHATE 10 MG/ML IJ SOLN
20.0000 mg | Freq: Once | INTRAMUSCULAR | Status: AC
  Administered 2015-12-11: 20 mg via INTRAVENOUS
  Filled 2015-12-11: qty 2

## 2015-12-11 MED ORDER — ATORVASTATIN CALCIUM 10 MG PO TABS
20.0000 mg | ORAL_TABLET | Freq: Every day | ORAL | Status: DC
  Administered 2015-12-12 – 2015-12-15 (×4): 20 mg via ORAL
  Filled 2015-12-11: qty 2
  Filled 2015-12-11: qty 1
  Filled 2015-12-11 (×3): qty 2

## 2015-12-11 MED ORDER — FOLIC ACID 1 MG PO TABS
1.0000 mg | ORAL_TABLET | Freq: Every day | ORAL | Status: DC
  Administered 2015-12-12 – 2015-12-16 (×5): 1 mg via ORAL
  Filled 2015-12-11 (×5): qty 1

## 2015-12-11 MED ORDER — AMLODIPINE-OLMESARTAN 10-40 MG PO TABS: 1.0000 | ORAL_TABLET | Freq: Every morning | ORAL | Status: DC

## 2015-12-11 MED ORDER — POTASSIUM CHLORIDE CRYS ER 20 MEQ PO TBCR
20.0000 meq | EXTENDED_RELEASE_TABLET | Freq: Every day | ORAL | Status: DC
  Administered 2015-12-12 – 2015-12-16 (×5): 20 meq via ORAL
  Filled 2015-12-11 (×5): qty 1

## 2015-12-11 MED ORDER — ROMIPLOSTIM 250 MCG ~~LOC~~ SOLR
5.0000 ug/kg | Freq: Once | SUBCUTANEOUS | Status: AC
  Administered 2015-12-11: 425 ug via SUBCUTANEOUS
  Filled 2015-12-11: qty 0.85

## 2015-12-11 MED ORDER — SIMVASTATIN 40 MG PO TABS: 40.0000 mg | ORAL_TABLET | Freq: Every day | ORAL | Status: DC

## 2015-12-11 MED ORDER — AMINOCAPROIC ACID 500 MG PO TABS
1.0000 g | ORAL_TABLET | Freq: Four times a day (QID) | ORAL | Status: AC
  Administered 2015-12-11 – 2015-12-13 (×7): 1 g via ORAL
  Filled 2015-12-11 (×11): qty 2

## 2015-12-11 MED ORDER — PANTOPRAZOLE SODIUM 40 MG PO TBEC
40.0000 mg | DELAYED_RELEASE_TABLET | Freq: Every day | ORAL | Status: DC
  Administered 2015-12-12 – 2015-12-16 (×5): 40 mg via ORAL
  Filled 2015-12-11 (×5): qty 1

## 2015-12-11 MED ORDER — LORATADINE 10 MG PO TABS
10.0000 mg | ORAL_TABLET | Freq: Every day | ORAL | Status: DC
Start: 2015-12-11 — End: 2015-12-16
  Administered 2015-12-12 – 2015-12-16 (×5): 10 mg via ORAL
  Filled 2015-12-11 (×5): qty 1

## 2015-12-11 MED ORDER — SENNOSIDES-DOCUSATE SODIUM 8.6-50 MG PO TABS
1.0000 | ORAL_TABLET | Freq: Two times a day (BID) | ORAL | Status: DC
  Administered 2015-12-12 – 2015-12-16 (×9): 1 via ORAL
  Filled 2015-12-11 (×9): qty 1

## 2015-12-11 MED ORDER — FLUTICASONE PROPIONATE 50 MCG/ACT NA SUSP
1.0000 | Freq: Every day | NASAL | Status: DC
  Administered 2015-12-12 – 2015-12-16 (×5): 1 via NASAL
  Filled 2015-12-11: qty 16

## 2015-12-11 NOTE — Telephone Encounter (Signed)
Received call from pt reporting bleeding from gums and lips seem to have worsened over the weekend despite receiving IVIG on Friday. Pt also states he is now coughing up bright red blood that "might be" mixed with sputum. Predominant color is bright red. Pt does not believe his SOB has worsened and does not feel any more fatigued than he has. Pt is aware that Dr [name] is out of the office today.   Discussed with Judson Roch, NP who recommends pt go to ED for assessment. This information left on pt's VM per his request. dph

## 2015-12-11 NOTE — ED Notes (Signed)
Pt is aware we need urine.  

## 2015-12-11 NOTE — H&P (Signed)
History and Physical  Scott Waters [id number] DOB: Sep 14, 1956 DOA: 12/11/2015  Referring physician: EDP PCP: [name], MD   Chief Complaint: low platelet  HPI: Scott Waters is a 60 y.o. male   H/o refractory ITP s/p splenectomy, last dose of IVIG on 1/13, presented to [facility] ED due to c/o nose bleed this am, on arrival to the ED, patient has no active bleed, but cbc showed plt less than 5, EDP paged hematology who recommended  plt and decadron and admission to [facility].  Patient currently is otherwise stable, denies pain, no headache, no n/v, no cough, no fever, no constipation, no diarrhea, vital stable.  Review of Systems:  Detail per HPI, Review of systems are otherwise negative  Past Medical History  Diagnosis Date  . Seasonal allergies   . Hypertension   . Hyperlipidemia   . Hx of colonic polyps 2007  . Gout 04/27/2011    One episode of joint pain at [facility]   Presumed    . Abnormal LFTs 04/27/2011  . Thrombocytopenia ([place]) 04/15/2014    admitted for IVIG  . GERD (gastroesophageal reflux disease)     "mild"  . Osteoarthritis   . Arthritis     "knees" (04/16/2014)  . 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 Lower leg edema   . Anxiety   . Depression   . Urinary frequency   . H/O splenectomy    Past Surgical History  Procedure Laterality Date  . Polypectomy    . Multiple tooth extractions  2014    "took out all the top teeth"  . Tonsillectomy and adenoidectomy  1973  . Laceration repair Right ~ 1983    "inner knee; steel tubing fell on me"  . Shoulder arthroscopy Left 08/2001    "removed bone spur"  . Laparoscopic splenectomy N/A 11/08/2014    Procedure: LAPAROSCOPIC SPLENECTOMY;  Surgeon: Fanny Skates, MD;  Location: [facility];  Service: General;  Laterality: N/A;  . Laparotomy N/A 11/14/2014    Procedure: EXPLORATORY LAPAROTOMY, RELEASE SMALL BOWEL OBSTRUCTION, INCISIONAL HERNIA REPAIR;  Surgeon: Fanny Skates, MD;  Location: [facility];  Service:  General;  Laterality: N/A;   Social History:  reports that he quit smoking about 12 years ago. His smoking use included Cigarettes. He started smoking about 45 years ago. He has a 33 pack-year smoking history. He quit smokeless tobacco use about 12 years ago. His smokeless tobacco use included Snuff and Chew. He reports that he drinks about 16.8 oz of alcohol per week. He reports that he uses illicit drugs. Patient lives at home& is able to participate in activities of daily living independently , work:loading trucks  No Known Allergies  Family History  Problem Relation Age of Onset  . Colon cancer Father 38    Died at 43  . Hypertension Mother   . Diabetes Mother   . Kidney disease Mother     diailysis from dm   . Stroke        Prior to Admission medications   Medication Sig Start Date End Date Taking? Authorizing Provider  amLODipine-olmesartan (AZOR) 10-40 MG per tablet Take 1 tablet by mouth every morning.    Yes Historical Provider, MD  CINNAMON PO Take 2 capsules by mouth daily.   Yes Historical Provider, MD  fexofenadine (ALLEGRA) 180 MG tablet Take 180 mg by mouth daily as needed for allergies.    Yes Historical Provider, MD  fluticasone (FLONASE) 50 MCG/ACT nasal spray instill 2 sprays into each nostril once  daily Patient taking differently: instill 2 sprays into each nostril daily as needed for allergies 11/24/14  Yes Burnis Medin, MD  folic acid (FOLVITE) 1 MG tablet Take 1 tablet (1 mg total) by mouth daily. 07/12/15  Yes [name], MD  Multiple Vitamin (MULTIVITAMIN WITH MINERALS) TABS tablet Take 1 tablet by mouth every morning.    Yes Historical Provider, MD  pantoprazole (PROTONIX) 40 MG tablet Take 1 tablet (40 mg total) by mouth daily. 05/23/14  Yes [name], MD  potassium chloride (K-DUR,KLOR-CON) 10 MEQ tablet take 2 tablets by mouth once daily 05/31/15  Yes Burnis Medin, MD  Probiotic Product (PROBIOTIC DAILY PO) Take 1 tablet by mouth 2 (two) times a  week.    Yes Historical Provider, MD  simvastatin (ZOCOR) 40 MG tablet Take 40 mg by mouth daily at 6 PM.  08/25/15  Yes Historical Provider, MD    Physical Exam: BP 128/83 mmHg  Pulse 89  Temp(Src) 98.4 F (36.9 C) (Oral)  Resp 18  Ht 5\' 8"  (1.727 m)  Wt 85.276 kg (188 lb)  BMI 28.59 kg/m2  SpO2 100%  General:  NAD, no active bleed Eyes: PERRL ENT: unremarkable, bruising left lower lip, not active bleed Neck: supple, no JVD Cardiovascular: RRR Respiratory: CTABL Abdomen: soft/ND/ND, positive bowel sounds Skin: no rash Musculoskeletal:  No edema Psychiatric: calm/cooperative Neurologic: no focal findings            Labs on Admission:  Basic Metabolic Panel:  Recent Labs Lab 12/11/15 1422  NA 138  K 3.8  CL 106  CO2 23  GLUCOSE 78  BUN 22*  CREATININE 0.86  CALCIUM 8.6*   Liver Function Tests:  Recent Labs Lab 12/11/15 1422  AST 47*  ALT 31  ALKPHOS 82  BILITOT 1.3*  PROT 7.6  ALBUMIN 3.6   No results for input(s): LIPASE, AMYLASE in the last 168 hours. No results for input(s): AMMONIA in the last 168 hours. CBC:  Recent Labs Lab 12/06/15 1434 12/11/15 1422  WBC 6.4 8.3  NEUTROABS 3.9  --   HGB 9.9* 9.2*  HCT 31.4* 29.4*  MCV 86 88.0  PLT 7* <5*   Cardiac Enzymes: No results for input(s): CKTOTAL, CKMB, CKMBINDEX, TROPONINI in the last 168 hours.  BNP (last 3 results) No results for input(s): BNP in the last 8760 hours.  ProBNP (last 3 results) No results for input(s): PROBNP in the last 8760 hours.  CBG: No results for input(s): GLUCAP in the last 168 hours.  Radiological Exams on Admission: Dg Chest 2 View  12/11/2015  CLINICAL DATA:  Patient diagnosed with pneumonia 2 months ago. Continued cough. EXAM: CHEST  2 VIEW COMPARISON:  PA and lateral chest 09/18/2015. FINDINGS: Right basilar airspace disease and small right effusion have improved with only small effusion persisting. The left lung is clear. Heart size is mildly enlarged.  No pneumothorax. IMPRESSION: Marked improvement in right effusion and basilar airspace disease. Cardiomegaly. Electronically Signed   By: Inge Rise M.D.   On: 12/11/2015 14:35    NSR on tele  Assessment/Plan Present on Admission:  . Thrombocytopenia (HCC)  Refractory ITP, plt less than 5, admit to stepdown, plt /steroid. Bed rest, Hematology /oncology consulted.  Nose bleed/gum bleed, currently none, hematology started amicar   +FOBT from nose/gum bleed vs gi bleed.   Anemia: no indication for prbc transfusion, hematology/onc following  HTN: continue home meds.   DVT prophylaxis: scd's  Consultants: hematology/oncology  Code Status:  full   Family Communication:  Patient   Disposition Plan: admit to stepdown  Time spent: 8mins  Danialle Dement MD, PhD [facility] Pager [phone] If 7PM-7AM, please contact night-coverage at [email or web address], password [facility]

## 2015-12-11 NOTE — ED Provider Notes (Signed)
CSN: [id number]     Arrival date & time 12/11/15  1338 History   First MD Initiated Contact with Patient 12/11/15 1600     Chief Complaint  Patient presents with  . Hemoptysis  . Thrombocytopenia      (Consider location/radiation/quality/duration/timing/severity/associated sxs/prior Treatment) HPI  60 year old male presents with a chief complaint of bleeding. Patient has chronic ITP and most recently received an IVIG infusion 3 days ago. At that time his platelets were 7000. Patient states over this past weekend he has had more bleeding than normal from his gums and nose. Occasionally has bit his lip and caused bleeding. No headaches. Got over pneumonia a few weeks ago, still coughs with occasional blood. No bright blood in stool but has noticed dark stools over past 3 days. No chest or abdominal pain, no dyspnea.  Past Medical History  Diagnosis Date  . Seasonal allergies   . Hypertension   . Hyperlipidemia   . Hx of colonic polyps 2007  . Gout 04/27/2011    One episode of joint pain at [facility]   Presumed    . Abnormal LFTs 04/27/2011  . Thrombocytopenia ([place]) 04/15/2014    admitted for IVIG  . GERD (gastroesophageal reflux disease)     "mild"  . Osteoarthritis   . Arthritis     "knees" (04/16/2014)  . 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 Lower leg edema   . Anxiety   . Depression   . Urinary frequency   . H/O splenectomy    Past Surgical History  Procedure Laterality Date  . Polypectomy    . Multiple tooth extractions  2014    "took out all the top teeth"  . Tonsillectomy and adenoidectomy  1973  . Laceration repair Right ~ 1983    "inner knee; steel tubing fell on me"  . Shoulder arthroscopy Left 08/2001    "removed bone spur"  . Laparoscopic splenectomy N/A 11/08/2014    Procedure: LAPAROSCOPIC SPLENECTOMY;  Surgeon: Fanny Skates, MD;  Location: [facility];  Service: General;  Laterality: N/A;  . Laparotomy N/A 11/14/2014    Procedure: EXPLORATORY  LAPAROTOMY, RELEASE SMALL BOWEL OBSTRUCTION, INCISIONAL HERNIA REPAIR;  Surgeon: Fanny Skates, MD;  Location: [facility];  Service: General;  Laterality: N/A;   Family History  Problem Relation Age of Onset  . Colon cancer Father 38    Died at 77  . Hypertension Mother   . Diabetes Mother   . Kidney disease Mother     diailysis from dm   . Stroke     Social History  Substance Use Topics  . Smoking status: Former Smoker -- 1.00 packs/day for 33 years    Types: Cigarettes    Start date: 10/02/1970    Quit date: 01/02/2003  . Smokeless tobacco: Former Systems developer    Types: Snuff, Sarina Ser    Quit date: 10/03/2003     Comment: quit 11 years ago  . Alcohol Use: 16.8 oz/week    14 Cans of beer, 14 Shots of liquor per week     Comment: 2 beers daily, 2 shots liquor daily     Review of Systems  Constitutional: Negative for fever.  HENT: Positive for nosebleeds.   Respiratory: Positive for cough. Negative for shortness of breath.   Cardiovascular: Negative for chest pain.  Gastrointestinal: Negative for abdominal pain.  Neurological: Negative for headaches.  All other systems reviewed and are negative.     Allergies  Review of patient's allergies indicates no known allergies.  Home Medications   Prior to Admission medications   Medication Sig Start Date End Date Taking? Authorizing Provider  amLODipine-olmesartan (AZOR) 10-40 MG per tablet Take 1 tablet by mouth every morning.    Yes Historical Provider, MD  CINNAMON PO Take 2 capsules by mouth daily.   Yes Historical Provider, MD  fexofenadine (ALLEGRA) 180 MG tablet Take 180 mg by mouth daily as needed for allergies.    Yes Historical Provider, MD  fluticasone (FLONASE) 50 MCG/ACT nasal spray instill 2 sprays into each nostril once daily Patient taking differently: instill 2 sprays into each nostril daily as needed for allergies 11/24/14  Yes Burnis Medin, MD  folic acid (FOLVITE) 1 MG tablet Take 1 tablet (1 mg total) by mouth  daily. 07/12/15  Yes [name], MD  Multiple Vitamin (MULTIVITAMIN WITH MINERALS) TABS tablet Take 1 tablet by mouth every morning.    Yes Historical Provider, MD  pantoprazole (PROTONIX) 40 MG tablet Take 1 tablet (40 mg total) by mouth daily. 05/23/14  Yes [name], MD  potassium chloride (K-DUR,KLOR-CON) 10 MEQ tablet take 2 tablets by mouth once daily 05/31/15  Yes Burnis Medin, MD  Probiotic Product (PROBIOTIC DAILY PO) Take 1 tablet by mouth 2 (two) times a week.    Yes Historical Provider, MD  simvastatin (ZOCOR) 40 MG tablet Take 40 mg by mouth daily at 6 PM.  08/25/15  Yes Historical Provider, MD   BP 120/82 mmHg  Pulse 99  Temp(Src) 98.2 F (36.8 C) (Oral)  Resp 18  SpO2 99% Physical Exam  Constitutional: He is oriented to person, place, and time. He appears well-developed and well-nourished.  HENT:  Head: Normocephalic and atraumatic.  Right Ear: External ear normal.  Left Ear: External ear normal.  Nose: Nose normal.  Ecchymosis to inner lateral lower left lip. Small ecchymosis to right inner cheek. No active gingival or nasal bleeding  Eyes: Right eye exhibits no discharge. Left eye exhibits no discharge.  Neck: Neck supple.  Cardiovascular: Normal rate, regular rhythm, normal heart sounds and intact distal pulses.   Pulmonary/Chest: Effort normal and breath sounds normal.  Abdominal: Soft. He exhibits no distension. There is no tenderness.  Musculoskeletal: He exhibits no edema.  Neurological: He is alert and oriented to person, place, and time.  Skin: Skin is warm and dry. He is not diaphoretic.  Nursing note and vitals reviewed.   ED Course  Procedures (including critical care time) Labs Review Labs Reviewed  COMPREHENSIVE METABOLIC PANEL - Abnormal; Notable for the following:    BUN 22 (*)    Calcium 8.6 (*)    AST 47 (*)    Total Bilirubin 1.3 (*)    All other components within normal limits  CBC - Abnormal; Notable for the following:    RBC 3.34  (*)    Hemoglobin 9.2 (*)    HCT 29.4 (*)    RDW 18.7 (*)    Platelets <5 (*)    All other components within normal limits    Imaging Review Dg Chest 2 View  12/11/2015  CLINICAL DATA:  Patient diagnosed with pneumonia 2 months ago. Continued cough. EXAM: CHEST  2 VIEW COMPARISON:  PA and lateral chest 09/18/2015. FINDINGS: Right basilar airspace disease and small right effusion have improved with only small effusion persisting. The left lung is clear. Heart size is mildly enlarged. No pneumothorax. IMPRESSION: Marked improvement in right effusion and basilar airspace disease. Cardiomegaly. Electronically Signed   By: Marcello Moores  Dalessio M.D.   On: 12/11/2015 14:35   I have personally reviewed and evaluated these images and lab results as part of my medical decision-making.   EKG Interpretation None      MDM   Final diagnoses:  ITP (idiopathic thrombocytopenic purpura)    Discussed with hem/onc on call, Dr. Irene Limbo, who recommends decadron 20 mg IV now, and 1 unit of platelets after with recheck of plt count 1 hour later. Patient appears well, no active bleeding now or concerning findings such as headache or dyspnea. Admit to hospitalist.    Sherwood Gambler, MD 12/12/15 [phone]

## 2015-12-11 NOTE — ED Notes (Signed)
Bed: WA01 Expected date:  Expected time:  Means of arrival:  Comments: TRIAGE 2

## 2015-12-11 NOTE — ED Notes (Signed)
Received call from [place], in lab.  Critical platelet count of <5.

## 2015-12-11 NOTE — ED Notes (Signed)
Heart healthy given to patient

## 2015-12-11 NOTE — ED Notes (Signed)
Consent form at bedside.  

## 2015-12-11 NOTE — ED Notes (Signed)
Pt reports coughing up blood for a month and bleeding gums. Called PCP and told to come here for evaluation; hx of thrombocytopenia and platelet transfusion last week.

## 2015-12-11 NOTE — ED Notes (Signed)
[place] at bedside.

## 2015-12-11 NOTE — Consult Note (Addendum)
Marland Kitchen    HEMATOLOGY/ONCOLOGY CONSULTATION NOTE  Date of Service: 12/11/2015  Patient Care Team: Burnis Medin, MD as PCP - General Irene Shipper, MD as Attending Physician (Gastroenterology) [name], MD as Consulting Physician (Oncology)  CHIEF COMPLAINTS/PURPOSE OF CONSULTATION:  thrombocytopenia  HISTORY OF PRESENTING ILLNESS:  Scott Waters is a wonderful 60 y.o. male who follows with Dr. Lattie Haw for apparent refractory immune thrombocytopenia. We were consulted from the emergency room by Dr. Regenia Skeeter for management of worsening thrombocytopenia with platelet counts of less than 5k.  Patient was apparently diagnosed with ITP around June 2015 and has had extensive treatments including high-dose steroids, splenectomy, Rituxan (in oct 2015), Nplate, Promacta, IVIG on several occasions.  He has had refractory disease with poor response to most of the above mentioned first tier treatments.  Most recently he was on Promacta 75 mg by mouth daily which was not found to be very useful and was discontinued about a week ago. He has been getting managed with IVIG as needed.  His platelets today were noted to be less than 5k and he notes spontaneous gum bleeding, bleeding from the left part of the lower lid related to minor bite and the right inner cheek. He also notes that his stools have been somewhat darker recently.stool occult blood was positive today as well.his platelet counts have been 7000 about 5 days ago and less than 6000. 2-3 weeks ago.he received 80 g of IVIG on 11/21/2015, 11/14/2015 and 12/08/2015. Patient notes that he has used Amicar in the past bleeding from his gums. He appears to be having gradually dropping hemoglobin from 10.9 about 3 weeks ago now progressively down to 9.2.  No headaches, focal neurological deficits. No overt skin hematomas. No swollen joints.   MEDICAL HISTORY:  Past Medical History  Diagnosis Date  . Seasonal allergies   . Hypertension   .  Hyperlipidemia   . Hx of colonic polyps 2007  . Gout 04/27/2011    One episode of joint pain at [facility]   Presumed    . Abnormal LFTs 04/27/2011  . Thrombocytopenia ([place]) 04/15/2014    admitted for IVIG  . GERD (gastroesophageal reflux disease)     "mild"  . Osteoarthritis   . Arthritis     "knees" (04/16/2014)  . 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 Lower leg edema   . Anxiety   . Depression   . Urinary frequency   . H/O splenectomy     SURGICAL HISTORY: Past Surgical History  Procedure Laterality Date  . Polypectomy    . Multiple tooth extractions  2014    "took out all the top teeth"  . Tonsillectomy and adenoidectomy  1973  . Laceration repair Right ~ 1983    "inner knee; steel tubing fell on me"  . Shoulder arthroscopy Left 08/2001    "removed bone spur"  . Laparoscopic splenectomy N/A 11/08/2014    Procedure: LAPAROSCOPIC SPLENECTOMY;  Surgeon: Fanny Skates, MD;  Location: [facility];  Service: General;  Laterality: N/A;  . Laparotomy N/A 11/14/2014    Procedure: EXPLORATORY LAPAROTOMY, RELEASE SMALL BOWEL OBSTRUCTION, INCISIONAL HERNIA REPAIR;  Surgeon: Fanny Skates, MD;  Location: [facility];  Service: General;  Laterality: N/A;    SOCIAL HISTORY: Social History   Social History  . Marital Status: Divorced    Spouse Name: N/A  . Number of Children: N/A  . Years of Education: N/A   Occupational History  . Not on file.   Social History  Main Topics  . Smoking status: Former Smoker -- 1.00 packs/day for 33 years    Types: Cigarettes    Start date: 10/02/1970    Quit date: 01/02/2003  . Smokeless tobacco: Former Systems developer    Types: Snuff, Sarina Ser    Quit date: 10/03/2003     Comment: quit 11 years ago  . Alcohol Use: 16.8 oz/week    14 Cans of beer, 14 Shots of liquor per week     Comment: 2 beers daily, 2 shots liquor daily   . Drug Use: Yes     Comment: "last drug use was in ~ 1995; marijuana"  . Sexual Activity: Yes   Other Topics Concern  . 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 Colon cancer Father 73    Died at 46  . Hypertension Mother   . Diabetes Mother   . Kidney disease Mother     diailysis from dm   . Stroke      ALLERGIES:  has No Known Allergies.  MEDICATIONS:  Current Facility-Administered Medications  Medication Dose Route Frequency Provider Last Rate Last Dose  . aminocaproic acid (AMICAR) tablet 1 g  1 g Oral 4 times per day Brunetta Genera, MD      . Derrill Memo ON 12/12/2015] amLODipine-olmesartan (AZOR) 10-40 MG per tablet 1 tablet  1 tablet Oral q morning - 10a Florencia Reasons, MD      . fluticasone (FLONASE) 50 MCG/ACT nasal spray 1 spray  1 spray Each Nare Daily Florencia Reasons, MD   1 spray at 12/11/15 1820  . folic acid (FOLVITE) tablet 1 mg  1 mg Oral Daily Florencia Reasons, MD   1 mg at 12/11/15 1818  . loratadine (CLARITIN) tablet 10 mg  10 mg Oral Daily Florencia Reasons, MD   10 mg at 12/11/15 1817  . [START ON 12/12/2015] multivitamin with minerals tablet 1 tablet  1 tablet Oral q morning - 10a Florencia Reasons, MD      . pantoprazole (PROTONIX) EC tablet 40 mg  40 mg Oral Daily Florencia Reasons, MD   40 mg at 12/11/15 1819  . potassium chloride SA (K-DUR,KLOR-CON) CR tablet 20 mEq  20 mEq Oral Daily Florencia Reasons, MD   20 mEq at 12/11/15 1819  . romiPLOStim (NPLATE) injection 425 mcg  5 mcg/kg Subcutaneous Once Brunetta Genera, MD      . senna-docusate (Senokot-S) tablet 1 tablet  1 tablet Oral BID Florencia Reasons, MD      . Derrill Memo ON 12/12/2015] simvastatin (ZOCOR) tablet 40 mg  40 mg Oral q1800 Florencia Reasons, MD       Current Outpatient Prescriptions  Medication Sig Dispense Refill  . amLODipine-olmesartan (AZOR) 10-40 MG per tablet Take 1 tablet by mouth every morning.     Marland Kitchen CINNAMON PO Take 2 capsules by mouth daily.    .  fexofenadine (ALLEGRA) 180 MG tablet Take 180 mg by mouth daily as needed for allergies.     . fluticasone (FLONASE) 50 MCG/ACT nasal spray instill 2 sprays into each nostril once daily (Patient taking differently: instill 2 sprays into each nostril daily as needed for allergies)  16 g 5  . folic acid (FOLVITE) 1 MG tablet Take 1 tablet (1 mg total) by mouth daily. 30 tablet 6  . Multiple Vitamin (MULTIVITAMIN WITH MINERALS) TABS tablet Take 1 tablet by mouth every morning.     . pantoprazole (PROTONIX) 40 MG tablet Take 1 tablet (40 mg total) by mouth daily. 30 tablet 6  . potassium chloride (K-DUR,KLOR-CON) 10 MEQ tablet take 2 tablets by mouth once daily 60 tablet 10  . Probiotic Product (PROBIOTIC DAILY PO) Take 1 tablet by mouth 2 (two) times a week.     . simvastatin (ZOCOR) 40 MG tablet Take 40 mg by mouth daily at 6 PM.   1   Facility-Administered Medications Ordered in Other Encounters  Medication Dose Route Frequency Provider Last Rate Last Dose  . famotidine (PEPCID) IVPB 20 mg premix  20 mg Intravenous Q12H [name], MD   20 mg at 09/06/15 1136    REVIEW OF SYSTEMS:    10 Point review of Systems was done is negative except as noted above.  PHYSICAL EXAMINATION: ECOG PERFORMANCE STATUS: 2 - Symptomatic, <50% confined to bed  . Filed Vitals:   12/11/15 1601 12/11/15 1616  BP: 120/82 128/83  Pulse: 99 89  Temp:  98.4 F (36.9 C)  Resp: 18 18   Filed Weights   12/11/15 1609  Weight: 188 lb (85.276 kg)   .Body mass index is 28.59 kg/(m^2).  GENERAL:alert, in no acute distress and comfortable SKIN: skin color, texture, turgor are normal, no rashes or significant lesions EYES: normal, conjunctiva are pink and non-injected, sclera clear OROPHARYNX:no exudate, no erythema and lips, buccal mucosa, and tongue normal  NECK: supple, no JVD, thyroid normal size, non-tender, without nodularity LYMPH:  no palpable lymphadenopathy in the cervical, axillary or  inguinal LUNGS: clear to auscultation with normal respiratory effort HEART: regular rate & rhythm,  no murmurs and no lower extremity edema ABDOMEN: abdomen soft, non-tender, normoactive bowel sounds  Musculoskeletal: no cyanosis of digits and no clubbing  PSYCH: alert & oriented x 3 with fluent speech NEURO: no focal motor/sensory deficits  LABORATORY DATA:  I have reviewed the data as listed  . CBC Latest Ref Rng 12/11/2015 12/06/2015 11/29/2015  WBC 4.0 - 10.5 K/uL 8.3 6.4 5.3  Hemoglobin 13.0 - 17.0 g/dL 9.2(L) 9.9(L) 10.4(L)  Hematocrit 39.0 - 52.0 % 29.4(L) 31.4(L) 32.8(L)  Platelets 150 - 400 K/uL <5(LL) 7(LL) 76(L)   . Lab Results  Component Value Date   RETICCTPCT 2.6* 05/13/2014   RBC 3.34* 12/11/2015   RETICCTABS 91.0 05/13/2014    . CMP Latest Ref Rng 12/11/2015 09/09/2015 09/07/2015  Glucose 65 - 99 mg/dL 78 91 155(H)  BUN 6 - 20 mg/dL 22(H) 12 17  Creatinine 0.61 - 1.24 mg/dL 0.86 0.82 0.93  Sodium 135 - 145 mmol/L 138 137 133(L)  Potassium 3.5 - 5.1 mmol/L 3.8 3.2(L) 3.6  Chloride 101 - 111 mmol/L 106 107 106  CO2 22 - 32 mmol/L 23 23 21(L)  Calcium 8.9 - 10.3 mg/dL 8.6(L) 8.1(L) 7.9(L)  Total Protein 6.5 - 8.1 g/dL 7.6 6.8 7.3  Total Bilirubin 0.3 - 1.2 mg/dL 1.3(H) 0.5 0.5  Alkaline Phos 38 - 126 U/L 82 66 69  AST 15 - 41 U/L 47(H) 101(H) 38  ALT 17 - 63 U/L 31 113(H) 47   Peripheral Blood Smear -reviewed by me  Minimal visible platelets. No platelet clumping.significant anisopoikilocytosis with target cells, pencil cells, microspherocytes, a few schistocytes.significant polychromasia. No increased  blasts.       RADIOGRAPHIC STUDIES: I have personally reviewed the radiological images as listed and agreed with the findings in the report. Dg Chest 2 View  12/11/2015  CLINICAL DATA:  Patient diagnosed with pneumonia 2 months ago. Continued cough. EXAM: CHEST  2 VIEW COMPARISON:  PA and lateral chest 09/18/2015. FINDINGS: Right basilar airspace disease  and small right effusion have improved with only small effusion persisting. The left lung is clear. Heart size is mildly enlarged. No pneumothorax. IMPRESSION: Marked improvement in right effusion and basilar airspace disease. Cardiomegaly. Electronically Signed   By: Inge Rise M.D.   On: 12/11/2015 14:35    ASSESSMENT & PLAN:   60 year old African-American male with  #1 chronic refractory ITP - status post multiple lines of tier 1 treatment - high-dose steroids, Rituxan, splenectomy, endplate, Promacta, IVIG. Patient has had a nuclear medicine scan that rule out the presence of accessory spleens in September 2016 Patient admitted with platelet count less than 5k. He has received IVIG recently on 12/08/2015 and previously 2 doses of 1 g/kg on 11/21/2015 and 11/22/2015. Was last on Promacta 75 mg by mouth daily but had stopped responding to that. Noted to have gum bleeding, lip bleeding and some bleeding from his cheek likely due to mild bite. Plan -Dexamethasone 20 mg IV daily for 5 doses. -Patient recently received IVIG. -Transfuse 1 unit of platelets and check it one hour post count. -Transfuse platelets as needed for bleeding. -We'll give the patient 1 dose of endplate at 5 mcg/kg -Started the patient on Amicar 1 g every 6 hours for active gum and lip bleeding and possible GI bleed. -I discussed various second Tier treatment options with the patient including vincristine, cyclophosphamide, cyclosporine, CellCept or repeat Rituxan plus Velcade. -The patient does respond endplate and high-dose steroids would consider starting the patient on CellCept which has a median response time of 4-6 weeks and could be started at doses of 500 mg po bid. -we'll check an SPEP to check for the presence of a monoclonal spike. If there is an M protein spike along with the patient's refractory ITP or even without this knows a recent paper that suggested significant response to combined use of Rituxan plus  twice weekly Velcade in patient with refractory ITP. This approach seems likely to be best tolerated and effective. -Vincruistine tends to produce early responses but rarely produces persistent response. -we'll discuss his treatment options with Dr. [name] who the patients primary hematologist. -check PT and PTT to rule out other associated coagulopathies.  #2 Anemia -likely due to slow blood loss. Peripheral blood smear shows some microspherocytes this along with LDH elevation suggests some degree of hemolysis likely from the repeated IVIG. The risk of hemolysis from IVIG significantly increases with doses of more than 2 g/kg in 4 weeks. Peripheral blood smear not suggestive of microangiopathic hemolysis as the primary cause of anemia. Plan -Watch for bleeding. -Transfuse PRBC for hemoglobin less than 8 or if patient's symptomatic. -Coombs' test.  We will continue to follow daily. I will discuss the patient with Dr. [name] who will likely continue to follow the patient from tomorrow.  All of the patients questions were answered with apparent satisfaction. The patient knows to call the clinic with any problems, questions or concerns.  I spent 60 minutes counseling the patient face to face. The total time spent in the appointment was 80 minutes and more than 50% was on counseling and direct patient cares.    Oyuki Hogan  Irene Limbo MD [place] [facility] Hematology/Oncology Physician [place]  (Office):       [phone] (Work cell):  [phone] (Fax):           [phone]  12/11/2015 5:05 PM

## 2015-12-12 ENCOUNTER — Encounter (HOSPITAL_COMMUNITY): Payer: Self-pay | Admitting: Internal Medicine

## 2015-12-12 ENCOUNTER — Telehealth: Payer: Self-pay | Admitting: Hematology & Oncology

## 2015-12-12 DIAGNOSIS — E785 Hyperlipidemia, unspecified: Secondary | ICD-10-CM | POA: Diagnosis present

## 2015-12-12 DIAGNOSIS — D638 Anemia in other chronic diseases classified elsewhere: Secondary | ICD-10-CM | POA: Diagnosis present

## 2015-12-12 LAB — CBC WITH DIFFERENTIAL/PLATELET
BASOS PCT: 0 %
Basophils Absolute: 0 10*3/uL (ref 0.0–0.1)
EOS ABS: 0 10*3/uL (ref 0.0–0.7)
EOS PCT: 0 %
HCT: 27.5 % — ABNORMAL LOW (ref 39.0–52.0)
Hemoglobin: 8.7 g/dL — ABNORMAL LOW (ref 13.0–17.0)
LYMPHS ABS: 1 10*3/uL (ref 0.7–4.0)
Lymphocytes Relative: 5 %
MCH: 27.9 pg (ref 26.0–34.0)
MCHC: 31.6 g/dL (ref 30.0–36.0)
MCV: 88.1 fL (ref 78.0–100.0)
MONO ABS: 0.6 10*3/uL (ref 0.1–1.0)
Monocytes Relative: 3 %
NEUTROS ABS: 18.4 10*3/uL — AB (ref 1.7–7.7)
NEUTROS PCT: 92 %
PLATELETS: 8 10*3/uL — AB (ref 150–400)
RBC: 3.12 MIL/uL — ABNORMAL LOW (ref 4.22–5.81)
RDW: 19.1 % — ABNORMAL HIGH (ref 11.5–15.5)
WBC: 20 10*3/uL — ABNORMAL HIGH (ref 4.0–10.5)

## 2015-12-12 LAB — MULTIPLE MYELOMA PANEL, SERUM
ALBUMIN/GLOB SERPL: 0.9 (ref 0.7–1.7)
ALPHA2 GLOB SERPL ELPH-MCNC: 0.5 g/dL (ref 0.4–1.0)
Albumin SerPl Elph-Mcnc: 3.2 g/dL (ref 2.9–4.4)
Alpha 1: 0.3 g/dL (ref 0.0–0.4)
B-GLOBULIN SERPL ELPH-MCNC: 0.9 g/dL (ref 0.7–1.3)
GLOBULIN, TOTAL: 4 g/dL — AB (ref 2.2–3.9)
Gamma Glob SerPl Elph-Mcnc: 2.3 g/dL — ABNORMAL HIGH (ref 0.4–1.8)
IGA: 181 mg/dL (ref 61–437)
IGM, SERUM: 26 mg/dL (ref 20–172)
IgG (Immunoglobin G), Serum: 2677 mg/dL — ABNORMAL HIGH (ref 700–1600)
Total Protein ELP: 7.2 g/dL (ref 6.0–8.5)

## 2015-12-12 LAB — COMPREHENSIVE METABOLIC PANEL
ALT: 33 U/L (ref 17–63)
AST: 49 U/L — AB (ref 15–41)
Albumin: 3.4 g/dL — ABNORMAL LOW (ref 3.5–5.0)
Alkaline Phosphatase: 74 U/L (ref 38–126)
Anion gap: 6 (ref 5–15)
BUN: 20 mg/dL (ref 6–20)
CHLORIDE: 106 mmol/L (ref 101–111)
CO2: 25 mmol/L (ref 22–32)
CREATININE: 0.86 mg/dL (ref 0.61–1.24)
Calcium: 9.1 mg/dL (ref 8.9–10.3)
GFR calc Af Amer: >60 mL/min (ref >60)
Glucose, Bld: 140 mg/dL — ABNORMAL HIGH (ref 65–99)
Potassium: 4.7 mmol/L (ref 3.5–5.1)
Sodium: 137 mmol/L (ref 135–145)
Total Bilirubin: 1.1 mg/dL (ref 0.3–1.2)
Total Protein: 7.6 g/dL (ref 6.5–8.1)

## 2015-12-12 LAB — PROTIME-INR
INR: 1.18 (ref 0.00–1.49)
PROTHROMBIN TIME: 15.1 s (ref 11.6–15.2)

## 2015-12-12 LAB — MRSA PCR SCREENING: MRSA by PCR: NEGATIVE

## 2015-12-12 LAB — PREPARE PLATELET PHERESIS: Unit division: 0

## 2015-12-12 LAB — HAPTOGLOBIN: HAPTOGLOBIN: 29 mg/dL — AB (ref 34–200)

## 2015-12-12 MED ORDER — SODIUM CHLORIDE 0.9 % IJ SOLN
3.0000 mL | Freq: Two times a day (BID) | INTRAMUSCULAR | Status: DC
  Administered 2015-12-12 – 2015-12-15 (×7): 3 mL via INTRAVENOUS
  Administered 2015-12-16: 10 mL via INTRAVENOUS

## 2015-12-12 MED ORDER — SODIUM CHLORIDE 0.9 % IV SOLN
Freq: Once | INTRAVENOUS | Status: AC
  Administered 2015-12-13: 21:00:00 via INTRAVENOUS

## 2015-12-12 NOTE — ED Notes (Signed)
Delay in lab draw pt receiving platelets.

## 2015-12-12 NOTE — ED Notes (Addendum)
Sent page to physician Leisa Lenz asking for clarification on dosage of decadron. Will hold medication until clarified.  Order per onolocgy

## 2015-12-12 NOTE — ED Notes (Addendum)
Sent another message to MD Charlies Silvers about clarification of Dexamethasone dosage/route.  This is an order per oncology per their note  Leisa Lenz

## 2015-12-12 NOTE — ED Notes (Signed)
Pt sleeping in bed.  VS stable.

## 2015-12-12 NOTE — Progress Notes (Signed)
Contact with medical director for level of care

## 2015-12-12 NOTE — ED Notes (Signed)
Called to check about PLT

## 2015-12-12 NOTE — Telephone Encounter (Signed)
Faxed form from [place] was faxed today as urgent to:  Medical Drug Revd Team on behalf of [place] IVIG [id number] [place]: [phone] F: [phone]  APPROVED and back dates are pending appeal retro prior auth status at this time Claim numbers: 10.05.2016 [id number] 10.06.2016 - [id number] 12.27.2016 - [id number] 12.28.2016 - [id number] 01.13.2017 - [id number] - per [employer] the [id number] was paid in the amount of $6464.00, not sure an appeal would need to be done on this one.

## 2015-12-12 NOTE — Progress Notes (Signed)
Patient ID: Scott Waters, male   DOB: [birth year], 60 y.o.   MRN: [id number] [facility] PROGRESS NOTE  Scott Waters [id number] DOB: 05/27/1956 DOA: 12/11/2015 PCP: [name], MD  Brief narrative:    60 -year-old male with past medical history including but not limited to refractory immune thrombocytopeniawho follows with Dr. Lattie Haw. Patient presented to [facility] with worsening thrombocytopenia and bleeding from gums. Pt was diagnosed with ITP around 06/ 2015 and has had extensive treatments including high-dose steroids, splenectomy, Rituxan (in 08/2014), Nplate, Promacta, IVIG on several occasions. Most recently pt was on Promacta 75 mg by mouth daily which was not found to be very useful and was discontinued about a week prior to this admission.  Patient was hemodynamically stable on admission. Blood work was notable for hemoglobin of 9.2 (repeat 8.4) and platelets less than 5. His most recent IVIG was 1/13. So far patient has received 3 units of PRBC in ED.   Assessment/Plan:    Principal Problem:   Gums, bleeding secondary to chronic refractory ITP / H/O splenectomy - Appreciate hematology seeing the patient in consultation - Recommendation is for Decadron 20 mg IV daily for 5 doses, first dose given 12/12/2015 - Status post 1 unit of platelets transfusion - Patient also started on Amicar 1 g every 6 hours to control the bleeding - As noted above, he is status post IVIG 12/08/2015.   Active Problems:   Dyslipidemia - Continue Lipitor 20 mg daily    Essential hypertension - Continue Norvasc 10 mg daily and Avapro 300 mg daily    Anemia of chronic disease - Likely because of ITP - Patient has received 3 units of PRBC in ED. - We'll hold off on any other blood transfusion unless hemoglobin less than 8   DVT Prophylaxis  - SCDs due to risk of bleeding due to thrombocytopenia   Code Status: Full.  Family Communication:  plan of care  discussed with the patient Disposition Plan: Home once platelets improve per oncology but reasonable to have platelets at least 50 or above   IV access:  Peripheral IV  Procedures and diagnostic studies:    Dg Chest 2 View 12/11/2015 Marked improvement in right effusion and basilar airspace disease. Cardiomegaly.   Medical Consultants:  Hematology, Dr. Cloria Spring   Other Consultants:  None   IAnti-Infectives:   None    Leisa Lenz, MD  [facility] Pager [phone]  Time spent in minutes: 25 minutes  If 7PM-7AM, please contact night-coverage [email or web address] Password [email or web address] 12/12/2015, 12:42 PM   LOS: 1 day    HPI/Subjective: No acute overnight events. Patient reports no longer bleeding from gums.  Objective: Filed Vitals:   12/12/15 0646 12/12/15 0713 12/12/15 1024 12/12/15 1110  BP: 119/84 117/84 116/74 100/59  Pulse: 82 86 88   Temp:  97.8 F (36.6 C) 98.2 F (36.8 C)   TempSrc:  Oral Oral   Resp: 16  17   Height:      Weight:      SpO2: 94% 98% 99%     Intake/Output Summary (Last 24 hours) at 12/12/15 1242 Last data filed at 12/12/15 0713  Gross per 24 hour  Intake    372 ml  Output    525 ml  Net   -153 ml    Exam:   General:  Pt is alert, follows commands appropriately, not in acute distress  Cardiovascular: Regular rate and rhythm, S1/S2 (+)  Respiratory: Clear to auscultation bilaterally, no  wheezing, no crackles, no rhonchi  Abdomen: Soft, non tender, non distended, bowel sounds present  Extremities: No edema, pulses DP and PT palpable bilaterally  Neuro: Grossly nonfocal  Data Reviewed: Basic Metabolic Panel:  Recent Labs Lab 12/11/15 1422 12/12/15 0842  NA 138 137  K 3.8 4.7  CL 106 106  CO2 23 25  GLUCOSE 78 140*  BUN 22* 20  CREATININE 0.86 0.86  CALCIUM 8.6* 9.1   Liver Function Tests:  Recent Labs Lab 12/11/15 1422 12/12/15 0842  AST 47* 49*  ALT 31 33  ALKPHOS 82 74  BILITOT 1.3* 1.1  PROT 7.6 7.6   ALBUMIN 3.6 3.4*   No results for input(s): LIPASE, AMYLASE in the last 168 hours. No results for input(s): AMMONIA in the last 168 hours. CBC:  Recent Labs Lab 12/06/15 1434 12/11/15 1422 12/11/15 2245 12/12/15 0842  WBC 6.4 8.3 14.1* 20.0*  NEUTROABS 3.9  --  12.8* 18.4*  HGB 9.9* 9.2* 8.4* 8.7*  HCT 31.4* 29.4* 26.1* 27.5*  MCV 86 88.0 86.7 88.1  PLT 7* <5* <5* 8*   Cardiac Enzymes: No results for input(s): CKTOTAL, CKMB, CKMBINDEX, TROPONINI in the last 168 hours. BNP: Invalid input(s): POCBNP CBG: No results for input(s): GLUCAP in the last 168 hours.  No results found for this or any previous visit (from the past 240 hour(s)).   Scheduled Meds: . aminocaproic acid  1 g Oral 4 times per day  . amLODipine  10 mg Oral Daily   And  . irbesartan  300 mg Oral Daily  . atorvastatin  20 mg Oral q1800  . dexamethasone  20 mg Intravenous Q breakfast  . fluticasone  1 spray Each Nare Daily  . folic acid  1 mg Oral Daily  . loratadine  10 mg Oral Daily  . multivitamin with minerals  1 tablet Oral q morning - 10a  . pantoprazole  40 mg Oral Daily  . potassium chloride  20 mEq Oral Daily  . senna-docusate  1 tablet Oral BID  . sodium chloride  3 mL Intravenous Q12H   Continuous Infusions: . sodium chloride

## 2015-12-12 NOTE — ED Notes (Signed)
Called blood bank about PLT again, said they would have to get some sent and give me a call back

## 2015-12-13 ENCOUNTER — Other Ambulatory Visit: Payer: BLUE CROSS/BLUE SHIELD

## 2015-12-13 DIAGNOSIS — D638 Anemia in other chronic diseases classified elsewhere: Secondary | ICD-10-CM

## 2015-12-13 DIAGNOSIS — D72829 Elevated white blood cell count, unspecified: Secondary | ICD-10-CM | POA: Diagnosis present

## 2015-12-13 DIAGNOSIS — E785 Hyperlipidemia, unspecified: Secondary | ICD-10-CM

## 2015-12-13 DIAGNOSIS — K055 Other periodontal diseases: Secondary | ICD-10-CM

## 2015-12-13 LAB — CBC WITH DIFFERENTIAL/PLATELET
Basophils Absolute: 0 10*3/uL (ref 0.0–0.1)
Basophils Relative: 0 %
Eosinophils Absolute: 0 10*3/uL (ref 0.0–0.7)
Eosinophils Relative: 0 %
HCT: 23.4 % — ABNORMAL LOW (ref 39.0–52.0)
Hemoglobin: 7.4 g/dL — ABNORMAL LOW (ref 13.0–17.0)
Lymphocytes Relative: 8 %
Lymphs Abs: 1.7 10*3/uL (ref 0.7–4.0)
MCH: 28.1 pg (ref 26.0–34.0)
MCHC: 31.6 g/dL (ref 30.0–36.0)
MCV: 89 fL (ref 78.0–100.0)
Monocytes Absolute: 1.3 10*3/uL — ABNORMAL HIGH (ref 0.1–1.0)
Monocytes Relative: 6 %
Neutro Abs: 17.9 10*3/uL — ABNORMAL HIGH (ref 1.7–7.7)
Neutrophils Relative %: 86 %
Platelets: 6 10*3/uL — CL (ref 150–400)
RBC: 2.63 MIL/uL — ABNORMAL LOW (ref 4.22–5.81)
RDW: 19 % — ABNORMAL HIGH (ref 11.5–15.5)
WBC: 20.9 10*3/uL — ABNORMAL HIGH (ref 4.0–10.5)

## 2015-12-13 LAB — PREPARE PLATELET PHERESIS
Unit division: 0
Unit division: 0

## 2015-12-13 LAB — PREPARE RBC (CROSSMATCH)

## 2015-12-13 MED ORDER — MYCOPHENOLATE MOFETIL 250 MG PO CAPS
500.0000 mg | ORAL_CAPSULE | Freq: Two times a day (BID) | ORAL | Status: DC
  Administered 2015-12-13 – 2015-12-16 (×7): 500 mg via ORAL
  Filled 2015-12-13 (×11): qty 2

## 2015-12-13 MED ORDER — FUROSEMIDE 10 MG/ML IJ SOLN
20.0000 mg | Freq: Once | INTRAMUSCULAR | Status: AC
  Administered 2015-12-13: 20 mg via INTRAVENOUS
  Filled 2015-12-13: qty 2

## 2015-12-13 MED ORDER — SODIUM CHLORIDE 0.9 % IV SOLN
Freq: Once | INTRAVENOUS | Status: AC
  Administered 2015-12-13: 10:00:00 via INTRAVENOUS

## 2015-12-13 MED ORDER — DEXAMETHASONE SODIUM PHOSPHATE 10 MG/ML IJ SOLN: 40.0000 mg | INTRAMUSCULAR | Status: DC

## 2015-12-13 MED ORDER — DEXAMETHASONE SODIUM PHOSPHATE 4 MG/ML IJ SOLN
40.0000 mg | Freq: Every day | INTRAMUSCULAR | Status: DC
  Filled 2015-12-13 (×2): qty 10

## 2015-12-13 MED ORDER — SODIUM CHLORIDE 0.9 % IV SOLN
Freq: Once | INTRAVENOUS | Status: AC
  Administered 2015-12-13: 12:00:00 via INTRAVENOUS

## 2015-12-13 MED ORDER — SODIUM CHLORIDE 0.9 % IV SOLN
40.0000 mg | INTRAVENOUS | Status: AC
  Administered 2015-12-13 – 2015-12-14 (×2): 40 mg via INTRAVENOUS
  Filled 2015-12-13 (×2): qty 4

## 2015-12-13 MED ORDER — MYCOPHENOLATE MOFETIL 250 MG PO CAPS
250.0000 mg | ORAL_CAPSULE | Freq: Two times a day (BID) | ORAL | Status: DC
  Filled 2015-12-13 (×2): qty 1

## 2015-12-13 NOTE — Progress Notes (Signed)
Date: December 13, 2015 Chart reviewed for concurrent status and case management needs. Will continue to follow patient for changes and needs:  hgb 7.2 and receiving 2 units of prbc today Velva Harman, RN, BSN, [place]   [phone]

## 2015-12-13 NOTE — Progress Notes (Signed)
I appreciate his help in trying to care for Scott Waters. His ITP is probably one of the toughest cases that of ever seen. He always responds transiently to interventions and then becomes refractory.  He has been getting intermittent IVIG in the office. He he last received IVIG on the 13th.  He now comes in with mucosal bleeding. His hemoglobin today is 7.4. I think a transfusion of 2 units of blood will help. This could help provide a limited extra this [place].  I will try him on mycophenolate. The dose of 500 mg twice a day area and this might allow for a transient response.  He's had no obvious melena or bright or blood per rectum.  He's had no fever. He thought he may be coming down with some type of "cold". He took one dose of an over-the-counter medication.  He's had no cough. He's had no nausea or vomiting.  On his physical exam, his vital signs are all stable. His temperature is 97.8. Pulse is 92. Blood pressure 117/69. Head and neck exam shows a hematoma on the lower lip on the left side. I do not see any buccal hematomas or palatal petechia. Neck is supple without adenopathy. Lungs are clear. Cardiac exam regular in rhythm with no murmurs, rubs or bruits. Abdomen is soft. He has no palpable hepatic edge. He has well-healed laparotomy scar from his splenectomy. Extremities shows some ecchymoses on his legs. Neurological exam is nonfocal.  For now, we will see how the mycophenolate works. I will give him 2 units of blood.  Again, this is been one of the toughest cases of immune thrombocytopenia that I have seen.  I do appreciate everybody's help.  Proverbs 16:9

## 2015-12-13 NOTE — Progress Notes (Signed)
Initial Nutrition Assessment  DOCUMENTATION CODES:   Not applicable  INTERVENTION:  -Patient declined ONS at this time -RD to continue to monitor for needs  NUTRITION DIAGNOSIS:   Inadequate oral intake related to chronic illness, inability to eat, poor appetite as evidenced by per patient/family report.  GOAL:   Patient will meet greater than or equal to 90% of their needs  MONITOR:   PO intake, I & O's, Labs, Weight trends  REASON FOR ASSESSMENT:   Malnutrition Screening Tool    ASSESSMENT:   Scott Waters is a 60 yo male H/o refractory ITP s/p splenectomy, last dose of IVIG on 1/13, presented to [facility] ED due to c/o nose bleed this am, on arrival to the ED, patient has no active bleed, but cbc showed plt less than 5, EDP paged hematology who recommended plt and decadron and admission to [facility]. H/o refractory ITP s/p splenectomy, last dose of IVIG on 1/13, presented to [facility] ED due to c/o nose bleed this am, on arrival to the ED, patient has no active bleed, but cbc showed plt less than 5, EDP paged hematology who recommended plt and decadron and admission to [facility].   Spoke with pt at bedside. Pt endorsed 40# wt loss in 1.5 years on admission. 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 On top of that, pt states that he could not taste anything. Thus, pt did not have a good appetite. Pt denied chewing or swallowing problems. No n/v/d/c.  He did say his appetite and ability to taste is returning now.   Pt had lunch at bedside. He ate 100% of it. No other PO intake in chart.  Nutrition-Focused physical exam completed. Findings are no fat depletion, no muscle depletion, and no edema.   Labs: CBGs: 119-121; Lipase 516; AST 49; LDH 351; Triglycerides 182  Medications reviewed.  Pt declined ONS at this time.  Diet Order:  Diet regular Room service appropriate?: Yes; Fluid consistency:: Thin  Skin:  Wound (see comment) (Bilateral  petechiae.)  Last BM:  1/16  Height:   Ht Readings from Last 1 Encounters:  12/12/15 5\' 8"  (1.727 m)    Weight:   Wt Readings from Last 1 Encounters:  12/12/15 184 lb 4.9 oz (83.6 kg)    Ideal Body Weight:  70 kg  BMI:  Body mass index is 28.03 kg/(m^2).  Estimated Nutritional Needs:   Kcal:  1700-2000  Protein:  80-90 grams  Fluid:  >/= 1.7L  EDUCATION NEEDS:   No education needs identified at this time  Satira Anis. Azlee Monforte, MS, RD LDN After Hours/Weekend Pager [phone]

## 2015-12-13 NOTE — Progress Notes (Addendum)
Patient ID: Scott Waters, male   DOB: [birth year], 60 y.o.   MRN: [id number] [facility] PROGRESS NOTE  Ric Crafts [id number] DOB: Dec 10, 1955 DOA: 12/11/2015 PCP: [name], MD  Brief narrative:    60 year-old male with past medical history including but not limited to refractory immune thrombocytopeniawho follows with Dr. Lattie Haw. Patient presented to [facility] long hospital with worsening thrombocytopenia and bleeding from gums. Pt was diagnosed with ITP around 06/ 2015 and has had extensive treatments including high-dose steroids, splenectomy, Rituxan (in 08/2014), Nplate, Promacta, IVIG on several occasions. Most recently pt was on Promacta 75 mg by mouth daily which was not found to be very useful and was discontinued about a week prior to this admission.  Patient was hemodynamically stable on admission. Blood work was notable for hemoglobin of 9.2 (repeat 8.4) and platelets less than 5. His most recent IVIG was 1/13.    Assessment/Plan:    Principal Problem:  Gums, bleeding secondary to chronic refractory ITP / H/O splenectomy - Appreciate Dr. Antonieta Pert help - Pt has received 3 U PRBC and 1 unit of platelet in ED - Today he will receive 2 units of PRBC and 2 units of platelets per hematology recommendation - Platelet count is 6 and hemoglobin is 7.4 this morning - Will get Lasix in between the transfusion - He has received Decadron 20 mg IV 1 time dose in ED and he is currently on 40 mg IV daily dosing - Continue Amicar 1 g every 6 hours to control the bleeding - Pt is status post IVIG 12/08/2015. - Continue to monitor in step down unit because of risk of bleeding with severe thrombocytopenia - CBC In AM  Active Problems:   Leukocytosis - Likely due to steroids - No evidence of acute infection   Dyslipidemia - Continue Lipitor 20 mg daily   Essential hypertension - Continue Norvasc 10 mg daily and Avapro 300 mg daily   Anemia of chronic  disease - Secondary to ITP - As mentioned above, he has received 3 units of PRBC in ED and he will receive 2 units of PRBC today  DVT Prophylaxis  - SCDs due to risk of bleeding due to thrombocytopenia  Code Status: Full.  Family Communication:  plan of care discussed with the patient Disposition Plan: Home once platelets improve and once cleared by hematology   IV access:  Peripheral IV  Procedures and diagnostic studies:    Dg Chest 2 View 12/11/2015  Marked improvement in right effusion and basilar airspace disease. Cardiomegaly.   Medical Consultants:  Hematology / Oncology - Dr.Enever   Other Consultants:  None   IAnti-Infectives:   None   [name], MD  [facility] Pager [phone]  If 7PM-7AM, please contact night-coverage [email or web address] Password [email or web address]  Time spent in minutes: 25 minutes  12/13/2015, 11:39 AM   LOS: 2 days    HPI/Subjective: No acute overnight events. Patient reports feeling better.  Objective: Filed Vitals:   12/13/15 0800 12/13/15 0940 12/13/15 1000 12/13/15 1010  BP:  132/82 138/84 123/68  Pulse: 81 86 80 78  Temp: 97.8 F (36.6 C) 97 F (36.1 C)  98.6 F (37 C)  TempSrc: Oral Axillary  Oral  Resp: 15 19 16 20   Height:      Weight:      SpO2: 100% 100% 99% 100%    Intake/Output Summary (Last 24 hours) at 12/13/15 1139 Last data filed at 12/12/15 2345  Gross per 24 hour  Intake  368 ml  Output    500 ml  Net   -132 ml    Exam:   General:  Pt is alert, not in acute distress  Cardiovascular: Regular rate and rhythm, S1/S2 (+)  Respiratory: no wheezing, no crackles, no rhonchi  Abdomen: (+) BS, non tender   Extremities: Palpable pulses, no edema   Neuro: Nonfocal  Data Reviewed: Basic Metabolic Panel:  Recent Labs Lab 12/11/15 1422 12/12/15 0842  NA 138 137  K 3.8 4.7  CL 106 106  CO2 23 25  GLUCOSE 78 140*  BUN 22* 20  CREATININE 0.86 0.86  CALCIUM 8.6* 9.1   Liver Function  Tests:  Recent Labs Lab 12/11/15 1422 12/12/15 0842  AST 47* 49*  ALT 31 33  ALKPHOS 82 74  BILITOT 1.3* 1.1  PROT 7.6 7.6  ALBUMIN 3.6 3.4*   CBC:  Recent Labs Lab 12/06/15 1434 12/11/15 1422 12/11/15 2245 12/12/15 0842 12/13/15 0335  WBC 6.4 8.3 14.1* 20.0* 20.9*  NEUTROABS 3.9  --  12.8* 18.4* 17.9*  HGB 9.9* 9.2* 8.4* 8.7* 7.4*  HCT 31.4* 29.4* 26.1* 27.5* 23.4*  MCV 86 88.0 86.7 88.1 89.0  PLT 7* <5* <5* 8* 6*   Recent Results (from the past 240 hour(s))  MRSA PCR Screening     Status: None   Collection Time: 12/12/15  2:06 PM  Result Value Ref Range Status   MRSA by PCR NEGATIVE NEGATIVE Final    Scheduled Meds: . aminocaproic acid  1 g Oral 4 times per day  . amLODipine  10 mg Oral Daily  . irbesartan  300 mg Oral Daily  . atorvastatin  20 mg Oral q1800  . dexamethasone   40 mg Intravenous Q24H  . fluticasone  1 spray Each Nare Daily  . folic acid  1 mg Oral Daily  . furosemide  20 mg Intravenous Once  . loratadine  10 mg Oral Daily  . multivitamin with   1 tablet Oral q morning - 10a  . mycophenolate  500 mg Oral BID  . pantoprazole  40 mg Oral Daily  . potassium chloride  20 mEq Oral Daily  . senna-docusate  1 tablet Oral BID   Continuous Infusions:

## 2015-12-14 ENCOUNTER — Inpatient Hospital Stay (HOSPITAL_COMMUNITY): Payer: BLUE CROSS/BLUE SHIELD

## 2015-12-14 LAB — PREPARE PLATELET PHERESIS
Unit division: 0
Unit division: 0

## 2015-12-14 LAB — TYPE AND SCREEN
ABO/RH(D): A POS
Antibody Screen: POSITIVE
UNIT DIVISION: 0
Unit division: 0

## 2015-12-14 LAB — BASIC METABOLIC PANEL
ANION GAP: 8 (ref 5–15)
BUN: 29 mg/dL — ABNORMAL HIGH (ref 6–20)
CALCIUM: 8.4 mg/dL — AB (ref 8.9–10.3)
CO2: 22 mmol/L (ref 22–32)
CREATININE: 0.87 mg/dL (ref 0.61–1.24)
Chloride: 109 mmol/L (ref 101–111)
Glucose, Bld: 179 mg/dL — ABNORMAL HIGH (ref 65–99)
Potassium: 3.9 mmol/L (ref 3.5–5.1)
Sodium: 139 mmol/L (ref 135–145)

## 2015-12-14 LAB — CBC
HEMATOCRIT: 27.6 % — AB (ref 39.0–52.0)
Hemoglobin: 9.2 g/dL — ABNORMAL LOW (ref 13.0–17.0)
MCH: 28.8 pg (ref 26.0–34.0)
MCHC: 33.3 g/dL (ref 30.0–36.0)
MCV: 86.3 fL (ref 78.0–100.0)
PLATELETS: 5 10*3/uL — AB (ref 150–400)
RBC: 3.2 MIL/uL — ABNORMAL LOW (ref 4.22–5.81)
RDW: 19 % — AB (ref 11.5–15.5)
WBC: 22.6 10*3/uL — AB (ref 4.0–10.5)

## 2015-12-14 MED ORDER — IMMUNE GLOBULIN (HUMAN) 10 GM/100ML IV SOLN: 1.0000 g/kg | INTRAVENOUS | Status: DC

## 2015-12-14 MED ORDER — IMMUNE GLOBULIN (HUMAN) 5 GM/50ML IV SOLN
1.0000 g/kg | INTRAVENOUS | Status: AC
  Administered 2015-12-14 – 2015-12-15 (×2): 85 g via INTRAVENOUS
  Filled 2015-12-14 (×2): qty 50

## 2015-12-14 MED ORDER — TECHNETIUM TC 99M SULFUR COLLOID
5.1000 | Freq: Once | INTRAVENOUS | Status: AC | PRN
  Administered 2015-12-14: 5.1 via INTRAVENOUS

## 2015-12-14 NOTE — Progress Notes (Signed)
Scott Waters is relatively stable. He's not having any obvious bleeding. We started his CellCept yesterday.  I do think that some IVIG might be helpful. He is on some steroid right now.  His platelet count is still 5000. He did get 2 units of blood yesterday. His hemoglobin is up to 9.2. Potassium is okay. His creatinine 0.87.  He has had no cough. He's had a positive bowels or bladder. He's had no leg swelling.  Again, I think that IVIG will be helpful. I would like to try to get his blood count above 20,000 before he is discharged.  I'm going to also order a liver spleen scan to see if there is any accessory spleen that might account for the thrombocytopenia.  On his physical exam, his vital signs show temperature 98.1. Pulse 75 by mouth blood pressure 112/67. His lungs are clear. Head and neck exam shows no oral lesions. He may have trouble small petechia on the mucosa. His cardiac exam regular rate and rhythm. Abdomen is soft. There is no fluid wave. There is no guarding or rebound tenderness. Extremities shows no clubbing, cyanosis or edema. Neurological exam is nonfocal.  Mr. also has refractory immune thrombocytopenia. Nothing has worked for any significant amount time. The best thing that happened was his splenectomy and [facility] got a few months out of that.  Maybe the CellCept will help.  We will try another round of IVIG.  I very much appreciate all the gray care that he is getting by the staff down in the ICU. Joylene John 6:9

## 2015-12-14 NOTE — Progress Notes (Addendum)
Patient ID: Scott Waters, male   DOB: [birth year], 60 y.o.   MRN: [id number] [facility] PROGRESS NOTE  Scott Waters [id number] DOB: Jul 18, 1956 DOA: 12/11/2015 PCP: [name], MD  Brief narrative:    60 year-old male with past medical history including but not limited to refractory immune thrombocytopeniawho follows with Dr. Lattie Haw. Patient presented to [facility] long hospital with worsening thrombocytopenia and bleeding from gums. Pt was diagnosed with ITP around 06/ 2015 and has had extensive treatments including high-dose steroids, splenectomy, Rituxan (in 08/2014), Nplate, Promacta, IVIG on several occasions. Most recently pt was on Promacta 75 mg by mouth daily which was not found to be very useful and was discontinued about a week prior to this admission.  Patient was hemodynamically stable on admission. Blood work was notable for hemoglobin of 9.2 (repeat 8.4) and platelets less than 5. His most recent IVIG was 1/13.   So far patient has received total of 5 units of PRBC transfusion and 3 units of platelet transfusion and his platelet count is still less than 10. Dr. [name] plans for IV IG today and CellCept was started 12/13/2015.  Assessment/Plan:    Principal Problem:  Refractory ITP / H/O splenectomy / Anemia of chronic disease  - So far patient has received total of 5 units of plt transfusion and 2 units of prbc transfusion - His hemoglobin is 9.2 this morning and platelet count remains low at 5 - He was started on CellCept 12/13/2015 - We will start IVIG today per hematology recommendations - Hematology also plans for liver scan to investigate if there is any problem there that could contribute to ongoing thrombocytopenia - As already mentioned patient is status post splenectomy in past - Patient has gotten Decadron 20 mg IV 1 time dosing ED and subsequently the following day he received Decadron 40 mg IV - He was also placed on Amicar at the time of the  admission which has been stopped 12/14/2015. - Continue Protonix 40 mg daily - transfer to medical unit   Active Problems:  Leukocytosis - Likely secondary to steroid injections - No signs of acute infectious process   Dyslipidemia - Continue Lipitor 20 mg daily   Essential hypertension - Continue Norvasc 10 mg daily and Avapro 300 mg daily  DVT Prophylaxis  - SCDs due to thrombocytopenia  Code Status: Full.  Family Communication:  plan of care discussed with the patient Disposition Plan: Home once cleared by oncology   IV access:  Peripheral IV  Procedures and diagnostic studies:    Dg Chest 2 View 12/11/2015 Marked improvement in right effusion and basilar airspace disease. Cardiomegaly.   Medical Consultants:  Hematology / Oncology - Dr.Enever   Other Consultants:  None   IAnti-Infectives:   None   [name], MD [facility] Pager [phone]  Time spent in minutes: 25 minutes  If 7PM-7AM, please contact night-coverage [email or web address] Password [email or web address] 12/14/2015, 12:12 PM   LOS: 3 days   HPI/Subjective: No acute overnight events. Patient reports no obvious bleeding.   Objective: Filed Vitals:   12/14/15 1015 12/14/15 1054 12/14/15 1131 12/14/15 1203  BP:  130/83 113/65   Pulse:  81 84   Temp: 98.5 F (36.9 C) 98.6 F (37 C) 98.7 F (37.1 C) 98.4 F (36.9 C)  TempSrc:  Oral Oral Oral  Resp:  19 19   Height:      Weight:      SpO2:  98% 97%     Intake/Output Summary (Last 24  hours) at 12/14/15 1212 Last data filed at 12/14/15 1046  Gross per 24 hour  Intake   1286 ml  Output   2725 ml  Net  -1439 ml    Exam:   General:  Pt is alert, follows commands appropriately, not in acute distress  Cardiovascular: Regular rate and rhythm, S1/S2 (+)  Respiratory: Clear to auscultation bilaterally, no wheezing, no crackles, no rhonchi  Abdomen: Soft, non tender, non distended, bowel sounds present  Data Reviewed: Basic  Metabolic Panel:  Recent Labs Lab 12/11/15 1422 12/12/15 0842 12/14/15 0353  NA 138 137 139  K 3.8 4.7 3.9  CL 106 106 109  CO2 23 25 22   GLUCOSE 78 140* 179*  BUN 22* 20 29*  CREATININE 0.86 0.86 0.87  CALCIUM 8.6* 9.1 8.4*   Liver Function Tests:  Recent Labs Lab 12/11/15 1422 12/12/15 0842  AST 47* 49*  ALT 31 33  ALKPHOS 82 74  BILITOT 1.3* 1.1  PROT 7.6 7.6  ALBUMIN 3.6 3.4*   CBC:  Recent Labs Lab 12/11/15 1422 12/11/15 2245 12/12/15 0842 12/13/15 0335 12/14/15 0353  WBC 8.3 14.1* 20.0* 20.9* 22.6*  NEUTROABS  --  12.8* 18.4* 17.9*  --   HGB 9.2* 8.4* 8.7* 7.4* 9.2*  HCT 29.4* 26.1* 27.5* 23.4* 27.6*  MCV 88.0 86.7 88.1 89.0 86.3  PLT <5* <5* 8* 6* 5*   Recent Results (from the past 240 hour(s))  MRSA PCR Screening     Status: None   Collection Time: 12/12/15  2:06 PM  Result Value Ref Range Status   MRSA by PCR NEGATIVE NEGATIVE Final    Scheduled Meds: . amLODipine  10 mg Oral Daily   And  . irbesartan  300 mg Oral Daily  . atorvastatin  20 mg Oral q1800  . fluticasone  1 spray Each Nare Daily  . folic acid  1 mg Oral Daily  . Immune Globulin 10%  1 g/kg Intravenous Q24 Hr x 2  . loratadine  10 mg Oral Daily  . multivitamin with minerals  1 tablet Oral q morning - 10a  . mycophenolate  500 mg Oral BID  . pantoprazole  40 mg Oral Daily  . potassium chloride  20 mEq Oral Daily  . senna-docusate  1 tablet Oral BID  . sodium chloride  3 mL Intravenous Q12H   Continuous Infusions:

## 2015-12-15 ENCOUNTER — Telehealth: Payer: Self-pay | Admitting: Hematology & Oncology

## 2015-12-15 DIAGNOSIS — D72829 Elevated white blood cell count, unspecified: Secondary | ICD-10-CM

## 2015-12-15 DIAGNOSIS — Z9081 Acquired absence of spleen: Secondary | ICD-10-CM

## 2015-12-15 LAB — CBC
HEMATOCRIT: 26.4 % — AB (ref 39.0–52.0)
Hemoglobin: 8.4 g/dL — ABNORMAL LOW (ref 13.0–17.0)
MCH: 28.4 pg (ref 26.0–34.0)
MCHC: 31.8 g/dL (ref 30.0–36.0)
MCV: 89.2 fL (ref 78.0–100.0)
PLATELETS: 10 10*3/uL — AB (ref 150–400)
RBC: 2.96 MIL/uL — ABNORMAL LOW (ref 4.22–5.81)
RDW: 19.4 % — AB (ref 11.5–15.5)
WBC: 28.7 10*3/uL — AB (ref 4.0–10.5)

## 2015-12-15 LAB — BASIC METABOLIC PANEL
ANION GAP: 7 (ref 5–15)
BUN: 30 mg/dL — AB (ref 6–20)
CALCIUM: 8.1 mg/dL — AB (ref 8.9–10.3)
CO2: 24 mmol/L (ref 22–32)
Chloride: 108 mmol/L (ref 101–111)
Creatinine, Ser: 0.78 mg/dL (ref 0.61–1.24)
GFR calc Af Amer: >60 mL/min (ref >60)
GFR calc non Af Amer: >60 mL/min (ref >60)
GLUCOSE: 116 mg/dL — AB (ref 65–99)
Potassium: 3.8 mmol/L (ref 3.5–5.1)
Sodium: 139 mmol/L (ref 135–145)

## 2015-12-15 MED ORDER — DEXAMETHASONE SODIUM PHOSPHATE 10 MG/ML IJ SOLN
40.0000 mg | INTRAMUSCULAR | Status: DC
  Administered 2015-12-15 – 2015-12-16 (×2): 40 mg via INTRAVENOUS
  Filled 2015-12-15 (×2): qty 4

## 2015-12-15 NOTE — Telephone Encounter (Signed)
[employer] APPROVED  Valid: 12/12/2015 - 03/10/2016 [employer] Inravenous Req ID: [id number]  Valid: 12/09/2015 - 03/07/2016 [employer] Inravenous Req ID: [id number]  Member ID: [id number] Dob: 1956/03/29 Dx: [id number]   P: [id number] F: [id number]   COPY SCANNED

## 2015-12-15 NOTE — Progress Notes (Addendum)
Patient ID: Scott Waters, male   DOB: [birth year], 60 y.o.   MRN: [id number] [facility] PROGRESS NOTE  Scott Waters [id number] DOB: [birth year] DOA: 12/11/2015 PCP: [name], MD  Brief narrative:    60 year-old male with past medical history including but not limited to refractory immune thrombocytopeniawho follows with Dr. Lattie Haw. Patient presented to [facility] long hospital with worsening thrombocytopenia and bleeding from gums. Pt was diagnosed with ITP around 06/ 2015 and has had extensive treatments including high-dose steroids, splenectomy, Rituxan (in 08/2014), Nplate, Promacta, IVIG on several occasions. Most recently pt was on Promacta 75 mg by mouth daily which was not found to be very useful and was discontinued about a week prior to this admission.  Patient was hemodynamically stable on admission. Blood work was notable for hemoglobin of 9.2 (repeat 8.4) and platelets less than 5.   So far patient has received total of 5 units of platelets transfusion and 2 units of PRBC transfusion. IVIG started 1/19 and Cellcept started 1/18.   Assessment/Plan:    Principal Problem:  Gums, bleeding secondary to chronic refractory ITP / H/O splenectomy / Anemia of chronic disease  - Based on order history review, patient has received 5 units of platelets and total of 2 Units PRBC transfusion during this hospital stay - Patient has gotten Decadron 20 mg IV 1 time dosing ED and subsequently the following day he received Decadron 40 mg IV. He also received Amicar on admission and this was stopped 12/14/2015. - Cellcept started 1/18 and IVIG started 12/14/2015. - Hemoglobin is stable at 8.4 - Platelets are 10 this am - No acute findings on liver scan - Appreciate Dr. [name] following and his input    Active Problems:  Leukocytosis - Likely from steroids  - No evidence of an acute infectious process   Dyslipidemia - Continue Lipitor 20 mg daily   Essential  hypertension - BP 120/73 - Continue Norvasc 10 mg daily and Avapro 300 mg daily  DVT Prophylaxis  - SCD's due to risk of bleeding from severe thrombocytopenia    Code Status: Full.  Family Communication:  plan of care discussed with the patient Disposition Plan: Home when platelets at least 20K   IV access:  Peripheral IV  Procedures and diagnostic studies:    Dg Chest 2 View 12/11/2015 Marked improvement in right effusion and basilar airspace disease. Cardiomegaly. Electronically Signed   By: Inge Rise M.D.   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   Medical Consultants:  Dr. [name], Hematology/oncology  Other Consultants:  None   IAnti-Infectives:   None   MAGICK-MYERS, Rebecca Eaton, MD [facility] Pager [phone]  Time spent in minutes: 25 minutes  12/15/2015, 9:47 AM   LOS: 4 days   HPI/Subjective: No acute overnight events. Patient reports no bleeding.  Objective: Filed Vitals:   12/14/15 1800 12/14/15 1942 12/14/15 2334 12/15/15 0326  BP: 125/80   120/73  Pulse:    76  Temp:  98.4 F (36.9 C) 98.3 F (36.8 C) 97.9 F (36.6 C)  TempSrc:  Oral Oral Oral  Resp:    21  Height:    5\' 8"  (1.727 m)  Weight:    189 lb (85.73 kg)  SpO2:    99%    Intake/Output Summary (Last 24 hours) at 12/15/15 0947 Last data filed at 12/15/15 0830  Gross per 24 hour  Intake  0 ml  Output   1175 ml  Net  -1175 ml    Exam:   General:  Pt is alert, not in acute distress  Cardiovascular: Regular rate and rhythm, S1/S2 appreciated   Respiratory: Bilateral air entry, no wheezing  Abdomen: (+) BS, non tender abdomen   Extremities: No leg swelling, palpable pulses   Neuro: Nonfocal  Data Reviewed: Basic Metabolic Panel:  Recent Labs Lab 12/11/15 1422 12/12/15 0842 12/14/15 0353 12/15/15 0415  NA 138 137 139 139  K 3.8 4.7 3.9 3.8  CL 106 106 109 108  CO2 23 25  22 24   GLUCOSE 78 140* 179* 116*  BUN 22* 20 29* 30*  CREATININE 0.86 0.86 0.87 0.78  CALCIUM 8.6* 9.1 8.4* 8.1*   Liver Function Tests:  Recent Labs Lab 12/11/15 1422 12/12/15 0842  AST 47* 49*  ALT 31 33  ALKPHOS 82 74  BILITOT 1.3* 1.1  PROT 7.6 7.6  ALBUMIN 3.6 3.4*   CBC:  Recent Labs Lab 12/11/15 2245 12/12/15 0842 12/13/15 0335 12/14/15 0353 12/15/15 0415  WBC 14.1* 20.0* 20.9* 22.6* 28.7*  NEUTROABS 12.8* 18.4* 17.9*  --   --   HGB 8.4* 8.7* 7.4* 9.2* 8.4*  HCT 26.1* 27.5* 23.4* 27.6* 26.4*  MCV 86.7 88.1 89.0 86.3 89.2  PLT <5* 8* 6* 5* 10*    Recent Results (from the past 240 hour(s))  MRSA PCR Screening     Status: None   Collection Time: 12/12/15  2:06 PM  Result Value Ref Range Status   MRSA by PCR NEGATIVE NEGATIVE Final     Scheduled Meds: . amLODipine  10 mg Oral Daily   And  . irbesartan  300 mg Oral Daily  . atorvastatin  20 mg Oral q1800  . fluticasone  1 spray Each Nare Daily  . folic acid  1 mg Oral Daily  . Immune Globulin 10%  1 g/kg Intravenous Q24 Hr x 2  . loratadine  10 mg Oral Daily  . multivitamin with minerals  1 tablet Oral q morning - 10a  . mycophenolate  500 mg Oral BID  . pantoprazole  40 mg Oral Daily  . potassium chloride  20 mEq Oral Daily  . senna-docusate  1 tablet Oral BID  . sodium chloride  3 mL Intravenous Q12H   Continuous Infusions:

## 2015-12-15 NOTE — Progress Notes (Signed)
[name] is now on 3W.  His BMs he  states her still dark.  His hemoglobin is down to 8.4. Some of this might be dilutional from the IVIG.  he received his first dose yesterday. He will have a dose today.   His platelet count is 10,000. This hopefully will continue to trend upward. I was his blood count gets above 20,000, then he might be candidate for discharge.  He is not complaining of any pain. His appetite is doing okay. He's had no cough or shortness of breath.  He is on high-dose Decadron. I think he may have one last dose today.  On his physical exam, his vital signs are fairly stable. Temperature 97.9. Pulse 76. Blood pressure 120/73. His abdomen is soft. He has good bowel sounds. He has no guarding or rebound tenderness. His splint to be scar is well-healed. He has no hepatomegaly. Lungs are clear. Cardiac exam regular rate and rhythm with no murmurs, rubs or bruits. Extremities shows no clubbing, cyanosis or edema. Skin exam shows no ecchymoses.  He does have some leukocytosis from the Decadron.  He is on CellCept. He is tolerating this well. His renal function is looking good.  For now, we'll see how the second dose of IVIG goes.  Hopefully his platelets will be up tomorrow even more significantly.  I think he is getting outstanding care from everybody up on 3 W. The ICU staff was also an credible in taking care of him.   [name]  1 Vanita Cannell 5:10

## 2015-12-16 LAB — BASIC METABOLIC PANEL
ANION GAP: 6 (ref 5–15)
BUN: 30 mg/dL — ABNORMAL HIGH (ref 6–20)
CO2: 24 mmol/L (ref 22–32)
Calcium: 8.4 mg/dL — ABNORMAL LOW (ref 8.9–10.3)
Chloride: 107 mmol/L (ref 101–111)
Creatinine, Ser: 0.9 mg/dL (ref 0.61–1.24)
Glucose, Bld: 154 mg/dL — ABNORMAL HIGH (ref 65–99)
POTASSIUM: 3.9 mmol/L (ref 3.5–5.1)
SODIUM: 137 mmol/L (ref 135–145)

## 2015-12-16 LAB — CBC
HCT: 27.2 % — ABNORMAL LOW (ref 39.0–52.0)
HEMOGLOBIN: 8.6 g/dL — AB (ref 13.0–17.0)
MCH: 28.2 pg (ref 26.0–34.0)
MCHC: 31.6 g/dL (ref 30.0–36.0)
MCV: 89.2 fL (ref 78.0–100.0)
PLATELETS: 47 10*3/uL — AB (ref 150–400)
RBC: 3.05 MIL/uL — AB (ref 4.22–5.81)
RDW: 19.6 % — ABNORMAL HIGH (ref 11.5–15.5)
WBC: 25.8 10*3/uL — AB (ref 4.0–10.5)

## 2015-12-16 MED ORDER — MYCOPHENOLATE MOFETIL 250 MG PO CAPS: 500.0000 mg | ORAL_CAPSULE | Freq: Two times a day (BID) | ORAL | Status: DC

## 2015-12-16 NOTE — Discharge Summary (Signed)
Physician Discharge Summary  Scott Waters [id number] DOB: February 16, 1956 DOA: 12/11/2015  PCP: Scott Dawson, MD  Admit date: 12/11/2015 Discharge date: 12/16/2015  Recommendations for Outpatient Follow-up:  1. Pt will need to follow up with Dr. [name]  2. Please check CBC to evaluate Plt level   Discharge Diagnoses:  Principal Problem:   Gums, bleeding Active Problems:   Essential hypertension   Thrombocytopenia (HCC)   ITP (idiopathic thrombocytopenic purpura)   H/O splenectomy   Dyslipidemia   Anemia of chronic disease   Leukocytosis  Discharge Condition: Stable  Diet recommendation: Heart healthy diet discussed in details    Brief narrative:    60 year-old male with past medical history including but not limited to refractory immune thrombocytopeniawho follows with Dr. Lattie Haw. Patient presented to [facility] with worsening thrombocytopenia and bleeding from gums. Pt was diagnosed with ITP around 06/ 2015 and has had extensive treatments including high-dose steroids, splenectomy, Rituxan (in 08/2014), Nplate, Promacta, IVIG on several occasions. Most recently pt was on Promacta 75 mg by mouth daily which was not found to be very useful and was discontinued about a week prior to this admission.  Patient was hemodynamically stable on admission. Blood work was notable for hemoglobin of 9.2 (repeat 8.4) and platelets less than 5.   So far patient has received total of 5 units of platelets transfusion and 2 units of PRBC transfusion. IVIG started 1/19 and Cellcept started 1/18.   Assessment/Plan:    Principal Problem:  Gums, bleeding secondary to chronic refractory ITP / H/O splenectomy / Anemia of chronic disease  - Based on order history review, patient has received 5 units of platelets and total of 2 Units PRBC transfusion during this hospital stay - Patient has gotten Decadron 20 mg IV 1 time dosing ED and subsequently the following day he  received Decadron 40 mg IV. He also received Amicar on admission and this was stopped 12/14/2015. - Cellcept started 1/18 and IVIG started 12/14/2015. - Hemoglobin is stable - Platelets up to 47 K this AM - No acute findings on liver scan - Appreciate Dr. [name] following and his input  - pt ok for d/c with cellcept    Active Problems:  Leukocytosis - Likely from steroids  - No evidence of an acute infectious process   Dyslipidemia - Continue Lipitor 20 mg daily   Essential hypertension - continue home medical regimen   Code Status: Full.  Family Communication: plan of care discussed with the patient Disposition Plan: Home  IV access:  Peripheral IV  Procedures and diagnostic studies:   Dg Chest 2 View 12/11/2015 Marked improvement in right effusion and basilar airspace disease. Cardiomegaly. Electronically Signed By: Inge Rise M.D. 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   Medical Consultants:  Dr. [name], Hematology/oncology  Other Consultants:  None   IAnti-Infectives:   None   [name], MD [facility] Pager [phone]      Discharge Exam: Filed Vitals:   12/15/15 2048 12/16/15 0557  BP: 133/67 126/63  Pulse: 80 86  Temp: 98 F (36.7 C) 98.4 F (36.9 C)  Resp: 18 16   Filed Vitals:   12/15/15 1215 12/15/15 1415 12/15/15 2048 12/16/15 0557  BP: 119/76 124/78 133/67 126/63  Pulse: 82 82 80 86  Temp: 98.5 F (36.9 C) 98.7 F (37.1 C) 98 F (36.7 C) 98.4 F (36.9 C)  TempSrc: Oral Oral Oral Oral  Resp: 18 16 18 16   Height:      Weight:      SpO2: 99% 99% 100% 100%    General: Pt is alert, follows commands appropriately, not in acute distress Cardiovascular: Regular rate and rhythm, S1/S2 +, no murmurs, no rubs, no gallops Respiratory: Clear to auscultation bilaterally, no wheezing, no crackles, no  rhonchi Abdominal: Soft, non tender, non distended, bowel sounds +, no guarding Extremities: no edema, no cyanosis, pulses palpable bilaterally DP and PT Neuro: Grossly nonfocal  Discharge Instructions  Discharge Instructions    Diet - low sodium heart healthy    Complete by:  As directed      Increase activity slowly    Complete by:  As directed             Medication List    TAKE these medications        AZOR 10-40 MG tablet  Generic drug:  amLODipine-olmesartan  Take 1 tablet by mouth every morning.     CINNAMON PO  Take 2 capsules by mouth daily.     fexofenadine 180 MG tablet  Commonly known as:  ALLEGRA  Take 180 mg by mouth daily as needed for allergies.     fluticasone 50 MCG/ACT nasal spray  Commonly known as:  FLONASE  instill 2 sprays into each nostril once daily     folic acid 1 MG tablet  Commonly known as:  FOLVITE  Take 1 tablet (1 mg total) by mouth daily.     multivitamin with minerals Tabs tablet  Take 1 tablet by mouth every morning.     mycophenolate 250 MG capsule  Commonly known as:  CELLCEPT  Take 2 capsules (500 mg total) by mouth 2 (two) times daily.     pantoprazole 40 MG tablet  Commonly known as:  PROTONIX  Take 1 tablet (40 mg total) by mouth daily.     potassium chloride 10 MEQ tablet  Commonly known as:  K-DUR,KLOR-CON  take 2 tablets by mouth once daily     PROBIOTIC DAILY PO  Take 1 tablet by mouth 2 (two) times a week.     simvastatin 40 MG tablet  Commonly known as:  ZOCOR  Take 40 mg by mouth daily at 6 PM.           Follow-up Information    Follow up with Scott Dawson, MD.   Specialties:  Internal Medicine, Pediatrics   Contact information:   [place] [address] [phone]        The results of significant diagnostics from this hospitalization (including imaging, microbiology, ancillary and laboratory) are listed below for reference.     Microbiology: Recent Results  (from the past 240 hour(s))  MRSA PCR Screening     Status: None   Collection Time: 12/12/15  2:06 PM  Result Value Ref Range Status   MRSA by PCR NEGATIVE NEGATIVE Final    Comment:        The GeneXpert MRSA Assay (FDA approved for NASAL specimens only), is one component of a comprehensive MRSA colonization surveillance program. It is not intended to diagnose MRSA infection nor to guide or monitor treatment for MRSA infections.      Labs: Basic Metabolic Panel:  Recent Labs Lab 12/11/15 1422 12/12/15 0842 12/14/15 0353 12/15/15 0415 12/16/15 0431  NA 138 137 139 139 137  K 3.8 4.7 3.9 3.8 3.9  CL 106 106 109 108 107  CO2 23 25 22  24 24  GLUCOSE 78 140* 179* 116* 154*  BUN 22* 20 29* 30* 30*  CREATININE 0.86 0.86 0.87 0.78 0.90  CALCIUM 8.6* 9.1 8.4* 8.1* 8.4*   Liver Function Tests:  Recent Labs Lab 12/11/15 1422 12/12/15 0842  AST 47* 49*  ALT 31 33  ALKPHOS 82 74  BILITOT 1.3* 1.1  PROT 7.6 7.6  ALBUMIN 3.6 3.4*   CBC:  Recent Labs Lab 12/11/15 2245 12/12/15 0842 12/13/15 0335 12/14/15 0353 12/15/15 0415 12/16/15 0431  WBC 14.1* 20.0* 20.9* 22.6* 28.7* 25.8*  NEUTROABS 12.8* 18.4* 17.9*  --   --   --   HGB 8.4* 8.7* 7.4* 9.2* 8.4* 8.6*  HCT 26.1* 27.5* 23.4* 27.6* 26.4* 27.2*  MCV 86.7 88.1 89.0 86.3 89.2 89.2  PLT <5* 8* 6* 5* 10* 47*   SIGNED: Time coordinating discharge: 30 minutes  MAGICK-Emmanuel Ercole, MD  [facility] 12/16/2015, 11:00 AM Pager [phone]  If 7PM-7AM, please contact night-coverage [email or web address] Password [email or web address]

## 2015-12-16 NOTE — Discharge Instructions (Signed)
Thrombocytopenia °Thrombocytopenia is a condition in which there is an abnormally small number of platelets in your blood. Platelets are also called thrombocytes. Platelets are needed for blood clotting. °CAUSES °Thrombocytopenia is caused by:  °· Decreased production of platelets. This can be caused by: °¨ Aplastic anemia in which your bone marrow quits making blood cells. °¨ Cancer in the bone marrow. °¨ Use of certain medicines, including chemotherapy. °¨ Infection in the bone marrow. °¨ Heavy alcohol consumption. °· Increased destruction of platelets. This can be caused by: °¨ Certain immune diseases. °¨ Use of certain drugs. °¨ Certain blood clotting disorders. °¨ Certain inherited disorders. °¨ Certain bleeding disorders. °¨ Pregnancy. °· Having an enlarged spleen (hypersplenism). In hypersplenism, the spleen gathers up platelets from circulation. This means the platelets are not available to help with blood clotting. The spleen can enlarge due to cirrhosis or other conditions. °SYMPTOMS  °The symptoms of thrombocytopenia are side effects of poor blood clotting. Some of these are: °· Abnormal bleeding. °· Nosebleeds. °· Heavy menstrual periods. °· Blood in the urine or stools. °· Purpura. This is a purplish discoloration in the skin produced by small bleeding vessels near the surface of the skin. °· Bruising. °· A rash that may be petechial. This looks like pinpoint, purplish-red spots on the skin and mucous membranes. It is caused by bleeding from small blood vessels (capillaries). °DIAGNOSIS  °Your caregiver will make this diagnosis based on your exam and blood tests. Sometimes, a bone marrow study is done to look for the original cells (megakaryocytes) that make platelets. °TREATMENT  °Treatment depends on the cause of the condition. °· Medicines may be given to help protect your platelets from being destroyed. °· In some cases, a replacement (transfusion) of platelets may be required to stop or prevent  bleeding. °· Sometimes, the spleen must be surgically removed. °HOME CARE INSTRUCTIONS  °· Check the skin and linings inside your mouth for bruising or bleeding as directed by your caregiver. °· Check your sputum, urine, and stool for blood as directed by your caregiver. °· Do not return to any activities that could cause bumps or bruises until your caregiver says it is okay. °· Take extra care not to cut yourself when shaving or when using scissors, needles, knives, and other tools. °· Take extra care not to burn yourself when ironing or cooking. °· Ask your caregiver if it is okay for you to drink alcohol. °· Only take over-the-counter or prescription medicines as directed by your caregiver. °· Notify all your caregivers, including dentists and eye doctors, about your condition. °SEEK IMMEDIATE MEDICAL CARE IF:  °· You develop active bleeding from anywhere in your body. °· You develop unexplained bruising or bleeding. °· You have blood in your sputum, urine, or stool. °MAKE SURE YOU: °· Understand these instructions. °· Will watch your condition. °· Will get help right away if you are not doing well or get worse. °  °This information is not intended to replace advice given to you by your health care provider. Make sure you discuss any questions you have with your health care provider. °  °Document Released: 11/11/2005 Document Revised: 02/03/2012 Document Reviewed: 05/15/2015 °Elsevier Interactive Patient Education ©2016 [employer] ° °

## 2015-12-16 NOTE — Progress Notes (Signed)
Scott Waters is doing pretty well this morning. His platelet count is 47,000. This is a nice bump up from 10,000. He is not bleeding. He is tolerating the CellCept pretty well.  He did okay with the IVIG.  He is eating. He's had no cough. He's had no shortness of breath.  His hemoglobin is 8.6. I think some of this is dilutional from the IVIG. His white cell count is 25.8. I think this is up from the Decadron.  His vital signs were all stable. His blood pressure is 126/63. Temperature 98.4. His lungs are clear. Cardiac exam regular rate and rhythm with no murmurs, rubs or bruits. Abdomen is soft area and has no fluid wave. There is no palpable hepatomegaly. Extremities shows no clubbing, cyanosis or edema.  I think that he go home. He must have the CellCept prescription.  He has appointment to see [place] on Wednesday. He will keep that appointment. Hopefully, his platelet count will continue to trend up [place]. I think that we can keep his platelet count above 20,000, then we will be doing okay.  I appreciate all the great care that he received while on 3 W.  [name]  Hebrews 13:17

## 2015-12-16 NOTE — Progress Notes (Signed)
Discharge instructions reviewed with patient, questions answered, verbalized understanding.  Rx for Cellcept given to patient as well asinformation sheet regarding Cellcept as this is a new medication for him.  Patient transported to front of hospital via wheelchair to be taken to his car by security as he drove himself to the hospital.

## 2015-12-20 ENCOUNTER — Encounter: Payer: Self-pay | Admitting: *Deleted

## 2015-12-20 ENCOUNTER — Other Ambulatory Visit (HOSPITAL_BASED_OUTPATIENT_CLINIC_OR_DEPARTMENT_OTHER): Payer: BLUE CROSS/BLUE SHIELD

## 2015-12-20 ENCOUNTER — Telehealth: Payer: Self-pay | Admitting: *Deleted

## 2015-12-20 DIAGNOSIS — Z9081 Acquired absence of spleen: Secondary | ICD-10-CM

## 2015-12-20 DIAGNOSIS — D693 Immune thrombocytopenic purpura: Secondary | ICD-10-CM

## 2015-12-20 LAB — CBC WITH DIFFERENTIAL (CANCER CENTER ONLY)
BASO#: 0 10*3/uL (ref 0.0–0.2)
BASO%: 0.2 % (ref 0.0–2.0)
EOS ABS: 0.1 10*3/uL (ref 0.0–0.5)
EOS%: 0.5 % (ref 0.0–7.0)
HCT: 26.8 % — ABNORMAL LOW (ref 38.7–49.9)
HEMOGLOBIN: 8.6 g/dL — AB (ref 13.0–17.1)
LYMPH#: 1.8 10*3/uL (ref 0.9–3.3)
LYMPH%: 11.4 % — AB (ref 14.0–48.0)
MCH: 28.1 pg (ref 28.0–33.4)
MCHC: 32.1 g/dL (ref 32.0–35.9)
MCV: 88 fL (ref 82–98)
MONO#: 1.2 10*3/uL — ABNORMAL HIGH (ref 0.1–0.9)
MONO%: 7.5 % (ref 0.0–13.0)
NEUT%: 80.4 % — AB (ref 40.0–80.0)
NEUTROS ABS: 12.4 10*3/uL — AB (ref 1.5–6.5)
PLATELETS: 126 10*3/uL — AB (ref 145–400)
RBC: 3.06 10*6/uL — ABNORMAL LOW (ref 4.20–5.70)
RDW: 18.4 % — AB (ref 11.1–15.7)
WBC: 15.4 10*3/uL — ABNORMAL HIGH (ref 4.0–10.0)

## 2015-12-20 LAB — TECHNOLOGIST REVIEW CHCC SATELLITE

## 2015-12-20 NOTE — Telephone Encounter (Signed)
Error

## 2015-12-22 ENCOUNTER — Telehealth: Payer: Self-pay | Admitting: Hematology & Oncology

## 2015-12-22 NOTE — Telephone Encounter (Signed)
Faxed medical records and completed form to: [facility] DISABILTY P: [id number] F: [phone]   Leave Id: [id number]  Leave Dates: 12/12/2015 - 01/01/2016      COPY SCANNED

## 2015-12-27 ENCOUNTER — Other Ambulatory Visit (HOSPITAL_BASED_OUTPATIENT_CLINIC_OR_DEPARTMENT_OTHER): Payer: BLUE CROSS/BLUE SHIELD

## 2015-12-27 ENCOUNTER — Encounter: Payer: Self-pay | Admitting: Family

## 2015-12-27 ENCOUNTER — Telehealth: Payer: Self-pay | Admitting: *Deleted

## 2015-12-27 ENCOUNTER — Ambulatory Visit (HOSPITAL_BASED_OUTPATIENT_CLINIC_OR_DEPARTMENT_OTHER)
Admission: RE | Admit: 2015-12-27 | Discharge: 2015-12-27 | Disposition: A | Discharge status: Home / Self Care | Payer: BLUE CROSS/BLUE SHIELD | Source: Ambulatory Visit | Attending: Family | Admitting: Family

## 2015-12-27 ENCOUNTER — Ambulatory Visit (HOSPITAL_BASED_OUTPATIENT_CLINIC_OR_DEPARTMENT_OTHER): Payer: BLUE CROSS/BLUE SHIELD | Admitting: Family

## 2015-12-27 ENCOUNTER — Ambulatory Visit (HOSPITAL_BASED_OUTPATIENT_CLINIC_OR_DEPARTMENT_OTHER): Payer: BLUE CROSS/BLUE SHIELD

## 2015-12-27 VITALS — BP 121/69 | HR 91 | Temp 99.3°F | Resp 18

## 2015-12-27 VITALS — BP 111/55 | HR 108 | Temp 98.7°F | Resp 16 | Ht 68.0 in | Wt 183.0 lb

## 2015-12-27 DIAGNOSIS — R918 Other nonspecific abnormal finding of lung field: Secondary | ICD-10-CM | POA: Insufficient documentation

## 2015-12-27 DIAGNOSIS — D693 Immune thrombocytopenic purpura: Secondary | ICD-10-CM

## 2015-12-27 DIAGNOSIS — R05 Cough: Secondary | ICD-10-CM | POA: Insufficient documentation

## 2015-12-27 DIAGNOSIS — R040 Epistaxis: Secondary | ICD-10-CM | POA: Diagnosis not present

## 2015-12-27 DIAGNOSIS — J90 Pleural effusion, not elsewhere classified: Secondary | ICD-10-CM | POA: Diagnosis not present

## 2015-12-27 DIAGNOSIS — R059 Cough, unspecified: Secondary | ICD-10-CM

## 2015-12-27 DIAGNOSIS — Z9081 Acquired absence of spleen: Secondary | ICD-10-CM

## 2015-12-27 DIAGNOSIS — J189 Pneumonia, unspecified organism: Secondary | ICD-10-CM

## 2015-12-27 DIAGNOSIS — R0602 Shortness of breath: Secondary | ICD-10-CM | POA: Diagnosis not present

## 2015-12-27 DIAGNOSIS — J181 Lobar pneumonia, unspecified organism: Secondary | ICD-10-CM

## 2015-12-27 LAB — CBC WITH DIFFERENTIAL (CANCER CENTER ONLY)
BASO#: 0 10*3/uL (ref 0.0–0.2)
BASO%: 0.1 % (ref 0.0–2.0)
EOS%: 0 % (ref 0.0–7.0)
Eosinophils Absolute: 0 10*3/uL (ref 0.0–0.5)
HCT: 27.1 % — ABNORMAL LOW (ref 38.7–49.9)
HEMOGLOBIN: 8.6 g/dL — AB (ref 13.0–17.1)
LYMPH#: 1.2 10*3/uL (ref 0.9–3.3)
LYMPH%: 4.4 % — AB (ref 14.0–48.0)
MCH: 27.8 pg — AB (ref 28.0–33.4)
MCHC: 31.7 g/dL — AB (ref 32.0–35.9)
MCV: 88 fL (ref 82–98)
MONO#: 2.7 10*3/uL — AB (ref 0.1–0.9)
MONO%: 10 % (ref 0.0–13.0)
NEUT#: 22.7 10*3/uL — ABNORMAL HIGH (ref 1.5–6.5)
NEUT%: 85.5 % — ABNORMAL HIGH (ref 40.0–80.0)
RBC: 3.09 10*6/uL — AB (ref 4.20–5.70)
RDW: 17.6 % — ABNORMAL HIGH (ref 11.1–15.7)
WBC: 26.6 10*3/uL — ABNORMAL HIGH (ref 4.0–10.0)

## 2015-12-27 MED ORDER — DIPHENHYDRAMINE HCL 25 MG PO CAPS
ORAL_CAPSULE | ORAL | Status: AC
  Filled 2015-12-27: qty 1

## 2015-12-27 MED ORDER — IMMUNE GLOBULIN (HUMAN) 5 GM/100ML IV SOLN: 1.0000 g/kg | Freq: Once | INTRAVENOUS | Status: DC

## 2015-12-27 MED ORDER — AZITHROMYCIN 250 MG PO TABS: ORAL_TABLET | ORAL | Status: AC

## 2015-12-27 MED ORDER — SODIUM CHLORIDE 0.9 % IV SOLN
INTRAVENOUS | Status: DC
  Administered 2015-12-27: 13:00:00 via INTRAVENOUS

## 2015-12-27 MED ORDER — ACETAMINOPHEN 325 MG PO TABS
ORAL_TABLET | ORAL | Status: AC
  Filled 2015-12-27: qty 2

## 2015-12-27 MED ORDER — ACETAMINOPHEN 325 MG PO TABS
650.0000 mg | ORAL_TABLET | Freq: Once | ORAL | Status: AC
  Administered 2015-12-27: 650 mg via ORAL

## 2015-12-27 MED ORDER — IMMUNE GLOBULIN (HUMAN) 20 GM/200ML IV SOLN
1.0000 g/kg | Freq: Once | INTRAVENOUS | Status: AC
  Administered 2015-12-27: 80 g via INTRAVENOUS
  Filled 2015-12-27: qty 800

## 2015-12-27 MED ORDER — DIPHENHYDRAMINE HCL 25 MG PO CAPS
25.0000 mg | ORAL_CAPSULE | Freq: Once | ORAL | Status: AC
  Administered 2015-12-27: 25 mg via ORAL

## 2015-12-27 NOTE — Telephone Encounter (Signed)
Critical Value Platelets <6 Dr [name] notified. No orders at this time.

## 2015-12-27 NOTE — Progress Notes (Signed)
Hematology and Oncology Follow Up Visit  Haydin Dibartolo [id number] [birth year] 60 y.o. 12/27/2015   Principle Diagnosis:  Refractory immune thrombocytopenia - s/p splenectomy   Current Therapy:   IVIG as needed    Interim History:  Mr. Kostelnik is here today for for a follow-up. He is feeling a little better today but has noticed his gums bleeding and he has also had a nose bleed that resolved quickly. He states that his stool was dark for several days but is now back to "normal."  His platelet count today is <6. His Hgb is holding at 8.6 with an MCV of 88.  He has a productive cough and can here "gurggles" in his chest when he breaths while laying on his left side. We will get a chest xray on him today and see what that shows.  No fever, chills, n/v, rash, dizziness, SOB, chest pain, palpitations, abdominal pain or changes in bowel or bladder habits.  No swelling, tenderness, numbness or tingling in his extremities at this time. No c/o pain.  His appetite has been good and he has stayed hydrated. His weight is unchanged.  He has been off of work for the last 3 weeks due to being in the hospital and has enjoyed resting.   Medications:    Medication List       This list is accurate as of: 12/27/15  1:26 PM.  Always use your most recent med list.               AZOR 10-40 MG tablet  Generic drug:  amLODipine-olmesartan  Take 1 tablet by mouth every morning.     CINNAMON PO  Take 2 capsules by mouth daily.     fexofenadine 180 MG tablet  Commonly known as:  ALLEGRA  Take 180 mg by mouth daily as needed for allergies.     fluticasone 50 MCG/ACT nasal spray  Commonly known as:  FLONASE  instill 2 sprays into each nostril once daily     folic acid 1 MG tablet  Commonly known as:  FOLVITE  Take 1 tablet (1 mg total) by mouth daily.     multivitamin with minerals Tabs tablet  Take 1 tablet by mouth every morning.     mycophenolate 250 MG capsule  Commonly known as:  CELLCEPT    Take 2 capsules (500 mg total) by mouth 2 (two) times daily.     pantoprazole 40 MG tablet  Commonly known as:  PROTONIX  Take 1 tablet (40 mg total) by mouth daily.     potassium chloride 10 MEQ tablet  Commonly known as:  K-DUR,KLOR-CON  take 2 tablets by mouth once daily     PROBIOTIC DAILY PO  Take 1 tablet by mouth 2 (two) times a week.     simvastatin 40 MG tablet  Commonly known as:  ZOCOR  Take 40 mg by mouth daily at 6 PM.        Allergies: No Known Allergies  Past Medical History, Surgical history, Social history, and Family History were reviewed and updated.  Review of Systems: All other 10 point review of systems is negative.   Physical Exam:  height is 5\' 8"  (1.727 m) and weight is 183 lb (83.008 kg). His oral temperature is 98.7 F (37.1 C). His blood pressure is 111/55 and his pulse is 108. His respiration is 16.   Wt Readings from Last 3 Encounters:  12/27/15 183 lb (83.008 kg)  12/15/15 189 lb (85.73 kg)  12/06/15 188 lb (85.276 kg)    Ocular: Sclerae unicteric, pupils equal, round and reactive to light Ear-nose-throat: Oropharynx clear, dentition fair Lymphatic: No cervical supraclavicular or axillary adenopathy Lungs no rales or rhonchi, good excursion bilaterally, lung sounds slightly diminished on the left side Heart regular rate and rhythm, no murmur appreciated Abd soft, nontender, positive bowel sounds, no liver tip palpated on exam MSK no focal spinal tenderness, no joint edema Neuro: non-focal, well-oriented, appropriate affect Breasts: Deferred  Lab Results  Component Value Date   WBC 26.6* 12/27/2015   HGB 8.6* 12/27/2015   HCT 27.1* 12/27/2015   MCV 88 12/27/2015   PLT <6* 12/27/2015   No results found for: FERRITIN, IRON, TIBC, UIBC, IRONPCTSAT Lab Results  Component Value Date   RETICCTPCT 2.6* 05/13/2014   RBC 3.09* 12/27/2015   RETICCTABS 91.0 05/13/2014   No results found for: Nils Pyle [facility]  Results  Component Value Date   IGGSERUM 2677* 12/11/2015   IGA 181 12/11/2015   IGMSERUM 26 12/11/2015   Lab Results  Component Value Date   TOTALPROTELP 7.2 12/11/2015     Chemistry      Component Value Date/Time   NA 137 12/16/2015 0431   NA 139 07/20/2014 0902   K 3.9 12/16/2015 0431   K 3.3 07/20/2014 0902   CL 107 12/16/2015 0431   CL 103 07/20/2014 0902   CO2 24 12/16/2015 0431   CO2 27 07/20/2014 0902   BUN 30* 12/16/2015 0431   BUN 11 07/20/2014 0902   CREATININE 0.90 12/16/2015 0431   CREATININE 0.9 07/20/2014 0902      Component Value Date/Time   CALCIUM 8.4* 12/16/2015 0431   CALCIUM 8.6 07/20/2014 0902   ALKPHOS 74 12/12/2015 0842   ALKPHOS 75 07/20/2014 0902   AST 49* 12/12/2015 0842   AST 51* 07/20/2014 0902   ALT 33 12/12/2015 0842   ALT 36 07/20/2014 0902   BILITOT 1.1 12/12/2015 0842   BILITOT 0.80 07/20/2014 0902     Impression and Plan: Mr. Curtiss is a pleasant 60 yo gentleman with refractory immune thrombocytopenia. He is symptomatic with a nose bleed and bleeding gums.  We will give him a dose of IVIG today.  He also has a productive cough and his chest xray revealed a left sided pneumonia.  We will have him try a z-pack. He will call us on Friday and let us know how he is feeling.  We will continue to check his lab work weekly and plan to see him back in 3 weeks for labs and follow-up.  He knows to contact us with any questions or concerns. We can certainly see her sooner if need be.   Eliezer Bottom, NP 2/1/20171:26 PM

## 2015-12-27 NOTE — Patient Instructions (Signed)

## 2016-01-03 ENCOUNTER — Telehealth: Payer: Self-pay | Admitting: *Deleted

## 2016-01-03 ENCOUNTER — Other Ambulatory Visit: Payer: Self-pay | Admitting: Family

## 2016-01-03 ENCOUNTER — Ambulatory Visit (HOSPITAL_COMMUNITY)
Admission: RE | Admit: 2016-01-03 | Discharge: 2016-01-03 | Disposition: A | Discharge status: Home / Self Care | Payer: BLUE CROSS/BLUE SHIELD | Source: Ambulatory Visit | Attending: Hematology & Oncology | Admitting: Hematology & Oncology

## 2016-01-03 ENCOUNTER — Other Ambulatory Visit (HOSPITAL_BASED_OUTPATIENT_CLINIC_OR_DEPARTMENT_OTHER): Payer: BLUE CROSS/BLUE SHIELD

## 2016-01-03 ENCOUNTER — Other Ambulatory Visit: Payer: Self-pay | Admitting: *Deleted

## 2016-01-03 DIAGNOSIS — D638 Anemia in other chronic diseases classified elsewhere: Secondary | ICD-10-CM | POA: Insufficient documentation

## 2016-01-03 DIAGNOSIS — D693 Immune thrombocytopenic purpura: Secondary | ICD-10-CM | POA: Insufficient documentation

## 2016-01-03 DIAGNOSIS — Z9081 Acquired absence of spleen: Secondary | ICD-10-CM

## 2016-01-03 DIAGNOSIS — D696 Thrombocytopenia, unspecified: Secondary | ICD-10-CM

## 2016-01-03 LAB — CBC WITH DIFFERENTIAL (CANCER CENTER ONLY)
BASO#: 0 10*3/uL (ref 0.0–0.2)
BASO%: 0.4 % (ref 0.0–2.0)
EOS%: 0.4 % (ref 0.0–7.0)
Eosinophils Absolute: 0 10*3/uL (ref 0.0–0.5)
HEMATOCRIT: 21.6 % — AB (ref 38.7–49.9)
HEMOGLOBIN: 6.5 g/dL — AB (ref 13.0–17.1)
LYMPH#: 2.4 10*3/uL (ref 0.9–3.3)
LYMPH%: 22.8 % (ref 14.0–48.0)
MCH: 26.6 pg — ABNORMAL LOW (ref 28.0–33.4)
MCHC: 30.1 g/dL — ABNORMAL LOW (ref 32.0–35.9)
MCV: 89 fL (ref 82–98)
MONO#: 0.9 10*3/uL (ref 0.1–0.9)
MONO%: 8.5 % (ref 0.0–13.0)
NEUT%: 67.9 % (ref 40.0–80.0)
NEUTROS ABS: 7.2 10*3/uL — AB (ref 1.5–6.5)
Platelets: <6 10*3/uL — CL (ref 145–400)
RBC: 2.44 10*6/uL — AB (ref 4.20–5.70)
RDW: 18 % — ABNORMAL HIGH (ref 11.1–15.7)

## 2016-01-03 LAB — TECHNOLOGIST REVIEW CHCC SATELLITE

## 2016-01-03 MED ORDER — FOLIC ACID 1 MG PO TABS: 1.0000 mg | ORAL_TABLET | Freq: Every day | ORAL | Status: AC

## 2016-01-03 NOTE — Telephone Encounter (Signed)
Critical Value Hgb 6.5 Platelets <6 Dr [name] notified. Orders will be placed.

## 2016-01-03 NOTE — Telephone Encounter (Signed)
[facility] Refill request  1 - Pot Chlor ER (Klorcon M10) Tabs, 90 day supply with 4 refills 2 - Simvastatin 40 mg, 90 day supply with 4 refills

## 2016-01-04 ENCOUNTER — Ambulatory Visit (HOSPITAL_BASED_OUTPATIENT_CLINIC_OR_DEPARTMENT_OTHER): Payer: BLUE CROSS/BLUE SHIELD

## 2016-01-04 VITALS — BP 128/79 | HR 88 | Temp 98.2°F | Resp 18

## 2016-01-04 DIAGNOSIS — D693 Immune thrombocytopenic purpura: Secondary | ICD-10-CM

## 2016-01-04 DIAGNOSIS — D638 Anemia in other chronic diseases classified elsewhere: Secondary | ICD-10-CM | POA: Diagnosis not present

## 2016-01-04 LAB — PREPARE RBC (CROSSMATCH)

## 2016-01-04 MED ORDER — ACETAMINOPHEN 325 MG PO TABS
ORAL_TABLET | ORAL | Status: AC
  Filled 2016-01-04: qty 2

## 2016-01-04 MED ORDER — SIMVASTATIN 40 MG PO TABS: 40.0000 mg | ORAL_TABLET | Freq: Every day | ORAL | Status: AC

## 2016-01-04 MED ORDER — POTASSIUM CHLORIDE CRYS ER 10 MEQ PO TBCR: 20.0000 meq | EXTENDED_RELEASE_TABLET | Freq: Every day | ORAL | Status: AC

## 2016-01-04 MED ORDER — DIPHENHYDRAMINE HCL 25 MG PO CAPS
25.0000 mg | ORAL_CAPSULE | Freq: Once | ORAL | Status: AC
  Administered 2016-01-04: 25 mg via ORAL

## 2016-01-04 MED ORDER — DIPHENHYDRAMINE HCL 25 MG PO CAPS
ORAL_CAPSULE | ORAL | Status: AC
  Filled 2016-01-04: qty 1

## 2016-01-04 MED ORDER — SODIUM CHLORIDE 0.9 % IV SOLN
250.0000 mL | Freq: Once | INTRAVENOUS | Status: AC
  Administered 2016-01-04: 250 mL via INTRAVENOUS

## 2016-01-04 MED ORDER — ACETAMINOPHEN 325 MG PO TABS
650.0000 mg | ORAL_TABLET | Freq: Once | ORAL | Status: AC
  Administered 2016-01-04: 650 mg via ORAL

## 2016-01-04 MED ORDER — FUROSEMIDE 10 MG/ML IJ SOLN: 20.0000 mg | Freq: Once | INTRAMUSCULAR | Status: DC

## 2016-01-04 NOTE — Patient Instructions (Signed)

## 2016-01-04 NOTE — Telephone Encounter (Signed)
Sent to the pharmacy by e-scribe. 

## 2016-01-05 LAB — TYPE AND SCREEN
ABO/RH(D): A POS
ANTIBODY SCREEN: POSITIVE
DAT, IgG: POSITIVE
UNIT DIVISION: 0
Unit division: 0

## 2016-01-05 LAB — PREPARE PLATELET PHERESIS: UNIT DIVISION: 0

## 2016-01-08 ENCOUNTER — Other Ambulatory Visit (HOSPITAL_BASED_OUTPATIENT_CLINIC_OR_DEPARTMENT_OTHER): Payer: BLUE CROSS/BLUE SHIELD

## 2016-01-08 ENCOUNTER — Telehealth: Payer: Self-pay | Admitting: *Deleted

## 2016-01-08 ENCOUNTER — Other Ambulatory Visit: Payer: Self-pay | Admitting: Hematology & Oncology

## 2016-01-08 DIAGNOSIS — D693 Immune thrombocytopenic purpura: Secondary | ICD-10-CM

## 2016-01-08 DIAGNOSIS — Z9081 Acquired absence of spleen: Secondary | ICD-10-CM

## 2016-01-08 LAB — CBC WITH DIFFERENTIAL (CANCER CENTER ONLY)
BASO#: 0 10*3/uL (ref 0.0–0.2)
BASO%: 0.5 % (ref 0.0–2.0)
EOS%: 0.8 % (ref 0.0–7.0)
Eosinophils Absolute: 0.1 10*3/uL (ref 0.0–0.5)
HEMATOCRIT: 25.1 % — AB (ref 38.7–49.9)
HGB: 7.7 g/dL — ABNORMAL LOW (ref 13.0–17.1)
LYMPH#: 1.3 10*3/uL (ref 0.9–3.3)
LYMPH%: 20.5 % (ref 14.0–48.0)
MCH: 26 pg — AB (ref 28.0–33.4)
MCHC: 30.7 g/dL — AB (ref 32.0–35.9)
MCV: 85 fL (ref 82–98)
MONO#: 1 10*3/uL — ABNORMAL HIGH (ref 0.1–0.9)
MONO%: 15.7 % — AB (ref 0.0–13.0)
NEUT#: 4.1 10*3/uL (ref 1.5–6.5)
NEUT%: 62.5 % (ref 40.0–80.0)
Platelets: <6 10*3/uL — CL (ref 145–400)
RBC: 2.96 10*6/uL — ABNORMAL LOW (ref 4.20–5.70)
RDW: 17.3 % — AB (ref 11.1–15.7)
WBC: 6.5 10*3/uL (ref 4.0–10.0)

## 2016-01-08 LAB — TECHNOLOGIST REVIEW CHCC SATELLITE

## 2016-01-08 MED ORDER — DEXAMETHASONE 4 MG PO TABS: ORAL_TABLET | ORAL | Status: DC

## 2016-01-08 NOTE — Telephone Encounter (Signed)
Critical Value Platelet <6 Dr [name] notified. Orders received.

## 2016-01-09 ENCOUNTER — Ambulatory Visit (HOSPITAL_BASED_OUTPATIENT_CLINIC_OR_DEPARTMENT_OTHER): Payer: BLUE CROSS/BLUE SHIELD

## 2016-01-09 VITALS — BP 125/75 | HR 90 | Temp 97.9°F | Resp 18

## 2016-01-09 DIAGNOSIS — D693 Immune thrombocytopenic purpura: Secondary | ICD-10-CM | POA: Diagnosis not present

## 2016-01-09 DIAGNOSIS — Z9081 Acquired absence of spleen: Secondary | ICD-10-CM

## 2016-01-09 MED ORDER — DIPHENHYDRAMINE HCL 25 MG PO CAPS
25.0000 mg | ORAL_CAPSULE | Freq: Once | ORAL | Status: AC
  Administered 2016-01-09: 25 mg via ORAL

## 2016-01-09 MED ORDER — ACETAMINOPHEN 325 MG PO TABS
650.0000 mg | ORAL_TABLET | Freq: Once | ORAL | Status: AC
  Administered 2016-01-09: 650 mg via ORAL

## 2016-01-09 MED ORDER — PRIVIGEN 10 GM/100ML IV SOLN: 80.0000 g | Freq: Once | INTRAVENOUS | Status: DC

## 2016-01-09 MED ORDER — DIPHENHYDRAMINE HCL 25 MG PO CAPS
ORAL_CAPSULE | ORAL | Status: AC
  Filled 2016-01-09: qty 1

## 2016-01-09 MED ORDER — IMMUNE GLOBULIN (HUMAN) 20 GM/200ML IV SOLN: 1.0000 g/kg | Freq: Once | INTRAVENOUS | Status: DC

## 2016-01-09 MED ORDER — PRIVIGEN 10 GM/100ML IV SOLN
80.0000 g | Freq: Once | INTRAVENOUS | Status: AC
  Administered 2016-01-09: 80 g via INTRAVENOUS
  Filled 2016-01-09: qty 800

## 2016-01-09 MED ORDER — IMMUNE GLOBULIN (HUMAN) 20 GM/200ML IV SOLN
1.0000 g/kg | Freq: Once | INTRAVENOUS | Status: DC
  Filled 2016-01-09: qty 800

## 2016-01-09 MED ORDER — ACETAMINOPHEN 325 MG PO TABS
ORAL_TABLET | ORAL | Status: AC
  Filled 2016-01-09: qty 2

## 2016-01-09 NOTE — Patient Instructions (Signed)

## 2016-01-10 ENCOUNTER — Other Ambulatory Visit: Payer: BLUE CROSS/BLUE SHIELD

## 2016-01-10 ENCOUNTER — Ambulatory Visit (HOSPITAL_BASED_OUTPATIENT_CLINIC_OR_DEPARTMENT_OTHER): Payer: BLUE CROSS/BLUE SHIELD

## 2016-01-10 VITALS — BP 114/80 | HR 90 | Temp 97.9°F | Resp 18

## 2016-01-10 DIAGNOSIS — D693 Immune thrombocytopenic purpura: Secondary | ICD-10-CM | POA: Diagnosis not present

## 2016-01-10 DIAGNOSIS — Z9081 Acquired absence of spleen: Secondary | ICD-10-CM

## 2016-01-10 MED ORDER — DIPHENHYDRAMINE HCL 25 MG PO CAPS
ORAL_CAPSULE | ORAL | Status: AC
  Filled 2016-01-10: qty 1

## 2016-01-10 MED ORDER — IMMUNE GLOBULIN (HUMAN) 20 GM/200ML IV SOLN
80.0000 g | Freq: Once | INTRAVENOUS | Status: AC
  Administered 2016-01-10: 80 g via INTRAVENOUS
  Filled 2016-01-10: qty 800

## 2016-01-10 MED ORDER — ACETAMINOPHEN 325 MG PO TABS
ORAL_TABLET | ORAL | Status: AC
  Filled 2016-01-10: qty 1

## 2016-01-10 MED ORDER — DIPHENHYDRAMINE HCL 25 MG PO CAPS
25.0000 mg | ORAL_CAPSULE | Freq: Once | ORAL | Status: AC
  Administered 2016-01-10: 25 mg via ORAL

## 2016-01-10 MED ORDER — ACETAMINOPHEN 325 MG PO TABS
650.0000 mg | ORAL_TABLET | Freq: Once | ORAL | Status: AC
  Administered 2016-01-10: 650 mg via ORAL

## 2016-01-10 MED ORDER — ACETAMINOPHEN 325 MG PO TABS
ORAL_TABLET | ORAL | Status: AC
  Filled 2016-01-10: qty 2

## 2016-01-10 NOTE — Patient Instructions (Signed)

## 2016-01-16 ENCOUNTER — Encounter: Payer: Self-pay | Admitting: Hematology & Oncology

## 2016-01-17 ENCOUNTER — Ambulatory Visit (HOSPITAL_BASED_OUTPATIENT_CLINIC_OR_DEPARTMENT_OTHER): Payer: BLUE CROSS/BLUE SHIELD | Admitting: Family

## 2016-01-17 ENCOUNTER — Ambulatory Visit: Payer: BLUE CROSS/BLUE SHIELD

## 2016-01-17 ENCOUNTER — Other Ambulatory Visit (HOSPITAL_BASED_OUTPATIENT_CLINIC_OR_DEPARTMENT_OTHER): Payer: BLUE CROSS/BLUE SHIELD

## 2016-01-17 ENCOUNTER — Encounter: Payer: Self-pay | Admitting: Family

## 2016-01-17 VITALS — BP 99/57 | HR 101 | Temp 98.4°F | Resp 16 | Ht 68.0 in | Wt 185.0 lb

## 2016-01-17 DIAGNOSIS — D693 Immune thrombocytopenic purpura: Secondary | ICD-10-CM

## 2016-01-17 LAB — CBC WITH DIFFERENTIAL (CANCER CENTER ONLY)
BASO#: 0 10*3/uL (ref 0.0–0.2)
BASO%: 0.2 % (ref 0.0–2.0)
EOS%: 0.7 % (ref 0.0–7.0)
Eosinophils Absolute: 0.1 10*3/uL (ref 0.0–0.5)
HCT: 26.3 % — ABNORMAL LOW (ref 38.7–49.9)
HGB: 7.9 g/dL — ABNORMAL LOW (ref 13.0–17.1)
LYMPH#: 1.6 10*3/uL (ref 0.9–3.3)
LYMPH%: 18 % (ref 14.0–48.0)
MCH: 24.8 pg — ABNORMAL LOW (ref 28.0–33.4)
MCHC: 30 g/dL — ABNORMAL LOW (ref 32.0–35.9)
MCV: 82 fL (ref 82–98)
MONO#: 1.7 10*3/uL — ABNORMAL HIGH (ref 0.1–0.9)
MONO%: 19.1 % — AB (ref 0.0–13.0)
NEUT#: 5.6 10*3/uL (ref 1.5–6.5)
NEUT%: 62 % (ref 40.0–80.0)
RBC: 3.19 10*6/uL — ABNORMAL LOW (ref 4.20–5.70)
RDW: 17.9 % — AB (ref 11.1–15.7)
WBC: 9.1 10*3/uL (ref 4.0–10.0)

## 2016-01-17 LAB — TECHNOLOGIST REVIEW CHCC SATELLITE

## 2016-01-17 LAB — COMPREHENSIVE METABOLIC PANEL
ALT: 55 U/L (ref 0–55)
AST: 50 U/L — AB (ref 5–34)
Albumin: 2.7 g/dL — ABNORMAL LOW (ref 3.5–5.0)
Alkaline Phosphatase: 75 U/L (ref 40–150)
Anion Gap: 9 mEq/L (ref 3–11)
BUN: 16 mg/dL (ref 7.0–26.0)
CHLORIDE: 106 meq/L (ref 98–109)
CO2: 24 meq/L (ref 22–29)
CREATININE: 0.9 mg/dL (ref 0.7–1.3)
Calcium: 8.3 mg/dL — ABNORMAL LOW (ref 8.4–10.4)
EGFR: >90 mL/min/{1.73_m2} (ref >90)
Glucose: 115 mg/dl (ref 70–140)
Potassium: 4.1 mEq/L (ref 3.5–5.1)
SODIUM: 138 meq/L (ref 136–145)
Total Bilirubin: 0.72 mg/dL (ref 0.20–1.20)
Total Protein: 7.2 g/dL (ref 6.4–8.3)

## 2016-01-17 MED ORDER — ELTROMBOPAG OLAMINE 75 MG PO TABS: 75.0000 mg | ORAL_TABLET | Freq: Every day | ORAL | Status: AC

## 2016-01-17 NOTE — Progress Notes (Signed)
Hematology and Oncology Follow Up Visit  Scott Waters [id number] 03-Jan-1956 60 y.o. 01/17/2016   Principle Diagnosis:  Refractory immune thrombocytopenia - s/p splenectomy   Current Therapy:   IVIG as needed    Interim History:  Scott Waters is here today for for a follow-up. His platelet count is less than 6 today. The IVIG infusions have not been successful in bringing up his count. Scott Waters called and spoke with Scott Waters with Scott Waters and she suggested trying Promacta 75 mg daily along with Nplate at 10 mcg/kg. Scott Waters is now working on the approval process.  He denies having any recent episodes of bleeding. He went back to work on Monday and bumped his left shin yesterday leaving a good sized bruise. Pedal pulses are +2.  His Hgb is stable at 7.9 and an MCV of 82.  No fever, chills, n/v, cough, rash, dizziness, SOB, chest pain, palpitations, abdominal pain or changes in bowel or bladder habits.  No swelling, tenderness, numbness or tingling in his extremities at this time. No c/o joint aches or "bone" pain.  He continues to have a good appetite and is staying hydrated. His weight is stable.   Medications:    Medication List       This list is accurate as of: 01/17/16  1:42 PM.  Always use your most recent med list.               azithromycin 250 MG tablet  Commonly known as:  ZITHROMAX Z-PAK  Take as directed on package.     AZOR 10-40 MG tablet  Generic drug:  amLODipine-olmesartan  Take 1 tablet by mouth every morning.     CINNAMON PO  Take 2 capsules by mouth daily.     dexamethasone 4 MG tablet  Commonly known as:  DECADRON  Take 10 pills a day with food for 4 days     eltrombopag 75 MG tablet  Commonly known as:  PROMACTA  Take 1 tablet (75 mg total) by mouth daily. Take on an empty stomach 1 hour before meals or 2 hours after.     fexofenadine 180 MG tablet  Commonly known as:  ALLEGRA  Take 180 mg by mouth daily as needed for allergies.     fluticasone 50 MCG/ACT nasal spray  Commonly known as:  FLONASE  instill 2 sprays into each nostril once daily     folic acid 1 MG tablet  Commonly known as:  FOLVITE  Take 1 tablet (1 mg total) by mouth daily. 90 day supply.     multivitamin with minerals Tabs tablet  Take 1 tablet by mouth every morning.     pantoprazole 40 MG tablet  Commonly known as:  PROTONIX  Take 1 tablet (40 mg total) by mouth daily.     potassium chloride SA 20 MEQ tablet  Commonly known as:  K-DUR,KLOR-CON  Take 1 tablet (20 mEq total) by mouth daily.     potassium chloride 10 MEQ tablet  Commonly known as:  K-DUR,KLOR-CON  Take 2 tablets (20 mEq total) by mouth daily.     PROBIOTIC DAILY PO  Take 1 tablet by mouth 2 (two) times a week.     simvastatin 40 MG tablet  Commonly known as:  ZOCOR  Take 1 tablet (40 mg total) by mouth daily at 6 PM.        Allergies: No Known Allergies  Past Medical History, Surgical history, Social history, and Family History were reviewed  and updated.  Review of Systems: All other 10 point review of systems is negative.   Physical Exam:  height is 5\' 8"  (1.727 m) and weight is 185 lb (83.915 kg). His temperature is 98.4 F (36.9 C). His blood pressure is 99/57 and his pulse is 101. His respiration is 16.   Wt Readings from Last 3 Encounters:  01/17/16 185 lb (83.915 kg)  12/27/15 183 lb (83.008 kg)  12/15/15 189 lb (85.73 kg)    Ocular: Sclerae unicteric, pupils equal, round and reactive to light Ear-nose-throat: Oropharynx clear, dentition fair Lymphatic: No cervical supraclavicular or axillary adenopathy Lungs no rales or rhonchi, good excursion bilaterally, lung sounds clear throughout  Heart regular rate and rhythm, no murmur appreciated Abd soft, nontender, positive bowel sounds, no liver tip palpated on exam MSK no focal spinal tenderness, no joint edema Neuro: non-focal, well-oriented, appropriate affect Breasts: Deferred  Lab Results    Component Value Date   WBC 9.1 01/17/2016   HGB 7.9* 01/17/2016   HCT 26.3* 01/17/2016   MCV 82 01/17/2016   PLT <6* 01/17/2016   No results found for: FERRITIN, IRON, TIBC, UIBC, IRONPCTSAT Lab Results  Component Value Date   RETICCTPCT 2.6* 05/13/2014   RBC 3.19* 01/17/2016   RETICCTABS 91.0 05/13/2014   No results found for: Nils Pyle [facility] Lab Results  Component Value Date   IGGSERUM 2677* 12/11/2015   IGA 181 12/11/2015   IGMSERUM 26 12/11/2015   Lab Results  Component Value Date   TOTALPROTELP 7.2 12/11/2015     Chemistry      Component Value Date/Time   NA 137 12/16/2015 0431   NA 139 07/20/2014 0902   K 3.9 12/16/2015 0431   K 3.3 07/20/2014 0902   CL 107 12/16/2015 0431   CL 103 07/20/2014 0902   CO2 24 12/16/2015 0431   CO2 27 07/20/2014 0902   BUN 30* 12/16/2015 0431   BUN 11 07/20/2014 0902   CREATININE 0.90 12/16/2015 0431   CREATININE 0.9 07/20/2014 0902      Component Value Date/Time   CALCIUM 8.4* 12/16/2015 0431   CALCIUM 8.6 07/20/2014 0902   ALKPHOS 74 12/12/2015 0842   ALKPHOS 75 07/20/2014 0902   AST 49* 12/12/2015 0842   AST 51* 07/20/2014 0902   ALT 33 12/12/2015 0842   ALT 36 07/20/2014 0902   BILITOT 1.1 12/12/2015 0842   BILITOT 0.80 07/20/2014 0902     Impression and Plan: Scott Waters is a pleasant 60 yo gentleman with refractory immune thrombocytopenia. His platelet count continues to stay below 6 despite the IVIG infusions.  We will now try Promacta 75 mg daily and Nplate 10 mcg/kg. Scott Waters is currently working on getting this approved. A prescription for the Promacta was sent to his pharmacy.  We will hopefully be able to give him the Nplate on Monday. He has had some bruising but no episodes of bleeding in the last week or so.  We will continue to check his lab work weekly and plan to see him back in 3 weeks for labs and follow-up.  He knows to contact us with any questions or concerns. We can certainly see  her sooner if need be.   Eliezer Bottom, NP 2/22/20171:42 PM

## 2016-01-17 NOTE — Progress Notes (Signed)
No treatment today per Dr Ennever 

## 2016-01-18 ENCOUNTER — Telehealth: Payer: Self-pay | Admitting: Hematology & Oncology

## 2016-01-18 NOTE — Telephone Encounter (Signed)
Faxed medical records and completed form to: [facility] P: [id number] F: [phone]   Leave Id: [id number]  Leave Dates: 12/12/2015 - 01/12/2016  Retrn: 01/15/2016      COPY SCANNED

## 2016-01-18 NOTE — Telephone Encounter (Signed)
[facility]  Case Number: [id number] Status: Approved Dates: 12/19/2015 - 01/17/2019   PROMACTA TABLET       COPY SCANNED

## 2016-01-19 ENCOUNTER — Telehealth: Payer: Self-pay | Admitting: Hematology & Oncology

## 2016-01-19 NOTE — Telephone Encounter (Signed)
[facility]  Valid: 01/17/2016 - 05/15/2016 NPLATE Inravenous Req ID: [id number]   Member ID: [id number] Dob: [birth year] Dx: [id number]   [id number] F: [phone]   COPY SCANNED

## 2016-01-22 ENCOUNTER — Other Ambulatory Visit: Payer: Self-pay | Admitting: Family

## 2016-01-22 ENCOUNTER — Other Ambulatory Visit: Payer: BLUE CROSS/BLUE SHIELD

## 2016-01-22 ENCOUNTER — Telehealth: Payer: Self-pay | Admitting: *Deleted

## 2016-01-22 ENCOUNTER — Ambulatory Visit: Payer: BLUE CROSS/BLUE SHIELD

## 2016-01-22 ENCOUNTER — Other Ambulatory Visit (HOSPITAL_BASED_OUTPATIENT_CLINIC_OR_DEPARTMENT_OTHER): Payer: BLUE CROSS/BLUE SHIELD

## 2016-01-22 ENCOUNTER — Ambulatory Visit (HOSPITAL_BASED_OUTPATIENT_CLINIC_OR_DEPARTMENT_OTHER): Payer: BLUE CROSS/BLUE SHIELD | Admitting: Hematology & Oncology

## 2016-01-22 ENCOUNTER — Ambulatory Visit (HOSPITAL_BASED_OUTPATIENT_CLINIC_OR_DEPARTMENT_OTHER): Payer: BLUE CROSS/BLUE SHIELD

## 2016-01-22 ENCOUNTER — Other Ambulatory Visit: Payer: Self-pay | Admitting: Hematology & Oncology

## 2016-01-22 VITALS — BP 125/75 | HR 62 | Temp 98.2°F | Resp 18

## 2016-01-22 DIAGNOSIS — D693 Immune thrombocytopenic purpura: Secondary | ICD-10-CM

## 2016-01-22 DIAGNOSIS — D638 Anemia in other chronic diseases classified elsewhere: Secondary | ICD-10-CM

## 2016-01-22 LAB — CBC WITH DIFFERENTIAL (CANCER CENTER ONLY)
BASO#: 0.1 10*3/uL (ref 0.0–0.2)
BASO%: 0.5 % (ref 0.0–2.0)
EOS%: 0.3 % (ref 0.0–7.0)
Eosinophils Absolute: 0 10*3/uL (ref 0.0–0.5)
HEMATOCRIT: 25 % — AB (ref 38.7–49.9)
HEMOGLOBIN: 7.5 g/dL — AB (ref 13.0–17.1)
LYMPH#: 1.9 10*3/uL (ref 0.9–3.3)
LYMPH%: 17.7 % (ref 14.0–48.0)
MCH: 25.2 pg — ABNORMAL LOW (ref 28.0–33.4)
MCHC: 30 g/dL — ABNORMAL LOW (ref 32.0–35.9)
MCV: 84 fL (ref 82–98)
MONO#: 1.3 10*3/uL — AB (ref 0.1–0.9)
MONO%: 11.6 % (ref 0.0–13.0)
NEUT%: 69.9 % (ref 40.0–80.0)
NEUTROS ABS: 7.6 10*3/uL — AB (ref 1.5–6.5)
Platelets: <6 10*3/uL — CL (ref 145–400)
RBC: 2.98 10*6/uL — ABNORMAL LOW (ref 4.20–5.70)
RDW: 18.9 % — ABNORMAL HIGH (ref 11.1–15.7)
WBC: 10.9 10*3/uL — ABNORMAL HIGH (ref 4.0–10.0)

## 2016-01-22 LAB — COMPREHENSIVE METABOLIC PANEL
ALBUMIN: 2.8 g/dL — AB (ref 3.5–5.0)
ALK PHOS: 90 U/L (ref 40–150)
ALT: 82 U/L — AB (ref 0–55)
AST: 65 U/L — AB (ref 5–34)
Anion Gap: 9 mEq/L (ref 3–11)
BILIRUBIN TOTAL: 0.82 mg/dL (ref 0.20–1.20)
BUN: 19.5 mg/dL (ref 7.0–26.0)
CALCIUM: 8.4 mg/dL (ref 8.4–10.4)
CO2: 22 mEq/L (ref 22–29)
CREATININE: 1 mg/dL (ref 0.7–1.3)
Chloride: 108 mEq/L (ref 98–109)
EGFR: >90 mL/min/{1.73_m2} (ref >90)
Glucose: 103 mg/dl (ref 70–140)
Potassium: 4.1 mEq/L (ref 3.5–5.1)
Sodium: 140 mEq/L (ref 136–145)
Total Protein: 7 g/dL (ref 6.4–8.3)

## 2016-01-22 MED ORDER — AMINOCAPROIC ACID 500 MG PO TABS: 500.0000 mg | ORAL_TABLET | Freq: Three times a day (TID) | ORAL | Status: AC

## 2016-01-22 MED ORDER — ROMIPLOSTIM INJECTION 500 MCG
10.0000 ug/kg | Freq: Once | SUBCUTANEOUS | Status: AC
Start: 2016-01-22 — End: 2016-01-22
  Administered 2016-01-22: 840 ug via SUBCUTANEOUS
  Filled 2016-01-22: qty 1.68

## 2016-01-22 MED ORDER — DEXAMETHASONE 4 MG PO TABS: 40.0000 mg | ORAL_TABLET | Freq: Every day | ORAL | Status: AC

## 2016-01-22 NOTE — Telephone Encounter (Signed)
Critical Value Platelets <6 Dr [name] notified. Here for treatment. No additional orders

## 2016-01-22 NOTE — Patient Instructions (Signed)
Romiplostim injection What is this medicine? ROMIPLOSTIM (roe mi PLOE stim) helps your body make more platelets. This medicine is used to treat low platelets caused by chronic idiopathic thrombocytopenic purpura (ITP). This medicine may be used for other purposes; ask your health care provider or pharmacist if you have questions. What should I tell my health care provider before I take this medicine? They need to know if you have any of these conditions: -cancer or myelodysplastic syndrome -low blood counts, like low white cell, platelet, or red cell counts -take medicines that treat or prevent blood clots -an unusual or allergic reaction to romiplostim, mannitol, other medicines, foods, dyes, or preservatives -pregnant or trying to get pregnant -breast-feeding How should I use this medicine? This medicine is for injection under the skin. It is given by a health care professional in a hospital or clinic setting. A special MedGuide will be given to you before your injection. Read this information carefully each time. Talk to your pediatrician regarding the use of this medicine in children. Special care may be needed. Overdosage: If you think you have taken too much of this medicine contact a poison control center or emergency room at once. NOTE: This medicine is only for you. Do not share this medicine with others. What if I miss a dose? It is important not to miss your dose. Call your doctor or health care professional if you are unable to keep an appointment. What may interact with this medicine? Interactions are not expected. This list may not describe all possible interactions. Give your health care provider a list of all the medicines, herbs, non-prescription drugs, or dietary supplements you use. Also tell them if you smoke, drink alcohol, or use illegal drugs. Some items may interact with your medicine. What should I watch for while using this medicine? Your condition will be monitored  carefully while you are receiving this medicine. Visit your prescriber or health care professional for regular checks on your progress and for the needed blood tests. It is important to keep all appointments. What side effects may I notice from receiving this medicine? Side effects that you should report to your doctor or health care professional as soon as possible: -allergic reactions like skin rash, itching or hives, swelling of the face, lips, or tongue -shortness of breath, chest pain, swelling in a leg -unusual bleeding or bruising Side effects that usually do not require medical attention (report to your doctor or health care professional if they continue or are bothersome): -dizziness -headache -muscle aches -pain in arms and legs -stomach pain -trouble sleeping This list may not describe all possible side effects. Call your doctor for medical advice about side effects. You may report side effects to FDA at 1-800-FDA-1088. Where should I keep my medicine? This drug is given in a hospital or clinic and will not be stored at home. NOTE: This sheet is a summary. It may not cover all possible information. If you have questions about this medicine, talk to your doctor, pharmacist, or health care provider.    2016, Elsevier/Gold Standard. (2008-07-11 15:13:04)  

## 2016-01-22 NOTE — Progress Notes (Signed)
Hematology and Oncology Follow Up Visit  Scott Waters [id number] 1956-04-29 60 y.o. 01/22/2016   Principle Diagnosis:  Refractory immune thrombocytopenia - s/p splenectomy   Current Therapy:   Nplate 23mcg/kg sq q week Promacta 75mg  po q day Decadron 40 mg po q day x 4 days    Interim History:  Scott Waters is here today for for a follow-up. he is still having issues with refractory thrombocytopenia. His  platelet count today is less than 6K.   He's having no obvious bleeding.   We are trying everything that I can think of to get his platelet count back up. I spoke with Dr. Gaylyn Cheers,  At [facility]. She is an expert with ITP. She gave me some suggestions. One suggestion is combining both endplate and Promacta. He has not yet started the Promacta. We are trying to get this for him.   Again, his had no obvious bleeding. His hemoglobin is down.   His last bone marrow test was back in September. I think he probably needs a new one. Something must be going on with his bone marrow that we are just not seen right now. I will try to get this set up for next week.   He is still working. I think that he is at risk for bleeding at work. I don't think he really needs to be working. As such, I told him that we can get him out on total disability because of his refractory thrombocytopenia.   He probably needs to be transfused with 2 units of blood.   I want to start him on Amicar. With his significant thrombocytopenia, he is at risk for leading. I will like to start him on Amicar at 500 mg by mouth 3 times a day.   His appetite is okay. He's had no nausea or vomiting. He's had no change in bowel or bladder habits.   He's had a little bit of leg swelling.   He's had no joint issues.   Overall, his performance status is ECOG 1-2. Medications:    Medication List       This list is accurate as of: 01/22/16  5:59 PM.  Always use your most recent med list.               aminocaproic acid 500 MG  tablet  Commonly known as:  AMICAR  Take 1 tablet (500 mg total) by mouth every 8 (eight) hours.     azithromycin 250 MG tablet  Commonly known as:  ZITHROMAX Z-PAK  Take as directed on package.     AZOR 10-40 MG tablet  Generic drug:  amLODipine-olmesartan  Take 1 tablet by mouth every morning.     CINNAMON PO  Take 2 capsules by mouth daily.     dexamethasone 4 MG tablet  Commonly known as:  DECADRON  Take 10 tablets (40 mg total) by mouth daily.     eltrombopag 75 MG tablet  Commonly known as:  PROMACTA  Take 1 tablet (75 mg total) by mouth daily. Take on an empty stomach 1 hour before meals or 2 hours after.     fexofenadine 180 MG tablet  Commonly known as:  ALLEGRA  Take 180 mg by mouth daily as needed for allergies.     fluticasone 50 MCG/ACT nasal spray  Commonly known as:  FLONASE  instill 2 sprays into each nostril once daily     folic acid 1 MG tablet  Commonly known as:  [employer]  Take 1 tablet (1 mg total) by mouth daily. 90 day supply.     multivitamin with minerals Tabs tablet  Take 1 tablet by mouth every morning.     pantoprazole 40 MG tablet  Commonly known as:  PROTONIX  Take 1 tablet (40 mg total) by mouth daily.     potassium chloride SA 20 MEQ tablet  Commonly known as:  K-DUR,KLOR-CON  Take 1 tablet (20 mEq total) by mouth daily.     potassium chloride 10 MEQ tablet  Commonly known as:  K-DUR,KLOR-CON  Take 2 tablets (20 mEq total) by mouth daily.     PROBIOTIC DAILY PO  Take 1 tablet by mouth 2 (two) times a week.     simvastatin 40 MG tablet  Commonly known as:  ZOCOR  Take 1 tablet (40 mg total) by mouth daily at 6 PM.        Allergies: No Known Allergies  Past Medical History, Surgical history, Social history, and Family History were reviewed and updated.  Review of Systems: All other 10 point review of systems is negative.   Physical Exam:  vitals were not taken for this visit.  Wt Readings from Last 3 Encounters:    01/17/16 185 lb (83.915 kg)  12/27/15 183 lb (83.008 kg)  12/15/15 189 lb (85.73 kg)     head and neck exam shows no ocular or oral lesions. He has no intraoral hematomas or petechia. He has no adenopathy on his neck. Lungs are clear bilaterally. Cardiac exam regular rate and rhythm with no murmurs, rubs or bruits. Abdomen is soft. Bowel sounds are present. He has a laparoscopy scar that is well-healed. There is no plpable liver edge. Back exam shows no tenderness over the spine, ribs or hips. Extremities shows some 1+ edema in his lower legs. He has no venous cord in his legs. He has good pulses in his distal extremities. Skin exam shows some scattered ecchymoses. No petechia are noted.  Lab Results  Component Value Date   WBC 10.9* 01/22/2016   HGB 7.5* 01/22/2016   HCT 25.0* 01/22/2016   MCV 84 01/22/2016   PLT <6* 01/22/2016   No results found for: FERRITIN, IRON, TIBC, UIBC, IRONPCTSAT Lab Results  Component Value Date   RETICCTPCT 2.6* 05/13/2014   RBC 2.98* 01/22/2016   RETICCTABS 91.0 05/13/2014   No results found for: Nils Pyle [facility] Lab Results  Component Value Date   IGGSERUM 2677* 12/11/2015   IGA 181 12/11/2015   IGMSERUM 26 12/11/2015   Lab Results  Component Value Date   TOTALPROTELP 7.2 12/11/2015     Chemistry      Component Value Date/Time   NA 140 01/22/2016 1311   NA 137 12/16/2015 0431   NA 139 07/20/2014 0902   K 4.1 01/22/2016 1311   K 3.9 12/16/2015 0431   K 3.3 07/20/2014 0902   CL 107 12/16/2015 0431   CL 103 07/20/2014 0902   CO2 22 01/22/2016 1311   CO2 24 12/16/2015 0431   CO2 27 07/20/2014 0902   BUN 19.5 01/22/2016 1311   BUN 30* 12/16/2015 0431   BUN 11 07/20/2014 0902   CREATININE 1.0 01/22/2016 1311   CREATININE 0.90 12/16/2015 0431   CREATININE 0.9 07/20/2014 0902      Component Value Date/Time   CALCIUM 8.4 01/22/2016 1311   CALCIUM 8.4* 12/16/2015 0431   CALCIUM 8.6 07/20/2014 0902   ALKPHOS 90  01/22/2016 1311   ALKPHOS 74 12/12/2015 0842  ALKPHOS 75 07/20/2014 0902   AST 65* 01/22/2016 1311   AST 49* 12/12/2015 0842   AST 51* 07/20/2014 0902   ALT 82* 01/22/2016 1311   ALT 33 12/12/2015 0842   ALT 36 07/20/2014 0902   BILITOT 0.82 01/22/2016 1311   BILITOT 1.1 12/12/2015 0842   BILITOT 0.80 07/20/2014 0902     Impression and Plan: Scott Waters is a pleasant 60 yo gentleman with refractory immune thrombocytopenia.  Again, this is where the more difficult cases that we have seen. He truly is refractory. He's been through most standard front-line therapies. He has not yet had any actual chemotherapy. He's not yet had Campath. I suppose we could use these if needed.   He is already had Rituxan. I suppose another course of Rituxan could be considered.    we will see about a bone marrow test for him. I will have radiology do this for [place].    I feel bad for him. He is doing all that we've asked him to do. So far, nothing just has worked well.    I will have to keep get him coming back weekly for lab work.   I spent about 35 minutes with him today.   [name], MD 2/27/20175:59 PM

## 2016-01-23 ENCOUNTER — Ambulatory Visit (HOSPITAL_BASED_OUTPATIENT_CLINIC_OR_DEPARTMENT_OTHER): Payer: BLUE CROSS/BLUE SHIELD

## 2016-01-23 VITALS — BP 131/81 | HR 90 | Temp 97.7°F | Resp 20

## 2016-01-23 DIAGNOSIS — D693 Immune thrombocytopenic purpura: Secondary | ICD-10-CM

## 2016-01-23 DIAGNOSIS — D638 Anemia in other chronic diseases classified elsewhere: Secondary | ICD-10-CM | POA: Diagnosis not present

## 2016-01-23 LAB — PREPARE RBC (CROSSMATCH)

## 2016-01-23 MED ORDER — DIPHENHYDRAMINE HCL 25 MG PO CAPS
25.0000 mg | ORAL_CAPSULE | Freq: Once | ORAL | Status: AC
  Administered 2016-01-23: 25 mg via ORAL

## 2016-01-23 MED ORDER — ACETAMINOPHEN 325 MG PO TABS
650.0000 mg | ORAL_TABLET | Freq: Once | ORAL | Status: AC
  Administered 2016-01-23: 650 mg via ORAL

## 2016-01-23 MED ORDER — SODIUM CHLORIDE 0.9 % IV SOLN
250.0000 mL | Freq: Once | INTRAVENOUS | Status: AC
  Administered 2016-01-23: 250 mL via INTRAVENOUS

## 2016-01-23 MED ORDER — DIPHENHYDRAMINE HCL 25 MG PO CAPS
ORAL_CAPSULE | ORAL | Status: AC
  Filled 2016-01-23: qty 1

## 2016-01-23 MED ORDER — FUROSEMIDE 10 MG/ML IJ SOLN
INTRAMUSCULAR | Status: AC
  Filled 2016-01-23: qty 4

## 2016-01-23 MED ORDER — FUROSEMIDE 10 MG/ML IJ SOLN
20.0000 mg | Freq: Once | INTRAMUSCULAR | Status: AC
  Administered 2016-01-23: 20 mg via INTRAVENOUS

## 2016-01-23 MED ORDER — ACETAMINOPHEN 325 MG PO TABS
ORAL_TABLET | ORAL | Status: AC
  Filled 2016-01-23: qty 2

## 2016-01-23 NOTE — Patient Instructions (Signed)

## 2016-01-24 LAB — TYPE AND SCREEN
ABO/RH(D): A POS
Antibody Screen: POSITIVE
DAT, IgG: POSITIVE
Unit division: 0
Unit division: 0

## 2016-01-25 ENCOUNTER — Encounter: Payer: Self-pay | Admitting: Hematology & Oncology

## 2016-01-26 ENCOUNTER — Other Ambulatory Visit: Payer: Self-pay | Admitting: Radiology

## 2016-01-29 ENCOUNTER — Encounter (HOSPITAL_COMMUNITY): Payer: Self-pay

## 2016-01-29 ENCOUNTER — Ambulatory Visit (HOSPITAL_COMMUNITY)
Admission: RE | Admit: 2016-01-29 | Discharge: 2016-01-29 | Disposition: A | Discharge status: Home / Self Care | Payer: BLUE CROSS/BLUE SHIELD | Source: Ambulatory Visit | Attending: Hematology & Oncology | Admitting: Hematology & Oncology

## 2016-01-29 DIAGNOSIS — Z8249 Family history of ischemic heart disease and other diseases of the circulatory system: Secondary | ICD-10-CM | POA: Diagnosis not present

## 2016-01-29 DIAGNOSIS — Z87891 Personal history of nicotine dependence: Secondary | ICD-10-CM | POA: Diagnosis not present

## 2016-01-29 DIAGNOSIS — D649 Anemia, unspecified: Secondary | ICD-10-CM | POA: Insufficient documentation

## 2016-01-29 DIAGNOSIS — Z9081 Acquired absence of spleen: Secondary | ICD-10-CM | POA: Insufficient documentation

## 2016-01-29 DIAGNOSIS — Z79899 Other long term (current) drug therapy: Secondary | ICD-10-CM | POA: Insufficient documentation

## 2016-01-29 DIAGNOSIS — Z833 Family history of diabetes mellitus: Secondary | ICD-10-CM | POA: Insufficient documentation

## 2016-01-29 DIAGNOSIS — D693 Immune thrombocytopenic purpura: Secondary | ICD-10-CM | POA: Diagnosis present

## 2016-01-29 LAB — CBC WITH DIFFERENTIAL/PLATELET
BASOS ABS: 0 10*3/uL (ref 0.0–0.1)
BASOS PCT: 0 %
Band Neutrophils: 0 %
Blasts: 0 %
EOS ABS: 0.1 10*3/uL (ref 0.0–0.7)
Eosinophils Relative: 1 %
HCT: 33.2 % — ABNORMAL LOW (ref 39.0–52.0)
Hemoglobin: 9.9 g/dL — ABNORMAL LOW (ref 13.0–17.0)
Lymphocytes Relative: 15 %
Lymphs Abs: 1.4 10*3/uL (ref 0.7–4.0)
MCH: 25.2 pg — ABNORMAL LOW (ref 26.0–34.0)
MCHC: 29.8 g/dL — ABNORMAL LOW (ref 30.0–36.0)
MCV: 84.5 fL (ref 78.0–100.0)
METAMYELOCYTES PCT: 0 %
MONO ABS: 0.5 10*3/uL (ref 0.1–1.0)
MYELOCYTES: 0 %
Monocytes Relative: 6 %
NEUTROS PCT: 78 %
NRBC: 0 /100{WBCs}
Neutro Abs: 7 10*3/uL (ref 1.7–7.7)
Other: 0 %
PLATELETS: 115 10*3/uL — AB (ref 150–400)
PROMYELOCYTES ABS: 0 %
RBC: 3.93 MIL/uL — ABNORMAL LOW (ref 4.22–5.81)
RDW: 18.3 % — ABNORMAL HIGH (ref 11.5–15.5)
WBC: 9 10*3/uL (ref 4.0–10.5)

## 2016-01-29 LAB — BONE MARROW EXAM

## 2016-01-29 LAB — PROTIME-INR
INR: 1.27 (ref 0.00–1.49)
PROTHROMBIN TIME: 16 s — AB (ref 11.6–15.2)

## 2016-01-29 MED ORDER — MIDAZOLAM HCL 2 MG/2ML IJ SOLN
INTRAMUSCULAR | Status: AC
  Filled 2016-01-29: qty 6

## 2016-01-29 MED ORDER — FENTANYL CITRATE (PF) 100 MCG/2ML IJ SOLN
INTRAMUSCULAR | Status: AC | PRN
  Administered 2016-01-29: 50 ug via INTRAVENOUS

## 2016-01-29 MED ORDER — SODIUM CHLORIDE 0.9 % IV SOLN
INTRAVENOUS | Status: DC
  Administered 2016-01-29: 07:00:00 via INTRAVENOUS

## 2016-01-29 MED ORDER — MIDAZOLAM HCL 2 MG/2ML IJ SOLN
INTRAMUSCULAR | Status: AC | PRN
  Administered 2016-01-29 (×2): 1 mg via INTRAVENOUS
  Administered 2016-01-29: 0.5 mg via INTRAVENOUS

## 2016-01-29 MED ORDER — FENTANYL CITRATE (PF) 100 MCG/2ML IJ SOLN
INTRAMUSCULAR | Status: AC
  Filled 2016-01-29: qty 4

## 2016-01-29 NOTE — Procedures (Signed)
Interventional Radiology Procedure Note  Procedure: CT guided aspirate and core biopsy of right posterior iliac bone Complications: None Recommendations: - Bedrest supine  - OTC's for prn Pain control - Follow biopsy results  Signed,  Dulcy Fanny. Earleen Newport, DO

## 2016-01-29 NOTE — H&P (Signed)
Chief Complaint: Patient was seen in consultation today for No chief complaint on file.  at the request of Ennever,Peter R  Referring Physician(s): Burney Gauze R  Supervising Physician: Corrie Mckusick  History of Present Illness: Scott Waters is a 60 y.o. male with refractory immune thrombocytopenia - s/p splenectomy. He continues to have profound thrombocytopenia He is referred for repeat bone marrow biopsy He has had 2 prior, no complications. Feels well, no c/o this am. Has been NPO x meds.  History reviewed. No pertinent past medical history.  Past Surgical History  Procedure Laterality Date  . Polypectomy    . Multiple tooth extractions  2014    "took out all the top teeth"  . Tonsillectomy and adenoidectomy  1973  . Laceration repair Right ~ 1983    "inner knee; steel tubing fell on me"  . Shoulder arthroscopy Left 08/2001    "removed bone spur"  . Laparoscopic splenectomy N/A 11/08/2014    Procedure: LAPAROSCOPIC SPLENECTOMY;  Surgeon: Fanny Skates, MD;  Location: [facility];  Service: General;  Laterality: N/A;  . Laparotomy N/A 11/14/2014    Procedure: EXPLORATORY LAPAROTOMY, RELEASE SMALL BOWEL OBSTRUCTION, INCISIONAL HERNIA REPAIR;  Surgeon: Fanny Skates, MD;  Location: [facility];  Service: General;  Laterality: N/A;    Allergies: Review of patient's allergies indicates no known allergies.  Medications: Prior to Admission medications   Medication Sig Start Date End Date Taking? Authorizing Provider  aminocaproic acid (AMICAR) 500 MG tablet Take 1 tablet (500 mg total) by mouth every 8 (eight) hours. 01/22/16   Eliezer Bottom, NP  amLODipine-olmesartan (AZOR) 10-40 MG per tablet Take 1 tablet by mouth every morning.     Historical Provider, MD  azithromycin (ZITHROMAX Z-PAK) 250 MG tablet Take as directed on package. 12/27/15   Eliezer Bottom, NP  CINNAMON PO Take 2 capsules by mouth daily.    Historical Provider, MD  dexamethasone (DECADRON) 4 MG  tablet Take 10 tablets (40 mg total) by mouth daily. 01/22/16   Eliezer Bottom, NP  eltrombopag (PROMACTA) 75 MG tablet Take 1 tablet (75 mg total) by mouth daily. Take on an empty stomach 1 hour before meals or 2 hours after. 01/17/16   Eliezer Bottom, NP  fexofenadine (ALLEGRA) 180 MG tablet Take 180 mg by mouth daily as needed for allergies.     Historical Provider, MD  fluticasone (FLONASE) 50 MCG/ACT nasal spray instill 2 sprays into each nostril once daily Patient taking differently: instill 2 sprays into each nostril daily as needed for allergies 11/24/14   Burnis Medin, MD  folic acid (FOLVITE) 1 MG tablet Take 1 tablet (1 mg total) by mouth daily. 90 day supply. 01/03/16   [name], MD  Multiple Vitamin (MULTIVITAMIN WITH MINERALS) TABS tablet Take 1 tablet by mouth every morning.     Historical Provider, MD  pantoprazole (PROTONIX) 40 MG tablet Take 1 tablet (40 mg total) by mouth daily. 05/23/14   [name], MD  potassium chloride (K-DUR,KLOR-CON) 10 MEQ tablet Take 2 tablets (20 mEq total) by mouth daily. 01/04/16   Burnis Medin, MD  potassium chloride SA (K-DUR,KLOR-CON) 20 MEQ tablet Take 1 tablet (20 mEq total) by mouth daily. 06/02/14 01/03/17  Eliezer Bottom, NP  Probiotic Product (PROBIOTIC DAILY PO) Take 1 tablet by mouth 2 (two) times a week.     Historical Provider, MD  simvastatin (ZOCOR) 40 MG tablet Take 1 tablet (40 mg total) by mouth daily at  6 PM. 01/04/16   Burnis Medin, MD     Family History  Problem Relation Age of Onset  . Colon cancer Father 2    Died at 28  . Hypertension Mother   . Diabetes Mother   . Kidney disease Mother     diailysis from dm   . Stroke      Social History   Social History  . Marital Status: Divorced    Spouse Name: N/A  . Number of Children: N/A  . Years of Education: N/A   Social History Main Topics  . Smoking status: Former Smoker -- 1.00 packs/day for 33 years    Types: Cigarettes    Start date: 10/02/1970     Quit date: 01/02/2003  . Smokeless tobacco: Former Systems developer    Types: Snuff, Sarina Ser    Quit date: 10/03/2003     Comment: quit 11 years ago  . Alcohol Use: 16.8 oz/week    14 Cans of beer, 14 Shots of liquor per week     Comment: 2 beers daily, 2 shots liquor daily   . Drug Use: Yes     Comment: "last drug use was in ~ 1995; marijuana"  . Sexual Activity: Yes   Other Topics Concern  . 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  All other systems are negative.  Review of Systems  Vital Signs: There were no vitals taken for this visit.  Physical Exam  Constitutional: He is oriented to person, place, and time. He appears well-developed. No distress.  HENT:  Head: Normocephalic.  Mouth/Throat: Oropharynx is clear and moist.  Neck: No tracheal deviation present.  Cardiovascular: Normal rate, regular rhythm and normal heart sounds.   Pulmonary/Chest: Effort normal and breath sounds normal. No respiratory distress.  Neurological: He is alert and oriented to person, place, and time.  Psychiatric: He has a normal mood and affect.    Mallampati Score:  MD Evaluation Airway: WNL Heart: WNL Abdomen: WNL Chest/ Lungs: WNL ASA  Classification: 2 Mallampati/Airway Score: One  Imaging: No results found.  Labs:  CBC:  Recent Labs  01/08/16 1429 01/17/16 0959 01/22/16 1311 01/29/16 0720  WBC 6.5 9.1 10.9* 9.0  HGB 7.7* 7.9* 7.5* 9.9*  HCT 25.1* 26.3* 25.0* 33.2*  PLT <6* <6* <6* 115*    COAGS:  Recent Labs  08/13/15 0301 12/11/15 2245 12/12/15 0842 01/29/16 0720  INR 1.07 1.17 1.18 1.27  APTT  --  28  --   --     BMP:  Recent Labs  12/12/15 0842 12/14/15 0353 12/15/15 0415 12/16/15 0431  01/17/16 1000 01/22/16 1311  NA 137 139 139 137 138 140  K 4.7 3.9 3.8 3.9 4.1 4.1  CL 106 109 108 107  --   --   CO2 _0 GLUCOSE 140* 179* 116* 154* 115 103  BUN 20 29* 30* 30* 16.0 19.5  CALCIUM 9.1 8.4* 8.1* 8.4* 8.3* 8.4  CREATININE 0.86 0.87 0.78 0.90 0.9 1.0  GFRNONAA >60 >60 >60 >60  --   --   GFRAA >60 >60 >60 >60  --   --  LIVER FUNCTION TESTS:  Recent Labs  12/11/15 1422 12/12/15 0842 01/17/16 1000 01/22/16 1311  BILITOT 1.3* 1.1 0.72 0.82  AST 47* 49* 50* 65*  ALT 31 33 55 82*  ALKPHOS 82 74 75 90  PROT 7.6 7.6 7.2 7.0  ALBUMIN 3.6 3.4* 2.7* 2.8*    TUMOR MARKERS: No results for input(s): AFPTM, CEA, CA199, CHROMGRNA in the last 8760 hours.  Assessment and Plan: Refractory immune thrombocytopenia - s/p splenectomy  For another CT guided BM bx today Labs reviewed. Risks and Benefits discussed with the patient including, but not limited to bleeding, infection, damage to adjacent structures or low yield requiring additional tests. All of the patient's questions were answered, patient is agreeable to proceed. Consent signed and in chart.    Thank you for this interesting consult.    A copy of this report was sent to the requesting provider on this date.  Electronically Signed: Ascencion Dike 01/29/2016, 8:39 AM   I spent a total of 20 minutesin face to face in clinical consultation, greater than 50% of which was counseling/coordinating care for bone marrow biopsy

## 2016-01-29 NOTE — Discharge Instructions (Signed)
Bone Marrow Aspiration and Bone Marrow Biopsy °Bone marrow aspiration and bone marrow biopsy are procedures that are done to diagnose blood disorders. You may also have one of these procedures to help diagnose infections or some types of cancer. °Bone marrow is the soft tissue that is inside your bones. Blood cells are produced in bone marrow. For bone marrow aspiration, a sample of tissue in liquid form is removed from inside your bone. For a bone marrow biopsy, a small core of bone marrow tissue is removed. Then these samples are examined under a microscope or tested in a lab. °You may need these procedures if you have an abnormal complete blood count (CBC). The aspiration or biopsy sample is usually taken from the top of your hip bone. Sometimes, an aspiration sample is taken from your chest bone (sternum). °LET YOUR HEALTH CARE PROVIDER KNOW ABOUT: °· Any allergies you have. °· All medicines you are taking, including vitamins, herbs, eye drops, creams, and over-the-counter medicines. °· Previous problems you or members of your family have had with the use of anesthetics. °· Any blood disorders you have. °· Previous surgeries you have had. °· Any medical conditions you may have. °· Whether you are pregnant or you think that you may be pregnant. °RISKS AND COMPLICATIONS °Generally, this is a safe procedure. However, problems may occur, including: °· Infection. °· Bleeding. °BEFORE THE PROCEDURE °· Ask your health care provider about: °¨ Changing or stopping your regular medicines. This is especially important if you are taking diabetes medicines or blood thinners. °¨ Taking medicines such as aspirin and ibuprofen. These medicines can thin your blood. Do not take these medicines before your procedure if your health care provider instructs you not to. °· Plan to have someone take you home after the procedure. °· If you go home right after the procedure, plan to have someone with you for 24 hours. °PROCEDURE  °· An  IV tube may be inserted into one of your veins. °· The injection site will be cleaned with a germ-killing solution (antiseptic). °· You will be given one or more of the following: °¨ A medicine that helps you relax (sedative). °¨ A medicine that numbs the area (local anesthetic). °· The bone marrow sample will be removed as follows: °¨ For an aspiration, a hollow needle will be inserted through your skin and into your bone. Bone marrow fluid will be drawn up into a syringe. °¨ For a biopsy, your health care provider will use a hollow needle to remove a core of tissue from your bone marrow. °· The needle will be removed. °· A bandage (dressing) will be placed over the insertion site and taped in place. °The procedure may vary among health care providers and hospitals. °AFTER THE PROCEDURE °· Your blood pressure, heart rate, breathing rate, and blood oxygen level will be monitored often until the medicines you were given have worn off. °· Return to your normal activities as directed by your health care provider. °  °This information is not intended to replace advice given to you by your health care provider. Make sure you discuss any questions you have with your health care provider. °  °Document Released: 11/14/2004 Document Revised: 03/28/2015 Document Reviewed: 11/02/2014 °Elsevier Interactive Patient Education ©2016 [employer] ° °Bone Marrow Aspiration and Bone Marrow Biopsy, Care After °Refer to this sheet in the next few weeks. These instructions provide you with information about caring for yourself after your procedure. Your health care provider may also give   you more specific instructions. Your treatment has been planned according to current medical practices, but problems sometimes occur. Call your health care provider if you have any problems or questions after your procedure. °WHAT TO EXPECT AFTER THE PROCEDURE °After your procedure, it is common to have: °· Soreness or tenderness around the puncture  site. °· Bruising. °HOME CARE INSTRUCTIONS °· Take medicines only as directed by your health care provider. °· Follow your health care provider's instructions about: °¨ Puncture site care. °¨ Bandage (dressing) changes and removal. °· Bathe and shower as directed by your health care provider. °· Check your puncture site every day for signs of infection. Watch for: °¨ Redness, swelling, or pain. °¨ Fluid, blood, or pus. °· Return to your normal activities as directed by your health care provider. °· Keep all follow-up visits as directed by your health care provider. This is important. °SEEK MEDICAL CARE IF: °· You have a fever. °· You have uncontrollable bleeding. °· You have redness, swelling, or pain at the site of your puncture. °· You have fluid, blood, or pus coming from your puncture site. °  °This information is not intended to replace advice given to you by your health care provider. Make sure you discuss any questions you have with your health care provider. °  °Document Released: 05/31/2005 Document Revised: 03/28/2015 Document Reviewed: 11/02/2014 °Elsevier Interactive Patient Education ©2016 [employer] ° °Moderate Conscious Sedation, Adult, Care After °Refer to this sheet in the next few weeks. These instructions provide you with information on caring for yourself after your procedure. Your health care provider may also give you more specific instructions. Your treatment has been planned according to current medical practices, but problems sometimes occur. Call your health care provider if you have any problems or questions after your procedure. °WHAT TO EXPECT AFTER THE PROCEDURE  °After your procedure: °· You may feel sleepy, clumsy, and have poor balance for several hours. °· Vomiting may occur if you eat too soon after the procedure. °HOME CARE INSTRUCTIONS °· Do not participate in any activities where you could become injured for at least 24 hours. Do not: °¨ Drive. °¨ Swim. °¨ Ride a  bicycle. °¨ Operate heavy machinery. °¨ Cook. °¨ Use power tools. °¨ Climb ladders. °¨ Work from a high place. °· Do not make important decisions or sign legal documents until you are improved. °· If you vomit, drink water, juice, or soup when you can drink without vomiting. Make sure you have little or no nausea before eating solid foods. °· Only take over-the-counter or prescription medicines for pain, discomfort, or fever as directed by your health care provider. °· Make sure you and your family fully understand everything about the medicines given to you, including what side effects may occur. °· You should not drink alcohol, take sleeping pills, or take medicines that cause drowsiness for at least 24 hours. °· If you smoke, do not smoke without supervision. °· If you are feeling better, you may resume normal activities 24 hours after you were sedated. °· Keep all appointments with your health care provider. °SEEK MEDICAL CARE IF: °· Your skin is pale or bluish in color. °· You continue to feel nauseous or vomit. °· Your pain is getting worse and is not helped by medicine. °· You have bleeding or swelling. °· You are still sleepy or feeling clumsy after 24 hours. °SEEK IMMEDIATE MEDICAL CARE IF: °· You develop a rash. °· You have difficulty breathing. °· You develop any   type of allergic problem. °· You have a fever. °MAKE SURE YOU: °· Understand these instructions. °· Will watch your condition. °· Will get help right away if you are not doing well or get worse. °  °This information is not intended to replace advice given to you by your health care provider. Make sure you discuss any questions you have with your health care provider. °  °Document Released: 09/01/2013 Document Revised: 12/02/2014 Document Reviewed: 09/01/2013 °Elsevier Interactive Patient Education ©2016 [employer] ° °

## 2016-01-30 ENCOUNTER — Other Ambulatory Visit: Payer: BLUE CROSS/BLUE SHIELD

## 2016-01-30 ENCOUNTER — Encounter: Payer: Self-pay | Admitting: Nurse Practitioner

## 2016-01-30 ENCOUNTER — Ambulatory Visit (HOSPITAL_BASED_OUTPATIENT_CLINIC_OR_DEPARTMENT_OTHER): Payer: BLUE CROSS/BLUE SHIELD

## 2016-01-30 VITALS — BP 129/84 | HR 98 | Temp 97.9°F | Resp 16

## 2016-01-30 DIAGNOSIS — D693 Immune thrombocytopenic purpura: Secondary | ICD-10-CM

## 2016-01-30 MED ORDER — ROMIPLOSTIM INJECTION 500 MCG
10.0000 ug/kg | Freq: Once | SUBCUTANEOUS | Status: AC
  Administered 2016-01-30: 840 ug via SUBCUTANEOUS
  Filled 2016-01-30: qty 1.68

## 2016-01-30 NOTE — Patient Instructions (Signed)
Romiplostim injection What is this medicine? ROMIPLOSTIM (roe mi PLOE stim) helps your body make more platelets. This medicine is used to treat low platelets caused by chronic idiopathic thrombocytopenic purpura (ITP). This medicine may be used for other purposes; ask your health care provider or pharmacist if you have questions. What should I tell my health care provider before I take this medicine? They need to know if you have any of these conditions: -cancer or myelodysplastic syndrome -low blood counts, like low white cell, platelet, or red cell counts -take medicines that treat or prevent blood clots -an unusual or allergic reaction to romiplostim, mannitol, other medicines, foods, dyes, or preservatives -pregnant or trying to get pregnant -breast-feeding How should I use this medicine? This medicine is for injection under the skin. It is given by a health care professional in a hospital or clinic setting. A special MedGuide will be given to you before your injection. Read this information carefully each time. Talk to your pediatrician regarding the use of this medicine in children. Special care may be needed. Overdosage: If you think you have taken too much of this medicine contact a poison control center or emergency room at once. NOTE: This medicine is only for you. Do not share this medicine with others. What if I miss a dose? It is important not to miss your dose. Call your doctor or health care professional if you are unable to keep an appointment. What may interact with this medicine? Interactions are not expected. This list may not describe all possible interactions. Give your health care provider a list of all the medicines, herbs, non-prescription drugs, or dietary supplements you use. Also tell them if you smoke, drink alcohol, or use illegal drugs. Some items may interact with your medicine. What should I watch for while using this medicine? Your condition will be monitored  carefully while you are receiving this medicine. Visit your prescriber or health care professional for regular checks on your progress and for the needed blood tests. It is important to keep all appointments. What side effects may I notice from receiving this medicine? Side effects that you should report to your doctor or health care professional as soon as possible: -allergic reactions like skin rash, itching or hives, swelling of the face, lips, or tongue -shortness of breath, chest pain, swelling in a leg -unusual bleeding or bruising Side effects that usually do not require medical attention (report to your doctor or health care professional if they continue or are bothersome): -dizziness -headache -muscle aches -pain in arms and legs -stomach pain -trouble sleeping This list may not describe all possible side effects. Call your doctor for medical advice about side effects. You may report side effects to FDA at 1-800-FDA-1088. Where should I keep my medicine? This drug is given in a hospital or clinic and will not be stored at home. NOTE: This sheet is a summary. It may not cover all possible information. If you have questions about this medicine, talk to your doctor, pharmacist, or health care provider.    2016, Elsevier/Gold Standard. (2008-07-11 15:13:04)  

## 2016-01-30 NOTE — Progress Notes (Signed)
Received via fax leave verification form (1 page) from [employer]. Dr. [name] signed and some information documented. Form given to Baxter Flattery for further completion.

## 2016-02-06 LAB — CHROMOSOME ANALYSIS, BONE MARROW

## 2016-02-07 ENCOUNTER — Other Ambulatory Visit: Payer: BLUE CROSS/BLUE SHIELD

## 2016-02-07 ENCOUNTER — Encounter: Payer: Self-pay | Admitting: Hematology & Oncology

## 2016-02-07 ENCOUNTER — Ambulatory Visit: Payer: BLUE CROSS/BLUE SHIELD

## 2016-02-07 ENCOUNTER — Ambulatory Visit: Payer: BLUE CROSS/BLUE SHIELD | Admitting: Family

## 2016-02-08 ENCOUNTER — Telehealth: Payer: Self-pay | Admitting: Family

## 2016-02-08 ENCOUNTER — Telehealth: Payer: Self-pay | Admitting: *Deleted

## 2016-02-08 NOTE — Telephone Encounter (Signed)
Patient called office on 07-Mar-2016 at approx 3:30p stating he didn't feel good. He stated he couldn't get relief and that "my body is overwhelmed". When probed he couldn't identify any specific symptom. He stated "I can't put it into words".   Of note, he had an appointment at 3:15p and hadn't showed. When asked he stated "I was feeling bad and I didn't know what to do". Spoke to Dr [name] and he wants patient to come into office to be seen, even though he would be late to his appointment. He said he would come in, but that he was currently in [place] and that he would be about 45 minutes from arrival.   At 4:30p patient still hadn't arrived to the office. Office called patient and he answered. He was asked how close he was to the office and he stated he hadn't yet left and was still 45 minutes out. After speaking to Dr [name] again, patient was told to go to the emergency room for evaluation. Patient verbalized understanding and stated he would proceed to [facility] for evaluation.

## 2016-02-08 NOTE — Telephone Encounter (Signed)
I spoke with Scott Waters yesterday evening. He states that he "did not feel right" and was not able to come to his appointment with our office earlier in the day. He sounded to be SOB and I advised him to go directly to the ED. He stated that he would call his sister and have her take him. I told him if he was unable to contact his sister to call 911. He stated that he would do that as soon as we got off the phone.

## 2016-02-12 ENCOUNTER — Encounter: Payer: Self-pay | Admitting: Hematology & Oncology

## 2016-02-14 ENCOUNTER — Other Ambulatory Visit: Payer: BLUE CROSS/BLUE SHIELD

## 2016-02-14 ENCOUNTER — Ambulatory Visit: Payer: BLUE CROSS/BLUE SHIELD

## 2016-02-21 ENCOUNTER — Ambulatory Visit: Payer: BLUE CROSS/BLUE SHIELD

## 2016-02-21 ENCOUNTER — Other Ambulatory Visit: Payer: BLUE CROSS/BLUE SHIELD

## 2016-02-21 ENCOUNTER — Encounter (HOSPITAL_COMMUNITY): Payer: Self-pay

## 2016-02-24 DEATH — deceased

## 2016-02-28 ENCOUNTER — Other Ambulatory Visit: Payer: BLUE CROSS/BLUE SHIELD

## 2016-02-28 ENCOUNTER — Ambulatory Visit: Payer: BLUE CROSS/BLUE SHIELD

## 2016-03-06 ENCOUNTER — Other Ambulatory Visit: Payer: BLUE CROSS/BLUE SHIELD

## 2016-03-06 ENCOUNTER — Ambulatory Visit: Payer: BLUE CROSS/BLUE SHIELD

## 2016-03-06 ENCOUNTER — Ambulatory Visit: Payer: BLUE CROSS/BLUE SHIELD | Admitting: Hematology & Oncology

## 2016-03-13 ENCOUNTER — Ambulatory Visit: Payer: BLUE CROSS/BLUE SHIELD

## 2016-03-13 ENCOUNTER — Other Ambulatory Visit: Payer: BLUE CROSS/BLUE SHIELD

## 2016-03-20 ENCOUNTER — Other Ambulatory Visit: Payer: BLUE CROSS/BLUE SHIELD

## 2016-03-20 ENCOUNTER — Ambulatory Visit: Payer: BLUE CROSS/BLUE SHIELD

## 2016-03-27 ENCOUNTER — Ambulatory Visit: Payer: BLUE CROSS/BLUE SHIELD

## 2016-03-27 ENCOUNTER — Other Ambulatory Visit: Payer: BLUE CROSS/BLUE SHIELD

## 2016-04-03 ENCOUNTER — Ambulatory Visit: Payer: BLUE CROSS/BLUE SHIELD

## 2016-04-03 ENCOUNTER — Ambulatory Visit: Payer: BLUE CROSS/BLUE SHIELD | Admitting: Hematology & Oncology

## 2016-04-03 ENCOUNTER — Other Ambulatory Visit: Payer: BLUE CROSS/BLUE SHIELD

## 2016-05-06 ENCOUNTER — Other Ambulatory Visit: Payer: Self-pay

## 2016-05-10 NOTE — Progress Notes (Signed)
Document opened and reviewed for wellness visit . No showed . See notes    Patient is deceased and  I was never notified until this week

## 2016-05-13 ENCOUNTER — Encounter: Payer: Self-pay | Admitting: Internal Medicine

## 2016-05-13 ENCOUNTER — Telehealth: Payer: Self-pay | Admitting: Family Medicine

## 2016-05-13 DIAGNOSIS — Z0289 Encounter for other administrative examinations: Secondary | ICD-10-CM

## 2016-05-13 NOTE — Telephone Encounter (Signed)
Pt missed appt today at 11:15.  Left a message for him to reschedule.

## 2016-11-02 ENCOUNTER — Other Ambulatory Visit: Payer: Self-pay | Admitting: Nurse Practitioner
